# Patient Record
Sex: Female | Born: 1940 | Race: White | Hispanic: No | Smoking: Never smoker
Health system: Southern US, Community
[De-identification: ages and names within clinical notes are randomized; demographics above are authoritative.]

## PROBLEM LIST (undated history)

## (undated) DIAGNOSIS — E119 Type 2 diabetes mellitus without complications: Secondary | ICD-10-CM

## (undated) DIAGNOSIS — E785 Hyperlipidemia, unspecified: Secondary | ICD-10-CM

## (undated) DIAGNOSIS — I10 Essential (primary) hypertension: Secondary | ICD-10-CM

## (undated) DIAGNOSIS — I4891 Unspecified atrial fibrillation: Secondary | ICD-10-CM

---

## 1998-09-08 ENCOUNTER — Ambulatory Visit (HOSPITAL_COMMUNITY): Admission: RE | Admit: 1998-09-08 | Discharge: 1998-09-08 | Payer: Self-pay | Admitting: Gynecology

## 1998-09-08 ENCOUNTER — Other Ambulatory Visit: Admission: RE | Admit: 1998-09-08 | Discharge: 1998-09-08 | Payer: Self-pay | Admitting: Gynecology

## 2000-04-21 ENCOUNTER — Encounter: Payer: Self-pay | Admitting: Family Medicine

## 2000-04-21 ENCOUNTER — Ambulatory Visit (HOSPITAL_COMMUNITY): Admission: RE | Admit: 2000-04-21 | Discharge: 2000-04-21 | Payer: Self-pay | Admitting: Family Medicine

## 2000-05-04 ENCOUNTER — Other Ambulatory Visit: Admission: RE | Admit: 2000-05-04 | Discharge: 2000-05-04 | Payer: Self-pay | Admitting: Gynecology

## 2001-08-02 ENCOUNTER — Other Ambulatory Visit: Admission: RE | Admit: 2001-08-02 | Discharge: 2001-08-02 | Payer: Self-pay | Admitting: Gynecology

## 2002-09-30 ENCOUNTER — Other Ambulatory Visit: Admission: RE | Admit: 2002-09-30 | Discharge: 2002-09-30 | Payer: Self-pay | Admitting: Gynecology

## 2002-10-30 ENCOUNTER — Ambulatory Visit (HOSPITAL_COMMUNITY): Admission: RE | Admit: 2002-10-30 | Discharge: 2002-10-30 | Payer: Self-pay | Admitting: *Deleted

## 2003-10-06 ENCOUNTER — Other Ambulatory Visit: Admission: RE | Admit: 2003-10-06 | Discharge: 2003-10-06 | Payer: Self-pay | Admitting: Gynecology

## 2004-10-07 ENCOUNTER — Other Ambulatory Visit: Admission: RE | Admit: 2004-10-07 | Discharge: 2004-10-07 | Payer: Self-pay | Admitting: Gynecology

## 2005-01-10 ENCOUNTER — Ambulatory Visit (HOSPITAL_COMMUNITY): Admission: RE | Admit: 2005-01-10 | Discharge: 2005-01-10 | Payer: Self-pay | Admitting: Family Medicine

## 2005-01-22 ENCOUNTER — Inpatient Hospital Stay (HOSPITAL_COMMUNITY): Admission: EM | Admit: 2005-01-22 | Discharge: 2005-01-31 | Payer: Self-pay | Admitting: Emergency Medicine

## 2005-08-31 ENCOUNTER — Ambulatory Visit (HOSPITAL_COMMUNITY): Admission: RE | Admit: 2005-08-31 | Discharge: 2005-08-31 | Payer: Self-pay | Admitting: Family Medicine

## 2005-10-24 ENCOUNTER — Other Ambulatory Visit: Admission: RE | Admit: 2005-10-24 | Discharge: 2005-10-24 | Payer: Self-pay | Admitting: Gynecology

## 2006-10-27 ENCOUNTER — Ambulatory Visit (HOSPITAL_COMMUNITY): Admission: RE | Admit: 2006-10-27 | Discharge: 2006-10-28 | Payer: Self-pay | Admitting: Vascular Surgery

## 2007-02-13 ENCOUNTER — Ambulatory Visit: Payer: Self-pay | Admitting: Vascular Surgery

## 2007-02-26 ENCOUNTER — Ambulatory Visit: Payer: Self-pay | Admitting: Vascular Surgery

## 2007-03-06 ENCOUNTER — Ambulatory Visit: Payer: Self-pay | Admitting: Vascular Surgery

## 2007-07-03 ENCOUNTER — Ambulatory Visit: Payer: Self-pay | Admitting: Vascular Surgery

## 2007-07-16 ENCOUNTER — Ambulatory Visit: Payer: Self-pay | Admitting: Surgery

## 2007-07-16 ENCOUNTER — Emergency Department (HOSPITAL_COMMUNITY): Admission: EM | Admit: 2007-07-16 | Discharge: 2007-07-16 | Payer: Self-pay | Admitting: Emergency Medicine

## 2009-03-03 ENCOUNTER — Encounter: Admission: RE | Admit: 2009-03-03 | Discharge: 2009-03-03 | Payer: Self-pay | Admitting: Family Medicine

## 2010-06-18 ENCOUNTER — Encounter: Admission: RE | Admit: 2010-06-18 | Discharge: 2010-06-18 | Payer: Self-pay | Admitting: Family Medicine

## 2010-06-20 ENCOUNTER — Encounter: Admission: RE | Admit: 2010-06-20 | Discharge: 2010-06-20 | Payer: Self-pay | Admitting: Family Medicine

## 2010-08-02 ENCOUNTER — Ambulatory Visit: Payer: Self-pay | Admitting: Vascular Surgery

## 2010-09-13 ENCOUNTER — Ambulatory Visit: Payer: Self-pay | Admitting: Vascular Surgery

## 2010-11-08 ENCOUNTER — Ambulatory Visit
Admission: RE | Admit: 2010-11-08 | Discharge: 2010-11-08 | Payer: Self-pay | Source: Home / Self Care | Attending: Vascular Surgery | Admitting: Vascular Surgery

## 2010-11-09 NOTE — Assessment & Plan Note (Signed)
OFFICE VISIT  Carrie, Haynes DOB:  Jun 01, 1941                                       11/08/2010 ZOXWR#:[id number]  The patient returns today for continued followup regarding her severe recurrent venous insufficiency of the right leg.  She had laser ablation of her right great saphenous vein by me in 2008.  She has had a history of ligation and stripping of her left great saphenous vein the same year.  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  She continues to have progressive swelling as the day progresses and has severe thickening of the skin with chronic edema in the right leg.  Symptoms in the left leg have stabilized since her treatment in her leg.  CHRONIC MEDICAL PROBLEMS: 1. Diabetes. 2. Hyperlipidemia.  PHYSICAL EXAM:  Blood pressure is 156/85, heart rate 72, respirations 24.  General:  She is an obese female who is well and in no apparent distress.  She is alert and oriented x3.  HEENT:  Exam is normal for age.  EOMs intact.  Lungs:  Clear to auscultation.  No rhonchi or wheezing.  Abdomen:  Obese.  No palpable masses.  Lower extremity exam on the right reveals 3+ femoral, popliteal, and dorsalis pedis pulses palpable.  She continues to have bulging varicosities in the medial calf with severe thickening of the skin and 1 to 2+ edema.  No active ulcers.  Today, I reviewed the venous duplex exam which was performed in November revealing severe reflux at the right saphenofemoral junction extending down the great saphenous vein to the knee.  This was previously closed successfully with laser ablation and had recanalized in the last 4 years.  She has no DVT.  The right small saphenous vein is  competent.  This patient has failed medical management and these symptoms are definitely effecting her daily living.  I think we should treat the right great saphenous vein with laser ablation beginning at the knee level and extending proximally as far as it will proceed.  Because of severe tortuosity of the vein in the mid to proximal thigh, it may not traverse this with 1 sheath and that this may require a second sheath being inserted in the proximal thigh, and treatment with laser ablation in both areas.  We will proceed with precertification to perform this in the near future for this nice lady to hopefully stabilize her symptoms.    Quita Skye Hart [place], M.D. Electronically Signed  JDL/MEDQ  D:  11/08/2010  T:  11/09/2010  Job:  [id number]

## 2010-12-13 ENCOUNTER — Other Ambulatory Visit (INDEPENDENT_AMBULATORY_CARE_PROVIDER_SITE_OTHER): Payer: 59 | Admitting: Vascular Surgery

## 2010-12-13 DIAGNOSIS — I83893 Varicose veins of bilateral lower extremities with other complications: Secondary | ICD-10-CM

## 2010-12-14 ENCOUNTER — Other Ambulatory Visit: Payer: Self-pay | Admitting: Vascular Surgery

## 2010-12-14 NOTE — Assessment & Plan Note (Signed)
OFFICE VISIT  Carrie Haynes, Carrie Haynes DOB:  09-05-1941                                       12/13/2010 NWGNF#:[id number]  The patient underwent laser ablation of the right great saphenous vein in 2 specific areas today for painful varicosities with venous hypertension and severe skin changes in the right lower extremity.  She had a redundant great saphenous vein which had a straight segment below the knee extending up to the distal thigh and then another segment proximally.  These were closed with 2 separate laser treatments with 2 separate sheaths.  The laser ablation procedure went without difficulty. The first closure was from the mid to proximal thigh to the saphenofemoral junction, the second closure was the proximal calf to the mid to distal thigh.  The patient tolerated the procedure well.  Will return in 1 week for venous duplex exam to confirm closure.    Carrie Haynes, M.D. Electronically Signed  JDL/MEDQ  D:  12/13/2010  T:  12/14/2010  Job:  [id number]

## 2010-12-21 ENCOUNTER — Encounter (INDEPENDENT_AMBULATORY_CARE_PROVIDER_SITE_OTHER): Payer: 59

## 2010-12-21 ENCOUNTER — Ambulatory Visit (INDEPENDENT_AMBULATORY_CARE_PROVIDER_SITE_OTHER): Payer: 59 | Admitting: Vascular Surgery

## 2010-12-21 DIAGNOSIS — I83893 Varicose veins of bilateral lower extremities with other complications: Secondary | ICD-10-CM

## 2010-12-21 DIAGNOSIS — Z48812 Encounter for surgical aftercare following surgery on the circulatory system: Secondary | ICD-10-CM

## 2010-12-22 NOTE — Assessment & Plan Note (Signed)
OFFICE VISIT  TOSHIBA, NULL DOB:  03/17/1941                                       12/21/2010 QIONG#:[id number]  The patient returns 1 week post laser ablation of the right great saphenous vein with 2 different puncture sites.  Distally it was ablated from the proximal calf to the mid thigh where it became quite redundant. The second ablation was from the mid thigh to the saphenofemoral junction.  She had previously had laser ablation performed in 2008 and the vein had recanalized.  She had developed recurrent painful varicosities.  She has mild to moderate discomfort at the ablation site and has been taking her ibuprofen and wearing her stockings as instructed.  She has also elevated her leg as much as possible.  She has noted no change in the distal edema.  She denies any chest pain, dyspnea on exertion, PND, orthopnea, hemoptysis, chronic bronchitis, hemispheric or nonhemispheric TIAs, or amaurosis fugax.  PHYSICAL EXAMINATION:  Vital signs:  Blood pressure 130/74, heart rate 81, respirations 18.  General:  She is well-developed, well-nourished female who is in no apparent distress.  HEENT:  Normal for age.  Lungs: Clear to auscultation.  No rhonchi or wheezing.  Cardiovascular: Regular rhythm, no murmurs.  Carotid pulses 3+, no audible bruits. Abdomen:  Obese.  No palpable masses.  Lower extremities:  Exam on the right reveals 3+ femoral and 2+ dorsalis pedis pulse palpable.  She has tenderness along the course of the great saphenous vein from the proximal calf to the saphenofemoral junction with no blistering of the skin or cellulitis.  She continues to have some skin changes of chronic venous insufficiency with hyperpigmentation but no active ulcers.  Today I ordered a venous duplex exam which I reviewed and interpreted. Her GSV is totally occluded from the insertion site in the proximal calf up to the saphenofemoral junction.  There is no  evidence of DVT.  There is some reflux at the junction into the anterior accessory branch of the right great saphenous vein, but this reflux does not appear to be severe.  Her large primary great saphenous vein is totally occluded.  I reassured her regarding these findings.  She will continue to wear short-leg stockings after one more week of the long stocking and will return to see [place] on a p.r.n. basis for further symptoms.    Quita Skye Hart [place], M.D. Electronically Signed  JDL/MEDQ  D:  12/21/2010  T:  12/22/2010  Job:  [id number]

## 2010-12-29 NOTE — Procedures (Unsigned)
DUPLEX DEEP VENOUS EXAM - LOWER EXTREMITY  INDICATION:  Right greater saphenous vein laser ablation.  HISTORY:  Edema:  Yes. Trauma/Surgery:  Right greater saphenous vein laser ablation on 12/13/2010. Pain:  Yes. PE:  No. Previous DVT:  No. Anticoagulants: Other:  DUPLEX EXAM:               CFV   SFV   PopV  PTV    GSV               R  L  R  L  R  L  R   L  R  L Thrombosis    o  o  o     o     o      + Spontaneous   +  +  +     +     +      o Phasic        +  +  +     +     +      o Augmentation  +  +  +     +     +      D Compressible  +  +  +     +     +      P Competent     o  o  o     o            o  Legend:  + - yes  o - no  p - partial  D - decreased  IMPRESSION: 1. The tortuous right greater saphenous vein appears to be partially     occluded from the distal insertion sites to the proximal thigh     region.  No flow was noted in the proximal thigh greater saphenous     vein. 2. No evidence of deep venous thrombosis is noted in the right lower     extremity. 3. Reflux of >500 milliseconds is noted in the right saphenofemoral     junction, proximal thigh lateral accessory saphenous vein, and     throughout the right femoral-popliteal venous system.       _____________________________ Quita Skye Hart [place], M.D.  CH/MEDQ  D:  12/21/2010  T:  12/21/2010  Job:  [id number]

## 2011-03-01 NOTE — Procedures (Signed)
LOWER EXTREMITY VENOUS REFLUX EXAM   INDICATION:  History of varicose veins, pain in legs associated with  varicose veins.   EXAM:  Using color-flow imaging and pulse Doppler spectral analysis, the  right and left common femoral, superficial femoral, popliteal, posterior  tibial, greater and lesser saphenous veins were evaluated.  There is  evidence suggesting deep venous insufficiency in the right lower  extremity.   The right saphenofemoral junction is not competent with reflux of  >592milliseconds.  The right GSV has previously been removed but there  is revascularization and it is not competent with reflux of >500  milliseconds with calibers as described below.   The right and left proximal short saphenous veins demonstrate  competency.   GSV Diameter (used if found to be incompetent only)                                            Right    Left  Proximal Greater Saphenous Vein           1.07 cm  cm  Proximal-to-mid-thigh                     1.51 cm  cm  Mid thigh                                 0.70 cm  cm  Mid-distal thigh                          0.67 cm  cm  Distal thigh                              0.63 cm  cm  Knee                                      0.63 cm  cm   IMPRESSION:  1. The right greater saphenous vein has previously been stripped but      there is revascularization that is not competent with reflux of      >568milliseconds.  2. The right greater saphenous vein is tortuous.  3. The deep venous system is not competent with mild reflux noted in      the right leg.  4. The right and left short saphenous vein is competent.  5. The left greater saphenous vein has been previously closed off with      some mild revascularization not displaying reflux.        ___________________________________________  Quita Skye. Hart [place], M.D.   NT/MEDQ  D:  09/13/2010  T:  09/13/2010  Job:  [id number]

## 2011-03-01 NOTE — Assessment & Plan Note (Signed)
OFFICE VISIT   Carrie Haynes, Carrie Haynes  DOB:  [birth year]                                       07/03/2007  ZOXWR#:[id number]   The patient is now status post laser ablation to the right greater  saphenous vein with multiple stab phlebectomies performed in May of this  year.  She also had ligation and stripping of her left greater saphenous  vein with multiple stab phlebectomies in January of this year because of  aneurismal dilatation at the saphenofemoral junction.  She has had an  excellent result on both sides.  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  She has been on Coumadin since that time, which has now been  discontinued by Dr. Cliffton Asters since July of this year.  She states that her  legs feel much better with much less heaviness, aching, throbbing, and  obviously much less varicosities, which bulge and cause discomfort.  She  has had no stasis ulcers, but has continued to have 1+ edema.   EXAM:  Blood pressure 148/84, heart rate is 88, respirations 18.  There  are no large varicosities noted in either lower extremity.  She does  have severe hyperpigmentation beginning in the mid calf distally,  particularly medially, with thickening of the skin, and no active  ulcers.  There is 1+ edema distally.  She has 3+ dorsalis pedis pulses  bilaterally.   I have reassured her regarding these findings.  We did recommend that  she continue to wear short-leg elastic compression stockings on a  permanent basis because of her severe skin changes, and I agree with  discontinuing her Coumadin unless she has further thrombotic problems,  such as deep vein thrombosis or pulmonary emboli.  If that occurs, she  should be on chronic Coumadin indefinitely.   She will return to see [place] on a p.r.n. basis.   Quita Skye Hart [place], M.D.  Electronically Signed   JDL/MEDQ  D:  07/03/2007  T:  07/04/2007  Job:  [id number]   cc:   Stacie Acres. Cliffton Asters, M.D.

## 2011-03-01 NOTE — Assessment & Plan Note (Signed)
OFFICE VISIT   Carrie Haynes, POSTEL  DOB:  [birth year]                                       09/13/2010  ZOXWR#:[id number]   Patient returns today for further follow-up regarding her severe venous  insufficiency of both legs.  She had laser ablation of the right great  saphenous vein by me in 2008 and also had ligation stripping of the left  great saphenous vein in the same year.  She has some severe  hyperpigmentation and thickening of the skin with chronic edema of both  legs from years of venous hypertension.  She has been noted to have some  recurrent varicosities in the right leg and recurrent symptomatology in  the right thigh and calf and has been wearing elastic compression  stockings since I last saw her October 17 but continues to have these  symptoms which worsen as the day progresses, particularly in the right  leg.  She has been taking ibuprofen and elevating the leg as well.   Today I ordered a venous duplex exam which reveals gross reflux in the  right great saphenous vein through a very tortuous vein from the knee to  the saphenofemoral junction.  It is relatively straight over the  proximal third of the thigh.  The deep system has only mild reflux.   PHYSICAL EXAMINATION:  On exam she has some bulging varicosities in the  medial calf area with severe hyperpigmentation, thickening of the skin,  and chronic edema of the right leg.  She has 2+ dorsalis pedis pulse  palpable.   I think the best plan would be to repeat laser ablation of her right  great saphenous vein in the proximal third of the thigh to try to close  this vein up to the junction because there is severe venous hypertension  which persists.  If that is unsuccessful, then we will have to decide  whether to treat her with open ligation of the saphenofemoral junction  with proximal stripping.  We will have her return in 2 months and see  how her symptoms are doing with her long-leg  elastic compression  stockings, elevation, and ibuprofen.   Today her blood pressure is 132/78, heart rate is 98, respirations 20.     Quita Skye. Hart [place], M.D.  Electronically Signed   JDL/MEDQ  D:  09/13/2010  T:  09/13/2010  Job:  [id number]

## 2011-03-01 NOTE — Consult Note (Signed)
NEW PATIENT CONSULTATION   Carrie Haynes, Carrie Haynes  DOB:  1940/10/29                                       08/02/2010  ZOXWR#:[id number]   The patient is referred for venous insufficiency by Dr. Laurann Montana  today.  She is known to me from the past having performed ligation and  stripping of her left greater saphenous vein in early 2008 to be  followed later by laser ablation of her right great saphenous vein with  greater than 20 stab phlebectomies in the thigh and calf.  She now  returns with no severe pain in either lower extremity.  She states the  left leg is doing very well.  She does notice some discomfort in the  right leg as the day progresses.  She does have a few prominent  varicosities beginning at the knee extending down to the pretibial area  which were present after the ablation procedure on the right.  She has  had no venous stasis ulcers but does have persistent hyperpigmentation  in the lower legs although she states that the skin seems to be improved  over the last 3 years.  She has had no bleeding, thrombophlebitis or  deep vein thrombosis.   CHRONIC MEDICAL PROBLEMS:  1. Diabetes.  2. Hyperlipidemia.  3. Negative for coronary artery disease, hypertension, COPD or stroke.   SOCIAL HISTORY:  She is widowed, has one child, does not use tobacco or  alcohol.   FAMILY HISTORY:  Positive for cerebral hemorrhage in mother, diabetes in  a brother, breast cancer in sister.   REVIEW OF SYSTEMS:  Positive for weight gain, discomfort in the legs,  bronchitis, arthritis joint pain, all other systems were negative.   PHYSICAL EXAMINATION:  Vital signs:  Blood pressure 129/74, heart rate  93, respirations 24.  General:  She is a well-developed, well-nourished,  obese female in no apparent distress, alert and oriented x3.  HEENT:  Exam is normal for age.  EOMs intact.  Lungs:  Clear to auscultation.  No rhonchi or wheezing.  Cardiovascular:  Regular  rhythm.  No murmurs.  Carotid pulses 3+, no bruits.  Abdomen:  Obese.  No palpable masses.  Lower extremity exam reveals hyperpigmentation bilaterally with 1+  edema.  There are some varicosities in the right leg beginning at the  knee extending down the pretibial area.  The left leg is free of any  bulging varicosities.  There are no active ulcers or bleeding.  She has  3+ femoral, popliteal and dorsalis pedis pulses palpable bilaterally.   I looked with the SonoSite today in her venous system on the right.  It  does appear that she has some gross reflux at the saphenofemoral  junction with a patent great saphenous vein at least proximally in the  thigh.  She will return in 4-6 weeks for further evaluation with a  formal venous duplex exam of both legs to be performed in the vascular  lab and we can then discuss what the next step would be.     Quita Skye Hart [place], M.D.  Electronically Signed   JDL/MEDQ  D:  08/02/2010  T:  08/03/2010  Job:  [id number]

## 2011-03-04 NOTE — Discharge Summary (Signed)
NAMEGENEVIENE, TESCH NO.:  [id number]   MEDICAL RECORD NO.:  [id number]          PATIENT TYPE:  INP   LOCATION:  3729                         FACILITY:  [facility]   PHYSICIAN:  Deirdre Peer. Polite, M.D. DATE OF BIRTH:  02/02/1941   DATE OF ADMISSION:  01/21/2005  DATE OF DISCHARGE:  01/31/2005                                 DISCHARGE SUMMARY   DISCHARGE DIAGNOSES:  1.  Pulmonary embolism in the right lower lobe, right upper lobe pulmonary      artery.  2.  Obesity.   DISCHARGE MEDICATIONS:  Coumadin 10 mg alternating with 7.5 mg.  Further  dosing to be adjusted by primary M.D.   DISPOSITION:  Patient discharged home in stable condition with therapeutic  INR.   STUDIES:  Patient had a CT of the chest which showed pulmonary embolism in  the right middle lobe and right lower lobe.  Hypercoagulable panel was  ordered, pending at the time of this dictation.   HISTORY OF PRESENT ILLNESS:  A 70 year old female with history of  thrombophlebitis, superficial thrombosis, obesity presents for evaluation of  right-sided pain.  Patient had a CT of the chest which was positive for  acute PE.  Admission was deemed necessary for further evaluation and  treatment.  Please see dictated H&P for further details.   PAST MEDICAL HISTORY:  Per admission H&P.   MEDICATIONS ON ADMISSION:  Aspirin, Naprosyn, doxycycline.   ALLERGIES:  Reported allergy to ESTROGEN which causes clots.   SOCIAL HISTORY:  Negative for tobacco, alcohol, or drugs.   HOSPITAL COURSE:  Patient was admitted to the hospital for evaluation and  treatment of pulmonary embolism.  Patient was started on anticoagulation.  Laboratories were ordered as stated above and Coumadin was initiated per  pharmacy protocol.  Patient's INR was slow to rise.  However, upon being  greater than 2 patient was deemed stable for discharge to home.  Patient was  requested to follow up with primary M.D. for further evaluation of  pulmonary  embolism.       RDP/MEDQ  D:  04/06/2005  T:  04/06/2005  Job:  [id number]

## 2011-03-04 NOTE — Op Note (Signed)
[name], Carrie Haynes              ACCOUNT NO.:  [id number]   MEDICAL RECORD NO.:  [id number]          PATIENT TYPE:  AMB   LOCATION:  SDS                          FACILITY:  [facility]   PHYSICIAN:  Quita Skye. Hart [place], M.D.  DATE OF BIRTH:  May 20, 1941   DATE OF PROCEDURE:  10/27/2006  DATE OF DISCHARGE:                               OPERATIVE REPORT   PREOPERATIVE DIAGNOSIS:  Severe venous insufficiency, left leg, with  greater saphenous reflux and venous hypertension, asymptomatic.   POSTOPERATIVE DIAGNOSIS:  Severe venous insufficiency, left leg, with  greater saphenous reflux and venous hypertension, asymptomatic.   OPERATION:  1. Ligation and stripping, left greater saphenous vein.  2. Excision of multiple varicosities via stab phlebectomies (between      10 and 20) and ligation of greater saphenous vein distally in      midcalf.   SURGEON:  Quita Skye. Hart [place], M.D.   FIRST ASSISTANT:  Jerold Coombe, P.A.   ANESTHESIA:  General endotracheal.   PROCEDURE:  The patient was taken to the operating room, placed in the  upright position.  The severe varicosities in the left leg were marked  extending down the medial anterior thigh into the medial calf.  There  was severe hyperpigmentation and thickening of the skin distally.  Following this, after satisfactory general endotracheal anesthesia was  administered the left leg was prepped and draped in routine sterile  manner.  Oblique incision was made in the inguinal area.  Saphenous vein  was exposed up to the saphenofemoral junction.  The saphenous vein was  aneurysmal, about 2.5 cm in diameter, and was ligated flush at the  saphenofemoral junction, branches ligated with 3-0 silk ties and  divided.  The intraluminal stripper was passed distally, would go to  about the mid thigh area, where the vein became very tortuous.  Incision  was made in this point and the intraluminal stripper was secured and a  small stripper head being  placed in the inguinal area.  Short stab  phlebectomies were then performed over these multiple marked  varicosities.  Some of these in the midthigh had severe venous  hypertension despite ligation of the greater saphenous vein, and these  required transverse incisions to ligate the deep varicosities.  In  addition to these, multiple varicosities were avulsed.  A few short  incisions were made in the midcalf to ligate the greater saphenous vein.  A second segment of the vein was stripped from mid thigh to just below  the knee through 2 separate incisions.  Following multiple stab  phlebectomies, the two vein segments were stripped from proximal to  distal, compression  applied, and following hemostasis the wounds were all closed in layers  with Vicryl in a subcuticular fashion and with Steri-Strips for the  small stab phlebectomy wounds.  A sterile dressing was applied  consisting of 4x4s, Webril and an Ace wrap, and the patient taken to the  recovery room in satisfactory condition.           ______________________________  Quita Skye Hart [place], M.D.     JDL/MEDQ  D:  10/27/2006  T:  10/27/2006  Job:  [id number]

## 2011-03-04 NOTE — H&P (Signed)
Carrie Haynes, Carrie Haynes              ACCOUNT NO.:  [id number]   MEDICAL RECORD NO.:  [id number]          PATIENT TYPE:  INP   LOCATION:  1823                         FACILITY:  [facility]   PHYSICIAN:  Kela Millin, M.D.DATE OF BIRTH:  11/13/1940   DATE OF ADMISSION:  01/21/2005  DATE OF DISCHARGE:                                HISTORY & PHYSICAL   PRIMARY CARE PHYSICIAN:  Dr. Laurann Montana.   CHIEF COMPLAINT:  Right-sided pain/pleuritic pain.   HISTORY OF PRESENT ILLNESS:  The patient is a 70 year old obese white female  with history significant for thrombophlebitis/superficial thrombosis  diagnosed about four days ago per primary care physician, as well as a prior  history of superficial thrombosis about 10 years ago that was attributed to  estrogen, who presented with above complaints.  The patient reports a right-  sided pain that is worse with inspiration, as well as movement, x1-2 days.  She denies cough, fevers, diaphoresis, radiation.  She states that she tries  not to take a deep breath because it hurts to do so and so has had a sense  of difficulty breathing.   The patient was seen in the ER and a chest x-ray showed prominent central  pulmonary vasculature, a CT angiogram was done and it revealed acute  pulmonary emboli.  The patient is admitted to the [facility]  for further evaluation and management.  Carrie Haynes denies nausea, vomiting,  melena, PND, dysuria, and no diarrhea.  She states that she was seen by her  primary care physician about four days ago because of pain and swelling in  her left leg and lower extremity ultrasound was done and it showed  superficial clots/thrombophlebitis and she was started on Doxycycline as  well as Naprosyn.  She states that since then the swelling and pain in that  left leg have improved significantly.   PAST MEDICAL HISTORY:  As stated above.   MEDICATIONS:  1.  Aspirin.  2.  Naprosyn.  3.  Doxycycline.   ALLERGIES/ADVERSE REACTIONS:  ESTROGEN CAUSES CLOTS.   SOCIAL HISTORY:  She denies tobacco, occasional alcohol.   FAMILY HISTORY:  Her sister and brother have diabetes mellitus.  One of her  sisters has a stroke and also one of her sisters has had multiple myocardial  infarctions and the other has breast cancer.  She also has a brother with  open bleeding disorder.   REVIEW OF SYSTEMS:  As per HPI, other comprehensive review of systems  negative.   PHYSICAL EXAMINATION:  GENERAL:  The patient is morbidly obese, in no acute  respiratory distress.  VITAL SIGNS:  Temperature 98.8, blood pressure 142/64, pulse 88, O2  saturation 94%.  HEENT:  PERRL, EOMI, moist mucous membranes.  No oral exudates.  NECK:  Supple, no adenopathy, no thyromegaly, no JVD appreciated.  LUNGS:  The patient noted to be splinting, decreased breath sounds at bases,  no wheezes and no crackles appreciated.  CARDIOVASCULAR:  Regular rate and rhythm, normal S1, S2.  No murmurs  appreciated.  ABDOMEN:  Obese, bowel sounds present, nontender, nondistended and no masses  palpable, no organomegaly.  EXTREMITIES:  There is calf tenderness in the left lower extremity, also  palpable cord in the upper thigh medially.  There is hyperpigmentation noted  in both lower extremities and per patient this is chronic.  NEUROLOGIC:  Alert and oriented x3.  Cranial nerves II-XII grossly intact.  Nonfocal exam.   LABORATORY DATA:  Chest x-ray -- central pulmonary vascular prominence  noted, no acute infiltrates.  CT angiogram -- acute pulmonary emboli in the  right upper lobe and right lower lobe segmental branches.  Right middle lobe  and right lower lobe atelectasis versus infarction.  Her sodium is 135 with  a potassium of 4.3, chloride 104, BUN 12, glucose 121, pH 7.44, pcO2 39.4,  bicarb 27.2, CO2 28, hematocrit 39, hemoglobin 13.3, point of care markers  negative.  Her PT is 12, INR 0.9, PTT 22.   ASSESSMENT/PLAN:  1.   Pulmonary emboli -- we will obtain a hypercoagulable panel, start      anticoagulation -- Lovenox and Coumadin.  As already discussed above,      the patient recently diagnosed with thrombophlebitis/superficial      thrombosis and treated with nonsteroidal anti-inflammatories and      antibiotics.  We will follow.  2.  Elevated blood pressure -- no prior history of hypertension, the patient      currently in pain, we will monitor and treat as appropriate.  3.  Morbid obesity.      ACV/MEDQ  D:  01/22/2005  T:  01/22/2005  Job:  [id number]   cc:   Stacie Acres. White, M.D.  [address]  [place]  [place] [address]  Fax: [phone]

## 2011-03-04 NOTE — Discharge Summary (Signed)
NAMECAYCE, PASCHAL NO.:  [id number]   MEDICAL RECORD NO.:  [id number]          PATIENT TYPE:  INP   LOCATION:  3729                         FACILITY:  [facility]   PHYSICIAN:  Sherin Quarry, MD      DATE OF BIRTH:  [birth year]   DATE OF ADMISSION:  01/21/2005  DATE OF DISCHARGE:                                 DISCHARGE SUMMARY   HISTORY OF PRESENT ILLNESS:  Carrie Haynes is a 70 year old, morbidly obese  lady with a past history of superficial thrombophlebitis which had been  diagnosed four days prior to admission, as well as a history of superficial  thrombophlebitis diagnosed about years previously that had been attributed  to chronic estrogen therapy.  The patient initially presented on January 22, 2005, with right-sided pleuritic chest pain that had been present for about  two days.  There was no associated cough, fever, diaphoresis or radiation of  pain.  In the emergency room prior to being seen by our service, the patient  underwent a CT scan of the chest which showed an acute pulmonary embolus in  the right upper and lower lobe pulmonary artery branches.  Therefore,  admission was arranged.   PHYSICAL EXAMINATION:  Physical examination at the time of discharge as  described by Dr. Renold Don:  VITAL SIGNS:  The temperature was 98.8 degrees, blood pressure 142/64, pulse  88 and O2 saturation 94%.  HEENT:  Within normal limits.  CHEST:  Decreased breath sounds at the bases with no wheezing, rales or  rhonchi.  CARDIOVASCULAR:  Normal S1 and S2 without murmurs, rubs or gallops.  ABDOMEN:  Benign.  NEUROLOGIC:  Testing was normal.  EXTREMITIES:  Normal.   HOSPITAL COURSE:  In light of the diagnosis of pulmonary embolus, the  patient was started on Lovenox at a dose of 1 mg/kg every 12 hours.  Coumadin was initiated at the time of admission.  Both of these medications  were regulated as per pharmacy protocol.  The patient turned out to have a  rather large Coumadin  requirement which delayed the ultimate hospital  discharge.  During the last three days of the patient's hospitalization, she  received 12.5 mg on two days and 15 mg on one day.  Eventually on January 31, 2005, the INR was up to 2 and it was felt reasonable to discharge the  patient with close followup.   DISCHARGE DIAGNOSES:  1.  Right-sided pulmonary embolus.  2.  Transient hypertension identified on admission, but not present during      the course of the hospitalization.  3.  History of morbid obesity.  4.  Past history of thrombophlebitis.   DISCHARGE MEDICATIONS:  The patient will be instructed to take Coumadin 10  mg on Monday, 10 mg on Tuesday, 7.5 mg on Wednesday and then present to Dr.  Lucilla Lame office on Thursday for repeat prothrombin time.  I instructed her to  be sure to take her Coumadin in the evening so that the dose could be  adjusted depending on the prothrombin time result.  She is not currently  taking  any other medications with the exception of p.r.n. Tylenol.   FOLLOWUP:  She will follow up with Dr. Cliffton Asters.   SPECIAL INSTRUCTIONS:  Alterations in her diet were discussed, as well as  the need to avoid nonsteroidal anti-inflammatory drugs.   CONDITION AT THE TIME OF DISCHARGE:  Good.      SY/MEDQ  D:  01/31/2005  T:  01/31/2005  Job:  [id number]   cc:   Stacie Acres. White, M.D.  [address]  [place]  [place] [address]  Fax: [phone]

## 2011-03-04 NOTE — Letter (Signed)
September 27, 2010    Re:  Carrie Haynes, Carrie Haynes              DOB:  11-29-1940   To Whom It May Concern:   Carrie Haynes is a patient with severe venous insufficiency of both lower  extremities that has been treated by me for many years.  She has a  remote history of ligation stripping of her left great saphenous vein in  2008 with laser ablation of her right great saphenous vein and stab  phlebectomies with painful varicosities.  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  She is wearing long-leg  elastic compression stockings with no improve her symptoms.  She will  require either repeat laser ablation of her right great saphenous system  or surgical procedure in the operating room, including ligation and  stripping of her great saphenous vein.  Currently because of her edema  and pain, she is unable to stand at work.  I feel that she should be  allowed to perform her duties in the sitting position, at least until we  obtain a result which is satisfactory to allow her to stand at work.  Currently that is not feasible because of her symptomatology and lack of  relief of symptoms with long-leg elastic compression stockings.  She  will be returning to my office on 11/08/2010 to determine what will be  the next procedure for her to treat this severe venous hypertension in  the lower extremities.     Quita Skye Hart [place], M.D.  Electronically Signed   JDL/MEDQ  D:  09/27/2010  T:  09/27/2010  Job:  [id number]   cc:   Farris Has

## 2015-03-10 ENCOUNTER — Other Ambulatory Visit: Payer: Self-pay | Admitting: Orthopedic Surgery

## 2015-03-10 DIAGNOSIS — M25511 Pain in right shoulder: Secondary | ICD-10-CM

## 2015-03-27 ENCOUNTER — Ambulatory Visit
Admission: RE | Admit: 2015-03-27 | Discharge: 2015-03-27 | Disposition: A | Discharge status: Home / Self Care | Payer: Medicare Other | Source: Ambulatory Visit | Attending: Orthopedic Surgery | Admitting: Orthopedic Surgery

## 2015-03-27 DIAGNOSIS — M25511 Pain in right shoulder: Secondary | ICD-10-CM

## 2015-11-20 DIAGNOSIS — E119 Type 2 diabetes mellitus without complications: Secondary | ICD-10-CM | POA: Diagnosis not present

## 2015-11-20 DIAGNOSIS — H2589 Other age-related cataract: Secondary | ICD-10-CM | POA: Diagnosis not present

## 2015-11-20 DIAGNOSIS — H40013 Open angle with borderline findings, low risk, bilateral: Secondary | ICD-10-CM | POA: Diagnosis not present

## 2015-11-20 DIAGNOSIS — H2513 Age-related nuclear cataract, bilateral: Secondary | ICD-10-CM | POA: Diagnosis not present

## 2015-12-07 DIAGNOSIS — H2512 Age-related nuclear cataract, left eye: Secondary | ICD-10-CM | POA: Diagnosis not present

## 2015-12-07 DIAGNOSIS — H5703 Miosis: Secondary | ICD-10-CM | POA: Diagnosis not present

## 2016-06-02 DIAGNOSIS — K429 Umbilical hernia without obstruction or gangrene: Secondary | ICD-10-CM | POA: Diagnosis not present

## 2016-06-02 DIAGNOSIS — I129 Hypertensive chronic kidney disease with stage 1 through stage 4 chronic kidney disease, or unspecified chronic kidney disease: Secondary | ICD-10-CM | POA: Diagnosis not present

## 2016-06-02 DIAGNOSIS — D179 Benign lipomatous neoplasm, unspecified: Secondary | ICD-10-CM | POA: Diagnosis not present

## 2016-06-02 DIAGNOSIS — E1121 Type 2 diabetes mellitus with diabetic nephropathy: Secondary | ICD-10-CM | POA: Diagnosis not present

## 2016-06-02 DIAGNOSIS — N182 Chronic kidney disease, stage 2 (mild): Secondary | ICD-10-CM | POA: Diagnosis not present

## 2016-06-02 DIAGNOSIS — E785 Hyperlipidemia, unspecified: Secondary | ICD-10-CM | POA: Diagnosis not present

## 2016-06-30 DIAGNOSIS — Z23 Encounter for immunization: Secondary | ICD-10-CM | POA: Diagnosis not present

## 2016-07-22 DIAGNOSIS — E119 Type 2 diabetes mellitus without complications: Secondary | ICD-10-CM | POA: Diagnosis not present

## 2016-07-22 DIAGNOSIS — Z961 Presence of intraocular lens: Secondary | ICD-10-CM | POA: Diagnosis not present

## 2016-07-22 DIAGNOSIS — H40013 Open angle with borderline findings, low risk, bilateral: Secondary | ICD-10-CM | POA: Diagnosis not present

## 2016-07-22 DIAGNOSIS — H2589 Other age-related cataract: Secondary | ICD-10-CM | POA: Diagnosis not present

## 2016-07-22 DIAGNOSIS — H2511 Age-related nuclear cataract, right eye: Secondary | ICD-10-CM | POA: Diagnosis not present

## 2016-08-17 DIAGNOSIS — Z1231 Encounter for screening mammogram for malignant neoplasm of breast: Secondary | ICD-10-CM | POA: Diagnosis not present

## 2016-11-16 DIAGNOSIS — E559 Vitamin D deficiency, unspecified: Secondary | ICD-10-CM | POA: Diagnosis not present

## 2016-11-16 DIAGNOSIS — E1121 Type 2 diabetes mellitus with diabetic nephropathy: Secondary | ICD-10-CM | POA: Diagnosis not present

## 2016-11-16 DIAGNOSIS — N182 Chronic kidney disease, stage 2 (mild): Secondary | ICD-10-CM | POA: Diagnosis not present

## 2016-11-16 DIAGNOSIS — Z7984 Long term (current) use of oral hypoglycemic drugs: Secondary | ICD-10-CM | POA: Diagnosis not present

## 2016-11-16 DIAGNOSIS — N951 Menopausal and female climacteric states: Secondary | ICD-10-CM | POA: Diagnosis not present

## 2016-11-16 DIAGNOSIS — I129 Hypertensive chronic kidney disease with stage 1 through stage 4 chronic kidney disease, or unspecified chronic kidney disease: Secondary | ICD-10-CM | POA: Diagnosis not present

## 2016-11-16 DIAGNOSIS — E785 Hyperlipidemia, unspecified: Secondary | ICD-10-CM | POA: Diagnosis not present

## 2016-11-16 DIAGNOSIS — Z0000 Encounter for general adult medical examination without abnormal findings: Secondary | ICD-10-CM | POA: Diagnosis not present

## 2017-01-19 DIAGNOSIS — Z780 Asymptomatic menopausal state: Secondary | ICD-10-CM | POA: Diagnosis not present

## 2017-05-10 ENCOUNTER — Emergency Department (HOSPITAL_COMMUNITY): Payer: Medicare Other

## 2017-05-10 ENCOUNTER — Inpatient Hospital Stay (HOSPITAL_COMMUNITY)
Admission: EM | Admit: 2017-05-10 | Discharge: 2017-05-23 | DRG: 286 | Disposition: A | Discharge status: Home / Self Care | Payer: Medicare Other | Attending: Internal Medicine | Admitting: Internal Medicine

## 2017-05-10 ENCOUNTER — Encounter (HOSPITAL_COMMUNITY): Payer: Self-pay

## 2017-05-10 DIAGNOSIS — I361 Nonrheumatic tricuspid (valve) insufficiency: Secondary | ICD-10-CM | POA: Diagnosis not present

## 2017-05-10 DIAGNOSIS — I2510 Atherosclerotic heart disease of native coronary artery without angina pectoris: Secondary | ICD-10-CM | POA: Diagnosis not present

## 2017-05-10 DIAGNOSIS — I808 Phlebitis and thrombophlebitis of other sites: Secondary | ICD-10-CM | POA: Diagnosis not present

## 2017-05-10 DIAGNOSIS — I10 Essential (primary) hypertension: Secondary | ICD-10-CM | POA: Diagnosis not present

## 2017-05-10 DIAGNOSIS — I482 Chronic atrial fibrillation: Secondary | ICD-10-CM | POA: Diagnosis not present

## 2017-05-10 DIAGNOSIS — I959 Hypotension, unspecified: Secondary | ICD-10-CM

## 2017-05-10 DIAGNOSIS — Z6841 Body Mass Index (BMI) 40.0 and over, adult: Secondary | ICD-10-CM

## 2017-05-10 DIAGNOSIS — R0602 Shortness of breath: Secondary | ICD-10-CM | POA: Diagnosis not present

## 2017-05-10 DIAGNOSIS — I469 Cardiac arrest, cause unspecified: Secondary | ICD-10-CM

## 2017-05-10 DIAGNOSIS — R0601 Orthopnea: Secondary | ICD-10-CM | POA: Diagnosis not present

## 2017-05-10 DIAGNOSIS — R079 Chest pain, unspecified: Secondary | ICD-10-CM | POA: Diagnosis not present

## 2017-05-10 DIAGNOSIS — I4891 Unspecified atrial fibrillation: Secondary | ICD-10-CM | POA: Diagnosis present

## 2017-05-10 DIAGNOSIS — I5043 Acute on chronic combined systolic (congestive) and diastolic (congestive) heart failure: Secondary | ICD-10-CM | POA: Diagnosis not present

## 2017-05-10 DIAGNOSIS — R0609 Other forms of dyspnea: Secondary | ICD-10-CM

## 2017-05-10 DIAGNOSIS — I951 Orthostatic hypotension: Secondary | ICD-10-CM | POA: Diagnosis not present

## 2017-05-10 DIAGNOSIS — R0600 Dyspnea, unspecified: Secondary | ICD-10-CM

## 2017-05-10 DIAGNOSIS — I110 Hypertensive heart disease with heart failure: Secondary | ICD-10-CM | POA: Diagnosis not present

## 2017-05-10 DIAGNOSIS — I272 Pulmonary hypertension, unspecified: Secondary | ICD-10-CM | POA: Diagnosis not present

## 2017-05-10 DIAGNOSIS — E785 Hyperlipidemia, unspecified: Secondary | ICD-10-CM | POA: Diagnosis present

## 2017-05-10 DIAGNOSIS — Z86718 Personal history of other venous thrombosis and embolism: Secondary | ICD-10-CM

## 2017-05-10 DIAGNOSIS — Z86711 Personal history of pulmonary embolism: Secondary | ICD-10-CM

## 2017-05-10 DIAGNOSIS — Z8249 Family history of ischemic heart disease and other diseases of the circulatory system: Secondary | ICD-10-CM

## 2017-05-10 DIAGNOSIS — I420 Dilated cardiomyopathy: Secondary | ICD-10-CM

## 2017-05-10 DIAGNOSIS — Z7984 Long term (current) use of oral hypoglycemic drugs: Secondary | ICD-10-CM

## 2017-05-10 DIAGNOSIS — E119 Type 2 diabetes mellitus without complications: Secondary | ICD-10-CM | POA: Diagnosis not present

## 2017-05-10 DIAGNOSIS — I481 Persistent atrial fibrillation: Secondary | ICD-10-CM | POA: Diagnosis not present

## 2017-05-10 DIAGNOSIS — Z79899 Other long term (current) drug therapy: Secondary | ICD-10-CM | POA: Diagnosis not present

## 2017-05-10 DIAGNOSIS — I340 Nonrheumatic mitral (valve) insufficiency: Secondary | ICD-10-CM | POA: Diagnosis not present

## 2017-05-10 DIAGNOSIS — Z7982 Long term (current) use of aspirin: Secondary | ICD-10-CM

## 2017-05-10 HISTORY — DX: Hyperlipidemia, unspecified: E78.5

## 2017-05-10 HISTORY — DX: Type 2 diabetes mellitus without complications: E11.9

## 2017-05-10 HISTORY — DX: Essential (primary) hypertension: I10

## 2017-05-10 LAB — CBC
HCT: 32.5 % — ABNORMAL LOW (ref 36.0–46.0)
Hemoglobin: 10.6 g/dL — ABNORMAL LOW (ref 12.0–15.0)
MCH: 28.9 pg (ref 26.0–34.0)
MCHC: 32.6 g/dL (ref 30.0–36.0)
MCV: 88.6 fL (ref 78.0–100.0)
Platelets: 191 10*3/uL (ref 150–400)
RBC: 3.67 MIL/uL — ABNORMAL LOW (ref 3.87–5.11)
RDW: 14.1 % (ref 11.5–15.5)
WBC: 7.4 10*3/uL (ref 4.0–10.5)

## 2017-05-10 LAB — BASIC METABOLIC PANEL
Anion gap: 10 (ref 5–15)
BUN: 25 mg/dL — AB (ref 6–20)
CALCIUM: 8.9 mg/dL (ref 8.9–10.3)
CO2: 22 mmol/L (ref 22–32)
CREATININE: 0.95 mg/dL (ref 0.44–1.00)
Chloride: 102 mmol/L (ref 101–111)
GFR calc Af Amer: >60 mL/min (ref >60)
GFR, EST NON AFRICAN AMERICAN: 58 mL/min — AB (ref >60)
GLUCOSE: 139 mg/dL — AB (ref 65–99)
Potassium: 4.1 mmol/L (ref 3.5–5.1)
Sodium: 134 mmol/L — ABNORMAL LOW (ref 135–145)

## 2017-05-10 LAB — MRSA PCR SCREENING: MRSA by PCR: NEGATIVE

## 2017-05-10 LAB — HEPARIN LEVEL (UNFRACTIONATED): Heparin Unfractionated: 0.45 IU/mL (ref 0.30–0.70)

## 2017-05-10 LAB — I-STAT TROPONIN, ED: TROPONIN I, POC: 0.01 ng/mL (ref 0.00–0.08)

## 2017-05-10 LAB — MAGNESIUM: Magnesium: 1.5 mg/dL — ABNORMAL LOW (ref 1.7–2.4)

## 2017-05-10 LAB — TROPONIN I

## 2017-05-10 LAB — GLUCOSE, CAPILLARY: Glucose-Capillary: 160 mg/dL — ABNORMAL HIGH (ref 65–99)

## 2017-05-10 LAB — TSH: TSH: 1.703 u[IU]/mL (ref 0.350–4.500)

## 2017-05-10 LAB — BRAIN NATRIURETIC PEPTIDE: B Natriuretic Peptide: 115.2 pg/mL — ABNORMAL HIGH (ref 0.0–100.0)

## 2017-05-10 MED ORDER — ATORVASTATIN CALCIUM 20 MG PO TABS
20.0000 mg | ORAL_TABLET | Freq: Every evening | ORAL | Status: DC
  Administered 2017-05-10 – 2017-05-22 (×13): 20 mg via ORAL
  Filled 2017-05-10 (×13): qty 1

## 2017-05-10 MED ORDER — ONDANSETRON HCL 4 MG/2ML IJ SOLN: 4.0000 mg | Freq: Four times a day (QID) | INTRAMUSCULAR | Status: DC | PRN

## 2017-05-10 MED ORDER — MAGNESIUM SULFATE 2 GM/50ML IV SOLN
2.0000 g | Freq: Once | INTRAVENOUS | Status: AC
  Administered 2017-05-10: 2 g via INTRAVENOUS
  Filled 2017-05-10: qty 50

## 2017-05-10 MED ORDER — FUROSEMIDE 10 MG/ML IJ SOLN
80.0000 mg | Freq: Once | INTRAMUSCULAR | Status: AC
  Administered 2017-05-10: 80 mg via INTRAVENOUS
  Filled 2017-05-10: qty 8

## 2017-05-10 MED ORDER — SODIUM CHLORIDE 0.9 % IV BOLUS (SEPSIS)
1000.0000 mL | Freq: Once | INTRAVENOUS | Status: AC
  Administered 2017-05-10: 1000 mL via INTRAVENOUS

## 2017-05-10 MED ORDER — IOPAMIDOL (ISOVUE-370) INJECTION 76%
INTRAVENOUS | Status: AC
Start: 2017-05-10 — End: 2017-05-10
  Administered 2017-05-10: 100 mL
  Filled 2017-05-10: qty 100

## 2017-05-10 MED ORDER — DILTIAZEM HCL 100 MG IV SOLR
5.0000 mg/h | INTRAVENOUS | Status: DC
  Administered 2017-05-10 – 2017-05-11 (×2): 5 mg/h via INTRAVENOUS
  Filled 2017-05-10 (×2): qty 100

## 2017-05-10 MED ORDER — HEPARIN (PORCINE) IN NACL 100-0.45 UNIT/ML-% IJ SOLN
1100.0000 [IU]/h | INTRAMUSCULAR | Status: DC
  Administered 2017-05-10: 1100 [IU]/h via INTRAVENOUS
  Filled 2017-05-10 (×3): qty 250

## 2017-05-10 MED ORDER — ACETAMINOPHEN 325 MG PO TABS: 650.0000 mg | ORAL_TABLET | ORAL | Status: DC | PRN

## 2017-05-10 MED ORDER — DILTIAZEM LOAD VIA INFUSION
10.0000 mg | Freq: Once | INTRAVENOUS | Status: AC
  Administered 2017-05-10: 10 mg via INTRAVENOUS
  Filled 2017-05-10: qty 10

## 2017-05-10 MED ORDER — HEPARIN BOLUS VIA INFUSION
4000.0000 [IU] | Freq: Once | INTRAVENOUS | Status: AC
  Administered 2017-05-10: 4000 [IU] via INTRAVENOUS
  Filled 2017-05-10: qty 4000

## 2017-05-10 MED ORDER — INSULIN ASPART 100 UNIT/ML ~~LOC~~ SOLN
0.0000 [IU] | Freq: Three times a day (TID) | SUBCUTANEOUS | Status: DC
  Administered 2017-05-11 (×2): 3 [IU] via SUBCUTANEOUS
  Administered 2017-05-11 – 2017-05-12 (×2): 2 [IU] via SUBCUTANEOUS
  Administered 2017-05-13 – 2017-05-14 (×2): 3 [IU] via SUBCUTANEOUS
  Administered 2017-05-15: 2 [IU] via SUBCUTANEOUS
  Administered 2017-05-15: 3 [IU] via SUBCUTANEOUS
  Administered 2017-05-16: 2 [IU] via SUBCUTANEOUS
  Administered 2017-05-16 – 2017-05-17 (×2): 3 [IU] via SUBCUTANEOUS
  Administered 2017-05-17 – 2017-05-18 (×2): 5 [IU] via SUBCUTANEOUS
  Administered 2017-05-19: 2 [IU] via SUBCUTANEOUS
  Administered 2017-05-19: 5 [IU] via SUBCUTANEOUS
  Administered 2017-05-19: 3 [IU] via SUBCUTANEOUS
  Administered 2017-05-20: 2 [IU] via SUBCUTANEOUS
  Administered 2017-05-20: 5 [IU] via SUBCUTANEOUS
  Administered 2017-05-21 – 2017-05-22 (×6): 2 [IU] via SUBCUTANEOUS
  Administered 2017-05-23: 8 [IU] via SUBCUTANEOUS

## 2017-05-10 NOTE — Progress Notes (Signed)
ANTICOAGULATION CONSULT NOTE - Follow Up Consult  Pharmacy Consult for heparin Indication: atrial fibrillation  Labs:  Recent Labs  05/10/17 1235 05/10/17 2033 05/10/17 2211  HGB 10.6*  --   --   HCT 32.5*  --   --   PLT 191  --   --   HEPARINUNFRC  --   --  0.45  CREATININE 0.95  --   --   TROPONINI  --  <0.03  --     Assessment/Plan:  76yo female therapeutic on heparin with initial dosing for Afib. Will continue gtt at current rate and confirm stable with additional level.   Vernard Gambles, PharmD, BCPS  05/10/2017,11:11 PM

## 2017-05-10 NOTE — ED Provider Notes (Signed)
[facility]EMERGENCY DEPT Provider Note   CSN: [id number] Arrival date & time: 05/10/17  1222     History   Chief Complaint Chief Complaint  Patient presents with  . Chest Pain    HPI Carrie Haynes is a 76 y.o. female with a past medical history significant for T2DM, hyperlipidemia, hypertension, DVT and PE who presents here today with chest pain and difficulty breathing. Patient reports that symptoms started two days ago when she started to experience dyspnea and sharp substernal chest pain radiating to her back. Patient also endorse some shortness of breath and have had a hard time catching her breath. Patient unable to speak in full sentence. Patient denies any nausea, vomiting, abdominal pain, dizziness, headache, leg swelling and palpitations. On arrival to the ED patient was found to be in afib with RVR and hypotensive with a BP of 96/70 and HR>110.   HPI  Past Medical History:  Diagnosis Date  . Diabetes mellitus without complication (HCC)   . Hyperlipemia   . Hypertension     There are no active problems to display for this patient.   History reviewed. No pertinent surgical history.  OB History    No data available       Home Medications    Prior to Admission medications   Medication Sig Start Date End Date Taking? Authorizing Provider  aspirin 325 MG tablet Take 325 mg by mouth daily.   Yes [provider]  atorvastatin (LIPITOR) 20 MG tablet Take 20 mg by mouth every evening. 05/03/17  Yes [provider]  Cholecalciferol (VITAMIN D-3) 5000 units TABS Take 5,000 Units by mouth daily.   Yes [provider]  losartan (COZAAR) 25 MG tablet Take 25 mg by mouth at bedtime. 03/08/17  Yes [provider]  metFORMIN (GLUCOPHAGE-XR) 500 MG 24 hr tablet Take 2,000 mg by mouth at bedtime.   Yes [provider]    Family History No family history on file.  Social History Social History  Substance Use Topics  . Smoking  status: Never Smoker  . Smokeless tobacco: Never Used  . Alcohol use No     Allergies   Patient has no known allergies.   Review of Systems Review of Systems  Constitutional: Negative.   HENT: Negative.   Eyes: Negative.   Respiratory: Positive for shortness of breath.   Cardiovascular: Positive for chest pain, palpitations and leg swelling.  Gastrointestinal: Negative.   Endocrine: Negative.   Genitourinary: Negative.   Musculoskeletal: Negative.   Skin: Negative.   Allergic/Immunologic: Negative.   Neurological: Negative.   Hematological: Negative.   Psychiatric/Behavioral: Negative.      Physical Exam Updated Vital Signs BP (!) 114/46   Pulse 100   Temp 98.1 F (36.7 C) (Oral)   Resp (!) 25   Ht 5\' 1"  (1.549 m)   Wt 99.8 kg (220 lb)   SpO2 98%   BMI 41.57 kg/m   Physical Exam  Constitutional: She is oriented to person, place, and time. She appears well-developed.  HENT:  Head: Normocephalic and atraumatic.  Eyes: Pupils are equal, round, and reactive to light. Conjunctivae and EOM are normal.  Neck: Normal range of motion. Neck supple.  Cardiovascular: Normal rate, regular rhythm and normal heart sounds.   Pulmonary/Chest: Effort normal.  Bilateral lower lung bases crackles noted on exam  Abdominal: Soft. Bowel sounds are normal.  Musculoskeletal: Normal range of motion.  Neurological: She is alert and oriented to person, place, and time.  Skin: Skin is warm and dry.  Psychiatric: She has a normal mood and affect. Her behavior is normal.     ED Treatments / Results  Labs (all labs ordered are listed, but only abnormal results are displayed) Labs Reviewed  BASIC METABOLIC PANEL - Abnormal; Notable for the following:       Result Value   Sodium 134 (*)    Glucose, Bld 139 (*)    BUN 25 (*)    GFR calc non Af Amer 58 (*)    All other components within normal limits  CBC - Abnormal; Notable for the following:    RBC 3.67 (*)    Hemoglobin 10.6  (*)    HCT 32.5 (*)    All other components within normal limits  MAGNESIUM - Abnormal; Notable for the following:    Magnesium 1.5 (*)    All other components within normal limits  BRAIN NATRIURETIC PEPTIDE - Abnormal; Notable for the following:    B Natriuretic Peptide 115.2 (*)    All other components within normal limits  HEPARIN LEVEL (UNFRACTIONATED)  I-STAT TROPONIN, ED    EKG  EKG Interpretation  Date/Time:  Wednesday May 10 2017 12:29:58 EDT Ventricular Rate:  139 PR Interval:    QRS Duration: 82 QT Interval:  264 QTC Calculation: 401 R Axis:   -63 Text Interpretation:  Atrial fibrillation with rapid ventricular response with premature ventricular or aberrantly conducted complexes Left axis deviation Anterior infarct , age undetermined Abnormal ECG Since last EKG, atrial fibrillation with RVR is now present PVCs now present Confirmed by Shaune Pollack [phone]) on 05/10/2017 12:39:13 PM       Radiology Ct Angio Chest Pe W And/or Wo Contrast  Result Date: 05/10/2017 CLINICAL DATA:  Chest pain short of breath. History of DVT and pulmonary embolism EXAM: CT ANGIOGRAPHY CHEST WITH CONTRAST TECHNIQUE: Multidetector CT imaging of the chest was performed using the standard protocol during bolus administration of intravenous contrast. Multiplanar CT image reconstructions and MIPs were obtained to evaluate the vascular anatomy. CONTRAST:  100 mL Isovue 370 IV COMPARISON:  CT chest 03/03/2009 FINDINGS: Cardiovascular: Pulmonary arteries are enlarged suggesting pulmonary artery hypertension. Enlargement of the right atrium and right ventricle. Negative for pulmonary embolism. Atherosclerotic calcification in the aortic arch on left coronary artery. Negative for aortic aneurysm. Mediastinum/Nodes: Negative for mass or adenopathy. Lungs/Pleura: Hypoventilation with mild atelectasis in the lung bases. Negative for infiltrate effusion or lung mass Upper Abdomen: Negative Musculoskeletal:  Negative Review of the MIP images confirms the above findings. IMPRESSION: Negative for pulmonary embolism Pulmonary artery hypertension Mild atelectasis in the lung bases. Electronically Signed   By: Marlan Palau M.D.   On: 05/10/2017 14:00   Dg Chest Portable 1 View  Result Date: 05/10/2017 CLINICAL DATA:  Chest pain and shortness of breath for 2 days EXAM: PORTABLE CHEST 1 VIEW COMPARISON:  10/25/2006 FINDINGS: Cardiac shadow is enlarged in size. Aortic calcifications are noted. The lungs are well aerated bilaterally. No focal infiltrate or sizable effusion is seen. No bony abnormality is noted. IMPRESSION: No active disease. Electronically Signed   By: Alcide Clever M.D.   On: 05/10/2017 13:42    Procedures Procedures (including critical care time)  Medications Ordered in ED Medications  heparin ADULT infusion 100 units/mL (25000 units/212mL sodium chloride 0.45%) (1,100 Units/hr Intravenous New Bag/Given 05/10/17 1409)  diltiazem (CARDIZEM) 1 mg/mL load via infusion 10 mg (10 mg Intravenous Bolus from Bag 05/10/17 1517)    And  diltiazem (CARDIZEM)  100 mg in dextrose 5 % 100 mL (1 mg/mL) infusion (7.5 mg/hr Intravenous Rate/Dose Change 05/10/17 1611)  magnesium sulfate IVPB 2 g 50 mL (not administered)  sodium chloride 0.9 % bolus 1,000 mL (0 mLs Intravenous Stopped 05/10/17 1517)  iopamidol (ISOVUE-370) 76 % injection (100 mLs  Contrast Given 05/10/17 1343)  heparin bolus via infusion 4,000 Units (4,000 Units Intravenous Bolus from Bag 05/10/17 1410)     Initial Impression / Assessment and Plan / ED Course  I have reviewed the triage vital signs and the nursing notes.  Pertinent labs & imaging results that were available during my care of the patient were reviewed by me and considered in my medical decision making (see chart for details).   Patient is a 76 yo female who presented with chest pain and dyspnea for the past two days. On arrival to the ED, patient was found to be hypotensive,  tachycardic with new onset atrial fibrillation. Given history of PE and DVT, there was a high suspicion for PE Patient received IVF prior to Heparin drip with minimal changes in vitals. Patient was started on Heparin drip and CTA was negative for PE but showed pulmonary hypertension. BNP is slighlty elevated at 115.2 and troponin is negative. CXR was negative for any acute process. Patient with bilateral crackles at the bases, but no signs of PNA or lung disease. Patient was started on Diltiazem drip  With significant improvement in BP and HR.. Cardiology was consulted for admission. Discussed with patient treatment plan who is in agreement.  Final Clinical Impressions(s) / ED Diagnoses   Final diagnoses:  Atrial fibrillation, unspecified type [facility])    New Prescriptions New Prescriptions   No medications on file     Lovena Neighbours, MD 05/10/17 1618    Shaune Pollack, MD 05/12/17 1113    Shaune Pollack, MD 05/12/17 1113

## 2017-05-10 NOTE — Progress Notes (Signed)
ANTICOAGULATION CONSULT NOTE - Initial Consult  Pharmacy Consult for heparin Indication: atrial fibrillation and pulmonary embolus  No Known Allergies  Patient Measurements: Height: 5\' 1"  (154.9 cm) Weight: 220 lb (99.8 kg) IBW/kg (Calculated) : 47.8 Heparin Dosing Weight: 71.8 kg  Vital Signs: Temp: 98.1 F (36.7 C) (07/25 1233) Temp Source: Oral (07/25 1233) BP: 100/65 (07/25 1300) Pulse Rate: 140 (07/25 1300)  Labs:  Recent Labs  05/10/17 1235  HGB 10.6*  HCT 32.5*  PLT 191  CREATININE 0.95    Estimated Creatinine Clearance: 57.1 mL/min (by C-G formula based on SCr of 0.95 mg/dL).   Medical History: Past Medical History:  Diagnosis Date  . Diabetes mellitus without complication (HCC)   . Hyperlipemia   . Hypertension     Medications:  Infusions:  . heparin      Assessment: 76 yo female presenting to ED with chest pain, SOB x 2 days. Patient has suspected PE and hx of afib. Hgb/Hct low, plts wnl. No anticoag PTA.  Goal of Therapy:  Heparin level 0.3-0.7 units/ml Monitor platelets by anticoagulation protocol: Yes   Plan:  Give 4000 units bolus x 1 Start heparin infusion at 1100 units/hr Check anti-Xa level in 8 hours and daily while on heparin Continue to monitor H&H and platelets  Ladell Pier, PharmD Pharmacy Resident Pager: [phone] 05/10/2017 1:52 PM

## 2017-05-10 NOTE — H&P (Addendum)
H & P    Patient ID: EARL ZELLMER MRN: [id number], DOB/AGE: [birth year]   Admit date: 05/10/2017 Date of Consult: 05/10/2017  Primary Physician: Laurann Montana, MD Primary Cardiologist: New Requesting Provider: Issacs Reason for Consultation: Afib  Carrie Haynes is a 76 y.o. female who is being seen today for the evaluation of Afib at the request of Dr. Penne Lash.  Patient Profile    76 yo female with PMH of NIDDM, HTN, and HL who presented with chest pain and dyspnea.   Past Medical History   Past Medical History:  Diagnosis Date  . Diabetes mellitus without complication (HCC)   . Hyperlipemia   . Hypertension     History reviewed. No pertinent surgical history.   Allergies  No Known Allergies  History of Present Illness    Carrie Haynes is a 76 yo female with PMH of NIDDM, HTN, and HL. Reports she is followed by her PCP for chronic issues. Family hx of CAD with half sister having an MI.  Reports she has felt short of breath over the past 6 months, but recently worsened. States 2 nights ago she developed worsening dyspnea, and orthopnea.Complains of SSCP Felt like a stabbing sensation in her chest with deep inspiration  Also in between shoulder blades  Again, pleuritic . 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 Was on coumadin afterwards, and then taken off. States she had another PE afterwards, and on coumadin again. This was many years ago per her report. Pt has also had some episodes of dizziness over the past couple of weeks, mostly with walking.  Flet like she had to hold herself up with wall   Also reports some palpitations prior to admission.   Presented to the ED with worsening dyspnea today. She was started on IV heparin given concern for possible PE. CTA was negative for PE.but showed evid to support pulmonary hypertension.   Labs showed stable electrolytes, BNP 115, Hgb 10.6, I stat Trop 0.01, Mag 1.5.  CXR negative for edema. EKG showed new onset Afib RVR. Placed on IV dilt and rate improved from 140s to 100s. Given 2g IV mag in the ED.   Inpatient Medications      Family History    Family History  Problem Relation Age of Onset  . Hypertension Father   . Heart attack Sister     Social History    Social History   Social History  . Marital status: Widowed    Spouse name: N/A  . Number of children: N/A  . Years of education: N/A   Occupational History  . Not on file.   Social History Main Topics  . Smoking status: Never Smoker  . Smokeless tobacco: Never Used  . Alcohol use No  . Drug use: No  . Sexual activity: Not on file   Other Topics Concern  . Not on file   Social History Narrative  . No narrative on file     Review of Systems    See HPI  All other systems reviewed and are otherwise negative except as noted above.  Physical Exam    Blood pressure (!) 114/46, pulse 100, temperature 98.1 F (36.7 C), temperature source Oral, resp. rate (!) 25, height 5\' 1"  (1.549 m), weight 220 lb (99.8 kg), SpO2 98 %.  General: Pleasant, obese WF NAD Psych: Normal affect. Neuro: Alert and oriented X 3. Moves all  extremities spontaneously. HEENT: Normal  Neck: Supple without bruits, + JVD. Lungs:  Resp regular and slightly labored, diminished bilaterally. Heart: RRR no s3, s4, 2/6 systolic murmurs. Abdomen: Soft, non-tender, non-distended, BS + x 4.  Extremities: No clubbing, cyanosis mild LE edema. DP/PT/Radials 1+ and equal bilaterally.  Labs    Troponin [facility] of Care Test)  Recent Labs  05/10/17 1248  TROPIPOC 0.01   No results for input(s): CKTOTAL, CKMB, TROPONINI in the last 72 hours. Lab Results  Component Value Date   WBC 7.4 05/10/2017   HGB 10.6 (L) 05/10/2017   HCT 32.5 (L) 05/10/2017   MCV 88.6 05/10/2017   PLT 191 05/10/2017     Recent Labs Lab 05/10/17 1235  NA 134*  K 4.1  CL 102  CO2 22  BUN 25*  CREATININE 0.95  CALCIUM 8.9    GLUCOSE 139*   No results found for: CHOL, HDL, LDLCALC, TRIG No results found for: [facility]   Radiology Studies    Ct Angio Chest Pe W And/or Wo Contrast  Result Date: 05/10/2017 CLINICAL DATA:  Chest pain short of breath. History of DVT and pulmonary embolism EXAM: CT ANGIOGRAPHY CHEST WITH CONTRAST TECHNIQUE: Multidetector CT imaging of the chest was performed using the standard protocol during bolus administration of intravenous contrast. Multiplanar CT image reconstructions and MIPs were obtained to evaluate the vascular anatomy. CONTRAST:  100 mL Isovue 370 IV COMPARISON:  CT chest 03/03/2009 FINDINGS: Cardiovascular: Pulmonary arteries are enlarged suggesting pulmonary artery hypertension. Enlargement of the right atrium and right ventricle. Negative for pulmonary embolism. Atherosclerotic calcification in the aortic arch on left coronary artery. Negative for aortic aneurysm. Mediastinum/Nodes: Negative for mass or adenopathy. Lungs/Pleura: Hypoventilation with mild atelectasis in the lung bases. Negative for infiltrate effusion or lung mass Upper Abdomen: Negative Musculoskeletal: Negative Review of the MIP images confirms the above findings. IMPRESSION: Negative for pulmonary embolism Pulmonary artery hypertension Mild atelectasis in the lung bases. Electronically Signed   By: Marlan Palau M.D.   On: 05/10/2017 14:00   Dg Chest Portable 1 View  Result Date: 05/10/2017 CLINICAL DATA:  Chest pain and shortness of breath for 2 days EXAM: PORTABLE CHEST 1 VIEW COMPARISON:  10/25/2006 FINDINGS: Cardiac shadow is enlarged in size. Aortic calcifications are noted. The lungs are well aerated bilaterally. No focal infiltrate or sizable effusion is seen. No bony abnormality is noted. IMPRESSION: No active disease. Electronically Signed   By: Alcide Clever M.D.   On: 05/10/2017 13:42    ECG & Cardiac Imaging    EKG: AFib RVR  Low voltage  Possible anterior MI    Assessment & Plan    76 yo female  with PMH od DM, HTN, and HL who presented with chest pain and dyspnea.  CP is pleuritic, somewhat positional SOB iw concerning    Pt in afib with RVR on arrival  This has improved some with IV diltiazem On exam, evid of some volume increase  Increased P2 consistent with pulmonary HTN  1  Atrial fib   Plan to Admit to telemetry  Continue IV heparin and IV dilt for now for atrial fib and rate control  Get TSH  Echo to evaluate 2 Dyspnea  WIll give lasix once  Follow response  Repeat labs in AM  Echo in AM to assess LV and RV function   Get estimate for PAP   3  DM  Continue meds    4.  Hx HTN  Hold cozaar  Use  dilt for now.   Laverda Page, NP-C Pager [phone] 05/10/2017, 4:34 PM

## 2017-05-10 NOTE — ED Triage Notes (Signed)
Pt presents to the ed with complaints of chest pain, shortness of breath for 2 days. The patient has a history of DVT and PE. EDP called for ct order in triage. No apparent distress in triage

## 2017-05-10 NOTE — ED Notes (Signed)
Attempted report x 1; name and call back number provided 

## 2017-05-10 NOTE — ED Notes (Signed)
Patient transported to CT 

## 2017-05-10 NOTE — ED Notes (Signed)
Per dr Erma Heritage pt needs to come back to room prior to ordering cta to stabalize

## 2017-05-11 ENCOUNTER — Observation Stay (HOSPITAL_BASED_OUTPATIENT_CLINIC_OR_DEPARTMENT_OTHER): Payer: Medicare Other

## 2017-05-11 DIAGNOSIS — I361 Nonrheumatic tricuspid (valve) insufficiency: Secondary | ICD-10-CM

## 2017-05-11 DIAGNOSIS — I469 Cardiac arrest, cause unspecified: Secondary | ICD-10-CM | POA: Diagnosis not present

## 2017-05-11 DIAGNOSIS — I5043 Acute on chronic combined systolic (congestive) and diastolic (congestive) heart failure: Secondary | ICD-10-CM | POA: Diagnosis not present

## 2017-05-11 DIAGNOSIS — I481 Persistent atrial fibrillation: Secondary | ICD-10-CM | POA: Diagnosis not present

## 2017-05-11 DIAGNOSIS — Z6841 Body Mass Index (BMI) 40.0 and over, adult: Secondary | ICD-10-CM | POA: Diagnosis not present

## 2017-05-11 DIAGNOSIS — I4891 Unspecified atrial fibrillation: Secondary | ICD-10-CM | POA: Diagnosis not present

## 2017-05-11 DIAGNOSIS — I482 Chronic atrial fibrillation: Secondary | ICD-10-CM | POA: Diagnosis not present

## 2017-05-11 LAB — TROPONIN I
Troponin I: <0.03 ng/mL (ref <0.03)
Troponin I: <0.03 ng/mL (ref <0.03)

## 2017-05-11 LAB — GLUCOSE, CAPILLARY
GLUCOSE-CAPILLARY: 127 mg/dL — AB (ref 65–99)
GLUCOSE-CAPILLARY: 161 mg/dL — AB (ref 65–99)
GLUCOSE-CAPILLARY: 157 mg/dL — AB (ref 65–99)
Glucose-Capillary: 103 mg/dL — ABNORMAL HIGH (ref 65–99)

## 2017-05-11 LAB — CBC
HEMATOCRIT: 31.1 % — AB (ref 36.0–46.0)
HEMOGLOBIN: 10.3 g/dL — AB (ref 12.0–15.0)
MCH: 29.4 pg (ref 26.0–34.0)
MCHC: 33.1 g/dL (ref 30.0–36.0)
MCV: 88.9 fL (ref 78.0–100.0)
Platelets: 168 10*3/uL (ref 150–400)
RBC: 3.5 MIL/uL — ABNORMAL LOW (ref 3.87–5.11)
RDW: 14.2 % (ref 11.5–15.5)
WBC: 7 10*3/uL (ref 4.0–10.5)

## 2017-05-11 LAB — BASIC METABOLIC PANEL
ANION GAP: 8 (ref 5–15)
BUN: 26 mg/dL — AB (ref 6–20)
CO2: 25 mmol/L (ref 22–32)
CREATININE: 1 mg/dL (ref 0.44–1.00)
Calcium: 8.2 mg/dL — ABNORMAL LOW (ref 8.9–10.3)
Chloride: 100 mmol/L — ABNORMAL LOW (ref 101–111)
GFR calc non Af Amer: 55 mL/min — ABNORMAL LOW (ref >60)
Glucose, Bld: 115 mg/dL — ABNORMAL HIGH (ref 65–99)
Potassium: 3.8 mmol/L (ref 3.5–5.1)
SODIUM: 133 mmol/L — AB (ref 135–145)

## 2017-05-11 LAB — ECHOCARDIOGRAM COMPLETE
HEIGHTINCHES: 60 in
WEIGHTICAEL: 3580.8 [oz_av]

## 2017-05-11 LAB — HEPARIN LEVEL (UNFRACTIONATED): Heparin Unfractionated: 0.42 IU/mL (ref 0.30–0.70)

## 2017-05-11 LAB — MAGNESIUM: Magnesium: 1.9 mg/dL (ref 1.7–2.4)

## 2017-05-11 MED ORDER — ASPIRIN 81 MG PO CHEW
81.0000 mg | CHEWABLE_TABLET | ORAL | Status: AC
  Administered 2017-05-12: 81 mg via ORAL
  Filled 2017-05-11: qty 1

## 2017-05-11 MED ORDER — SODIUM CHLORIDE 0.9 % IV SOLN
INTRAVENOUS | Status: DC
  Administered 2017-05-12: 06:00:00 via INTRAVENOUS

## 2017-05-11 MED ORDER — SODIUM CHLORIDE 0.9% FLUSH
3.0000 mL | Freq: Two times a day (BID) | INTRAVENOUS | Status: DC
  Administered 2017-05-11 – 2017-05-12 (×2): 3 mL via INTRAVENOUS

## 2017-05-11 MED ORDER — FUROSEMIDE 10 MG/ML IJ SOLN
60.0000 mg | Freq: Once | INTRAMUSCULAR | Status: AC
  Administered 2017-05-11: 60 mg via INTRAVENOUS
  Filled 2017-05-11: qty 6

## 2017-05-11 MED ORDER — SODIUM CHLORIDE 0.9% FLUSH
3.0000 mL | INTRAVENOUS | Status: DC | PRN
Start: 2017-05-11 — End: 2017-05-12

## 2017-05-11 MED ORDER — DILTIAZEM HCL 60 MG PO TABS
60.0000 mg | ORAL_TABLET | Freq: Four times a day (QID) | ORAL | Status: DC
  Administered 2017-05-11 – 2017-05-12 (×4): 60 mg via ORAL
  Filled 2017-05-11 (×3): qty 1

## 2017-05-11 MED ORDER — SODIUM CHLORIDE 0.9 % IV SOLN: 250.0000 mL | INTRAVENOUS | Status: DC | PRN

## 2017-05-11 NOTE — Progress Notes (Signed)
Progress Note  Patient Name: Carrie Haynes Date of Encounter: 05/11/2017  Primary Cardiologist: New   Subjective   Breathing is some better  Less chest pain with a deep breath  Inpatient Medications    Scheduled Meds: . atorvastatin  20 mg Oral QPM  . insulin aspart  0-15 Units Subcutaneous TID WC   Continuous Infusions: . diltiazem (CARDIZEM) infusion 5 mg/hr (05/11/17 0403)  . heparin 1,100 Units/hr (05/10/17 1409)   PRN Meds: acetaminophen, ondansetron (ZOFRAN) IV   Vital Signs    Vitals:   05/11/17 0040 05/11/17 0338 05/11/17 0407 05/11/17 0733  BP: 107/80 (!) 112/96 111/70 103/65  Pulse: (!) 104 (!) 101  65  Resp: (!) 22 (!) 27  20  Temp: 98.8 F (37.1 C) 98.7 F (37.1 C)  98.4 F (36.9 C)  TempSrc: Oral Oral  Oral  SpO2: 99% 97%  97%  Weight:  223 lb 12.8 oz (101.5 kg)    Height:        Intake/Output Summary (Last 24 hours) at 05/11/17 0801 Last data filed at 05/11/17 1610  Gross per 24 hour  Intake           260.46 ml  Output             2475 ml  Net         -2214.54 ml   Filed Weights   05/10/17 1234 05/10/17 2005 05/11/17 0338  Weight: 220 lb (99.8 kg) 227 lb 12.8 oz (103.3 kg) 223 lb 12.8 oz (101.5 kg)    Telemetry    Atrial fib  Average HR 90s   - Personally Reviewed  ECG      Physical Exam   GEN: No acute distress.   Neck: No JVD Cardiac:   Irreg irreg   no murmurs, rubs, or gallops.  Respiratory: Clear to auscultation bilaterally. GI: Soft, nontender, non-distended  MS:Tr   edema; No deformity. Neuro:  Nonfocal  Psych: Normal affect   Labs    Chemistry Recent Labs Lab 05/10/17 1235 05/11/17 0201  NA 134* 133*  K 4.1 3.8  CL 102 100*  CO2 22 25  GLUCOSE 139* 115*  BUN 25* 26*  CREATININE 0.95 1.00  CALCIUM 8.9 8.2*  GFRNONAA 58* 55*  GFRAA >60 >60  ANIONGAP 10 8     Hematology Recent Labs Lab 05/10/17 1235 05/11/17 0201  WBC 7.4 7.0  RBC 3.67* 3.50*  HGB 10.6* 10.3*  HCT 32.5* 31.1*  MCV 88.6 88.9    MCH 28.9 29.4  MCHC 32.6 33.1  RDW 14.1 14.2  PLT 191 168    Cardiac Enzymes Recent Labs Lab 05/10/17 2033 05/11/17 0201  TROPONINI <0.03 <0.03    Recent Labs Lab 05/10/17 1248  TROPIPOC 0.01     BNP Recent Labs Lab 05/10/17 1235  BNP 115.2*     DDimer No results for input(s): DDIMER in the last 168 hours.   Radiology    Ct Angio Chest Pe W And/or Wo Contrast  Result Date: 05/10/2017 CLINICAL DATA:  Chest pain short of breath. History of DVT and pulmonary embolism EXAM: CT ANGIOGRAPHY CHEST WITH CONTRAST TECHNIQUE: Multidetector CT imaging of the chest was performed using the standard protocol during bolus administration of intravenous contrast. Multiplanar CT image reconstructions and MIPs were obtained to evaluate the vascular anatomy. CONTRAST:  100 mL Isovue 370 IV COMPARISON:  CT chest 03/03/2009 FINDINGS: Cardiovascular: Pulmonary arteries are enlarged suggesting pulmonary artery hypertension. Enlargement of the right atrium and  right ventricle. Negative for pulmonary embolism. Atherosclerotic calcification in the aortic arch on left coronary artery. Negative for aortic aneurysm. Mediastinum/Nodes: Negative for mass or adenopathy. Lungs/Pleura: Hypoventilation with mild atelectasis in the lung bases. Negative for infiltrate effusion or lung mass Upper Abdomen: Negative Musculoskeletal: Negative Review of the MIP images confirms the above findings. IMPRESSION: Negative for pulmonary embolism Pulmonary artery hypertension Mild atelectasis in the lung bases. Electronically Signed   By: Marlan Palau M.D.   On: 05/10/2017 14:00   Dg Chest Portable 1 View  Result Date: 05/10/2017 CLINICAL DATA:  Chest pain and shortness of breath for 2 days EXAM: PORTABLE CHEST 1 VIEW COMPARISON:  10/25/2006 FINDINGS: Cardiac shadow is enlarged in size. Aortic calcifications are noted. The lungs are well aerated bilaterally. No focal infiltrate or sizable effusion is seen. No bony abnormality  is noted. IMPRESSION: No active disease. Electronically Signed   By: Alcide Clever M.D.   On: 05/10/2017 13:42    Cardiac Studies   Echo pending    Patient Profile     76 y.o. female Hx remote PE presents with SOB   Assessment & Plan    1  Dyspnea  Improving  Continue diuresis  Echo today    2  Atrial fib  Rates are better  Continue anticoagulation with heparin    Signed, Dietrich Pates, MD  05/11/2017, 8:01 AM

## 2017-05-11 NOTE — Progress Notes (Signed)
  Echocardiogram 2D Echocardiogram has been performed.  Carrie Haynes 05/11/2017, 11:49 AM

## 2017-05-11 NOTE — Progress Notes (Signed)
ANTICOAGULATION CONSULT NOTE - Follow Up Consult  Pharmacy Consult for Heparin Indication: atrial fibrillation  No Known Allergies  Patient Measurements: Height: 5' (152.4 cm) Weight: 223 lb 12.8 oz (101.5 kg) IBW/kg (Calculated) : 45.5 Heparin Dosing Weight:  70.8 kg  Vital Signs: Temp: 98.4 F (36.9 C) (07/26 0733) Temp Source: Oral (07/26 1100) BP: 101/76 (07/26 1212) Pulse Rate: 78 (07/26 1100)  Labs:  Recent Labs  05/10/17 1235 05/10/17 2033 05/10/17 2211 05/11/17 0201 05/11/17 0722  HGB 10.6*  --   --  10.3*  --   HCT 32.5*  --   --  31.1*  --   PLT 191  --   --  168  --   HEPARINUNFRC  --   --  0.45 0.42  --   CREATININE 0.95  --   --  1.00  --   TROPONINI  --  <0.03  --  <0.03 <0.03    Estimated Creatinine Clearance: 53.7 mL/min (by C-G formula based on SCr of 1 mg/dL).   Assessment:  Anticoag: heparin for suspected PE and hx of afib. CT negative for PE. H/H low, plts wnl. No AC PTA. HL 0.42 in goal. CHADS2VASC=4  Goal of Therapy:  Heparin level 0.3-0.7 units/ml Monitor platelets by anticoagulation protocol: Yes   Plan:  Continue IV heparin at 1100 units/hr Daily HL and CBC F/u plans for oral anticoagulation    Kimberlly Norgard S. Merilynn Finland, PharmD, BCPS Clinical Staff Pharmacist Pager [phone]  Misty Stanley Stillinger 05/11/2017,1:39 PM

## 2017-05-12 ENCOUNTER — Encounter (HOSPITAL_COMMUNITY): Payer: Self-pay | Admitting: Interventional Cardiology

## 2017-05-12 ENCOUNTER — Encounter (HOSPITAL_COMMUNITY)
Admission: EM | Disposition: A | Discharge status: Home / Self Care | Payer: Self-pay | Source: Home / Self Care | Attending: Internal Medicine

## 2017-05-12 DIAGNOSIS — R0600 Dyspnea, unspecified: Secondary | ICD-10-CM | POA: Diagnosis not present

## 2017-05-12 DIAGNOSIS — I340 Nonrheumatic mitral (valve) insufficiency: Secondary | ICD-10-CM | POA: Diagnosis not present

## 2017-05-12 DIAGNOSIS — E785 Hyperlipidemia, unspecified: Secondary | ICD-10-CM | POA: Diagnosis present

## 2017-05-12 DIAGNOSIS — I2510 Atherosclerotic heart disease of native coronary artery without angina pectoris: Secondary | ICD-10-CM

## 2017-05-12 DIAGNOSIS — I5043 Acute on chronic combined systolic (congestive) and diastolic (congestive) heart failure: Secondary | ICD-10-CM | POA: Diagnosis not present

## 2017-05-12 DIAGNOSIS — I482 Chronic atrial fibrillation: Secondary | ICD-10-CM | POA: Diagnosis not present

## 2017-05-12 DIAGNOSIS — E119 Type 2 diabetes mellitus without complications: Secondary | ICD-10-CM | POA: Diagnosis not present

## 2017-05-12 DIAGNOSIS — I420 Dilated cardiomyopathy: Secondary | ICD-10-CM | POA: Diagnosis not present

## 2017-05-12 DIAGNOSIS — I951 Orthostatic hypotension: Secondary | ICD-10-CM | POA: Diagnosis not present

## 2017-05-12 DIAGNOSIS — I959 Hypotension, unspecified: Secondary | ICD-10-CM | POA: Diagnosis not present

## 2017-05-12 DIAGNOSIS — I272 Pulmonary hypertension, unspecified: Secondary | ICD-10-CM | POA: Diagnosis present

## 2017-05-12 DIAGNOSIS — Z86718 Personal history of other venous thrombosis and embolism: Secondary | ICD-10-CM | POA: Diagnosis not present

## 2017-05-12 DIAGNOSIS — Z6841 Body Mass Index (BMI) 40.0 and over, adult: Secondary | ICD-10-CM | POA: Diagnosis not present

## 2017-05-12 DIAGNOSIS — R0609 Other forms of dyspnea: Secondary | ICD-10-CM

## 2017-05-12 DIAGNOSIS — Z79899 Other long term (current) drug therapy: Secondary | ICD-10-CM | POA: Diagnosis not present

## 2017-05-12 DIAGNOSIS — I4891 Unspecified atrial fibrillation: Secondary | ICD-10-CM | POA: Diagnosis not present

## 2017-05-12 DIAGNOSIS — I469 Cardiac arrest, cause unspecified: Secondary | ICD-10-CM | POA: Diagnosis not present

## 2017-05-12 DIAGNOSIS — Z86711 Personal history of pulmonary embolism: Secondary | ICD-10-CM | POA: Diagnosis not present

## 2017-05-12 DIAGNOSIS — I481 Persistent atrial fibrillation: Secondary | ICD-10-CM | POA: Diagnosis not present

## 2017-05-12 DIAGNOSIS — Z8249 Family history of ischemic heart disease and other diseases of the circulatory system: Secondary | ICD-10-CM | POA: Diagnosis not present

## 2017-05-12 DIAGNOSIS — I10 Essential (primary) hypertension: Secondary | ICD-10-CM | POA: Diagnosis not present

## 2017-05-12 DIAGNOSIS — Z7982 Long term (current) use of aspirin: Secondary | ICD-10-CM | POA: Diagnosis not present

## 2017-05-12 DIAGNOSIS — I110 Hypertensive heart disease with heart failure: Secondary | ICD-10-CM | POA: Diagnosis present

## 2017-05-12 DIAGNOSIS — R0602 Shortness of breath: Secondary | ICD-10-CM | POA: Diagnosis present

## 2017-05-12 DIAGNOSIS — I808 Phlebitis and thrombophlebitis of other sites: Secondary | ICD-10-CM | POA: Diagnosis not present

## 2017-05-12 DIAGNOSIS — Z7984 Long term (current) use of oral hypoglycemic drugs: Secondary | ICD-10-CM | POA: Diagnosis not present

## 2017-05-12 DIAGNOSIS — R0601 Orthopnea: Secondary | ICD-10-CM | POA: Diagnosis not present

## 2017-05-12 HISTORY — PX: RIGHT/LEFT HEART CATH AND CORONARY ANGIOGRAPHY: CATH118266

## 2017-05-12 LAB — GLUCOSE, CAPILLARY
Glucose-Capillary: 123 mg/dL — ABNORMAL HIGH (ref 65–99)
Glucose-Capillary: 122 mg/dL — ABNORMAL HIGH (ref 65–99)
Glucose-Capillary: 136 mg/dL — ABNORMAL HIGH (ref 65–99)
Glucose-Capillary: 121 mg/dL — ABNORMAL HIGH (ref 65–99)

## 2017-05-12 LAB — CBC
HCT: 30.7 % — ABNORMAL LOW (ref 36.0–46.0)
HEMOGLOBIN: 10.1 g/dL — AB (ref 12.0–15.0)
MCH: 29 pg (ref 26.0–34.0)
MCHC: 32.9 g/dL (ref 30.0–36.0)
MCV: 88.2 fL (ref 78.0–100.0)
Platelets: 189 10*3/uL (ref 150–400)
RBC: 3.48 MIL/uL — AB (ref 3.87–5.11)
RDW: 13.9 % (ref 11.5–15.5)
WBC: 6.7 10*3/uL (ref 4.0–10.5)

## 2017-05-12 LAB — HEMOGLOBIN A1C
HEMOGLOBIN A1C: 6.7 % — AB (ref 4.8–5.6)
Mean Plasma Glucose: 146 mg/dL

## 2017-05-12 LAB — BASIC METABOLIC PANEL
ANION GAP: 9 (ref 5–15)
BUN: 25 mg/dL — ABNORMAL HIGH (ref 6–20)
CALCIUM: 8.4 mg/dL — AB (ref 8.9–10.3)
CO2: 24 mmol/L (ref 22–32)
Chloride: 98 mmol/L — ABNORMAL LOW (ref 101–111)
Creatinine, Ser: 0.88 mg/dL (ref 0.44–1.00)
Glucose, Bld: 121 mg/dL — ABNORMAL HIGH (ref 65–99)
Potassium: 3.5 mmol/L (ref 3.5–5.1)
SODIUM: 131 mmol/L — AB (ref 135–145)

## 2017-05-12 LAB — POCT I-STAT 3, VENOUS BLOOD GAS (G3P V)
ACID-BASE EXCESS: 2 mmol/L (ref 0.0–2.0)
BICARBONATE: 27 mmol/L (ref 20.0–28.0)
O2 SAT: 60 %
PCO2 VEN: 43.5 mmHg — AB (ref 44.0–60.0)
PO2 VEN: 31 mmHg — AB (ref 32.0–45.0)
TCO2: 28 mmol/L (ref 0–100)
pH, Ven: 7.4 (ref 7.250–7.430)

## 2017-05-12 LAB — POCT I-STAT 3, ART BLOOD GAS (G3+)
ACID-BASE DEFICIT: 1 mmol/L (ref 0.0–2.0)
BICARBONATE: 24 mmol/L (ref 20.0–28.0)
O2 SAT: 97 %
TCO2: 25 mmol/L (ref 0–100)
pCO2 arterial: 40.1 mmHg (ref 32.0–48.0)
pH, Arterial: 7.385 (ref 7.350–7.450)
pO2, Arterial: 91 mmHg (ref 83.0–108.0)

## 2017-05-12 LAB — PROTIME-INR
INR: 1.07
PROTHROMBIN TIME: 14 s (ref 11.4–15.2)

## 2017-05-12 LAB — HEPARIN LEVEL (UNFRACTIONATED): Heparin Unfractionated: 0.39 IU/mL (ref 0.30–0.70)

## 2017-05-12 SURGERY — RIGHT/LEFT HEART CATH AND CORONARY ANGIOGRAPHY
Anesthesia: LOCAL

## 2017-05-12 MED ORDER — MIDAZOLAM HCL 2 MG/2ML IJ SOLN
INTRAMUSCULAR | Status: AC
  Filled 2017-05-12: qty 2

## 2017-05-12 MED ORDER — HEPARIN (PORCINE) IN NACL 2-0.9 UNIT/ML-% IJ SOLN
INTRAMUSCULAR | Status: AC | PRN
  Administered 2017-05-12: 1000 mL

## 2017-05-12 MED ORDER — OXYCODONE-ACETAMINOPHEN 5-325 MG PO TABS
1.0000 | ORAL_TABLET | ORAL | Status: DC | PRN
  Administered 2017-05-14: 1 via ORAL
  Filled 2017-05-12: qty 1

## 2017-05-12 MED ORDER — ONDANSETRON HCL 4 MG/2ML IJ SOLN: 4.0000 mg | Freq: Four times a day (QID) | INTRAMUSCULAR | Status: DC | PRN

## 2017-05-12 MED ORDER — FENTANYL CITRATE (PF) 100 MCG/2ML IJ SOLN
INTRAMUSCULAR | Status: DC | PRN
  Administered 2017-05-12: 50 ug via INTRAVENOUS

## 2017-05-12 MED ORDER — FUROSEMIDE 10 MG/ML IJ SOLN
80.0000 mg | Freq: Once | INTRAMUSCULAR | Status: AC
  Administered 2017-05-12: 80 mg via INTRAVENOUS
  Filled 2017-05-12: qty 8

## 2017-05-12 MED ORDER — LIDOCAINE HCL 1 % IJ SOLN
INTRAMUSCULAR | Status: AC
  Filled 2017-05-12: qty 20

## 2017-05-12 MED ORDER — METOPROLOL TARTRATE 25 MG PO TABS: 25.0000 mg | ORAL_TABLET | Freq: Four times a day (QID) | ORAL | Status: DC

## 2017-05-12 MED ORDER — SODIUM CHLORIDE 0.9% FLUSH: 3.0000 mL | Freq: Two times a day (BID) | INTRAVENOUS | Status: DC

## 2017-05-12 MED ORDER — HEPARIN SODIUM (PORCINE) 1000 UNIT/ML IJ SOLN
INTRAMUSCULAR | Status: AC
  Filled 2017-05-12: qty 1

## 2017-05-12 MED ORDER — HEPARIN (PORCINE) IN NACL 100-0.45 UNIT/ML-% IJ SOLN
1100.0000 [IU]/h | INTRAMUSCULAR | Status: AC
  Administered 2017-05-12: 1100 [IU]/h via INTRAVENOUS

## 2017-05-12 MED ORDER — SODIUM CHLORIDE 0.9 % IV SOLN: 250.0000 mL | INTRAVENOUS | Status: DC | PRN

## 2017-05-12 MED ORDER — ACETAMINOPHEN 325 MG PO TABS: 650.0000 mg | ORAL_TABLET | ORAL | Status: DC | PRN

## 2017-05-12 MED ORDER — MIDAZOLAM HCL 2 MG/2ML IJ SOLN
INTRAMUSCULAR | Status: DC | PRN
  Administered 2017-05-12: 1 mg via INTRAVENOUS

## 2017-05-12 MED ORDER — VERAPAMIL HCL 2.5 MG/ML IV SOLN
INTRAVENOUS | Status: AC
  Filled 2017-05-12: qty 2

## 2017-05-12 MED ORDER — LIDOCAINE HCL (PF) 1 % IJ SOLN
INTRAMUSCULAR | Status: DC | PRN
  Administered 2017-05-12 (×2): 2 mL via INTRADERMAL

## 2017-05-12 MED ORDER — VERAPAMIL HCL 2.5 MG/ML IV SOLN
INTRAVENOUS | Status: DC | PRN
  Administered 2017-05-12: 10 mL via INTRA_ARTERIAL

## 2017-05-12 MED ORDER — IOPAMIDOL (ISOVUE-370) INJECTION 76%
INTRAVENOUS | Status: AC
  Filled 2017-05-12: qty 100

## 2017-05-12 MED ORDER — SODIUM CHLORIDE 0.9% FLUSH: 3.0000 mL | INTRAVENOUS | Status: DC | PRN

## 2017-05-12 MED ORDER — METOPROLOL TARTRATE 50 MG PO TABS
50.0000 mg | ORAL_TABLET | Freq: Three times a day (TID) | ORAL | Status: DC
  Administered 2017-05-12: 50 mg via ORAL
  Filled 2017-05-12: qty 1

## 2017-05-12 MED ORDER — HEPARIN (PORCINE) IN NACL 2-0.9 UNIT/ML-% IJ SOLN
INTRAMUSCULAR | Status: AC
  Filled 2017-05-12: qty 1000

## 2017-05-12 MED ORDER — FENTANYL CITRATE (PF) 100 MCG/2ML IJ SOLN
INTRAMUSCULAR | Status: AC
  Filled 2017-05-12: qty 2

## 2017-05-12 MED ORDER — HEPARIN SODIUM (PORCINE) 1000 UNIT/ML IJ SOLN
INTRAMUSCULAR | Status: DC | PRN
  Administered 2017-05-12: 5000 [IU] via INTRAVENOUS

## 2017-05-12 MED ORDER — IOPAMIDOL (ISOVUE-370) INJECTION 76%
INTRAVENOUS | Status: DC | PRN
  Administered 2017-05-12: 70 mL via INTRA_ARTERIAL

## 2017-05-12 SURGICAL SUPPLY — 14 items
CATH BALLN WEDGE 5F 110CM (CATHETERS) ×2 IMPLANT
CATH EXPO 5FR FR4 (CATHETERS) ×2 IMPLANT
COVER PRB 48X5XTLSCP FOLD TPE (BAG) ×1 IMPLANT
COVER PROBE 5X48 (BAG) ×1
DEVICE RAD COMP TR BAND LRG (VASCULAR PRODUCTS) ×2 IMPLANT
GLIDESHEATH SLEND A-KIT 6F 22G (SHEATH) ×2 IMPLANT
GUIDEWIRE INQWIRE 1.5J.035X260 (WIRE) ×1 IMPLANT
HOVERMATT SINGLE USE (MISCELLANEOUS) ×2 IMPLANT
INQWIRE 1.5J .035X260CM (WIRE) ×2
KIT HEART LEFT (KITS) ×2 IMPLANT
PACK CARDIAC CATHETERIZATION (CUSTOM PROCEDURE TRAY) ×2 IMPLANT
SHEATH GLIDE SLENDER 4/5FR (SHEATH) ×2 IMPLANT
TRANSDUCER W/STOPCOCK (MISCELLANEOUS) ×2 IMPLANT
TUBING CIL FLEX 10 FLL-RA (TUBING) ×2 IMPLANT

## 2017-05-12 NOTE — H&P (View-Only) (Signed)
Progress Note  Patient Name: Carrie Haynes Date of Encounter: 05/12/2017   Primary Cardiologist: New  Subjective   No CP  Breathing is better    Inpatient Medications    Scheduled Meds: . atorvastatin  20 mg Oral QPM  . diltiazem  60 mg Oral Q6H  . insulin aspart  0-15 Units Subcutaneous TID WC  . sodium chloride flush  3 mL Intravenous Q12H   Continuous Infusions: . sodium chloride    . sodium chloride 10 mL/hr at 05/12/17 1518  . heparin 1,100 Units/hr (05/11/17 1800)   PRN Meds: sodium chloride, acetaminophen, ondansetron (ZOFRAN) IV, sodium chloride flush   Vital Signs    Vitals:   05/11/17 1939 05/11/17 2327 05/12/17 0345 05/12/17 0620  BP: 115/68 112/66 (!) 99/56 109/61  Pulse: 86 100 98   Resp: 15 (!) 24 (!) 26   Temp: 97.6 F (36.4 C) 97.8 F (36.6 C) 98 F (36.7 C)   TempSrc: Oral Oral Oral   SpO2: 100% 97% 98%   Weight:   222 lb 8 oz (100.9 kg)   Height:        Intake/Output Summary (Last 24 hours) at 05/12/17 0633 Last data filed at 05/12/17 0351  Gross per 24 hour  Intake          1553.75 ml  Output              825 ml  Net           728.75 ml   NEt negative 1.5 L   Filed Weights   05/10/17 2005 05/11/17 0338 05/12/17 0345  Weight: 227 lb 12.8 oz (103.3 kg) 223 lb 12.8 oz (101.5 kg) 222 lb 8 oz (100.9 kg)    Telemetry    Atrial fib  90s to 100s   - Personally Reviewed  ECG      Physical Exam  Morbidly obese in NAD   GEN: No acute distress.   Neck:  JVP increased Cardiac: Irreg Irreg  II/VI Systolic murmur  , no, rubs, or gallops.  Respiratory: Clear to auscultation bilaterally. GI: Soft, nontender, non-distended  MS: Tr  edema; No deformity. Neuro:  Nonfocal  Psych: Normal affect   Labs    Chemistry Recent Labs Lab 05/10/17 1235 05/11/17 0201  NA 134* 133*  K 4.1 3.8  CL 102 100*  CO2 22 25  GLUCOSE 139* 115*  BUN 25* 26*  CREATININE 0.95 1.00  CALCIUM 8.9 8.2*  GFRNONAA 58* 55*  GFRAA >60 >60  ANIONGAP  10 8     Hematology Recent Labs Lab 05/10/17 1235 05/11/17 0201 05/12/17 0311  WBC 7.4 7.0 6.7  RBC 3.67* 3.50* 3.48*  HGB 10.6* 10.3* 10.1*  HCT 32.5* 31.1* 30.7*  MCV 88.6 88.9 88.2  MCH 28.9 29.4 29.0  MCHC 32.6 33.1 32.9  RDW 14.1 14.2 13.9  PLT 191 168 189    Cardiac Enzymes Recent Labs Lab 05/10/17 2033 05/11/17 0201 05/11/17 0722  TROPONINI <0.03 <0.03 <0.03    Recent Labs Lab 05/10/17 1248  TROPIPOC 0.01     BNP Recent Labs Lab 05/10/17 1235  BNP 115.2*     DDimer No results for input(s): DDIMER in the last 168 hours.   Radiology    Ct Angio Chest Pe W And/or Wo Contrast  Result Date: 05/10/2017 CLINICAL DATA:  Chest pain short of breath. History of DVT and pulmonary embolism EXAM: CT ANGIOGRAPHY CHEST WITH CONTRAST TECHNIQUE: Multidetector CT imaging of the chest  was performed using the standard protocol during bolus administration of intravenous contrast. Multiplanar CT image reconstructions and MIPs were obtained to evaluate the vascular anatomy. CONTRAST:  100 mL Isovue 370 IV COMPARISON:  CT chest 03/03/2009 FINDINGS: Cardiovascular: Pulmonary arteries are enlarged suggesting pulmonary artery hypertension. Enlargement of the right atrium and right ventricle. Negative for pulmonary embolism. Atherosclerotic calcification in the aortic arch on left coronary artery. Negative for aortic aneurysm. Mediastinum/Nodes: Negative for mass or adenopathy. Lungs/Pleura: Hypoventilation with mild atelectasis in the lung bases. Negative for infiltrate effusion or lung mass Upper Abdomen: Negative Musculoskeletal: Negative Review of the MIP images confirms the above findings. IMPRESSION: Negative for pulmonary embolism Pulmonary artery hypertension Mild atelectasis in the lung bases. Electronically Signed   By: Marlan Palau M.D.   On: 05/10/2017 14:00   Dg Chest Portable 1 View  Result Date: 05/10/2017 CLINICAL DATA:  Chest pain and shortness of breath for 2 days  EXAM: PORTABLE CHEST 1 VIEW COMPARISON:  10/25/2006 FINDINGS: Cardiac shadow is enlarged in size. Aortic calcifications are noted. The lungs are well aerated bilaterally. No focal infiltrate or sizable effusion is seen. No bony abnormality is noted. IMPRESSION: No active disease. Electronically Signed   By: Alcide Clever M.D.   On: 05/10/2017 13:42    Cardiac Studies   Echo:  7/25  LVEF 45 to 50%  RVEF At least moderate MR  Mod to severe TR   RV enlarged  No comment on RVEF   Patient Profile       Assessment & Plan    1  Dyspnea  IMproved with diuresis (though not a lot) and control of atrial fib Echo yesterday showed mild decrease in LVEF  RV was noted to be large Plan for R and L heart catheterization today    2 Atrial fibrillatoin  Rates are fairly well ocntrolled at rest  COntinue heparin for now.   She is on dilt 60 q 6 hours  WIll switch to b  Blocker after cath.    Signed, Dietrich Pates, MD  05/12/2017, 6:33 AM

## 2017-05-12 NOTE — Progress Notes (Signed)
ANTICOAGULATION CONSULT NOTE - Follow Up Consult  Pharmacy Consult for Heparin Indication: atrial fibrillation  No Known Allergies  Patient Measurements: Height: 5' (152.4 cm) Weight: 222 lb 8 oz (100.9 kg) IBW/kg (Calculated) : 45.5 Heparin Dosing Weight:  70.8 kg  Vital Signs: Temp: 98 F (36.7 C) (07/27 0909) Temp Source: Oral (07/27 0909) BP: 100/55 (07/27 0909) Pulse Rate: 92 (07/27 0909)  Labs:  Recent Labs  05/10/17 1235 05/10/17 2033 05/10/17 2211 05/11/17 0201 05/11/17 0722 05/12/17 0311 05/12/17 0635  HGB 10.6*  --   --  10.3*  --  10.1*  --   HCT 32.5*  --   --  31.1*  --  30.7*  --   PLT 191  --   --  168  --  189  --   LABPROT  --   --   --   --   --  14.0  --   INR  --   --   --   --   --  1.07  --   HEPARINUNFRC  --   --  0.45 0.42  --  0.39  --   CREATININE 0.95  --   --  1.00  --   --  0.88  TROPONINI  --  <0.03  --  <0.03 <0.03  --   --     Estimated Creatinine Clearance: 60.9 mL/min (by C-G formula based on SCr of 0.88 mg/dL).   Assessment:  Anticoag: heparin for suspected PE and hx of afib. CT negative for PE. H/H low, plts wnl. No AC PTA.  CHADS2VASC=4 Cath scheduled for today  Heparin level therapeutic: 0.39 trending down   Goal of Therapy:  Heparin level 0.3-0.7 units/ml Monitor platelets by anticoagulation protocol: Yes   Plan:  Continue heparin gtt at 1100 units/hr Daily heparin level and CBC F/u [facility] plan post cath  Daylene Posey, PharmD Pharmacy Resident Pager #: [phone] 05/12/2017 9:23 AM

## 2017-05-12 NOTE — Care Management Obs Status (Signed)
MEDICARE OBSERVATION STATUS NOTIFICATION   Patient Details  Name: SUTTYN DERIGGI MRN: [id number] Date of Birth: January 21, 1941   Medicare Observation Status Notification Given:  Yes    Gala Lewandowsky, RN 05/12/2017, 9:09 AM

## 2017-05-12 NOTE — Plan of Care (Signed)
Problem: Activity: Goal: Ability to tolerate increased activity will improve Outcome: Progressing Patient was able to go to the [facility] with feeling less SOB than the previous day.

## 2017-05-12 NOTE — Care Management Note (Addendum)
Case Management Note  Patient Details  Name: Carrie Haynes MRN: [id number] Date of Birth: 07-31-1941  Subjective/Objective:  Pt presented for Chest Pain and Dyspnea- Found to be in Atrial Fib. Initiated on IV Heparin gtt and IV Diltiazem gtt. Plan for cardiac cath 05-12-17. PTA pt Independent from home with husband.  Plan for d/c home once stable. Pt has Medicare- and has no issues with purchasing medications.                   Action/Plan: No needs identified at time of visit.   Expected Discharge Date:                  Expected Discharge Plan:  Home/Self Care  In-House Referral:  NA  Discharge planning Services  CM Consult  Post Acute Care Choice:  NA Choice offered to:  NA  DME Arranged:  N/A DME Agency:  NA  HH Arranged:  NA HH Agency:  NA  Status of Service:  Completed, signed off  If discussed at Long Length of Stay Meetings, dates discussed:    Additional Comments: S/W RUBY @ [employer] [facility] # [phone]    XARELTO 20 MG DAILY   COVER- YES  CO-PAY- $ 37.00 Q/L ONE PILL PER DAY  TIER- 3 DRUG  PRIOR APPROVAL- NO   PHARMACY : [facility], [facility]   1540 05-12-17 Tomi Bamberger, RN,BSN [phone] Benefits Check in process for Xarelto. CM will make pt aware once completed in regards to cost. 30 day free card to be provided.  Gala Lewandowsky, RN 05/12/2017, 10:29 AM

## 2017-05-12 NOTE — Progress Notes (Addendum)
Cath showed normal coronary arteries  R sided pressures elevated but not severe pulmonary HTN  PAP 46/26  PCWP 25  LVEDP 18  Will plan for rate control and diuresis  Will swtich to lopressor 50 mg every 8 hours and titrate   Add ACEI as bp tolerates WOuld switch heparin to Xarelto 20 mg daily starting tomorrow Check lippds in AM   Does have some plaquing of aorta    Will follow closely in clinic and reecho in fall

## 2017-05-12 NOTE — Interval H&P Note (Signed)
Cath Lab Visit (complete for each Cath Lab visit)  Clinical Evaluation Leading to the Procedure:   ACS: No.  Non-ACS:    Anginal Classification: No Symptoms  Anti-ischemic medical therapy: No Therapy  Non-Invasive Test Results: No non-invasive testing performed  Prior CABG: No previous CABG      History and Physical Interval Note:  05/12/2017 11:32 AM  Carrie Haynes  has presented today for surgery, with the diagnosis of chf  The various methods of treatment have been discussed with the patient and family. After consideration of risks, benefits and other options for treatment, the patient has consented to  Procedure(s): Right/Left Heart Cath and Coronary Angiography (N/A) as a surgical intervention .  The patient's history has been reviewed, patient examined, no change in status, stable for surgery.  I have reviewed the patient's chart and labs.  Questions were answered to the patient's satisfaction.     Lyn Records III

## 2017-05-12 NOTE — Progress Notes (Signed)
ANTICOAGULATION CONSULT NOTE - Follow Up Consult  Pharmacy Consult for Heparin Indication: atrial fibrillation  No Known Allergies  Patient Measurements: Height: 5' (152.4 cm) Weight: 222 lb 8 oz (100.9 kg) IBW/kg (Calculated) : 45.5 Heparin Dosing Weight:  70.8 kg  Vital Signs: Temp: 98 F (36.7 C) (07/27 0909) Temp Source: Oral (07/27 0909) BP: 107/70 (07/27 1201) Pulse Rate: 107 (07/27 1201)  Labs:  Recent Labs  05/10/17 1235 05/10/17 2033 05/10/17 2211 05/11/17 0201 05/11/17 0722 05/12/17 0311 05/12/17 0635  HGB 10.6*  --   --  10.3*  --  10.1*  --   HCT 32.5*  --   --  31.1*  --  30.7*  --   PLT 191  --   --  168  --  189  --   LABPROT  --   --   --   --   --  14.0  --   INR  --   --   --   --   --  1.07  --   HEPARINUNFRC  --   --  0.45 0.42  --  0.39  --   CREATININE 0.95  --   --  1.00  --   --  0.88  TROPONINI  --  <0.03  --  <0.03 <0.03  --   --     Estimated Creatinine Clearance: 60.9 mL/min (by C-G formula based on SCr of 0.88 mg/dL).   Assessment:  Anticoag: heparin for suspected PE and hx of afib. CT negative for PE. H/H low, plts wnl. No AC PTA.  CHADS2VASC=4 Cath scheduled for today  Heparin level therapeutic: 0.39 trending down   Goal of Therapy:  Heparin level 0.3-0.7 units/ml Monitor platelets by anticoagulation protocol: Yes   Plan:  Continue heparin gtt at 1100 units/hr Daily heparin level and CBC F/u [facility] plan post cath  Daylene Posey, PharmD Pharmacy Resident Pager #: [phone] 05/12/2017 1:15 PM  ADDENDUM Pharmacy consulted to resume heparin post cath 8h after sheath removal (@ 12:06 per cath procedure log)- TR band placed, no bleeding noted  Plan: Resume heparin at therapeutic rate of 1100 units/hr 8 hours post sheath removal Obtain 8 hour heparin level Daily heparin level and CBC Monitor s/s bleeding  Daylene Posey, PharmD Pharmacy Resident Pager #: [phone] 05/12/2017 1:22 PM

## 2017-05-12 NOTE — Progress Notes (Signed)
Progress Note  Patient Name: Carrie Haynes Date of Encounter: 05/12/2017   Primary Cardiologist: New  Subjective   No CP  Breathing is better    Inpatient Medications    Scheduled Meds: . atorvastatin  20 mg Oral QPM  . diltiazem  60 mg Oral Q6H  . insulin aspart  0-15 Units Subcutaneous TID WC  . sodium chloride flush  3 mL Intravenous Q12H   Continuous Infusions: . sodium chloride    . sodium chloride 10 mL/hr at 05/12/17 1518  . heparin 1,100 Units/hr (05/11/17 1800)   PRN Meds: sodium chloride, acetaminophen, ondansetron (ZOFRAN) IV, sodium chloride flush   Vital Signs    Vitals:   05/11/17 1939 05/11/17 2327 05/12/17 0345 05/12/17 0620  BP: 115/68 112/66 (!) 99/56 109/61  Pulse: 86 100 98   Resp: 15 (!) 24 (!) 26   Temp: 97.6 F (36.4 C) 97.8 F (36.6 C) 98 F (36.7 C)   TempSrc: Oral Oral Oral   SpO2: 100% 97% 98%   Weight:   222 lb 8 oz (100.9 kg)   Height:        Intake/Output Summary (Last 24 hours) at 05/12/17 0633 Last data filed at 05/12/17 0351  Gross per 24 hour  Intake          1553.75 ml  Output              825 ml  Net           728.75 ml   NEt negative 1.5 L   Filed Weights   05/10/17 2005 05/11/17 0338 05/12/17 0345  Weight: 227 lb 12.8 oz (103.3 kg) 223 lb 12.8 oz (101.5 kg) 222 lb 8 oz (100.9 kg)    Telemetry    Atrial fib  90s to 100s   - Personally Reviewed  ECG      Physical Exam  Morbidly obese in NAD   GEN: No acute distress.   Neck:  JVP increased Cardiac: Irreg Irreg  II/VI Systolic murmur  , no, rubs, or gallops.  Respiratory: Clear to auscultation bilaterally. GI: Soft, nontender, non-distended  MS: Tr  edema; No deformity. Neuro:  Nonfocal  Psych: Normal affect   Labs    Chemistry Recent Labs Lab 05/10/17 1235 05/11/17 0201  NA 134* 133*  K 4.1 3.8  CL 102 100*  CO2 22 25  GLUCOSE 139* 115*  BUN 25* 26*  CREATININE 0.95 1.00  CALCIUM 8.9 8.2*  GFRNONAA 58* 55*  GFRAA >60 >60  ANIONGAP  10 8     Hematology Recent Labs Lab 05/10/17 1235 05/11/17 0201 05/12/17 0311  WBC 7.4 7.0 6.7  RBC 3.67* 3.50* 3.48*  HGB 10.6* 10.3* 10.1*  HCT 32.5* 31.1* 30.7*  MCV 88.6 88.9 88.2  MCH 28.9 29.4 29.0  MCHC 32.6 33.1 32.9  RDW 14.1 14.2 13.9  PLT 191 168 189    Cardiac Enzymes Recent Labs Lab 05/10/17 2033 05/11/17 0201 05/11/17 0722  TROPONINI <0.03 <0.03 <0.03    Recent Labs Lab 05/10/17 1248  TROPIPOC 0.01     BNP Recent Labs Lab 05/10/17 1235  BNP 115.2*     DDimer No results for input(s): DDIMER in the last 168 hours.   Radiology    Ct Angio Chest Pe W And/or Wo Contrast  Result Date: 05/10/2017 CLINICAL DATA:  Chest pain short of breath. History of DVT and pulmonary embolism EXAM: CT ANGIOGRAPHY CHEST WITH CONTRAST TECHNIQUE: Multidetector CT imaging of the chest  was performed using the standard protocol during bolus administration of intravenous contrast. Multiplanar CT image reconstructions and MIPs were obtained to evaluate the vascular anatomy. CONTRAST:  100 mL Isovue 370 IV COMPARISON:  CT chest 03/03/2009 FINDINGS: Cardiovascular: Pulmonary arteries are enlarged suggesting pulmonary artery hypertension. Enlargement of the right atrium and right ventricle. Negative for pulmonary embolism. Atherosclerotic calcification in the aortic arch on left coronary artery. Negative for aortic aneurysm. Mediastinum/Nodes: Negative for mass or adenopathy. Lungs/Pleura: Hypoventilation with mild atelectasis in the lung bases. Negative for infiltrate effusion or lung mass Upper Abdomen: Negative Musculoskeletal: Negative Review of the MIP images confirms the above findings. IMPRESSION: Negative for pulmonary embolism Pulmonary artery hypertension Mild atelectasis in the lung bases. Electronically Signed   By: Marlan Palau M.D.   On: 05/10/2017 14:00   Dg Chest Portable 1 View  Result Date: 05/10/2017 CLINICAL DATA:  Chest pain and shortness of breath for 2 days  EXAM: PORTABLE CHEST 1 VIEW COMPARISON:  10/25/2006 FINDINGS: Cardiac shadow is enlarged in size. Aortic calcifications are noted. The lungs are well aerated bilaterally. No focal infiltrate or sizable effusion is seen. No bony abnormality is noted. IMPRESSION: No active disease. Electronically Signed   By: Alcide Clever M.D.   On: 05/10/2017 13:42    Cardiac Studies   Echo:  7/25  LVEF 45 to 50%  RVEF At least moderate MR  Mod to severe TR   RV enlarged  No comment on RVEF   Patient Profile       Assessment & Plan    1  Dyspnea  IMproved with diuresis (though not a lot) and control of atrial fib Echo yesterday showed mild decrease in LVEF  RV was noted to be large Plan for R and L heart catheterization today    2 Atrial fibrillatoin  Rates are fairly well ocntrolled at rest  COntinue heparin for now.   She is on dilt 60 q 6 hours  WIll switch to b  Blocker after cath.    Signed, Dietrich Pates, MD  05/12/2017, 6:33 AM

## 2017-05-13 DIAGNOSIS — I5043 Acute on chronic combined systolic (congestive) and diastolic (congestive) heart failure: Secondary | ICD-10-CM

## 2017-05-13 LAB — CBC
HEMATOCRIT: 31.2 % — AB (ref 36.0–46.0)
Hemoglobin: 10.4 g/dL — ABNORMAL LOW (ref 12.0–15.0)
MCH: 29.2 pg (ref 26.0–34.0)
MCHC: 33.3 g/dL (ref 30.0–36.0)
MCV: 87.6 fL (ref 78.0–100.0)
PLATELETS: 197 10*3/uL (ref 150–400)
RBC: 3.56 MIL/uL — AB (ref 3.87–5.11)
RDW: 13.5 % (ref 11.5–15.5)
WBC: 5.9 10*3/uL (ref 4.0–10.5)

## 2017-05-13 LAB — LIPID PANEL
CHOL/HDL RATIO: 2.6 ratio
Cholesterol: 118 mg/dL (ref 0–200)
HDL: 46 mg/dL (ref >40)
LDL CALC: 62 mg/dL (ref 0–99)
Triglycerides: 48 mg/dL (ref <150)
VLDL: 10 mg/dL (ref 0–40)

## 2017-05-13 LAB — GLUCOSE, CAPILLARY
GLUCOSE-CAPILLARY: 108 mg/dL — AB (ref 65–99)
GLUCOSE-CAPILLARY: 162 mg/dL — AB (ref 65–99)
Glucose-Capillary: 120 mg/dL — ABNORMAL HIGH (ref 65–99)
Glucose-Capillary: 133 mg/dL — ABNORMAL HIGH (ref 65–99)

## 2017-05-13 LAB — HEPARIN LEVEL (UNFRACTIONATED): Heparin Unfractionated: 0.32 IU/mL (ref 0.30–0.70)

## 2017-05-13 MED ORDER — DIGOXIN 250 MCG PO TABS: 0.2500 mg | ORAL_TABLET | Freq: Every day | ORAL | Status: DC

## 2017-05-13 MED ORDER — DIGOXIN 250 MCG PO TABS
0.2500 mg | ORAL_TABLET | Freq: Every day | ORAL | Status: DC
Start: 2017-05-14 — End: 2017-05-14
  Administered 2017-05-14: 0.25 mg via ORAL
  Filled 2017-05-13: qty 1

## 2017-05-13 MED ORDER — RIVAROXABAN 20 MG PO TABS
20.0000 mg | ORAL_TABLET | Freq: Every day | ORAL | Status: DC
  Administered 2017-05-13 – 2017-05-14 (×2): 20 mg via ORAL
  Filled 2017-05-13 (×2): qty 1

## 2017-05-13 MED ORDER — DIGOXIN 250 MCG PO TABS
0.5000 mg | ORAL_TABLET | Freq: Once | ORAL | Status: AC
  Administered 2017-05-13: 0.5 mg via ORAL
  Filled 2017-05-13: qty 2

## 2017-05-13 MED ORDER — METOPROLOL TARTRATE 100 MG PO TABS
100.0000 mg | ORAL_TABLET | Freq: Two times a day (BID) | ORAL | Status: DC
Start: 2017-05-13 — End: 2017-05-14
  Administered 2017-05-13 – 2017-05-14 (×3): 100 mg via ORAL
  Filled 2017-05-13 (×3): qty 1

## 2017-05-13 MED ORDER — RIVAROXABAN (XARELTO) EDUCATION KIT FOR AFIB PATIENTS
PACK | Freq: Once | Status: AC
  Administered 2017-05-13: 09:00:00
  Filled 2017-05-13: qty 1

## 2017-05-13 NOTE — Progress Notes (Signed)
Progress Note  Patient Name: Carrie Haynes Date of Encounter: 05/13/2017   Primary Cardiologist: New  Subjective   NO CP  No SOB   Inpatient Medications    Scheduled Meds: . atorvastatin  20 mg Oral QPM  . insulin aspart  0-15 Units Subcutaneous TID WC  . metoprolol tartrate  50 mg Oral TID   Continuous Infusions: . heparin 1,100 Units/hr (05/12/17 2012)   PRN Meds: acetaminophen, ondansetron (ZOFRAN) IV, oxyCODONE-acetaminophen   Vital Signs    Vitals:   05/12/17 1942 05/12/17 2135 05/13/17 0030 05/13/17 0400  BP: 102/65 112/74 103/61 110/62  Pulse: (!) 115 (!) 119 (!) 106 91  Resp: (!) 27 (!) 30 (!) 22 (!) 24  Temp: 97.6 F (36.4 C) 99.2 F (37.3 C) 99.8 F (37.7 C) 98.7 F (37.1 C)  TempSrc: Oral Oral Oral Oral  SpO2: 98% 95% 96% 94%  Weight:      Height:        Intake/Output Summary (Last 24 hours) at 05/13/17 0657 Last data filed at 05/13/17 4174  Gross per 24 hour  Intake           636.64 ml  Output             2550 ml  Net         -1913.36 ml   NEt negative 3.4 L   Filed Weights   05/10/17 2005 05/11/17 0338 05/12/17 0345  Weight: 227 lb 12.8 oz (103.3 kg) 223 lb 12.8 oz (101.5 kg) 222 lb 8 oz (100.9 kg)    Telemetry    Atrial fib 100s now  - Personally Reviewed  ECG      Physical Exam  Morbidly obese in NAD   GEN: No acute distress.   Neck:  JVP increased Cardiac: Irreg Irreg  II/VI Systolic murmur  , no, rubs, or gallops.  Respiratory: Clear to auscultation bilaterally. GI: Soft, nontender, non-distended  MS: Tr  edema; No deformity. Neuro:  Nonfocal  Psych: Normal affect   Labs    Chemistry  Recent Labs Lab 05/10/17 1235 05/11/17 0201 05/12/17 0635  NA 134* 133* 131*  K 4.1 3.8 3.5  CL 102 100* 98*  CO2 22 25 24   GLUCOSE 139* 115* 121*  BUN 25* 26* 25*  CREATININE 0.95 1.00 0.88  CALCIUM 8.9 8.2* 8.4*  GFRNONAA 58* 55* >60  GFRAA >60 >60 >60  ANIONGAP 10 8 9      Hematology  Recent Labs Lab  05/11/17 0201 05/12/17 0311 05/13/17 0331  WBC 7.0 6.7 5.9  RBC 3.50* 3.48* 3.56*  HGB 10.3* 10.1* 10.4*  HCT 31.1* 30.7* 31.2*  MCV 88.9 88.2 87.6  MCH 29.4 29.0 29.2  MCHC 33.1 32.9 33.3  RDW 14.2 13.9 13.5  PLT 168 189 197    Cardiac Enzymes  Recent Labs Lab 05/10/17 2033 05/11/17 0201 05/11/17 0722  TROPONINI <0.03 <0.03 <0.03     Recent Labs Lab 05/10/17 1248  TROPIPOC 0.01     BNP  Recent Labs Lab 05/10/17 1235  BNP 115.2*     DDimer No results for input(s): DDIMER in the last 168 hours.   Radiology    No results found.  Cardiac Studies   Echo:  7/25  LVEF 45 to 50%  RVEF At least moderate MR  Mod to severe TR   RV enlarged  No comment on RVEF   Patient Profile       Assessment & Plan     1  Acute on chronic systolic CHF   Right dominant coronary anatomy.  Normal coronary arteries.  Estimated left ventricular ejection fraction 35-40%, global hypokinesis, with elevated filling pressures consistent with acute on chronic combined systolic and diastolic heart failure.  PAP 46/26  PCWP 25  LVEDP 18     ? If tachy induced   Given lasix last night   With some response   Plan for medical Rx  Rate control  F?U echo later this fall       Will need daily lasix I think  Start with 40 with 10 KCL    2  Atrial fib  Rates are up this am  Will increase metoprolol to 100 bid   Follow   Transition to PO NOAC    3  Valvular heart dz  Will need to follow with serial echos  May improve as function improves    Possible d/c later today   Signed, Dietrich Pates, MD  05/13/2017, 6:57 AM

## 2017-05-13 NOTE — Progress Notes (Signed)
ANTICOAGULATION CONSULT NOTE - Follow Up Consult  Pharmacy Consult for heparin Indication: atrial fibrillation  Labs:  Recent Labs  05/10/17 1235 05/10/17 2033  05/11/17 0201 05/11/17 0722 05/12/17 0311 05/12/17 0635 05/13/17 0331  HGB 10.6*  --   --  10.3*  --  10.1*  --  10.4*  HCT 32.5*  --   --  31.1*  --  30.7*  --  31.2*  PLT 191  --   --  168  --  189  --  197  LABPROT  --   --   --   --   --  14.0  --   --   INR  --   --   --   --   --  1.07  --   --   HEPARINUNFRC  --   --   < > 0.42  --  0.39  --  0.32  CREATININE 0.95  --   --  1.00  --   --  0.88  --   TROPONINI  --  <0.03  --  <0.03 <0.03  --   --   --   < > = values in this interval not displayed.   Assessment/Plan:  76yo female therapeutic on heparin after resumed. Will continue gtt at current rate and monitor daily level.   Vernard Gambles, PharmD, BCPS  05/13/2017,4:26 AM

## 2017-05-13 NOTE — Progress Notes (Signed)
ANTICOAGULATION CONSULT NOTE - Follow Up Consult  Pharmacy Consult for Heparin > rivaroxaban  Indication: atrial fibrillation  No Known Allergies  Patient Measurements: Height: 5' (152.4 cm) Weight: 222 lb 8 oz (100.9 kg) IBW/kg (Calculated) : 45.5 Heparin Dosing Weight:  70.8 kg  Vital Signs: Temp: 98 F (36.7 C) (07/28 0900) Temp Source: Oral (07/28 0900) BP: 100/64 (07/28 1156) Pulse Rate: 109 (07/28 1156)  Labs:  Recent Labs  05/10/17 2033  05/11/17 0201 05/11/17 0722 05/12/17 0311 05/12/17 0635 05/13/17 0331  HGB  --   < > 10.3*  --  10.1*  --  10.4*  HCT  --   --  31.1*  --  30.7*  --  31.2*  PLT  --   --  168  --  189  --  197  LABPROT  --   --   --   --  14.0  --   --   INR  --   --   --   --  1.07  --   --   HEPARINUNFRC  --   < > 0.42  --  0.39  --  0.32  CREATININE  --   --  1.00  --   --  0.88  --   TROPONINI <0.03  --  <0.03 <0.03  --   --   --   < > = values in this interval not displayed.  Estimated Creatinine Clearance: 60.9 mL/min (by C-G formula based on SCr of 0.88 mg/dL).   Assessment:  Anticoag: heparin for suspected PE and hx of afib. CT negative for PE. Afib RVR 100s on metoprolol   H/H low, plts wnl. No AC PTA.  CHADS2VASC=4  Heparin drip 1100 uts/hr Heparin level therapeutic: 0.32 - convert to Oral AC  Goal of Therapy:  Heparin level 0.3-0.7 units/ml Monitor platelets by anticoagulation protocol: Yes   Plan:   Stop heparin at 5pm Give rivaroxaban 20mg  daily - with a meal - dose ok for renal function  Leota Sauers Pharm.D. CPP, BCPS Clinical Pharmacist [phone] 05/13/2017 3:08 PM

## 2017-05-13 NOTE — Plan of Care (Signed)
Problem: Pain Managment: Goal: General experience of comfort will improve Outcome: Progressing Patient has not had any complaints of pain this far into the shift.   Problem: Skin Integrity: Goal: Risk for impaired skin integrity will decrease Outcome: Progressing Patient has puncture site from cardiac cath that has guaze and tegaderm on it that is clean, dry, and intact. There are no signs of bleeding or hematomas.

## 2017-05-14 DIAGNOSIS — I469 Cardiac arrest, cause unspecified: Secondary | ICD-10-CM

## 2017-05-14 DIAGNOSIS — I959 Hypotension, unspecified: Secondary | ICD-10-CM

## 2017-05-14 DIAGNOSIS — I482 Chronic atrial fibrillation: Secondary | ICD-10-CM

## 2017-05-14 LAB — CBC
HCT: 31.2 % — ABNORMAL LOW (ref 36.0–46.0)
HEMOGLOBIN: 10.4 g/dL — AB (ref 12.0–15.0)
MCH: 29.4 pg (ref 26.0–34.0)
MCHC: 33.3 g/dL (ref 30.0–36.0)
MCV: 88.1 fL (ref 78.0–100.0)
PLATELETS: 208 10*3/uL (ref 150–400)
RBC: 3.54 MIL/uL — AB (ref 3.87–5.11)
RDW: 13.7 % (ref 11.5–15.5)
WBC: 5 10*3/uL (ref 4.0–10.5)

## 2017-05-14 LAB — GLUCOSE, CAPILLARY
Glucose-Capillary: 107 mg/dL — ABNORMAL HIGH (ref 65–99)
Glucose-Capillary: 152 mg/dL — ABNORMAL HIGH (ref 65–99)
Glucose-Capillary: 187 mg/dL — ABNORMAL HIGH (ref 65–99)
Glucose-Capillary: 119 mg/dL — ABNORMAL HIGH (ref 65–99)

## 2017-05-14 MED ORDER — DILTIAZEM HCL 30 MG PO TABS: 30.0000 mg | ORAL_TABLET | Freq: Four times a day (QID) | ORAL | Status: DC

## 2017-05-14 MED ORDER — NOREPINEPHRINE BITARTRATE 1 MG/ML IV SOLN
0.0000 ug/min | INTRAVENOUS | Status: DC
  Filled 2017-05-14: qty 4

## 2017-05-14 MED ORDER — NOREPINEPHRINE BITARTRATE 1 MG/ML IV SOLN
0.0000 ug/min | INTRAVENOUS | Status: DC
  Administered 2017-05-14: 22 ug/min via INTRAVENOUS
  Filled 2017-05-14: qty 16

## 2017-05-14 MED ORDER — EPINEPHRINE PF 1 MG/10ML IJ SOSY
PREFILLED_SYRINGE | INTRAMUSCULAR | Status: AC
  Filled 2017-05-14: qty 10

## 2017-05-14 MED ORDER — DILTIAZEM HCL 30 MG PO TABS: 30.0000 mg | ORAL_TABLET | Freq: Two times a day (BID) | ORAL | Status: DC

## 2017-05-14 MED ORDER — DILTIAZEM HCL 60 MG PO TABS
60.0000 mg | ORAL_TABLET | Freq: Three times a day (TID) | ORAL | Status: DC
  Administered 2017-05-14: 60 mg via ORAL
  Filled 2017-05-14: qty 1

## 2017-05-14 MED ORDER — METOPROLOL TARTRATE 50 MG PO TABS: 75.0000 mg | ORAL_TABLET | Freq: Two times a day (BID) | ORAL | Status: DC

## 2017-05-14 MED ORDER — SODIUM CHLORIDE 0.9 % IV SOLN
INTRAVENOUS | Status: DC
  Administered 2017-05-18 – 2017-05-23 (×3): via INTRAVENOUS

## 2017-05-14 MED FILL — Medication: Qty: 1 | Status: AC

## 2017-05-14 NOTE — Progress Notes (Signed)
Attempted report to 2 H.  Colleen Can, RN

## 2017-05-14 NOTE — Progress Notes (Signed)
Pt HR is low mainly in the 50's some times in the 40's. She feels lightheaded and weak. BP is 70/43. Dr. Tenny Craw was paged. IV NS bolus 250 cc ordered. Will monitor closely.  Colleen Can, RN.

## 2017-05-14 NOTE — Progress Notes (Signed)
Progress Note  Patient Name: Carrie Haynes Date of Encounter: 05/14/2017  Primary Cardiologist:   New    Subjective   Breathing is OK  No CP    Inpatient Medications    Scheduled Meds: . atorvastatin  20 mg Oral QPM  . digoxin  0.25 mg Oral Daily  . insulin aspart  0-15 Units Subcutaneous TID WC  . metoprolol tartrate  100 mg Oral BID  . rivaroxaban  20 mg Oral Q supper   Continuous Infusions:  PRN Meds: acetaminophen, ondansetron (ZOFRAN) IV, oxyCODONE-acetaminophen   Vital Signs    Vitals:   05/13/17 2016 05/13/17 2126 05/14/17 0008 05/14/17 0611  BP: (!) 93/54 101/65 98/69 (!) 116/55  Pulse: (!) 110 99 88 85  Resp: (!) 29  (!) 23 (!) 27  Temp: 99.8 F (37.7 C)  98.6 F (37 C) 98.1 F (36.7 C)  TempSrc:      SpO2: 95%  93% 94%  Weight:    221 lb 3.2 oz (100.3 kg)  Height:        Intake/Output Summary (Last 24 hours) at 05/14/17 0726 Last data filed at 05/14/17 0500  Gross per 24 hour  Intake              120 ml  Output              400 ml  Net             -280 ml   Net neg  3.7 L   Filed Weights   05/11/17 0338 05/12/17 0345 05/14/17 0611  Weight: 223 lb 12.8 oz (101.5 kg) 222 lb 8 oz (100.9 kg) 221 lb 3.2 oz (100.3 kg)    Telemetry    Atrial fib   80s to 100s   - Personally Reviewed  ECG     Physical Exam  Morbidly obese 76 yo  GEN: No acute distress.   Neck:  Neck full   Cardiac: Irreg irreg  , no murmurs, rubs, or gallops.  Respiratory: Clear to auscultation bilaterally. GI: Soft, nontender, non-distended  MS: No edema; No deformity. Neuro:  Nonfocal  Psych: Normal affect   Labs    Chemistry Recent Labs Lab 05/10/17 1235 05/11/17 0201 05/12/17 0635  NA 134* 133* 131*  K 4.1 3.8 3.5  CL 102 100* 98*  CO2 22 25 24   GLUCOSE 139* 115* 121*  BUN 25* 26* 25*  CREATININE 0.95 1.00 0.88  CALCIUM 8.9 8.2* 8.4*  GFRNONAA 58* 55* >60  GFRAA >60 >60 >60  ANIONGAP 10 8 9      Hematology Recent Labs Lab 05/12/17 0311  05/13/17 0331 05/14/17 0256  WBC 6.7 5.9 5.0  RBC 3.48* 3.56* 3.54*  HGB 10.1* 10.4* 10.4*  HCT 30.7* 31.2* 31.2*  MCV 88.2 87.6 88.1  MCH 29.0 29.2 29.4  MCHC 32.9 33.3 33.3  RDW 13.9 13.5 13.7  PLT 189 197 208    Cardiac Enzymes Recent Labs Lab 05/10/17 2033 05/11/17 0201 05/11/17 0722  TROPONINI <0.03 <0.03 <0.03    Recent Labs Lab 05/10/17 1248  TROPIPOC 0.01     BNP Recent Labs Lab 05/10/17 1235  BNP 115.2*     DDimer No results for input(s): DDIMER in the last 168 hours.   Radiology    No results found.  Cardiac Studies   Cath 7/27  Normal coronary arteries  RV 34/13  PA 46/25  PCWP 25  LVEDP 18   Echo 7/26  LVEF 45 to  50%  RV mildly dliated  Mod MR at lest  Mod to severe TR    Patient Profile    Assessment & Plan    1 Acute on chronid systolic CHF   Cath with normal coronary arteries   ? Due to tachy induced CM with atrial fib Recomm reassessment in several months when rate / rhythm controlled  2  Atrial fib  Rates have been difficult   She is on metoprolol, dig  Will add very low dose dilt for now.   ON Xarelto   3  Valvular l dz  TR, MR  WIll need to be followed over time if LVEFimproves     Ambulate in hallway  If does ok with no signif HR increases OK to d/c with close f/u  If heart rate severely elevated may need to arrnage for TEE /Cardioversion   Signed, Dietrich Pates, MD  05/14/2017, 7:26 AM

## 2017-05-14 NOTE — Progress Notes (Signed)
CTSP for worsening bradycardia and hypotension. Patient seen with Dr. Tenny Craw at bedside.   Patient with EF 45-50% and AF.   Unable to rate control patient with b-blocker. So switched to diltiazem.  Patient became very symptomatic with slow AF in 40-50s. And SBP 50-60.  Placed in Trendelenburg. IVF started but remained hypotensive. Confirmed with Doppler.   Started on norepi. Then patient briefly went unconscious with asystole for < 5 seconds. CPR was about to be started but patient recovered. Given 1/2 amp epi at bedside. NE turned to 30.   Patient continues to improve with SBP > 100. NE turned down to 5 but had recurrent hypotension. NE now at 10.   Will transfer to [facility] ICU. Stop diltiazem. Start po amio. Check labs including CBC.   CRITICAL CARE Performed by: Arvilla Meres  Total critical care time: 35 minutes  Critical care time was exclusive of separately billable procedures and treating other patients.  Critical care was necessary to treat or prevent imminent or life-threatening deterioration.  Critical care was time spent personally by me (independent of midlevel providers or residents) on the following activities: development of treatment plan with patient and/or surrogate as well as nursing, discussions with consultants, evaluation of patient's response to treatment, examination of patient, obtaining history from patient or surrogate, ordering and performing treatments and interventions, ordering and review of laboratory studies, ordering and review of radiographic studies, pulse oximetry and re-evaluation of patient's condition.  Arvilla Meres, MD  1:41 PM

## 2017-05-14 NOTE — Progress Notes (Signed)
   Met w/ family (husband) and patient at bedside.  Introduced Art gallery manager.  Will follow, as needed.

## 2017-05-14 NOTE — Progress Notes (Signed)
eLink Physician-Brief Progress Note Patient Name: Carrie Haynes DOB: 1940/11/26 MRN: [id number]   Date of Service  05/14/2017  HPI/Events of Note  Afib with RVR,  Now HR 60  Pt stable at present  eICU Interventions  eLINK will continue to monitor      Intervention Category Evaluation Type: New Patient Evaluation  Shan Levans 05/14/2017, 3:53 PM

## 2017-05-14 NOTE — Progress Notes (Signed)
Pt admitted to 2H07. Dr Tenny Craw at bedside. On arrival BPs in 70s, Levophed titrated to 22. Pt in NAD, denies SOB or CP. HR AF in 60s. Placed on Zoll. Pt color noted to be ashen, per transferring RN, this is baseline.

## 2017-05-14 NOTE — Progress Notes (Signed)
Pt ambulated in the hallway, HR increased up to the 140's, she also complained of mild SOB. O2 sat remained above 90 %.   Colleen Can, RN

## 2017-05-15 DIAGNOSIS — I951 Orthostatic hypotension: Secondary | ICD-10-CM

## 2017-05-15 DIAGNOSIS — I4891 Unspecified atrial fibrillation: Secondary | ICD-10-CM

## 2017-05-15 LAB — GLUCOSE, CAPILLARY
GLUCOSE-CAPILLARY: 129 mg/dL — AB (ref 65–99)
GLUCOSE-CAPILLARY: 78 mg/dL (ref 65–99)
Glucose-Capillary: 93 mg/dL (ref 65–99)
Glucose-Capillary: 197 mg/dL — ABNORMAL HIGH (ref 65–99)

## 2017-05-15 LAB — BASIC METABOLIC PANEL
ANION GAP: 10 (ref 5–15)
BUN: 36 mg/dL — ABNORMAL HIGH (ref 6–20)
CALCIUM: 8.4 mg/dL — AB (ref 8.9–10.3)
CHLORIDE: 98 mmol/L — AB (ref 101–111)
CO2: 24 mmol/L (ref 22–32)
Creatinine, Ser: 1.82 mg/dL — ABNORMAL HIGH (ref 0.44–1.00)
GFR calc non Af Amer: 26 mL/min — ABNORMAL LOW (ref >60)
GFR, EST AFRICAN AMERICAN: 31 mL/min — AB (ref >60)
GLUCOSE: 113 mg/dL — AB (ref 65–99)
POTASSIUM: 4.8 mmol/L (ref 3.5–5.1)
Sodium: 132 mmol/L — ABNORMAL LOW (ref 135–145)

## 2017-05-15 LAB — CBC
HEMATOCRIT: 32.6 % — AB (ref 36.0–46.0)
HEMOGLOBIN: 10.7 g/dL — AB (ref 12.0–15.0)
MCH: 29.1 pg (ref 26.0–34.0)
MCHC: 32.8 g/dL (ref 30.0–36.0)
MCV: 88.6 fL (ref 78.0–100.0)
Platelets: 203 10*3/uL (ref 150–400)
RBC: 3.68 MIL/uL — AB (ref 3.87–5.11)
RDW: 13.7 % (ref 11.5–15.5)
WBC: 6 10*3/uL (ref 4.0–10.5)

## 2017-05-15 LAB — HEPARIN LEVEL (UNFRACTIONATED)

## 2017-05-15 LAB — APTT: APTT: 56 s — AB (ref 24–36)

## 2017-05-15 MED ORDER — RIVAROXABAN 15 MG PO TABS: 15.0000 mg | ORAL_TABLET | Freq: Every day | ORAL | Status: DC

## 2017-05-15 MED ORDER — HEPARIN (PORCINE) IN NACL 100-0.45 UNIT/ML-% IJ SOLN
950.0000 [IU]/h | INTRAMUSCULAR | Status: AC
  Administered 2017-05-15: 1100 [IU]/h via INTRAVENOUS
  Administered 2017-05-16: 800 [IU]/h via INTRAVENOUS
  Administered 2017-05-18: 850 [IU]/h via INTRAVENOUS
  Filled 2017-05-15 (×3): qty 250

## 2017-05-15 NOTE — Plan of Care (Signed)
Problem: Education: Goal: Knowledge of disease or condition will improve Outcome: Progressing Pt states basic understanding of Afib with RVR and treatment plan of rate control with meds. Also states understand of incident of asystole prossibly caused by the addition of diltiazem to medications.  Goal: Understanding of medication regimen will improve Outcome: Progressing Pt states understand of incident of asystole prossibly caused by the addition of diltiazem to medications.

## 2017-05-15 NOTE — Progress Notes (Signed)
Patient arrived to unit via wheelchair with RN Gabe. Family at bedside. All vital signs stable and patient free from pain.

## 2017-05-15 NOTE — Progress Notes (Signed)
ANTICOAGULATION CONSULT NOTE - Follow Up Consult  Pharmacy Consult:  Xarelto >> Heparin Indication: atrial fibrillation  No Known Allergies  Patient Measurements: Height: 5' (152.4 cm) Weight: 221 lb 3.2 oz (100.3 kg) IBW/kg (Calculated) : 45.5 Heparin Dosing Weight:  71 kg  Vital Signs: Temp: 98.1 F (36.7 C) (07/30 0824) Temp Source: Oral (07/30 0824) BP: 108/59 (07/30 0800) Pulse Rate: 91 (07/30 0800)  Labs:  Recent Labs  05/13/17 0331 05/14/17 0256 05/15/17 0237  HGB 10.4* 10.4* 10.7*  HCT 31.2* 31.2* 32.6*  PLT 197 208 203  HEPARINUNFRC 0.32  --   --   CREATININE  --   --  1.82*    Estimated Creatinine Clearance: 29.3 mL/min (A) (by C-G formula based on SCr of 1.82 mg/dL (H)).   Assessment: 89 YOF with history of Afib and DVT/PE started on IV heparin for suspected new PE.  CTA was negative and she continues on anticoagulation for AFib.  Patient was then transitioned to Xarelto.  Now with AKI and IV heparin to resume.  Last Xarelto dose was on 05/14/17 around 1645.  CBC stable, no bleeding reported.   Goal of Therapy:  Heparin level 0.3-0.7 units/ml Monitor platelets by anticoagulation protocol: Yes    Plan:  Check baseline aPTT and heparin level At 1700, resume heparin gtt at 1100 units/hr Check 8 hr aPTT Daily heparin level, aPTT and CBC    Mychaela Lennartz D. Laney Potash, PharmD, BCPS Pager:  [phone] 05/15/2017, 9:26 AM

## 2017-05-15 NOTE — Care Management Note (Addendum)
Case Management Note  Patient Details  Name: Carrie Haynes MRN: [id number] Date of Birth: [birth year]  Subjective/Objective:   From home with spouse, pta indep,  presented for Chest Pain and Dyspnea- Found to be in Atrial Fib. Initiated on IV Heparin gtt and IV Diltiazem gtt. Plan for cardiac cath 05-12-17.   Pt has Medicare- and has no issues with purchasing medications. Patient will be on xarelto, she has the 30 day savings card,and her pharmacy has xarelto in stock.  co pay amount given to patient.                                 Action/Plan: NCM will follow for dc needs.   Expected Discharge Date:                  Expected Discharge Plan:  Acute to Acute Transfer  In-House Referral:  NA  Discharge planning Services  CM Consult  Post Acute Care Choice:  NA Choice offered to:  NA  DME Arranged:  N/A DME Agency:  NA  HH Arranged:  NA HH Agency:  NA  Status of Service:  Completed, signed off  If discussed at Long Length of Stay Meetings, dates discussed:    Additional Comments:  Leone Haven, RN 05/15/2017, 11:19 AM

## 2017-05-15 NOTE — Progress Notes (Addendum)
Progress Note  Patient Name: Carrie Haynes Date of Encounter: 05/15/2017  Primary Cardiologist: Dr. Tenny Craw  Subjective   Patient feeling better.  Off pressors.  HR stable in afib in the 80-90's  Inpatient Medications    Scheduled Meds: . atorvastatin  20 mg Oral QPM  . insulin aspart  0-15 Units Subcutaneous TID WC   Continuous Infusions: . sodium chloride Stopped (05/15/17 0300)  . heparin    . norepinephrine (LEVOPHED) Adult infusion Stopped (05/15/17 0100)   PRN Meds: acetaminophen, ondansetron (ZOFRAN) IV, oxyCODONE-acetaminophen   Vital Signs    Vitals:   05/15/17 0900 05/15/17 1000 05/15/17 1100 05/15/17 1134  BP: (!) 103/56 101/63 107/64   Pulse: 92 89 93   Resp: (!) 25 (!) 26 (!) 23   Temp:    97.7 F (36.5 C)  TempSrc:    Oral  SpO2: 97% 93% 95%   Weight:      Height:        Intake/Output Summary (Last 24 hours) at 05/15/17 1147 Last data filed at 05/15/17 0800  Gross per 24 hour  Intake           287.24 ml  Output              351 ml  Net           -63.76 ml   Filed Weights   05/11/17 0338 05/12/17 0345 05/14/17 0611  Weight: 223 lb 12.8 oz (101.5 kg) 222 lb 8 oz (100.9 kg) 221 lb 3.2 oz (100.3 kg)    Telemetry    Atrial fibrillation with CVR - Personally Reviewed  ECG    No new EKG to review - Personally Reviewed  Physical Exam   GEN: No acute distress.   Neck: No JVD Cardiac: irregularly irregular, no murmurs, rubs, or gallops.  Respiratory: Clear to auscultation bilaterally. GI: Soft, nontender, non-distended  MS: No edema; No deformity. Neuro:  Nonfocal  Psych: Normal affect   Labs    Chemistry Recent Labs Lab 05/11/17 0201 05/12/17 0635 05/15/17 0237  NA 133* 131* 132*  K 3.8 3.5 4.8  CL 100* 98* 98*  CO2 25 24 24   GLUCOSE 115* 121* 113*  BUN 26* 25* 36*  CREATININE 1.00 0.88 1.82*  CALCIUM 8.2* 8.4* 8.4*  GFRNONAA 55* >60 26*  GFRAA >60 >60 31*  ANIONGAP 8 9 10      Hematology Recent Labs Lab  05/13/17 0331 05/14/17 0256 05/15/17 0237  WBC 5.9 5.0 6.0  RBC 3.56* 3.54* 3.68*  HGB 10.4* 10.4* 10.7*  HCT 31.2* 31.2* 32.6*  MCV 87.6 88.1 88.6  MCH 29.2 29.4 29.1  MCHC 33.3 33.3 32.8  RDW 13.5 13.7 13.7  PLT 197 208 203    Cardiac Enzymes Recent Labs Lab 05/10/17 2033 05/11/17 0201 05/11/17 0722  TROPONINI <0.03 <0.03 <0.03    Recent Labs Lab 05/10/17 1248  TROPIPOC 0.01     BNP Recent Labs Lab 05/10/17 1235  BNP 115.2*     DDimer No results for input(s): DDIMER in the last 168 hours.   Radiology    No results found.  Cardiac Studies   2D echo 05/11/2017 Study Conclusions  - Left ventricle: The cavity size was normal. Wall thickness was   normal. Systolic function was mildly reduced. The estimated   ejection fraction was in the range of 45% to 50%. Mild diffuse   hypokinesis. Although no diagnostic regional wall motion   abnormality was identified, this possibility cannot be  completely   excluded on the basis of this study. - Ventricular septum: The contour showed diastolic flattening. - Mitral valve: There was at least moderate regurgitation directed   eccentrically and posteriorly. - Left atrium: The atrium was mildly dilated. - Right ventricle: The cavity size was mildly dilated. - Right atrium: The atrium was moderately dilated. - Tricuspid valve: There was moderate-severe regurgitation directed   centrally. A diagnosis of severe regurgitation is supported by   hepatic vein systolic flow reversal. - Pulmonary arteries: Systolic pressure was mildly increased. PA   peak pressure: 39 mm Hg (S).  Recommendations:  Consider transesophageal echocardiography if clinically indicated in order to better evaluate the severity and mechanism of mitral insufficiency. The severity of mitral insufficiency could be underestimated due to the eccentric nature of the jet and the technical limitations of the images. Cardiac Cath 04/2017 Conclusion     Right dominant coronary anatomy.  Normal coronary arteries.  Estimated left ventricular ejection fraction 35-40%, global hypokinesis, with elevated filling pressures consistent with acute on chronic combined systolic and diastolic heart failure.   RECOMMENDATIONS:   Consideration of tachycardia related left ventricular systolic dysfunction should be given.  Rate control and possible rhythm control of atrial fibrillation per primary team.      Patient Profile     76 yo female with PMH of NIDDM, HTN, and HL who presented with chest pain and dyspnea.  Assessment & Plan    1.  New onset Atrial fibrillation with RVR of unknown duration - HR controlled on no meds - starting  IV Heparin gtt for now and plan to transition to Xarelto once renal function improves - TSH normal  2.  HTN - BP well controlled and actually on the soft side but stable off pressors.   3.  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   - likely tachycardia induced and should improve with control of HR  4.  Acute on chronic combined systolic/diastolic CHF - she only put out 100cc urine yesterday but ? Accuracy of I&O's.  She is net neg 3L.  Creatinine bumped today (0.88>>1.82)  likely related to acute hypotension after cardizem and code with transient CPR. - hold further diuretics for now until renal function improves - strict I&O's - check BMET in am  She is stable off pressors.  Will transfer to tele bed.  I have spent a total of 35 minutes with patient reviewing hospital notes 2D echo , cath , telemetry, EKGs, labs and examining patient as well as establishing an assessment and plan that was discussed with the patient.  > 50% of time was spent in direct patient care.    Signed, Armanda Magic, MD  05/15/2017, 11:47 AM

## 2017-05-15 NOTE — Progress Notes (Deleted)
Discharge teaching complete. Meds, diet, follow up appointments, activity and symptoms reviewed and all questions answered. Copy of instructions given to patient and prescriptions sent to [facility] on [address]  

## 2017-05-16 LAB — GLUCOSE, CAPILLARY
GLUCOSE-CAPILLARY: 119 mg/dL — AB (ref 65–99)
GLUCOSE-CAPILLARY: 202 mg/dL — AB (ref 65–99)
GLUCOSE-CAPILLARY: 150 mg/dL — AB (ref 65–99)
Glucose-Capillary: 145 mg/dL — ABNORMAL HIGH (ref 65–99)
Glucose-Capillary: 137 mg/dL — ABNORMAL HIGH (ref 65–99)

## 2017-05-16 LAB — CBC
HEMATOCRIT: 31.5 % — AB (ref 36.0–46.0)
HEMOGLOBIN: 10.5 g/dL — AB (ref 12.0–15.0)
MCH: 29.4 pg (ref 26.0–34.0)
MCHC: 33.3 g/dL (ref 30.0–36.0)
MCV: 88.2 fL (ref 78.0–100.0)
Platelets: 219 10*3/uL (ref 150–400)
RBC: 3.57 MIL/uL — ABNORMAL LOW (ref 3.87–5.11)
RDW: 13.6 % (ref 11.5–15.5)
WBC: 6.2 10*3/uL (ref 4.0–10.5)

## 2017-05-16 LAB — BASIC METABOLIC PANEL
ANION GAP: 10 (ref 5–15)
BUN: 40 mg/dL — ABNORMAL HIGH (ref 6–20)
CALCIUM: 8.5 mg/dL — AB (ref 8.9–10.3)
CHLORIDE: 101 mmol/L (ref 101–111)
CO2: 24 mmol/L (ref 22–32)
Creatinine, Ser: 1.7 mg/dL — ABNORMAL HIGH (ref 0.44–1.00)
GFR calc non Af Amer: 29 mL/min — ABNORMAL LOW (ref >60)
GFR, EST AFRICAN AMERICAN: 33 mL/min — AB (ref >60)
Glucose, Bld: 112 mg/dL — ABNORMAL HIGH (ref 65–99)
Potassium: 3.9 mmol/L (ref 3.5–5.1)
SODIUM: 135 mmol/L (ref 135–145)

## 2017-05-16 LAB — APTT
APTT: 110 s — AB (ref 24–36)
APTT: 66 s — AB (ref 24–36)
APTT: 115 s — AB (ref 24–36)

## 2017-05-16 LAB — HEPARIN LEVEL (UNFRACTIONATED)

## 2017-05-16 MED ORDER — AMIODARONE HCL 200 MG PO TABS
200.0000 mg | ORAL_TABLET | Freq: Two times a day (BID) | ORAL | Status: DC
  Administered 2017-05-16 (×2): 200 mg via ORAL
  Filled 2017-05-16 (×2): qty 1

## 2017-05-16 NOTE — Care Management Note (Addendum)
Case Management Note  Patient Details  Name: Carrie Haynes MRN: [id number] Date of Birth: 1940-12-16  Subjective/Objective: Pt presented for New Onset Atrial Fib. Pt on IV Heparin and to be transitioned to po Xarelto.                   Action/Plan: Benefits Check completed for Xarelto:  S/W SHANTEL @ [facility] RX # [phone]     1. XARELTO  20 MG DAILY   COVER- YES  CO-PAY- $ 37.00 Q/L 1 PER DAY  TIER- 3 DRUG  PRIOR APPROVAL- NO   2. XARELTO 15 MG BID   COVER- YES  CO-PAY-$ 37.00 Q/L 2 PER DAY  TIER- 3 DRUG  PRIOR APPROVAL- NO   PREFERRED PHARMACY : [facility] AND [facility]   Expected Discharge Date:                  Expected Discharge Plan:  Home/Self Care  In-House Referral:  NA  Discharge planning Services  CM Consult  Post Acute Care Choice:   N/A Choice offered to:   N/A  DME Arranged:   N/A DME Agency:   N/A  HH Arranged:   N/A HH Agency:   N/A  Status of Service: COMPLETED If discussed at Long Length of Stay Meetings, dates discussed:    Additional Comments: 1051 05-17-17 Tomi Bamberger, RN,BSN [phone] Pt is from home with support of family. Pt has 30 day free Eliquis Card and she is aware of co pay. No further needs from CM at this time.  Gala Lewandowsky, RN 05/16/2017, 4:46 PM

## 2017-05-16 NOTE — Progress Notes (Signed)
ANTICOAGULATION CONSULT NOTE - Follow Up Consult  Pharmacy Consult for Xarelto>>Heparin Indication: atrial fibrillation  No Known Allergies  Patient Measurements: Height: 5' (152.4 cm) Weight: 223 lb (101.2 kg) IBW/kg (Calculated) : 45.5  Vital Signs: Temp: 97.7 F (36.5 C) (07/31 1142) Temp Source: Oral (07/31 1142) BP: 100/44 (07/31 1142) Pulse Rate: 62 (07/31 1142)  Labs:  Recent Labs  05/14/17 0256 05/15/17 0237 05/15/17 1500 05/16/17 0044 05/16/17 0502 05/16/17 0938  HGB 10.4* 10.7*  --   --  10.5*  --   HCT 31.2* 32.6*  --   --  31.5*  --   PLT 208 203  --   --  219  --   APTT  --   --  56* 115*  --  110*  HEPARINUNFRC  --   --  >2.20*  --   --  >2.20*  CREATININE  --  1.82*  --   --  1.70*  --     Estimated Creatinine Clearance: 31.5 mL/min (A) (by C-G formula based on SCr of 1.7 mg/dL (H)).   Assessment: Heparin while holding Xarelto during acute renal failure, aPTT is slightly elevated this AM, using aPTT to dose for now given Xarelto influence on anti-Xa levels. CHADsVASc =4.   Goal of Therapy:  Heparin level 0.3-0.7 units/ml aPTT 66-102 seconds Monitor platelets by anticoagulation protocol: Yes   Plan:  -Dec heparin to 800 units/hr -2000 aPTT -Daily heparin level, aPTT, CBC  Hershal Coria, PharmD, [facility] PGY1 Pharmacy Resident 05/16/2017,1:39 PM

## 2017-05-16 NOTE — Progress Notes (Signed)
Progress Note  Patient Name: Carrie Haynes Date of Encounter: 05/16/2017  Primary Cardiologist: Dr. Tenny Craw  Subjective   Denies any chest pain or pressure.  No SOB  Inpatient Medications    Scheduled Meds: . atorvastatin  20 mg Oral QPM  . insulin aspart  0-15 Units Subcutaneous TID WC   Continuous Infusions: . sodium chloride Stopped (05/15/17 0300)  . heparin 950 Units/hr (05/16/17 0227)   PRN Meds: acetaminophen, ondansetron (ZOFRAN) IV, oxyCODONE-acetaminophen   Vital Signs    Vitals:   05/15/17 1623 05/15/17 2100 05/16/17 0006 05/16/17 0446  BP: (!) 101/58 (!) 94/59 (!) 101/49 116/73  Pulse: 86 98 (!) 116 (!) 113  Resp: (!) 24 20 20  (!) 21  Temp: 98.3 F (36.8 C) 98.1 F (36.7 C) 98.4 F (36.9 C) 98.4 F (36.9 C)  TempSrc: Oral Oral Oral Oral  SpO2: 100% 100% 99% 90%  Weight:    223 lb (101.2 kg)  Height:        Intake/Output Summary (Last 24 hours) at 05/16/17 0720 Last data filed at 05/16/17 0600  Gross per 24 hour  Intake           256.03 ml  Output             2850 ml  Net         -2593.97 ml   Filed Weights   05/12/17 0345 05/14/17 0611 05/16/17 0446  Weight: 222 lb 8 oz (100.9 kg) 221 lb 3.2 oz (100.3 kg) 223 lb (101.2 kg)    Telemetry  Atrial fibrillation with RVR- Personally Reviewed  ECG    No new EKG to review - Personally Reviewed  Physical Exam   GEN: WD, WN in NAD  Neck: No JVD or bruit Cardiac: irregularly irregular and tachy with no M/R/G Respiratory: CTA bilaterally. GI: soft, NT, ND with active BS MS: no edema Neuro:  A&O x 3 Psych: normal affect  Labs    Chemistry  Recent Labs Lab 05/12/17 0635 05/15/17 0237 05/16/17 0502  NA 131* 132* 135  K 3.5 4.8 3.9  CL 98* 98* 101  CO2 24 24 24   GLUCOSE 121* 113* 112*  BUN 25* 36* 40*  CREATININE 0.88 1.82* 1.70*  CALCIUM 8.4* 8.4* 8.5*  GFRNONAA >60 26* 29*  GFRAA >60 31* 33*  ANIONGAP 9 10 10      Hematology  Recent Labs Lab 05/14/17 0256 05/15/17 0237  05/16/17 0502  WBC 5.0 6.0 6.2  RBC 3.54* 3.68* 3.57*  HGB 10.4* 10.7* 10.5*  HCT 31.2* 32.6* 31.5*  MCV 88.1 88.6 88.2  MCH 29.4 29.1 29.4  MCHC 33.3 32.8 33.3  RDW 13.7 13.7 13.6  PLT 208 203 219    Cardiac Enzymes  Recent Labs Lab 05/10/17 2033 05/11/17 0201 05/11/17 0722  TROPONINI <0.03 <0.03 <0.03     Recent Labs Lab 05/10/17 1248  TROPIPOC 0.01     BNP  Recent Labs Lab 05/10/17 1235  BNP 115.2*     DDimer No results for input(s): DDIMER in the last 168 hours.   Radiology    No results found.  Cardiac Studies   2D echo 05/11/2017 Study Conclusions  - Left ventricle: The cavity size was normal. Wall thickness was   normal. Systolic function was mildly reduced. The estimated   ejection fraction was in the range of 45% to 50%. Mild diffuse   hypokinesis. Although no diagnostic regional wall motion   abnormality was identified, this possibility cannot be completely  excluded on the basis of this study. - Ventricular septum: The contour showed diastolic flattening. - Mitral valve: There was at least moderate regurgitation directed   eccentrically and posteriorly. - Left atrium: The atrium was mildly dilated. - Right ventricle: The cavity size was mildly dilated. - Right atrium: The atrium was moderately dilated. - Tricuspid valve: There was moderate-severe regurgitation directed   centrally. A diagnosis of severe regurgitation is supported by   hepatic vein systolic flow reversal. - Pulmonary arteries: Systolic pressure was mildly increased. PA   peak pressure: 39 mm Hg (S).  Recommendations:  Consider transesophageal echocardiography if clinically indicated in order to better evaluate the severity and mechanism of mitral insufficiency. The severity of mitral insufficiency could be underestimated due to the eccentric nature of the jet and the technical limitations of the images. Cardiac Cath 04/2017 Conclusion    Right dominant coronary  anatomy.  Normal coronary arteries.  Estimated left ventricular ejection fraction 35-40%, global hypokinesis, with elevated filling pressures consistent with acute on chronic combined systolic and diastolic heart failure.   RECOMMENDATIONS:   Consideration of tachycardia related left ventricular systolic dysfunction should be given.  Rate control and possible rhythm control of atrial fibrillation per primary team.      Patient Profile     76 yo female with PMH of NIDDM, HTN, and HL who presented with chest pain and dyspnea.  Assessment & Plan    1.  New onset Atrial fibrillation with RVR of unknown duration - HR remains poorly controlled. - CANNOT USE CCB as she had asystole with Cardizem. - Will start low dose Amio 200mg  BID for rate control.   - continue  IV Heparin gtt for now and plan to transition to Xarelto once renal function improves - TSH normal  - if we cannot get HR controlled with Amio may need TEE/DCCV  2.  HTN - BP remains on soft side off pressors   3.  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   - this is likely a tachycardia mediated CM and should improve with HR control  4.  Acute on chronic combined systolic/diastolic CHF - she only put out 2.8L urine yesterday and is net neg 5.4L  - Creatinine bumped yesterday (0.88>>1.82)  likely related to acute hypotension after cardizem and code with transient CPR.  This am slightly improved at 1.7. - continue to hold further diuretics for now until renal function improves - check BMET in am  Signed, Armanda Magic, MD  05/16/2017, 7:20 AM

## 2017-05-16 NOTE — Progress Notes (Signed)
ANTICOAGULATION CONSULT NOTE - Follow Up Consult  Pharmacy Consult for Xarelto>>Heparin Indication: atrial fibrillation  No Known Allergies  Patient Measurements: Height: 5' (152.4 cm) Weight: 223 lb (101.2 kg) IBW/kg (Calculated) : 45.5  Vital Signs: Temp: 97.6 F (36.4 C) (07/31 2045) Temp Source: Oral (07/31 2045) BP: 104/50 (07/31 2045) Pulse Rate: 84 (07/31 2045)  Labs:  Recent Labs  05/14/17 0256 05/15/17 0237  05/15/17 1500 05/16/17 0044 05/16/17 0502 05/16/17 0938 05/16/17 2043  HGB 10.4* 10.7*  --   --   --  10.5*  --   --   HCT 31.2* 32.6*  --   --   --  31.5*  --   --   PLT 208 203  --   --   --  219  --   --   APTT  --   --   < > 56* 115*  --  110* 66*  HEPARINUNFRC  --   --   --  >2.20*  --   --  >2.20*  --   CREATININE  --  1.82*  --   --   --  1.70*  --   --   < > = values in this interval not displayed.  Estimated Creatinine Clearance: 31.5 mL/min (A) (by C-G formula based on SCr of 1.7 mg/dL (H)).   Assessment: 75 yof continuing on heparin for afib while holding Xarelto during acute renal failure. aPTT therapeutic but at bottom of range at 66s (previously supratherapeutic on 950 units/hr this AM) - using aPTT to dose for now given Xarelto influence on anti-Xa levels. CHADsVASc =4. CBC stable. No bleed or IV line issues per discussion with RN.  Goal of Therapy:  Heparin level 0.3-0.7 units/ml aPTT 66-102 seconds Monitor platelets by anticoagulation protocol: Yes   Plan:  -Increase heparin slightly to 850 units/hr to keep in range -Confirmatory aPTT with AM labs -Daily heparin level, aPTT, CBC -Monitor for s/sx bleeding -Xarelto on hold   Babs Bertin, PharmD, BCPS Clinical Pharmacist 05/16/2017 9:16 PM

## 2017-05-16 NOTE — Progress Notes (Signed)
ANTICOAGULATION CONSULT NOTE - Follow Up Consult  Pharmacy Consult for Xarelto>>Heparin Indication: atrial fibrillation  No Known Allergies  Patient Measurements: Height: 5' (152.4 cm) Weight: 221 lb 3.2 oz (100.3 kg) IBW/kg (Calculated) : 45.5  Vital Signs: Temp: 98.4 F (36.9 C) (07/31 0006) Temp Source: Oral (07/31 0006) BP: 101/49 (07/31 0006) Pulse Rate: 116 (07/31 0006)  Labs:  Recent Labs  05/13/17 0331 05/14/17 0256 05/15/17 0237 05/15/17 1500 05/16/17 0044  HGB 10.4* 10.4* 10.7*  --   --   HCT 31.2* 31.2* 32.6*  --   --   PLT 197 208 203  --   --   APTT  --   --   --  56* 115*  HEPARINUNFRC 0.32  --   --  >2.20*  --   CREATININE  --   --  1.82*  --   --     Estimated Creatinine Clearance: 29.3 mL/min (A) (by C-G formula based on SCr of 1.82 mg/dL (H)).   Assessment: Heparin while holding Xarelto during acute renal failure, aPTT is elevated this AM, using aPTT to dose for now given Xarelto influence on anti-Xa levels, no issues per RN.   Goal of Therapy:  Heparin level 0.3-0.7 units/ml aPTT 66-102 seconds Monitor platelets by anticoagulation protocol: Yes   Plan:  -Dec heparin to 950 units/hr -1000 aPTT/HL  Abran Duke 05/16/2017,2:18 AM

## 2017-05-16 NOTE — Care Management Important Message (Signed)
Important Message  Patient Details  Name: Carrie Haynes MRN: [id number] Date of Birth: 1941/09/11   Medicare Important Message Given:  Yes    Kyla Balzarine 05/16/2017, 9:48 AM

## 2017-05-17 DIAGNOSIS — I420 Dilated cardiomyopathy: Secondary | ICD-10-CM

## 2017-05-17 LAB — CBC
HEMATOCRIT: 29.5 % — AB (ref 36.0–46.0)
HEMOGLOBIN: 10 g/dL — AB (ref 12.0–15.0)
MCH: 29.9 pg (ref 26.0–34.0)
MCHC: 33.9 g/dL (ref 30.0–36.0)
MCV: 88.3 fL (ref 78.0–100.0)
Platelets: 230 10*3/uL (ref 150–400)
RBC: 3.34 MIL/uL — ABNORMAL LOW (ref 3.87–5.11)
RDW: 14 % (ref 11.5–15.5)
WBC: 5.4 10*3/uL (ref 4.0–10.5)

## 2017-05-17 LAB — GLUCOSE, CAPILLARY
Glucose-Capillary: 117 mg/dL — ABNORMAL HIGH (ref 65–99)
Glucose-Capillary: 228 mg/dL — ABNORMAL HIGH (ref 65–99)
Glucose-Capillary: 173 mg/dL — ABNORMAL HIGH (ref 65–99)
Glucose-Capillary: 190 mg/dL — ABNORMAL HIGH (ref 65–99)

## 2017-05-17 LAB — APTT: APTT: 69 s — AB (ref 24–36)

## 2017-05-17 LAB — HEPATIC FUNCTION PANEL
ALBUMIN: 2.9 g/dL — AB (ref 3.5–5.0)
ALT: 253 U/L — ABNORMAL HIGH (ref 14–54)
AST: 144 U/L — AB (ref 15–41)
Alkaline Phosphatase: 65 U/L (ref 38–126)
BILIRUBIN TOTAL: 1.1 mg/dL (ref 0.3–1.2)
Bilirubin, Direct: 0.3 mg/dL (ref 0.1–0.5)
Indirect Bilirubin: 0.8 mg/dL (ref 0.3–0.9)
Total Protein: 6 g/dL — ABNORMAL LOW (ref 6.5–8.1)

## 2017-05-17 LAB — HEPARIN LEVEL (UNFRACTIONATED): Heparin Unfractionated: >2.2 IU/mL — ABNORMAL HIGH (ref 0.30–0.70)

## 2017-05-17 MED ORDER — AMIODARONE HCL 200 MG PO TABS
400.0000 mg | ORAL_TABLET | Freq: Two times a day (BID) | ORAL | Status: DC
  Administered 2017-05-17 – 2017-05-18 (×3): 400 mg via ORAL
  Filled 2017-05-17 (×3): qty 2

## 2017-05-17 MED ORDER — HYDROCORTISONE 1 % EX CREA
1.0000 "application " | TOPICAL_CREAM | Freq: Three times a day (TID) | CUTANEOUS | Status: DC | PRN
  Filled 2017-05-17: qty 28

## 2017-05-17 MED ORDER — SODIUM CHLORIDE 0.9% FLUSH: 3.0000 mL | INTRAVENOUS | Status: DC | PRN

## 2017-05-17 MED ORDER — SODIUM CHLORIDE 0.9 % IV SOLN: INTRAVENOUS | Status: DC

## 2017-05-17 MED ORDER — GUAIFENESIN-DM 100-10 MG/5ML PO SYRP
5.0000 mL | ORAL_SOLUTION | ORAL | Status: DC | PRN
  Administered 2017-05-17 – 2017-05-22 (×11): 5 mL via ORAL
  Filled 2017-05-17 (×11): qty 5

## 2017-05-17 MED ORDER — SODIUM CHLORIDE 0.9 % IV SOLN: 250.0000 mL | INTRAVENOUS | Status: DC

## 2017-05-17 MED ORDER — SODIUM CHLORIDE 0.9% FLUSH: 3.0000 mL | Freq: Two times a day (BID) | INTRAVENOUS | Status: DC

## 2017-05-17 NOTE — Progress Notes (Signed)
Progress Note  Patient Name: Carrie Haynes Date of Encounter: 05/17/2017  Primary Cardiologist: Dr. Tenny Craw  Subjective   Went back into atrial fibrillation with RVR.    Inpatient Medications    Scheduled Meds: . amiodarone  200 mg Oral BID  . atorvastatin  20 mg Oral QPM  . insulin aspart  0-15 Units Subcutaneous TID WC   Continuous Infusions: . sodium chloride Stopped (05/15/17 0300)  . heparin 850 Units/hr (05/16/17 2205)   PRN Meds: acetaminophen, ondansetron (ZOFRAN) IV, oxyCODONE-acetaminophen   Vital Signs    Vitals:   05/16/17 2045 05/17/17 0010 05/17/17 0431 05/17/17 0826  BP: (!) 104/50 111/66 (!) 128/49 124/68  Pulse: 84 95 (!) 124 (!) 114  Resp: 18 18 18 18   Temp: 97.6 F (36.4 C) 98.6 F (37 C) 98.6 F (37 C) 98.5 F (36.9 C)  TempSrc: Oral Oral Oral Oral  SpO2: 95% 97% 92% 97%  Weight:   223 lb (101.2 kg)   Height:        Intake/Output Summary (Last 24 hours) at 05/17/17 0832 Last data filed at 05/17/17 0700  Gross per 24 hour  Intake              700 ml  Output             2450 ml  Net            -1750 ml   Filed Weights   05/14/17 0611 05/16/17 0446 05/17/17 0431  Weight: 221 lb 3.2 oz (100.3 kg) 223 lb (101.2 kg) 223 lb (101.2 kg)    Telemetry  Atrial fibrillation with RVR- Personally Reviewed  ECG    No new EKG to review - Personally Reviewed  Physical Exam   GEN: WD, WN in NAD Neck: no bruit Cardiac: irregularly irregular and tachy Respiratory: CTA bilaterally GI: soft, NT, ND MS: no edema Neuro:  A&O x 3 Psych: normal affect  Labs    Chemistry  Recent Labs Lab 05/12/17 0635 05/15/17 0237 05/16/17 0502  NA 131* 132* 135  K 3.5 4.8 3.9  CL 98* 98* 101  CO2 24 24 24   GLUCOSE 121* 113* 112*  BUN 25* 36* 40*  CREATININE 0.88 1.82* 1.70*  CALCIUM 8.4* 8.4* 8.5*  GFRNONAA >60 26* 29*  GFRAA >60 31* 33*  ANIONGAP 9 10 10      Hematology  Recent Labs Lab 05/15/17 0237 05/16/17 0502 05/17/17 0215  WBC 6.0  6.2 5.4  RBC 3.68* 3.57* 3.34*  HGB 10.7* 10.5* 10.0*  HCT 32.6* 31.5* 29.5*  MCV 88.6 88.2 88.3  MCH 29.1 29.4 29.9  MCHC 32.8 33.3 33.9  RDW 13.7 13.6 14.0  PLT 203 219 230    Cardiac Enzymes  Recent Labs Lab 05/10/17 2033 05/11/17 0201 05/11/17 0722  TROPONINI <0.03 <0.03 <0.03     Recent Labs Lab 05/10/17 1248  TROPIPOC 0.01     BNP  Recent Labs Lab 05/10/17 1235  BNP 115.2*     DDimer No results for input(s): DDIMER in the last 168 hours.   Radiology    No results found.  Cardiac Studies   2D echo 05/11/2017 Study Conclusions  - Left ventricle: The cavity size was normal. Wall thickness was   normal. Systolic function was mildly reduced. The estimated   ejection fraction was in the range of 45% to 50%. Mild diffuse   hypokinesis. Although no diagnostic regional wall motion   abnormality was identified, this possibility cannot be completely  excluded on the basis of this study. - Ventricular septum: The contour showed diastolic flattening. - Mitral valve: There was at least moderate regurgitation directed   eccentrically and posteriorly. - Left atrium: The atrium was mildly dilated. - Right ventricle: The cavity size was mildly dilated. - Right atrium: The atrium was moderately dilated. - Tricuspid valve: There was moderate-severe regurgitation directed   centrally. A diagnosis of severe regurgitation is supported by   hepatic vein systolic flow reversal. - Pulmonary arteries: Systolic pressure was mildly increased. PA   peak pressure: 39 mm Hg (S).  Recommendations:  Consider transesophageal echocardiography if clinically indicated in order to better evaluate the severity and mechanism of mitral insufficiency. The severity of mitral insufficiency could be underestimated due to the eccentric nature of the jet and the technical limitations of the images. Cardiac Cath 04/2017 Conclusion    Right dominant coronary anatomy.  Normal coronary  arteries.  Estimated left ventricular ejection fraction 35-40%, global hypokinesis, with elevated filling pressures consistent with acute on chronic combined systolic and diastolic heart failure.   RECOMMENDATIONS:   Consideration of tachycardia related left ventricular systolic dysfunction should be given.  Rate control and possible rhythm control of atrial fibrillation per primary team.      Patient Profile     76 yo female with PMH of NIDDM, HTN, and HL who presented with chest pain and dyspnea.  Assessment & Plan    1.  New onset Atrial fibrillation with RVR of unknown duration - HR remains poorly controlled in the 120's at times - CANNOT USE CCB as she had asystole with Cardizem. - Will increase Amio 400mg  BID for rate control.   - continue  IV Heparin gtt for now and plan to transition to Xarelto once renal function improves.  Creatinine pending this am.  - TSH normal  - if we cannot get HR controlled with Amio may need TEE/DCCV.  Will reassess later today and if HR still elevated then will make NPO after MN for procedure.  2.  HTN - BP remains controlled   3.  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   - this is likely a tachycardia mediated CM and should improve with HR control  4.  Acute on chronic combined systolic/diastolic CHF - she only put out 2.4L urine yesterday and is net neg 7.2L  - Creatinine stable  yesterday (0.88>>1.82>>1.7)  likely related to acute hypotension after cardizem and code with transient CPR.  Creatinine pending this am.  - continue to hold further diuretics for now until renal function improves - check BMET in am  Signed, Armanda Magic, MD  05/17/2017, 8:32 AM

## 2017-05-17 NOTE — Progress Notes (Signed)
ANTICOAGULATION CONSULT NOTE - Follow Up Consult  Pharmacy Consult for Xarelto>>Heparin Indication: atrial fibrillation  Allergies  Allergen Reactions  . Calcium Channel Blockers Other (See Comments)    Acute hypotension, patient became brady/asystole < 5 seconds    Patient Measurements: Height: 5' (152.4 cm) Weight: 223 lb (101.2 kg) IBW/kg (Calculated) : 45.5  Vital Signs: Temp: 98.5 F (36.9 C) (08/01 0826) Temp Source: Oral (08/01 0826) BP: 124/68 (08/01 0826) Pulse Rate: 114 (08/01 0826)  Labs:  Recent Labs  05/15/17 0237 05/15/17 1500  05/16/17 0502 05/16/17 0938 05/16/17 2043 05/17/17 0215  HGB 10.7*  --   --  10.5*  --   --  10.0*  HCT 32.6*  --   --  31.5*  --   --  29.5*  PLT 203  --   --  219  --   --  230  APTT  --  56*  < >  --  110* 66* 69*  HEPARINUNFRC  --  >2.20*  --   --  >2.20*  --  >2.20*  CREATININE 1.82*  --   --  1.70*  --   --   --   < > = values in this interval not displayed.  Estimated Creatinine Clearance: 31.5 mL/min (A) (by C-G formula based on SCr of 1.7 mg/dL (H)).   Assessment: 75 yof continuing on heparin for afib while holding Xarelto during acute renal failure. Per MD- Plans are for DCCV if patient doesn't convert.  -aPTT= 69, heparin level still > 2.2 -hg and pltc stable  Goal of Therapy:  Heparin level 0.3-0.7 units/ml aPTT 66-102 seconds Monitor platelets by anticoagulation protocol: Yes   Plan:  -No heparin changes needed -Daily heparin level, CBC and aPTT  Harland German, Pharm D 05/17/2017 9:45 AM

## 2017-05-17 NOTE — Progress Notes (Signed)
    [facility] has been requested to perform a transesophageal echocardiogram on Carrie Haynes for DCCV.  After careful review of history and examination, the risks and benefits of transesophageal echocardiogram have been explained including risks of esophageal damage, perforation (1:10,000 risk), bleeding, pharyngeal hematoma as well as other potential complications associated with conscious sedation including aspiration, arrhythmia, respiratory failure and death. Alternatives to treatment were discussed, questions were answered. Patient is willing to proceed.   Pt is scheduled for TEE and DCCV tomorrow 05/18/17 at 12:00 noon with Dr. Eden Emms.  Roe Rutherford Duke, [place]  05/17/2017 12:03 PM

## 2017-05-17 NOTE — Plan of Care (Signed)
Problem: Physical Regulation: Goal: Ability to maintain clinical measurements within normal limits will improve Outcome: Not Progressing Patient's HR still reaching 120-130's with activity. Remains in Afib. Heparin increased to 8.5 cc/hr this shift.

## 2017-05-18 ENCOUNTER — Encounter (HOSPITAL_COMMUNITY)
Admission: EM | Disposition: A | Discharge status: Home / Self Care | Payer: Self-pay | Source: Home / Self Care | Attending: Internal Medicine

## 2017-05-18 ENCOUNTER — Ambulatory Visit (HOSPITAL_COMMUNITY): Payer: Medicare Other

## 2017-05-18 ENCOUNTER — Inpatient Hospital Stay (HOSPITAL_COMMUNITY): Payer: Medicare Other | Admitting: Certified Registered"

## 2017-05-18 ENCOUNTER — Encounter (HOSPITAL_COMMUNITY): Payer: Self-pay | Admitting: Certified Registered"

## 2017-05-18 DIAGNOSIS — I481 Persistent atrial fibrillation: Principal | ICD-10-CM

## 2017-05-18 DIAGNOSIS — I340 Nonrheumatic mitral (valve) insufficiency: Secondary | ICD-10-CM

## 2017-05-18 DIAGNOSIS — I4891 Unspecified atrial fibrillation: Secondary | ICD-10-CM

## 2017-05-18 HISTORY — PX: CARDIOVERSION: SHX1299

## 2017-05-18 HISTORY — PX: TEE WITHOUT CARDIOVERSION: SHX5443

## 2017-05-18 LAB — CBC
HCT: 33.2 % — ABNORMAL LOW (ref 36.0–46.0)
Hemoglobin: 10.7 g/dL — ABNORMAL LOW (ref 12.0–15.0)
MCH: 29 pg (ref 26.0–34.0)
MCHC: 32.2 g/dL (ref 30.0–36.0)
MCV: 90 fL (ref 78.0–100.0)
PLATELETS: 252 10*3/uL (ref 150–400)
RBC: 3.69 MIL/uL — ABNORMAL LOW (ref 3.87–5.11)
RDW: 14.2 % (ref 11.5–15.5)
WBC: 5.8 10*3/uL (ref 4.0–10.5)

## 2017-05-18 LAB — HEPARIN LEVEL (UNFRACTIONATED): HEPARIN UNFRACTIONATED: 0.84 [IU]/mL — AB (ref 0.30–0.70)

## 2017-05-18 LAB — BASIC METABOLIC PANEL
ANION GAP: 9 (ref 5–15)
BUN: 24 mg/dL — ABNORMAL HIGH (ref 6–20)
CHLORIDE: 106 mmol/L (ref 101–111)
CO2: 25 mmol/L (ref 22–32)
Calcium: 9.1 mg/dL (ref 8.9–10.3)
Creatinine, Ser: 1.3 mg/dL — ABNORMAL HIGH (ref 0.44–1.00)
GFR calc non Af Amer: 40 mL/min — ABNORMAL LOW (ref >60)
GFR, EST AFRICAN AMERICAN: 46 mL/min — AB (ref >60)
Glucose, Bld: 118 mg/dL — ABNORMAL HIGH (ref 65–99)
Potassium: 4.4 mmol/L (ref 3.5–5.1)
SODIUM: 140 mmol/L (ref 135–145)

## 2017-05-18 LAB — GLUCOSE, CAPILLARY
GLUCOSE-CAPILLARY: 129 mg/dL — AB (ref 65–99)
Glucose-Capillary: 113 mg/dL — ABNORMAL HIGH (ref 65–99)
Glucose-Capillary: 215 mg/dL — ABNORMAL HIGH (ref 65–99)
Glucose-Capillary: 144 mg/dL — ABNORMAL HIGH (ref 65–99)

## 2017-05-18 LAB — APTT
APTT: 55 s — AB (ref 24–36)
APTT: 71 s — AB (ref 24–36)

## 2017-05-18 SURGERY — ECHOCARDIOGRAM, TRANSESOPHAGEAL
Anesthesia: General

## 2017-05-18 MED ORDER — AMIODARONE HCL IN DEXTROSE 360-4.14 MG/200ML-% IV SOLN
30.0000 mg/h | INTRAVENOUS | Status: DC
  Administered 2017-05-18 – 2017-05-21 (×7): 30 mg/h via INTRAVENOUS
  Filled 2017-05-18 (×5): qty 200

## 2017-05-18 MED ORDER — RIVAROXABAN 20 MG PO TABS
20.0000 mg | ORAL_TABLET | Freq: Every day | ORAL | Status: DC
  Administered 2017-05-18 – 2017-05-22 (×5): 20 mg via ORAL
  Filled 2017-05-18 (×5): qty 1

## 2017-05-18 MED ORDER — LIDOCAINE 2% (20 MG/ML) 5 ML SYRINGE
INTRAMUSCULAR | Status: DC | PRN
  Administered 2017-05-18: 50 mg via INTRAVENOUS

## 2017-05-18 MED ORDER — AMIODARONE HCL IN DEXTROSE 360-4.14 MG/200ML-% IV SOLN
60.0000 mg/h | INTRAVENOUS | Status: AC
  Filled 2017-05-18 (×2): qty 200

## 2017-05-18 MED ORDER — AMIODARONE LOAD VIA INFUSION
150.0000 mg | Freq: Once | INTRAVENOUS | Status: AC
  Administered 2017-05-18: 150 mg via INTRAVENOUS
  Filled 2017-05-18: qty 83.34

## 2017-05-18 MED ORDER — PROPOFOL 500 MG/50ML IV EMUL
INTRAVENOUS | Status: DC | PRN
  Administered 2017-05-18: 75 ug/kg/min via INTRAVENOUS

## 2017-05-18 MED ORDER — BUTAMBEN-TETRACAINE-BENZOCAINE 2-2-14 % EX AERO
INHALATION_SPRAY | CUTANEOUS | Status: DC | PRN
  Administered 2017-05-18: 2 via TOPICAL

## 2017-05-18 MED ORDER — GLYCOPYRROLATE 0.2 MG/ML IJ SOLN
INTRAMUSCULAR | Status: DC | PRN
  Administered 2017-05-18: 0.1 mg via INTRAVENOUS

## 2017-05-18 MED ORDER — PROPOFOL 10 MG/ML IV BOLUS
INTRAVENOUS | Status: DC | PRN
  Administered 2017-05-18: 80 mg via INTRAVENOUS
  Administered 2017-05-18: 20 mg via INTRAVENOUS

## 2017-05-18 NOTE — Progress Notes (Signed)
Progress Note  Patient Name: Carrie Haynes Date of Encounter: 05/18/2017  Primary Cardiologist: Dr. Tenny Craw  Subjective   No complaints of CP or SOB.  Inpatient Medications    Scheduled Meds: . amiodarone  400 mg Oral BID  . atorvastatin  20 mg Oral QPM  . insulin aspart  0-15 Units Subcutaneous TID WC  . sodium chloride flush  3 mL Intravenous Q12H   Continuous Infusions: . sodium chloride Stopped (05/15/17 0300)  . sodium chloride    . sodium chloride    . heparin 950 Units/hr (05/18/17 0452)   PRN Meds: acetaminophen, guaiFENesin-dextromethorphan, hydrocortisone cream, ondansetron (ZOFRAN) IV, oxyCODONE-acetaminophen, sodium chloride flush   Vital Signs    Vitals:   05/17/17 1700 05/17/17 2035 05/18/17 0040 05/18/17 0332  BP: (!) 111/48 124/68 117/62 (!) 142/80  Pulse: 86 92 93 (!) 107  Resp: 18 18 18 18   Temp: 98.4 F (36.9 C) 98 F (36.7 C) 98.4 F (36.9 C) 97.8 F (36.6 C)  TempSrc: Oral Oral Oral Oral  SpO2: 97% 96% 99% 99%  Weight:    224 lb (101.6 kg)  Height:        Intake/Output Summary (Last 24 hours) at 05/18/17 0814 Last data filed at 05/18/17 0300  Gross per 24 hour  Intake          1225.71 ml  Output             1000 ml  Net           225.71 ml   Filed Weights   05/16/17 0446 05/17/17 0431 05/18/17 0332  Weight: 223 lb (101.2 kg) 223 lb (101.2 kg) 224 lb (101.6 kg)    Telemetry    Atrial fibrillation with RVR in the 110-120's - Personally Reviewed  ECG    Atrial fibrillation with RVR - Personally Reviewed  Physical Exam   GEN: No acute distress.   Neck: No JVD Cardiac: irregularly irregular and tachy, no murmurs, rubs, or gallops.  Respiratory: Clear to auscultation bilaterally. GI: Soft, nontender, non-distended  MS: No edema; No deformity. Neuro:  Nonfocal  Psych: Normal affect   Labs    Chemistry Recent Labs Lab 05/12/17 0635 05/15/17 0237 05/16/17 0502 05/17/17 0855  NA 131* 132* 135  --   K 3.5 4.8 3.9  --     CL 98* 98* 101  --   CO2 24 24 24   --   GLUCOSE 121* 113* 112*  --   BUN 25* 36* 40*  --   CREATININE 0.88 1.82* 1.70*  --   CALCIUM 8.4* 8.4* 8.5*  --   PROT  --   --   --  6.0*  ALBUMIN  --   --   --  2.9*  AST  --   --   --  144*  ALT  --   --   --  253*  ALKPHOS  --   --   --  65  BILITOT  --   --   --  1.1  GFRNONAA >60 26* 29*  --   GFRAA >60 31* 33*  --   ANIONGAP 9 10 10   --      Hematology Recent Labs Lab 05/16/17 0502 05/17/17 0215 05/18/17 0327  WBC 6.2 5.4 5.8  RBC 3.57* 3.34* 3.69*  HGB 10.5* 10.0* 10.7*  HCT 31.5* 29.5* 33.2*  MCV 88.2 88.3 90.0  MCH 29.4 29.9 29.0  MCHC 33.3 33.9 32.2  RDW 13.6 14.0 14.2  PLT 219 230 252    Cardiac EnzymesNo results for input(s): TROPONINI in the last 168 hours. No results for input(s): TROPIPOC in the last 168 hours.   BNPNo results for input(s): BNP, PROBNP in the last 168 hours.   DDimer No results for input(s): DDIMER in the last 168 hours.   Radiology    No results found.  Cardiac Studies   2D echo 05/11/2017 Study Conclusions  - Left ventricle: The cavity size was normal. Wall thickness was normal. Systolic function was mildly reduced. The estimated ejection fraction was in the range of 45% to 50%. Mild diffuse hypokinesis. Although no diagnostic regional wall motion abnormality was identified, this possibility cannot be completely excluded on the basis of this study. - Ventricular septum: The contour showed diastolic flattening. - Mitral valve: There was at least moderate regurgitation directed eccentrically and posteriorly. - Left atrium: The atrium was mildly dilated. - Right ventricle: The cavity size was mildly dilated. - Right atrium: The atrium was moderately dilated. - Tricuspid valve: There was moderate-severe regurgitation directed centrally. A diagnosis of severe regurgitation is supported by hepatic vein systolic flow reversal. - Pulmonary arteries: Systolic pressure  was mildly increased. PA peak pressure: 39 mm Hg (S).  Recommendations: Consider transesophageal echocardiography if clinically indicated in order to better evaluate the severity and mechanism of mitral insufficiency. The severity of mitral insufficiency could be underestimated due to the eccentric nature of the jet and the technical limitations of the images. Cardiac Cath 04/2017 Conclusion    Right dominant coronary anatomy.  Normal coronary arteries.  Estimated left ventricular ejection fraction 35-40%, global hypokinesis, with elevated filling pressures consistent with acute on chronic combined systolic and diastolic heart failure.   RECOMMENDATIONS:   Consideration of tachycardia related left ventricular systolic dysfunction should be given.  Rate control and possible rhythm control of atrial fibrillation per primary team.      Patient Profile     76 y.o. female with PMH of NIDDM, HTN, and HL who presented with chest pain and dyspnea.  Assessment & Plan    1.  New onset Atrial fibrillation with RVR of unknown duration - HR remains poorly controlled in the 120's this am despite increasing amio to 400mg  BID. - CANNOT USE CCB as she had asystole with Cardizem. - Continue Amio 400mg  BID  - continue  IV Heparin gtt for now and plan to transition to Xarelto after Cardioversion.  Creatinine pending this am.  - TSH normal  - As heart rate remains poorly controlled and likely has a tachycardia induced DCM will proceed with TEE/DCCV today.  She is NPO this am.   - will set up outpt sleep study  2.  HTN - BP remains controlled   3.  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   - this is likely a tachycardia mediated CM and should improve once NSR has been established.  4.  Acute on chronic combined systolic/diastolic CHF - she only put out 1L urine yesterday and is net neg 6.7L  - Creatinine has been  elevated over the past few days (0.88>>1.82>>1.7)  likely related to acute hypotension after cardizem and code with transient CPR.  Creatinine pending this am.  - continue to hold further diuretics for now until renal function improves - stat BMET this am  Signed, Armanda Magic, MD  05/18/2017, 8:14 AM

## 2017-05-18 NOTE — Care Management Important Message (Signed)
Important Message  Patient Details  Name: Carrie Haynes MRN: [id number] Date of Birth: 06-20-1941   Medicare Important Message Given:  Yes    Kyla Balzarine 05/18/2017, 10:25 AM

## 2017-05-18 NOTE — Anesthesia Preprocedure Evaluation (Addendum)
Anesthesia Evaluation  Patient identified by MRN, date of birth, ID band Patient awake    Reviewed: Allergy & Precautions, H&P , NPO status , Patient's Chart, lab work & pertinent test results  History of Anesthesia Complications (+) PROLONGED EMERGENCE  Airway Mallampati: III  TM Distance: >3 FB Neck ROM: Full    Dental no notable dental hx. (+) Dental Advisory Given, Partial Upper   Pulmonary neg pulmonary ROS,    Pulmonary exam normal breath sounds clear to auscultation       Cardiovascular hypertension, Pt. on medications + dysrhythmias Atrial Fibrillation  Rhythm:Irregular Rate:Normal     Neuro/Psych negative neurological ROS  negative psych ROS   GI/Hepatic negative GI ROS, Neg liver ROS,   Endo/Other  diabetes, Type 2, Oral Hypoglycemic AgentsMorbid obesity  Renal/GU negative Renal ROS  negative genitourinary   Musculoskeletal   Abdominal   Peds  Hematology negative hematology ROS (+)   Anesthesia Other Findings   Reproductive/Obstetrics negative OB ROS                            Anesthesia Physical Anesthesia Plan  ASA: III  Anesthesia Plan: General   Post-op Pain Management:    Induction: Intravenous  PONV Risk Score and Plan: 3 and Propofol infusion and Treatment may vary due to age or medical condition  Airway Management Planned: Nasal Cannula  Additional Equipment:   Intra-op Plan:   Post-operative Plan:   Informed Consent: I have reviewed the patients History and Physical, chart, labs and discussed the procedure including the risks, benefits and alternatives for the proposed anesthesia with the patient or authorized representative who has indicated his/her understanding and acceptance.   Dental advisory given  Plan Discussed with: CRNA  Anesthesia Plan Comments:         Anesthesia Quick Evaluation

## 2017-05-18 NOTE — Interval H&P Note (Signed)
History and Physical Interval Note:  05/18/2017 10:42 AM  Carrie Haynes  has presented today for surgery, with the diagnosis of A-FIB  The various methods of treatment have been discussed with the patient and family. After consideration of risks, benefits and other options for treatment, the patient has consented to  Procedure(s): TRANSESOPHAGEAL ECHOCARDIOGRAM (TEE) (N/A) CARDIOVERSION (N/A) as a surgical intervention .  The patient's history has been reviewed, patient examined, no change in status, stable for surgery.  I have reviewed the patient's chart and labs.  Questions were answered to the patient's satisfaction.     Charlton Haws

## 2017-05-18 NOTE — Anesthesia Postprocedure Evaluation (Signed)
Anesthesia Post Note  Patient: Carrie Haynes  Procedure(s) Performed: Procedure(s) (LRB): TRANSESOPHAGEAL ECHOCARDIOGRAM (TEE) (N/A) CARDIOVERSION (N/A)     Patient location during evaluation: PACU Anesthesia Type: General Level of consciousness: awake and alert Pain management: pain level controlled Vital Signs Assessment: post-procedure vital signs reviewed and stable Respiratory status: spontaneous breathing, nonlabored ventilation and respiratory function stable Cardiovascular status: blood pressure returned to baseline and stable Postop Assessment: no signs of nausea or vomiting Anesthetic complications: no    Last Vitals:  Vitals:   05/18/17 1210 05/18/17 1215  BP:  118/70  Pulse: (!) 108 (!) 101  Resp: (!) 21 (!) 24  Temp:      Last Pain:  Vitals:   05/18/17 1137  TempSrc: Oral  PainSc:                  Meri Pelot,W. EDMOND

## 2017-05-18 NOTE — Progress Notes (Addendum)
Pt is scheduled for DCCV on Tuesay, 05/23/17 at 9:00AM with Dr. Mayford Knife. No TEE needed. NPO at [facility] Monday night. Orders entered.   Carrie Rutherford Rindi Beechy, PA-C 05/18/2017, 2:53 PM [phone] [facility]

## 2017-05-18 NOTE — CV Procedure (Signed)
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

## 2017-05-18 NOTE — Progress Notes (Signed)
ANTICOAGULATION CONSULT NOTE - Follow Up Consult  Pharmacy Consult for heparin Indication: atrial fibrillation  Labs:  Recent Labs  05/16/17 0502 05/16/17 0938 05/16/17 2043 05/17/17 0215 05/18/17 0327  HGB 10.5*  --   --  10.0* 10.7*  HCT 31.5*  --   --  29.5* 33.2*  PLT 219  --   --  230 252  APTT  --  110* 66* 69* 55*  HEPARINUNFRC  --  >2.20*  --  >2.20* 0.84*  CREATININE 1.70*  --   --   --   --     Assessment: 76yo female now subtherapeutic on heparin after one PTT at low end of goal.  Goal of Therapy:  aPTT 66-102 seconds   Plan:  Will increase heparin gtt by 1 unit/kg/hr to 950 units/hr and check PTT in 8hr.  Vernard Gambles, PharmD, BCPS  05/18/2017,4:32 AM

## 2017-05-18 NOTE — H&P (View-Only) (Signed)
 Progress Note  Patient Name: Carrie Haynes Date of Encounter: 05/18/2017  Primary Cardiologist: Dr. Ross  Subjective   No complaints of CP or SOB.  Inpatient Medications    Scheduled Meds: . amiodarone  400 mg Oral BID  . atorvastatin  20 mg Oral QPM  . insulin aspart  0-15 Units Subcutaneous TID WC  . sodium chloride flush  3 mL Intravenous Q12H   Continuous Infusions: . sodium chloride Stopped (05/15/17 0300)  . sodium chloride    . sodium chloride    . heparin 950 Units/hr (05/18/17 0452)   PRN Meds: acetaminophen, guaiFENesin-dextromethorphan, hydrocortisone cream, ondansetron (ZOFRAN) IV, oxyCODONE-acetaminophen, sodium chloride flush   Vital Signs    Vitals:   05/17/17 1700 05/17/17 2035 05/18/17 0040 05/18/17 0332  BP: (!) 111/48 124/68 117/62 (!) 142/80  Pulse: 86 92 93 (!) 107  Resp: 18 18 18 18  Temp: 98.4 F (36.9 C) 98 F (36.7 C) 98.4 F (36.9 C) 97.8 F (36.6 C)  TempSrc: Oral Oral Oral Oral  SpO2: 97% 96% 99% 99%  Weight:    224 lb (101.6 kg)  Height:        Intake/Output Summary (Last 24 hours) at 05/18/17 0814 Last data filed at 05/18/17 0300  Gross per 24 hour  Intake          1225.71 ml  Output             1000 ml  Net           225.71 ml   Filed Weights   05/16/17 0446 05/17/17 0431 05/18/17 0332  Weight: 223 lb (101.2 kg) 223 lb (101.2 kg) 224 lb (101.6 kg)    Telemetry    Atrial fibrillation with RVR in the 110-120's - Personally Reviewed  ECG    Atrial fibrillation with RVR - Personally Reviewed  Physical Exam   GEN: No acute distress.   Neck: No JVD Cardiac: irregularly irregular and tachy, no murmurs, rubs, or gallops.  Respiratory: Clear to auscultation bilaterally. GI: Soft, nontender, non-distended  MS: No edema; No deformity. Neuro:  Nonfocal  Psych: Normal affect   Labs    Chemistry Recent Labs Lab 05/12/17 0635 05/15/17 0237 05/16/17 0502 05/17/17 0855  NA 131* 132* 135  --   K 3.5 4.8 3.9  --     CL 98* 98* 101  --   CO2 24 24 24  --   GLUCOSE 121* 113* 112*  --   BUN 25* 36* 40*  --   CREATININE 0.88 1.82* 1.70*  --   CALCIUM 8.4* 8.4* 8.5*  --   PROT  --   --   --  6.0*  ALBUMIN  --   --   --  2.9*  AST  --   --   --  144*  ALT  --   --   --  253*  ALKPHOS  --   --   --  65  BILITOT  --   --   --  1.1  GFRNONAA >60 26* 29*  --   GFRAA >60 31* 33*  --   ANIONGAP 9 10 10  --      Hematology Recent Labs Lab 05/16/17 0502 05/17/17 0215 05/18/17 0327  WBC 6.2 5.4 5.8  RBC 3.57* 3.34* 3.69*  HGB 10.5* 10.0* 10.7*  HCT 31.5* 29.5* 33.2*  MCV 88.2 88.3 90.0  MCH 29.4 29.9 29.0  MCHC 33.3 33.9 32.2  RDW 13.6 14.0 14.2    PLT 219 230 252    Cardiac EnzymesNo results for input(s): TROPONINI in the last 168 hours. No results for input(s): TROPIPOC in the last 168 hours.   BNPNo results for input(s): BNP, PROBNP in the last 168 hours.   DDimer No results for input(s): DDIMER in the last 168 hours.   Radiology    No results found.  Cardiac Studies   2D echo 05/11/2017 Study Conclusions  - Left ventricle: The cavity size was normal. Wall thickness was normal. Systolic function was mildly reduced. The estimated ejection fraction was in the range of 45% to 50%. Mild diffuse hypokinesis. Although no diagnostic regional wall motion abnormality was identified, this possibility cannot be completely excluded on the basis of this study. - Ventricular septum: The contour showed diastolic flattening. - Mitral valve: There was at least moderate regurgitation directed eccentrically and posteriorly. - Left atrium: The atrium was mildly dilated. - Right ventricle: The cavity size was mildly dilated. - Right atrium: The atrium was moderately dilated. - Tricuspid valve: There was moderate-severe regurgitation directed centrally. A diagnosis of severe regurgitation is supported by hepatic vein systolic flow reversal. - Pulmonary arteries: Systolic pressure  was mildly increased. PA peak pressure: 39 mm Hg (S).  Recommendations: Consider transesophageal echocardiography if clinically indicated in order to better evaluate the severity and mechanism of mitral insufficiency. The severity of mitral insufficiency could be underestimated due to the eccentric nature of the jet and the technical limitations of the images. Cardiac Cath 04/2017 Conclusion    Right dominant coronary anatomy.  Normal coronary arteries.  Estimated left ventricular ejection fraction 35-40%, global hypokinesis, with elevated filling pressures consistent with acute on chronic combined systolic and diastolic heart failure.   RECOMMENDATIONS:   Consideration of tachycardia related left ventricular systolic dysfunction should be given.  Rate control and possible rhythm control of atrial fibrillation per primary team.      Patient Profile     75 y.o. female with PMH of NIDDM, HTN, and HL who presented with chest pain and dyspnea.  Assessment & Plan    1.  New onset Atrial fibrillation with RVR of unknown duration - HR remains poorly controlled in the 120's this am despite increasing amio to 400mg BID. - CANNOT USE CCB as she had asystole with Cardizem. - Continue Amio 400mg BID  - continue  IV Heparin gtt for now and plan to transition to Xarelto after Cardioversion.  Creatinine pending this am.  - TSH normal  - As heart rate remains poorly controlled and likely has a tachycardia induced DCM will proceed with TEE/DCCV today.  She is NPO this am.   - will set up outpt sleep study  2.  HTN - BP remains controlled   3.  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   - this is likely a tachycardia mediated CM and should improve once NSR has been established.  4.  Acute on chronic combined systolic/diastolic CHF - she only put out 1L urine yesterday and is net neg 6.7L  - Creatinine has been  elevated over the past few days (0.88>>1.82>>1.7)  likely related to acute hypotension after cardizem and code with transient CPR.  Creatinine pending this am.  - continue to hold further diuretics for now until renal function improves - stat BMET this am  Signed, Traci Turner, MD  05/18/2017, 8:14 AM   

## 2017-05-18 NOTE — Transfer of Care (Signed)
Immediate Anesthesia Transfer of Care Note  Patient: Carrie Haynes  Procedure(s) Performed: Procedure(s): TRANSESOPHAGEAL ECHOCARDIOGRAM (TEE) (N/A) CARDIOVERSION (N/A)  Patient Location: Endoscopy Unit  Anesthesia Type:General  Level of Consciousness: drowsy and patient cooperative  Airway & Oxygen Therapy: Patient Spontanous Breathing and Patient connected to nasal cannula oxygen  Post-op Assessment: Report given to RN and Post -op Vital signs reviewed and stable  Post vital signs: Reviewed and stable  Last Vitals:  Vitals:   05/18/17 1132 05/18/17 1133  BP: 105/67   Pulse: (!) 104 (!) 110  Resp: (!) 26 (!) 28  Temp:      Last Pain:  Vitals:   05/18/17 1018  TempSrc: Oral  PainSc:          Complications: No apparent anesthesia complications

## 2017-05-18 NOTE — Progress Notes (Signed)
  Echocardiogram Echocardiogram Transesophageal has been performed.  Leta Jungling M 05/18/2017, 11:30 AM

## 2017-05-18 NOTE — Anesthesia Procedure Notes (Signed)
Procedure Name: General with mask airway Date/Time: 05/18/2017 11:16 AM Performed by: Julian Reil Pre-anesthesia Checklist: Patient being monitored, Timeout performed, Suction available, Emergency Drugs available and Patient identified Patient Re-evaluated:Patient Re-evaluated prior to induction Oxygen Delivery Method: Nasal cannula Preoxygenation: Pre-oxygenation with 100% oxygen Induction Type: IV induction

## 2017-05-18 NOTE — Progress Notes (Signed)
ANTICOAGULATION CONSULT NOTE - Follow Up Consult  Pharmacy Consult for Heparin to Xarelto Indication: atrial fibrillation  Allergies  Allergen Reactions  . Calcium Channel Blockers Other (See Comments)    Acute hypotension, patient became brady/asystole < 5 seconds    Patient Measurements: Height: 5' (152.4 cm) Weight: 224 lb (101.6 kg) IBW/kg (Calculated) : 45.5  Vital Signs: Temp: 97.6 F (36.4 C) (08/02 1137) Temp Source: Oral (08/02 1137) BP: 92/73 (08/02 1257) Pulse Rate: 101 (08/02 1215)  Labs:  Recent Labs  05/16/17 0502 05/16/17 2130  05/17/17 0215 05/18/17 0327 05/18/17 0915 05/18/17 1245  HGB 10.5*  --   --  10.0* 10.7*  --   --   HCT 31.5*  --   --  29.5* 33.2*  --   --   PLT 219  --   --  230 252  --   --   APTT  --  110*  < > 69* 55*  --  71*  HEPARINUNFRC  --  >2.20*  --  >2.20* 0.84*  --   --   CREATININE 1.70*  --   --   --   --  1.30*  --   < > = values in this interval not displayed.  Estimated Creatinine Clearance: 41.3 mL/min (A) (by C-G formula based on SCr of 1.3 mg/dL (H)).   Assessment: 47 yoF currently on heparin for Afib with plans to transition to Xarelto tonight. Using total body weight, CrCl is 61.8 ml/min to dose Xarelto. Will initiate first dose tonight, 8/2.    APTT is therapeutic this AM at 71- using aPTT to dose heparin for now given prior Xarelto influence on anti-Xa levels.   Patient did not convert following TEE with 4 attempts at DCCV. Plans to repeat DCCV on Tuesday.  H/H and PLT stable. No bleeding noted.   Goal of Therapy:  Heparin level 0.3-0.7 units/ml aPTT 66-102 seconds Monitor platelets by anticoagulation protocol: Yes   Plan:  -Continue heparin at 950 units/hour until initiation of Xarelto at 1700  -Initiate Xarelto 20 mg daily  -Monitor CBC and s/sx of bleeding   Diana L. Marcy Salvo, PharmD, MS PGY1 Pharmacy Resident Pager: [phone]

## 2017-05-18 NOTE — Progress Notes (Signed)
Patient did not convert following TEE with 4 attempts at DCCV.  Will change from PO Amio to IV Amio to try to get better rate control and plan repeat DCCV on Monday after Amio load over the weekend.  Will change IV Heparin gtt to Xarelto per pharmacy.

## 2017-05-19 ENCOUNTER — Encounter (HOSPITAL_COMMUNITY): Payer: Medicare Other

## 2017-05-19 DIAGNOSIS — R0601 Orthopnea: Secondary | ICD-10-CM

## 2017-05-19 DIAGNOSIS — I420 Dilated cardiomyopathy: Secondary | ICD-10-CM

## 2017-05-19 LAB — CBC
HCT: 31.3 % — ABNORMAL LOW (ref 36.0–46.0)
Hemoglobin: 10.1 g/dL — ABNORMAL LOW (ref 12.0–15.0)
MCH: 28.9 pg (ref 26.0–34.0)
MCHC: 32.3 g/dL (ref 30.0–36.0)
MCV: 89.4 fL (ref 78.0–100.0)
PLATELETS: 200 10*3/uL (ref 150–400)
RBC: 3.5 MIL/uL — ABNORMAL LOW (ref 3.87–5.11)
RDW: 14.3 % (ref 11.5–15.5)
WBC: 4.9 10*3/uL (ref 4.0–10.5)

## 2017-05-19 LAB — HEPATIC FUNCTION PANEL
ALT: 145 U/L — ABNORMAL HIGH (ref 14–54)
AST: 59 U/L — AB (ref 15–41)
Albumin: 3.1 g/dL — ABNORMAL LOW (ref 3.5–5.0)
Alkaline Phosphatase: 58 U/L (ref 38–126)
BILIRUBIN DIRECT: 0.3 mg/dL (ref 0.1–0.5)
BILIRUBIN INDIRECT: 0.9 mg/dL (ref 0.3–0.9)
BILIRUBIN TOTAL: 1.2 mg/dL (ref 0.3–1.2)
Total Protein: 6.2 g/dL — ABNORMAL LOW (ref 6.5–8.1)

## 2017-05-19 LAB — BASIC METABOLIC PANEL
Anion gap: 13 (ref 5–15)
BUN: 26 mg/dL — AB (ref 6–20)
CALCIUM: 8.8 mg/dL — AB (ref 8.9–10.3)
CO2: 21 mmol/L — AB (ref 22–32)
CREATININE: 1.34 mg/dL — AB (ref 0.44–1.00)
Chloride: 101 mmol/L (ref 101–111)
GFR calc Af Amer: 44 mL/min — ABNORMAL LOW (ref >60)
GFR calc non Af Amer: 38 mL/min — ABNORMAL LOW (ref >60)
GLUCOSE: 108 mg/dL — AB (ref 65–99)
Potassium: 4.9 mmol/L (ref 3.5–5.1)
Sodium: 135 mmol/L (ref 135–145)

## 2017-05-19 LAB — GLUCOSE, CAPILLARY
GLUCOSE-CAPILLARY: 190 mg/dL — AB (ref 65–99)
GLUCOSE-CAPILLARY: 216 mg/dL — AB (ref 65–99)
Glucose-Capillary: 128 mg/dL — ABNORMAL HIGH (ref 65–99)
Glucose-Capillary: 130 mg/dL — ABNORMAL HIGH (ref 65–99)

## 2017-05-19 MED ORDER — DOCUSATE SODIUM 100 MG PO CAPS
100.0000 mg | ORAL_CAPSULE | Freq: Two times a day (BID) | ORAL | Status: DC | PRN
  Administered 2017-05-19 – 2017-05-22 (×3): 100 mg via ORAL
  Filled 2017-05-19 (×3): qty 1

## 2017-05-19 MED ORDER — FUROSEMIDE 10 MG/ML IJ SOLN
20.0000 mg | Freq: Once | INTRAMUSCULAR | Status: AC
  Administered 2017-05-19: 20 mg via INTRAVENOUS
  Filled 2017-05-19: qty 2

## 2017-05-19 MED ORDER — FUROSEMIDE 10 MG/ML IJ SOLN
20.0000 mg | Freq: Every day | INTRAMUSCULAR | Status: DC
Start: 2017-05-19 — End: 2017-05-19
  Administered 2017-05-19: 20 mg via INTRAVENOUS
  Filled 2017-05-19: qty 2

## 2017-05-19 MED ORDER — AMIODARONE IV BOLUS ONLY 150 MG/100ML
150.0000 mg | Freq: Once | INTRAVENOUS | Status: AC
  Administered 2017-05-19: 150 mg via INTRAVENOUS

## 2017-05-19 MED ORDER — FUROSEMIDE 10 MG/ML IJ SOLN
40.0000 mg | Freq: Every day | INTRAMUSCULAR | Status: DC
  Administered 2017-05-20: 40 mg via INTRAVENOUS
  Filled 2017-05-19: qty 4

## 2017-05-19 NOTE — Progress Notes (Signed)
  Amiodarone Drug - Drug Interaction Consult Note  Recommendations:  Amiodarone is metabolized by the cytochrome P450 system and therefore has the potential to cause many drug interactions. Amiodarone has an average plasma half-life of 50 days (range 20 to 100 days).   There is potential for drug interactions to occur several weeks or months after stopping treatment and the onset of drug interactions may be slow after initiating amiodarone.   [x]  Statins: Increased risk of myopathy. Simvastatin- restrict dose to 20mg  daily. Other statins: counsel patients to report any muscle pain or weakness immediately.  []  Anticoagulants: Amiodarone can increase anticoagulant effect. Consider warfarin dose reduction. Patients should be monitored closely and the dose of anticoagulant altered accordingly, remembering that amiodarone levels take several weeks to stabilize.  []  Antiepileptics: Amiodarone can increase plasma concentration of phenytoin, the dose should be reduced. Note that small changes in phenytoin dose can result in large changes in levels. Monitor patient and counsel on signs of toxicity.  []  Beta blockers: increased risk of bradycardia, AV block and myocardial depression. Sotalol - avoid concomitant use.  []   Calcium channel blockers (diltiazem and verapamil): increased risk of bradycardia, AV block and myocardial depression.  []   Cyclosporine: Amiodarone increases levels of cyclosporine. Reduced dose of cyclosporine is recommended.  []  Digoxin dose should be halved when amiodarone is started.  [x]  Diuretics: increased risk of cardiotoxicity if hypokalemia occurs.  []  Oral hypoglycemic agents (glyburide, glipizide, glimepiride): increased risk of hypoglycemia. Patient's glucose levels should be monitored closely when initiating amiodarone therapy.   []  Drugs that prolong the QT interval:  Torsades de pointes risk may be increased with concurrent use - avoid if possible.  Monitor QTc, also  keep magnesium/potassium WNL if concurrent therapy can't be avoided. Marland Kitchen Antibiotics: e.g. fluoroquinolones, erythromycin. . Antiarrhythmics: e.g. quinidine, procainamide, disopyramide, sotalol. . Antipsychotics: e.g. phenothiazines, haloperidol.  . Lithium, tricyclic antidepressants, and methadone. Thank You,  Pasty Spillers  05/19/2017 10:42 AM

## 2017-05-19 NOTE — Progress Notes (Addendum)
Progress Note  Patient Name: Carrie Haynes Date of Encounter: 05/19/2017  Primary Cardiologist: Dr. Tenny Craw  Subjective   Patient is sitting up in the chair. She is very talkative as well as her daughter who is present. The patient is having occasional mild shortness of breath and had to get up from sleep this morning to sit in the chair to be more comfortable. She is not sure if she is actually short of breath or just very anxious as she is a bit nervous after her experience with her heart stopping. She is having no chest pain. She is complaining of nonproductive cough. She does have light red marks at the site of her cardioversion pads from yesterday.  Inpatient Medications    Scheduled Meds: . atorvastatin  20 mg Oral QPM  . insulin aspart  0-15 Units Subcutaneous TID WC  . rivaroxaban  20 mg Oral Q supper   Continuous Infusions: . sodium chloride 0  (05/15/17 0300)  . amiodarone 30 mg/hr (05/19/17 0311)   PRN Meds: acetaminophen, guaiFENesin-dextromethorphan, ondansetron (ZOFRAN) IV, oxyCODONE-acetaminophen   Vital Signs    Vitals:   05/18/17 2008 05/19/17 0044 05/19/17 0553 05/19/17 0757  BP: (!) 114/58 132/66 (!) 113/58 124/62  Pulse: 81 (!) 102 (!) 103 97  Resp: 20  20 19   Temp: (!) 97.5 F (36.4 C) 97.6 F (36.4 C) 98.2 F (36.8 C) 98 F (36.7 C)  TempSrc: Oral Oral Oral Oral  SpO2: 99% 99% 94% 97%  Weight:   225 lb 6.4 oz (102.2 kg)   Height:        Intake/Output Summary (Last 24 hours) at 05/19/17 0828 Last data filed at 05/19/17 0500  Gross per 24 hour  Intake          1405.07 ml  Output             1600 ml  Net          -194.93 ml   Filed Weights   05/18/17 0332 05/18/17 1021 05/19/17 0553  Weight: 224 lb (101.6 kg) 224 lb (101.6 kg) 225 lb 6.4 oz (102.2 kg)    Telemetry   Atrial fibrillation with Rates in the 80s and 90s - Personally Reviewed  ECG   Atrial fibrillation at 96 bpm, QTC 389- Personally Reviewed  Physical Exam   GEN: No  acute distress.   Neck: No JVD Cardiac: irregularly irregular, no murmurs, rubs, or gallops.  Respiratory: Clear to auscultation bilaterally Except for crackles in the posterior bases. GI: Soft, nontender, non-distended  MS:  trace pretibial edema; No deformity. Neuro:  Nonfocal  Psych: Normal affect   Labs    Chemistry  Recent Labs Lab 05/15/17 0237 05/16/17 0502 05/17/17 0855 05/18/17 0915  NA 132* 135  --  140  K 4.8 3.9  --  4.4  CL 98* 101  --  106  CO2 24 24  --  25  GLUCOSE 113* 112*  --  118*  BUN 36* 40*  --  24*  CREATININE 1.82* 1.70*  --  1.30*  CALCIUM 8.4* 8.5*  --  9.1  PROT  --   --  6.0*  --   ALBUMIN  --   --  2.9*  --   AST  --   --  144*  --   ALT  --   --  253*  --   ALKPHOS  --   --  65  --   BILITOT  --   --  1.1  --   GFRNONAA 26* 29*  --  40*  GFRAA 31* 33*  --  46*  ANIONGAP 10 10  --  9     Hematology  Recent Labs Lab 05/17/17 0215 05/18/17 0327 05/19/17 0221  WBC 5.4 5.8 4.9  RBC 3.34* 3.69* 3.50*  HGB 10.0* 10.7* 10.1*  HCT 29.5* 33.2* 31.3*  MCV 88.3 90.0 89.4  MCH 29.9 29.0 28.9  MCHC 33.9 32.2 32.3  RDW 14.0 14.2 14.3  PLT 230 252 200    Cardiac EnzymesNo results for input(s): TROPONINI in the last 168 hours. No results for input(s): TROPIPOC in the last 168 hours.   BNPNo results for input(s): BNP, PROBNP in the last 168 hours.   DDimer No results for input(s): DDIMER in the last 168 hours.   Radiology    No results found.  Cardiac Studies   TEE/DCC:  05/18/17 Moderate biatrial enlargement No LAA thrombus Moderate to severe posterior directed MR Severe TR EF 45% diffuse hypokinesis Mild RVE No significant aortic debris No ASD/PFO Mild AR  DCC x 4 200J patient failed to convert  Patient will need further AAT and valve disease addressed as well as sleep study Discussed with Dr Mayford Knife Continue heparin   Charlton Haws _____________________________________________________________  2D echo  05/11/2017 Study Conclusions  - Left ventricle: The cavity size was normal. Wall thickness was normal. Systolic function was mildly reduced. The estimated ejection fraction was in the range of 45% to 50%. Mild diffuse hypokinesis. Although no diagnostic regional wall motion abnormality was identified, this possibility cannot be completely excluded on the basis of this study. - Ventricular septum: The contour showed diastolic flattening. - Mitral valve: There was at least moderate regurgitation directed eccentrically and posteriorly. - Left atrium: The atrium was mildly dilated. - Right ventricle: The cavity size was mildly dilated. - Right atrium: The atrium was moderately dilated. - Tricuspid valve: There was moderate-severe regurgitation directed centrally. A diagnosis of severe regurgitation is supported by hepatic vein systolic flow reversal. - Pulmonary arteries: Systolic pressure was mildly increased. PA peak pressure: 39 mm Hg (S).  Recommendations: Consider transesophageal echocardiography if clinically indicated in order to better evaluate the severity and mechanism of mitral insufficiency. The severity of mitral insufficiency could be underestimated due to the eccentric nature of the jet and the technical limitations of the images. ____________________________________________________________  Cardiac Cath 05/12/2017 Conclusion    Right dominant coronary anatomy.  Normal coronary arteries.  Estimated left ventricular ejection fraction 35-40%, global hypokinesis, with elevated filling pressures consistent with acute on chronic combined systolic and diastolic heart failure.   RECOMMENDATIONS:   Consideration of tachycardia related left ventricular systolic dysfunction should be given.  Rate control and possible rhythm control of atrial fibrillation per primary team.     Patient Profile     76 y.o. female with PMH of NIDDM, HTN, and HL  who presented with chest pain and dyspnea And found to be in atrial fibrillation with RVR.  Assessment & Plan    1.  New onset Atrial fibrillation with RVR of unknown duration - TSH normal - HR remains poorly controlled in the 120's this am despite increasing amio to 400mg  BID. - CANNOT USE CCB as she had asystole with Cardizem. - Patient did not convert following TEE with 4 attempts at DCCV yesterday - Amiodarone changed to IV to get better rate control and plan for DCCV on Monday after amiodarone load over the weekend. The patient was made nothing by  mouth on Sunday night and orders for cardioversion have been placed - Heparin changed to Xarelto per pharmacy - Heart rate is better controlled on IV amiodarone with rates in the 80s and 90s - will set up outpt sleep study  2.  HTN - BP remains controlled   3.  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   - Echocardiogram done yesterday showed mildly dilated LV with mild concentric hypertrophy, EF 45%, mild AR, moderate to severe MR - this is likely a tachycardia mediated CM and should improve once NSR has been established.  4.  Acute on chronic combined systolic/diastolic CHF - she put out 1.6L urine yesterday and is net neg 6.9L  - Creatinine was elevated but is trending down (0.88>>1.82>>1.7>>1.3)  likely related to acute hypotension after cardizem and code with transient CPR.  Creatinine pending this am.  - Patient has occasional mild shortness of breath, trace pretibial edema, basilar rales. We'll resume low-dose of diuretic. - Continue to follow renal function  Signed, Berton Bon, NP  05/19/2017, 8:28 AM

## 2017-05-20 ENCOUNTER — Encounter (HOSPITAL_COMMUNITY): Payer: Self-pay | Admitting: Cardiovascular Disease

## 2017-05-20 LAB — BASIC METABOLIC PANEL
Anion gap: 11 (ref 5–15)
BUN: 25 mg/dL — AB (ref 6–20)
CALCIUM: 9.1 mg/dL (ref 8.9–10.3)
CO2: 24 mmol/L (ref 22–32)
CREATININE: 1.37 mg/dL — AB (ref 0.44–1.00)
Chloride: 101 mmol/L (ref 101–111)
GFR calc non Af Amer: 37 mL/min — ABNORMAL LOW (ref >60)
GFR, EST AFRICAN AMERICAN: 43 mL/min — AB (ref >60)
Glucose, Bld: 123 mg/dL — ABNORMAL HIGH (ref 65–99)
Potassium: 3.9 mmol/L (ref 3.5–5.1)
SODIUM: 136 mmol/L (ref 135–145)

## 2017-05-20 LAB — GLUCOSE, CAPILLARY
GLUCOSE-CAPILLARY: 125 mg/dL — AB (ref 65–99)
GLUCOSE-CAPILLARY: 161 mg/dL — AB (ref 65–99)
Glucose-Capillary: 202 mg/dL — ABNORMAL HIGH (ref 65–99)
Glucose-Capillary: 116 mg/dL — ABNORMAL HIGH (ref 65–99)

## 2017-05-20 NOTE — Progress Notes (Signed)
Progress Note  Patient Name: Carrie Haynes Date of Encounter: 05/20/2017  Primary Cardiologist: [facility] Tenny Craw)  Subjective   Dyspnea has improved, but still becomes short of breath walking to the bathroom. Decent ventricular rate control at rest (85-95), but prompt tachycardia with minimal exertion to the 120s. No significant pauses are seen. Minimal discomfort at defibrillator pad site.  Inpatient Medications    Scheduled Meds: . atorvastatin  20 mg Oral QPM  . furosemide  40 mg Intravenous Daily  . insulin aspart  0-15 Units Subcutaneous TID WC  . rivaroxaban  20 mg Oral Q supper   Continuous Infusions: . sodium chloride 0  (05/15/17 0300)  . amiodarone 30 mg/hr (05/20/17 1011)   PRN Meds: acetaminophen, docusate sodium, guaiFENesin-dextromethorphan, ondansetron (ZOFRAN) IV, oxyCODONE-acetaminophen   Vital Signs    Vitals:   05/19/17 2008 05/20/17 0001 05/20/17 0405 05/20/17 0741  BP: 118/66 101/61 123/75 115/64  Pulse: 96 87 96 82  Resp: 18 18 19 20   Temp: 98 F (36.7 C) 97.9 F (36.6 C) 98.1 F (36.7 C) 98.2 F (36.8 C)  TempSrc: Oral Oral Oral Oral  SpO2: 96% 97% 95% 93%  Weight:   221 lb 14.4 oz (100.7 kg)   Height:        Intake/Output Summary (Last 24 hours) at 05/20/17 1102 Last data filed at 05/20/17 0406  Gross per 24 hour  Intake              240 ml  Output             4000 ml  Net            -3760 ml   Filed Weights   05/18/17 1021 05/19/17 0553 05/20/17 0405  Weight: 224 lb (101.6 kg) 225 lb 6.4 oz (102.2 kg) 221 lb 14.4 oz (100.7 kg)    Telemetry    Atrial fibrillation - Personally Reviewed  ECG    Atrial fibrillation with rapid ventricular response - Personally Reviewed  Physical Exam  Comfortable at rest, a little tachypneic simply after standing up. Morbidly obese. GEN: No acute distress.   Neck: No JVD Cardiac:  irregular, no murmurs, rubs, or gallops.  Respiratory: Clear to auscultation bilaterally. GI: Soft, nontender,  non-distended  MS: No edema; No deformity. Neuro:  Nonfocal  Psych: Normal affect   Labs    Chemistry Recent Labs Lab 05/17/17 0855 05/18/17 0915 05/19/17 0927 05/19/17 1112 05/20/17 0355  NA  --  140 135  --  136  K  --  4.4 4.9  --  3.9  CL  --  106 101  --  101  CO2  --  25 21*  --  24  GLUCOSE  --  118* 108*  --  123*  BUN  --  24* 26*  --  25*  CREATININE  --  1.30* 1.34*  --  1.37*  CALCIUM  --  9.1 8.8*  --  9.1  PROT 6.0*  --   --  6.2*  --   ALBUMIN 2.9*  --   --  3.1*  --   AST 144*  --   --  59*  --   ALT 253*  --   --  145*  --   ALKPHOS 65  --   --  58  --   BILITOT 1.1  --   --  1.2  --   GFRNONAA  --  40* 38*  --  37*  GFRAA  --  46* 44*  --  43*  ANIONGAP  --  9 13  --  11     Hematology Recent Labs Lab 05/17/17 0215 05/18/17 0327 05/19/17 0221  WBC 5.4 5.8 4.9  RBC 3.34* 3.69* 3.50*  HGB 10.0* 10.7* 10.1*  HCT 29.5* 33.2* 31.3*  MCV 88.3 90.0 89.4  MCH 29.9 29.0 28.9  MCHC 33.9 32.2 32.3  RDW 14.0 14.2 14.3  PLT 230 252 200    Cardiac EnzymesNo results for input(s): TROPONINI in the last 168 hours. No results for input(s): TROPIPOC in the last 168 hours.   BNPNo results for input(s): BNP, PROBNP in the last 168 hours.   DDimer No results for input(s): DDIMER in the last 168 hours.   Radiology    No results found.  Cardiac Studies   TEE/DCC:  05/18/17 Moderate biatrial enlargement No LAA thrombus Moderate to severe posterior directed MR Severe TR EF 45% diffuse hypokinesis Mild RVE No significant aortic debris No ASD/PFO Mild AR  DCC x 4 200J patient failed to convert  Patient will need further AAT and valve disease addressed as well as sleep study Discussed with Dr Mayford Knife Continue heparin   Charlton Haws _____________________________________________________________  2D echo 05/11/2017 Study Conclusions  - Left ventricle: The cavity size was normal. Wall thickness was normal. Systolic function was mildly reduced.  The estimated ejection fraction was in the range of 45% to 50%. Mild diffuse hypokinesis. Although no diagnostic regional wall motion abnormality was identified, this possibility cannot be completely excluded on the basis of this study. - Ventricular septum: The contour showed diastolic flattening. - Mitral valve: There was at least moderate regurgitation directed eccentrically and posteriorly. - Left atrium: The atrium was mildly dilated. - Right ventricle: The cavity size was mildly dilated. - Right atrium: The atrium was moderately dilated. - Tricuspid valve: There was moderate-severe regurgitation directed centrally. A diagnosis of severe regurgitation is supported by hepatic vein systolic flow reversal. - Pulmonary arteries: Systolic pressure was mildly increased. PA peak pressure: 39 mm Hg (S).  Recommendations: Consider transesophageal echocardiography if clinically indicated in order to better evaluate the severity and mechanism of mitral insufficiency. The severity of mitral insufficiency could be underestimated due to the eccentric nature of the jet and the technical limitations of the images. ____________________________________________________________  Cardiac Cath 05/12/2017 Conclusion    Right dominant coronary anatomy.  Normal coronary arteries.  Estimated left ventricular ejection fraction 35-40%, global hypokinesis, with elevated filling pressures consistent with acute on chronic combined systolic and diastolic heart failure.   RECOMMENDATIONS:   Consideration of tachycardia related left ventricular systolic dysfunction should be given.  Rate control and possible rhythm control of atrial fibrillation per primary team.     Patient Profile     76 y.o. 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  Was receiving multiple rate control medications, relative transient asystole after receiving diltiazem. Failed multiple cardioversion attempts, now receiving intravenous amiodarone with plan for repeat attempt at cardioversion on Tuesday, May 23, 2017.  Assessment & Plan    1. AFib: Control remains mediocre. Continue intravenous amiodarone with plan for repeat attempt at cardioversion after roughly 72 hours on antiarrhythmic drug. No clot on T, we'll proceed directly to cardioversion on Tuesday and plan to continue lifelong DOAC. 2. HTN: Spontaneously low even without home  3. CMP: Presumed to be tachycardia related. Blood pressure does not allow a lot of heart  failure medications for conventional rate control meds. 4. CHF: Severe obesity limits the ability to assess volume status, roughly 11 L negative fluid balance since admission, but weight is down only 6 pounds since maximum weight on this admission. After an initial bump in creatinine, renal function has stabilized over last 72 hours. Continue diuresis.  Signed, Thurmon Fair, MD  05/20/2017, 11:02 AM

## 2017-05-21 LAB — BASIC METABOLIC PANEL
Anion gap: 9 (ref 5–15)
BUN: 29 mg/dL — AB (ref 6–20)
CHLORIDE: 100 mmol/L — AB (ref 101–111)
CO2: 27 mmol/L (ref 22–32)
CREATININE: 1.49 mg/dL — AB (ref 0.44–1.00)
Calcium: 9 mg/dL (ref 8.9–10.3)
GFR calc non Af Amer: 34 mL/min — ABNORMAL LOW (ref >60)
GFR, EST AFRICAN AMERICAN: 39 mL/min — AB (ref >60)
Glucose, Bld: 117 mg/dL — ABNORMAL HIGH (ref 65–99)
Potassium: 3.9 mmol/L (ref 3.5–5.1)
SODIUM: 136 mmol/L (ref 135–145)

## 2017-05-21 LAB — GLUCOSE, CAPILLARY
GLUCOSE-CAPILLARY: 124 mg/dL — AB (ref 65–99)
GLUCOSE-CAPILLARY: 134 mg/dL — AB (ref 65–99)
Glucose-Capillary: 174 mg/dL — ABNORMAL HIGH (ref 65–99)
Glucose-Capillary: 198 mg/dL — ABNORMAL HIGH (ref 65–99)

## 2017-05-21 MED ORDER — AMIODARONE HCL 200 MG PO TABS
400.0000 mg | ORAL_TABLET | Freq: Two times a day (BID) | ORAL | Status: DC
  Administered 2017-05-21 – 2017-05-23 (×5): 400 mg via ORAL
  Filled 2017-05-21 (×5): qty 2

## 2017-05-21 MED ORDER — FUROSEMIDE 10 MG/ML IJ SOLN
INTRAMUSCULAR | Status: AC
  Filled 2017-05-21: qty 4

## 2017-05-21 NOTE — Progress Notes (Signed)
Progress Note  Patient Name: Carrie Haynes Date of Encounter: 05/21/2017  Primary Cardiologist: [facility] Tenny Craw)  Subjective   She feels much better than yesterday. At rest her heart rate is in the 80s and her heart rate has consistently stayed under 100 bpm even when using the restroom. Infiltrated IV left forearm where she was receiving amiodarone with mild thrombophlebitis. The mild skin burns at the site of her previous defibrillator pad sites seem to be healing well.  Inpatient Medications    Scheduled Meds: . atorvastatin  20 mg Oral QPM  . furosemide  40 mg Intravenous Daily  . insulin aspart  0-15 Units Subcutaneous TID WC  . rivaroxaban  20 mg Oral Q supper   Continuous Infusions: . sodium chloride 0  (05/15/17 0300)  . amiodarone 30 mg/hr (05/21/17 0128)   PRN Meds: acetaminophen, docusate sodium, guaiFENesin-dextromethorphan, ondansetron (ZOFRAN) IV, oxyCODONE-acetaminophen   Vital Signs    Vitals:   05/20/17 2114 05/20/17 2340 05/21/17 0453 05/21/17 0736  BP: 105/79 (!) 117/51 110/66 98/67  Pulse: 92 82 82 80  Resp: 17 18 18 18   Temp: 98.2 F (36.8 C) 98.3 F (36.8 C) 98.4 F (36.9 C) 97.9 F (36.6 C)  TempSrc: Oral Oral Oral Oral  SpO2: 96% 94% 96% 97%  Weight:   219 lb 11.2 oz (99.7 kg)   Height:        Intake/Output Summary (Last 24 hours) at 05/21/17 0940 Last data filed at 05/21/17 0901  Gross per 24 hour  Intake           1040.4 ml  Output             2720 ml  Net          -1679.6 ml   Filed Weights   05/19/17 0553 05/20/17 0405 05/21/17 0453  Weight: 225 lb 6.4 oz (102.2 kg) 221 lb 14.4 oz (100.7 kg) 219 lb 11.2 oz (99.7 kg)    Telemetry    A. fib with controlled ventricular rate - Personally Reviewed  ECG    No new recordings - Personally Reviewed  Physical Exam  Comfortable sitting up in chair GEN: No acute distress.   Neck: No JVD Cardiac:  irregular, no murmurs, rubs, or gallops.  Respiratory: Clear to auscultation  bilaterally. GI: Soft, nontender, non-distended  MS: No edema; No deformity. Neuro:  Nonfocal  Psych: Normal affect   Labs    Chemistry Recent Labs Lab 05/17/17 0855  05/19/17 0927 05/19/17 1112 05/20/17 0355 05/21/17 0325  NA  --   < > 135  --  136 136  K  --   < > 4.9  --  3.9 3.9  CL  --   < > 101  --  101 100*  CO2  --   < > 21*  --  24 27  GLUCOSE  --   < > 108*  --  123* 117*  BUN  --   < > 26*  --  25* 29*  CREATININE  --   < > 1.34*  --  1.37* 1.49*  CALCIUM  --   < > 8.8*  --  9.1 9.0  PROT 6.0*  --   --  6.2*  --   --   ALBUMIN 2.9*  --   --  3.1*  --   --   AST 144*  --   --  59*  --   --   ALT 253*  --   --  145*  --   --   ALKPHOS 65  --   --  58  --   --   BILITOT 1.1  --   --  1.2  --   --   GFRNONAA  --   < > 38*  --  37* 34*  GFRAA  --   < > 44*  --  43* 39*  ANIONGAP  --   < > 13  --  11 9  < > = values in this interval not displayed.   Hematology Recent Labs Lab 05/17/17 0215 05/18/17 0327 05/19/17 0221  WBC 5.4 5.8 4.9  RBC 3.34* 3.69* 3.50*  HGB 10.0* 10.7* 10.1*  HCT 29.5* 33.2* 31.3*  MCV 88.3 90.0 89.4  MCH 29.9 29.0 28.9  MCHC 33.9 32.2 32.3  RDW 14.0 14.2 14.3  PLT 230 252 200    Cardiac Studies   TEE/DCC: 05/18/17 Moderate biatrial enlargement No LAA thrombus Moderate to severe posterior directed MR Severe TR EF 45% diffuse hypokinesis Mild RVE No significant aortic debris No ASD/PFO Mild AR  DCC x 4 200J patient failed to convert  Patient will need further AAT and valve disease addressed as well as sleep study Discussed with Dr Mayford Knife Continue heparin   Charlton Haws _____________________________________________________________  2D echo 05/11/2017 Study Conclusions  - Left ventricle: The cavity size was normal. Wall thickness was normal. Systolic function was mildly reduced. The estimated ejection fraction was in the range of 45% to 50%. Mild diffuse hypokinesis. Although no diagnostic regional wall  motion abnormality was identified, this possibility cannot be completely excluded on the basis of this study. - Ventricular septum: The contour showed diastolic flattening. - Mitral valve: There was at least moderate regurgitation directed eccentrically and posteriorly. - Left atrium: The atrium was mildly dilated. - Right ventricle: The cavity size was mildly dilated. - Right atrium: The atrium was moderately dilated. - Tricuspid valve: There was moderate-severe regurgitation directed centrally. A diagnosis of severe regurgitation is supported by hepatic vein systolic flow reversal. - Pulmonary arteries: Systolic pressure was mildly increased. PA peak pressure: 39 mm Hg (S).  Recommendations: Consider transesophageal echocardiography if clinically indicated in order to better evaluate the severity and mechanism of mitral insufficiency. The severity of mitral insufficiency could be underestimated due to the eccentric nature of the jet and the technical limitations of the images. ____________________________________________________________  Cardiac Cath 05/12/2017 Conclusion    Right dominant coronary anatomy.  Normal coronary arteries.  Estimated left ventricular ejection fraction 35-40%, global hypokinesis, with elevated filling pressures consistent with acute on chronic combined systolic and diastolic heart failure.   RECOMMENDATIONS:   Consideration of tachycardia related left ventricular systolic dysfunction should be given.  Rate control and possible rhythm control of atrial fibrillation per primary team.     Patient Profile     76 y.o. 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 Was receiving multiple rate control medications, relative transient asystole after receiving diltiazem. Failed multiple cardioversion  attempts, now receiving intravenous amiodarone with plan for repeat attempt at cardioversion on Tuesday, May 23, 2017.  Assessment & Plan      1. AFib: Control of ventricular rate has improved. Continue intravenous amiodarone with plan for repeat attempt at cardioversion after roughly 72 hours on antiarrhythmic drug. No clot on TEE last week, we'll proceed directly to cardioversion on Tuesday and plan to continue lifelong DOAC. 2. HTN:  blood pressure is in low normal-to-normal range  3. CMP: Presumed to be tachycardia related. Blood pressure does not allow a lot of heart failure medications or conventional rate control meds. 4. CHF: Severe obesity limits the ability to assess volume status, roughly 11 L negative fluid balance since admission, but weight is down only 8 pounds since maximum weight on this admission. Creatinine is surgically up again. Will stop diuretics. 5. Superficial femoral phlebitis: Local compresses, keep arm elevated,will switch amiodarone to oral.  Signed, Thurmon Fair, MD  05/21/2017, 9:40 AM

## 2017-05-21 NOTE — Progress Notes (Signed)
ANTICOAGULATION CONSULT NOTE - Follow Up Consult  Pharmacy Consult for Xarelto Indication: atrial fibrillation  Allergies  Allergen Reactions  . Calcium Channel Blockers Other (See Comments)    Acute hypotension, patient became brady/asystole < 5 seconds    Patient Measurements: Height: 5' (152.4 cm) Weight: 219 lb 11.2 oz (99.7 kg) IBW/kg (Calculated) : 45.5  Vital Signs: Temp: 97.9 F (36.6 C) (08/05 0736) Temp Source: Oral (08/05 0736) BP: 98/67 (08/05 0736) Pulse Rate: 80 (08/05 0736)  Labs:  Recent Labs  05/18/17 1245 05/19/17 0221 05/19/17 0927 05/20/17 0355 05/21/17 0325  HGB  --  10.1*  --   --   --   HCT  --  31.3*  --   --   --   PLT  --  200  --   --   --   APTT 71*  --   --   --   --   CREATININE  --   --  1.34* 1.37* 1.49*    Estimated Creatinine Clearance: 35.7 mL/min (A) (by C-G formula based on SCr of 1.49 mg/dL (H)).   Assessment: 30 yoF currently on Xarelto for Afib. Scr has been rising, now at 1.49. Using actual body weight, CrCl is estimated at 53 ml/min, borderline for dose decrease. Last CBC 8/3, Hgb and pltc stable. No bleeding noted. Plans to repeat DCCV on Tuesday.  Goal of Therapy: Monitor platelets by anticoagulation protocol: Yes   Plan:  Continue Xarelto 20 mg daily  Monitor renal function - if Scr continues to increase, consider dose decrease to 15mg  daily Monitor CBC and s/sx of bleeding   Erin N. Zigmund Daniel, PharmD PGY1 Pharmacy Resident Pager: [phone]

## 2017-05-21 NOTE — Progress Notes (Signed)
Report received via Environmental education officer in patient's room using SBAR format, reviewed VS, some labs, PMH and patient's general condition, was informed of patient's infiltrates, will try some heat and elevation and cont. To monitor and will assume care of patient.

## 2017-05-21 NOTE — Progress Notes (Signed)
Called to check previous iv filtration sites.  Recommend cool compresses with elevation.  Right hand pink 2 inch above previous site Tender.  Left forearm with approx 4 inches of pink area above previous site, line of demarcation, marked.  Approx. 2cm x 2cm.

## 2017-05-21 NOTE — Progress Notes (Signed)
Both sites look a little better but are still tender to touch with some pink, red color. Her right hand is a lot less swollen but still very tender but her left anterior forearm has a nodule and remains pink/red and also tender to touch, will have the charge RN look at it and see what the next POC will be, will continue to monitor. I will put in a consult for IV team to look at and see what they advise and put a text page into the MD but will also continue to monitor.

## 2017-05-22 ENCOUNTER — Inpatient Hospital Stay (HOSPITAL_COMMUNITY): Payer: Medicare Other

## 2017-05-22 ENCOUNTER — Other Ambulatory Visit (HOSPITAL_COMMUNITY): Payer: Self-pay | Admitting: Respiratory Therapy

## 2017-05-22 DIAGNOSIS — I808 Phlebitis and thrombophlebitis of other sites: Secondary | ICD-10-CM

## 2017-05-22 LAB — BASIC METABOLIC PANEL
ANION GAP: 10 (ref 5–15)
BUN: 25 mg/dL — ABNORMAL HIGH (ref 6–20)
CALCIUM: 9.3 mg/dL (ref 8.9–10.3)
CO2: 27 mmol/L (ref 22–32)
CREATININE: 1.32 mg/dL — AB (ref 0.44–1.00)
Chloride: 100 mmol/L — ABNORMAL LOW (ref 101–111)
GFR, EST AFRICAN AMERICAN: 45 mL/min — AB (ref >60)
GFR, EST NON AFRICAN AMERICAN: 39 mL/min — AB (ref >60)
Glucose, Bld: 113 mg/dL — ABNORMAL HIGH (ref 65–99)
Potassium: 4.3 mmol/L (ref 3.5–5.1)
SODIUM: 137 mmol/L (ref 135–145)

## 2017-05-22 LAB — GLUCOSE, CAPILLARY
GLUCOSE-CAPILLARY: 128 mg/dL — AB (ref 65–99)
GLUCOSE-CAPILLARY: 125 mg/dL — AB (ref 65–99)
GLUCOSE-CAPILLARY: 146 mg/dL — AB (ref 65–99)
Glucose-Capillary: 115 mg/dL — ABNORMAL HIGH (ref 65–99)

## 2017-05-22 LAB — PULMONARY FUNCTION TEST
DL/VA % pred: 106 %
DL/VA: 4.52 ml/min/mmHg/L
DLCO COR % PRED: 56 %
DLCO COR: 10.61 ml/min/mmHg
DLCO UNC % PRED: 49 %
DLCO UNC: 9.35 ml/min/mmHg
FEF 25-75 Pre: 2.39 L/sec
FEF2575-%Pred-Pre: 159 %
FEV1-%PRED-PRE: 77 %
FEV1-Pre: 1.39 L
FEV1FVC-%PRED-PRE: 120 %
FEV6-%Pred-Pre: 67 %
FEV6-Pre: 1.54 L
FEV6FVC-%PRED-PRE: 105 %
FVC-%PRED-PRE: 64 %
FVC-PRE: 1.54 L
PRE FEV1/FVC RATIO: 90 %
PRE FEV6/FVC RATIO: 100 %
RV % PRED: 60 %
RV: 1.25 L
TLC % pred: 61 %
TLC: 2.73 L

## 2017-05-22 NOTE — Progress Notes (Signed)
Progress Note  Patient Name: Carrie Haynes Date of Encounter: 05/22/2017  Primary Cardiologist: Ellis Parents Tenny Craw)  Subjective   Not much change from yesterday. Dyspnea has improved since hospitalization, but not back to baseline. Ventricular rate mostly in the 80s, increased to low 100s while taking a bath.  Inpatient Medications    Scheduled Meds: . amiodarone  400 mg Oral BID  . atorvastatin  20 mg Oral QPM  . insulin aspart  0-15 Units Subcutaneous TID WC  . rivaroxaban  20 mg Oral Q supper   Continuous Infusions: . sodium chloride 0  (05/15/17 0300)   PRN Meds: acetaminophen, docusate sodium, guaiFENesin-dextromethorphan, ondansetron (ZOFRAN) IV, oxyCODONE-acetaminophen   Vital Signs    Vitals:   05/21/17 2111 05/22/17 0052 05/22/17 0613 05/22/17 1135  BP: 104/61 110/64 122/65 (!) 91/59  Pulse: 84 86 93 (!) 108  Resp: 18 18 19 20   Temp: 97.8 F (36.6 C) 98.5 F (36.9 C) 97.8 F (36.6 C) 97.9 F (36.6 C)  TempSrc: Oral Oral Oral Oral  SpO2: 96% 97% 94% 95%  Weight:   219 lb 9.6 oz (99.6 kg)   Height:        Intake/Output Summary (Last 24 hours) at 05/22/17 1220 Last data filed at 05/22/17 6962  Gross per 24 hour  Intake              842 ml  Output             2780 ml  Net            -1938 ml   Filed Weights   05/20/17 0405 05/21/17 0453 05/22/17 0613  Weight: 221 lb 14.4 oz (100.7 kg) 219 lb 11.2 oz (99.7 kg) 219 lb 9.6 oz (99.6 kg)    Telemetry    Atrial fibrillation - Personally Reviewed  ECG    No new tracing - Personally Reviewed  Physical Exam  Relaxed severely obese GEN: No acute distress.   Neck: No JVD Cardiac: RRR, no murmurs, rubs, or gallops.  Respiratory: Clear to auscultation bilaterally. GI: Soft, nontender, non-distended  MS: No edema; No deformity. Neuro:  Nonfocal  Psych: Normal affect   Labs    Chemistry Recent Labs Lab 05/17/17 0855  05/19/17 1112 05/20/17 0355 05/21/17 0325 05/22/17 0636  NA  --   < >  --  136  136 137  K  --   < >  --  3.9 3.9 4.3  CL  --   < >  --  101 100* 100*  CO2  --   < >  --  24 27 27   GLUCOSE  --   < >  --  123* 117* 113*  BUN  --   < >  --  25* 29* 25*  CREATININE  --   < >  --  1.37* 1.49* 1.32*  CALCIUM  --   < >  --  9.1 9.0 9.3  PROT 6.0*  --  6.2*  --   --   --   ALBUMIN 2.9*  --  3.1*  --   --   --   AST 144*  --  59*  --   --   --   ALT 253*  --  145*  --   --   --   ALKPHOS 65  --  58  --   --   --   BILITOT 1.1  --  1.2  --   --   --  GFRNONAA  --   < >  --  37* 34* 39*  GFRAA  --   < >  --  43* 39* 45*  ANIONGAP  --   < >  --  11 9 10   < > = values in this interval not displayed.   Hematology Recent Labs Lab 05/17/17 0215 05/18/17 0327 05/19/17 0221  WBC 5.4 5.8 4.9  RBC 3.34* 3.69* 3.50*  HGB 10.0* 10.7* 10.1*  HCT 29.5* 33.2* 31.3*  MCV 88.3 90.0 89.4  MCH 29.9 29.0 28.9  MCHC 33.9 32.2 32.3  RDW 14.0 14.2 14.3  PLT 230 252 200     Radiology    No results found.  Cardiac Studies   TEE/DCC: 05/18/17 Moderate biatrial enlargement No LAA thrombus Moderate to severe posterior directed MR Severe TR EF 45% diffuse hypokinesis Mild RVE No significant aortic debris No ASD/PFO Mild AR  DCC x 4 200J patient failed to convert  Patient will need further AAT and valve disease addressed as well as sleep study Discussed with Dr Mayford Knife Continue heparin   Charlton Haws _____________________________________________________________  2D echo 05/11/2017 Study Conclusions  - Left ventricle: The cavity size was normal. Wall thickness was normal. Systolic function was mildly reduced. The estimated ejection fraction was in the range of 45% to 50%. Mild diffuse hypokinesis. Although no diagnostic regional wall motion abnormality was identified, this possibility cannot be completely excluded on the basis of this study. - Ventricular septum: The contour showed diastolic flattening. - Mitral valve: There was at least moderate  regurgitation directed eccentrically and posteriorly. - Left atrium: The atrium was mildly dilated. - Right ventricle: The cavity size was mildly dilated. - Right atrium: The atrium was moderately dilated. - Tricuspid valve: There was moderate-severe regurgitation directed centrally. A diagnosis of severe regurgitation is supported by hepatic vein systolic flow reversal. - Pulmonary arteries: Systolic pressure was mildly increased. PA peak pressure: 39 mm Hg (S).  Recommendations: Consider transesophageal echocardiography if clinically indicated in order to better evaluate the severity and mechanism of mitral insufficiency. The severity of mitral insufficiency could be underestimated due to the eccentric nature of the jet and the technical limitations of the images. ____________________________________________________________  Cardiac Cath 05/12/2017 Conclusion    Right dominant coronary anatomy.  Normal coronary arteries.  Estimated left ventricular ejection fraction 35-40%, global hypokinesis, with elevated filling pressures consistent with acute on chronic combined systolic and diastolic heart failure.   RECOMMENDATIONS:   Consideration of tachycardia related left ventricular systolic dysfunction should be given.  Rate control and possible rhythm control of atrial fibrillation per primary team.     Patient Profile     76 y.o. 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 Was receiving multiple rate control medications, relative transient asystole after receiving diltiazem. Failed multiple cardioversion attempts, now receiving intravenous amiodarone with plan for repeat attempt at cardioversion on Tuesday, May 23, 2017.  Assessment & Plan    1. AFib: Periventricular rate control. On amiodarone with plan for repeat  attempt at cardioversion after roughly 72 hours on antiarrhythmic drug. Switch to oral amiodarone after she developed superficial thrombophlebitis No clot on TEE last week, we'll proceed directly to cardioversion on Tuesday and plan to continue lifelong DOAC. 2. 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 Blood pressure does not allow a lot of heart failure medications or conventional rate  control meds. 4. CHF: Severe obesity limits the ability to assess volume status, roughly 14 L negative fluid balance since admission, weight is down only 8 pounds since maximum weight on this admission. Creatinine is stable, after trending upwards yesterday. Diuretics stopped. No change in weight since yesterday. 5. Superficial thrombophlebitis:  looking a little better   Signed, Thurmon Fair, MD  05/22/2017, 12:20 PM

## 2017-05-23 ENCOUNTER — Inpatient Hospital Stay (HOSPITAL_COMMUNITY): Payer: Medicare Other | Admitting: Certified Registered Nurse Anesthetist

## 2017-05-23 ENCOUNTER — Encounter (HOSPITAL_COMMUNITY)
Admission: EM | Disposition: A | Discharge status: Home / Self Care | Payer: Self-pay | Source: Home / Self Care | Attending: Internal Medicine

## 2017-05-23 ENCOUNTER — Encounter (HOSPITAL_COMMUNITY): Payer: Self-pay | Admitting: Anesthesiology

## 2017-05-23 HISTORY — PX: CARDIOVERSION: SHX1299

## 2017-05-23 LAB — BASIC METABOLIC PANEL
Anion gap: 10 (ref 5–15)
BUN: 24 mg/dL — ABNORMAL HIGH (ref 6–20)
CHLORIDE: 101 mmol/L (ref 101–111)
CO2: 26 mmol/L (ref 22–32)
Calcium: 9.1 mg/dL (ref 8.9–10.3)
Creatinine, Ser: 1.27 mg/dL — ABNORMAL HIGH (ref 0.44–1.00)
GFR calc non Af Amer: 41 mL/min — ABNORMAL LOW (ref >60)
GFR, EST AFRICAN AMERICAN: 47 mL/min — AB (ref >60)
Glucose, Bld: 113 mg/dL — ABNORMAL HIGH (ref 65–99)
POTASSIUM: 4.3 mmol/L (ref 3.5–5.1)
SODIUM: 137 mmol/L (ref 135–145)

## 2017-05-23 LAB — GLUCOSE, CAPILLARY
GLUCOSE-CAPILLARY: 109 mg/dL — AB (ref 65–99)
GLUCOSE-CAPILLARY: 292 mg/dL — AB (ref 65–99)

## 2017-05-23 SURGERY — CARDIOVERSION
Anesthesia: General

## 2017-05-23 MED ORDER — LIDOCAINE 2% (20 MG/ML) 5 ML SYRINGE
INTRAMUSCULAR | Status: DC | PRN
  Administered 2017-05-23: 40 mg via INTRAVENOUS

## 2017-05-23 MED ORDER — HYDROCORTISONE 1 % EX CREA: 1.0000 "application " | TOPICAL_CREAM | Freq: Three times a day (TID) | CUTANEOUS | Status: DC | PRN

## 2017-05-23 MED ORDER — ASPIRIN 81 MG PO TABS: 81.0000 mg | ORAL_TABLET | Freq: Every day | ORAL | 3 refills | Status: DC

## 2017-05-23 MED ORDER — SODIUM CHLORIDE 0.9 % IV SOLN: 250.0000 mL | INTRAVENOUS | Status: DC

## 2017-05-23 MED ORDER — PROPOFOL 10 MG/ML IV BOLUS
INTRAVENOUS | Status: DC | PRN
  Administered 2017-05-23: 70 mg via INTRAVENOUS

## 2017-05-23 MED ORDER — SODIUM CHLORIDE 0.9% FLUSH: 3.0000 mL | INTRAVENOUS | Status: DC | PRN

## 2017-05-23 MED ORDER — AMIODARONE HCL 200 MG PO TABS: ORAL_TABLET | ORAL | 3 refills | Status: DC

## 2017-05-23 MED ORDER — RIVAROXABAN 20 MG PO TABS: 20.0000 mg | ORAL_TABLET | Freq: Every day | ORAL | 12 refills | Status: DC

## 2017-05-23 MED ORDER — SODIUM CHLORIDE 0.9% FLUSH: 3.0000 mL | Freq: Two times a day (BID) | INTRAVENOUS | Status: DC

## 2017-05-23 NOTE — Progress Notes (Signed)
Updated report received via Gwen, VSS, n=meds and new orders reviewed, assumed care of patient.

## 2017-05-23 NOTE — Transfer of Care (Signed)
Immediate Anesthesia Transfer of Care Note  Patient: Carrie Haynes  Procedure(s) Performed: Procedure(s): CARDIOVERSION (N/A)  Patient Location: Endoscopy Unit  Anesthesia Type:MAC  Level of Consciousness: drowsy and patient cooperative  Airway & Oxygen Therapy: Patient Spontanous Breathing and Patient connected to nasal cannula oxygen  Post-op Assessment: Report given to RN and Post -op Vital signs reviewed and stable  Post vital signs: Reviewed and stable  Last Vitals:  Vitals:   05/23/17 0906 05/23/17 0907  BP: 100/65   Pulse: 75 76  Resp: (!) 25 (!) 24  Temp:      Last Pain:  Vitals:   05/23/17 0818  TempSrc: Oral  PainSc:       Patients Stated Pain Goal: 0 (03/00/92 3300)  Complications: No apparent anesthesia complications

## 2017-05-23 NOTE — Interval H&P Note (Signed)
History and Physical Interval Note:  05/23/2017 8:06 AM  Carrie Haynes  has presented today for surgery, with the diagnosis of AFIB  The various methods of treatment have been discussed with the patient and family. After consideration of risks, benefits and other options for treatment, the patient has consented to  Procedure(s): CARDIOVERSION (N/A) as a surgical intervention .  The patient's history has been reviewed, patient examined, no change in status, stable for surgery.  I have reviewed the patient's chart and labs.  Questions were answered to the patient's satisfaction.     Armanda Magic

## 2017-05-23 NOTE — Progress Notes (Signed)
Progress Note  Patient Name: Carrie Haynes Date of Encounter: 05/23/2017  Primary Cardiologist: New  Subjective   Had a successful cardioversion today after a second shock at 200 J. Maintaining normal sinus rhythm and already feeling much better. Has areas of thrombophlebitis on both forearms, without skin necrosis or extreme tenderness.  Inpatient Medications    Scheduled Meds: . amiodarone  400 mg Oral BID  . atorvastatin  20 mg Oral QPM  . insulin aspart  0-15 Units Subcutaneous TID WC  . rivaroxaban  20 mg Oral Q supper  . sodium chloride flush  3 mL Intravenous Q12H   Continuous Infusions: . sodium chloride Stopped (05/23/17 0917)  . sodium chloride     PRN Meds: acetaminophen, docusate sodium, guaiFENesin-dextromethorphan, hydrocortisone cream, ondansetron (ZOFRAN) IV, oxyCODONE-acetaminophen, sodium chloride flush   Vital Signs    Vitals:   05/23/17 0907 05/23/17 0910 05/23/17 0921 05/23/17 0942  BP:  113/62 121/70 (!) 108/95  Pulse: 76 65  69  Resp: (!) 24 (!) 31 (!) 24   Temp:  98.4 F (36.9 C)  97.8 F (36.6 C)  TempSrc:  Oral    SpO2: 97% 97% 95% 100%  Weight:      Height:        Intake/Output Summary (Last 24 hours) at 05/23/17 1004 Last data filed at 05/23/17 0909  Gross per 24 hour  Intake              800 ml  Output             2350 ml  Net            -1550 ml   Filed Weights   05/22/17 0613 05/23/17 0402 05/23/17 0818  Weight: 219 lb 9.6 oz (99.6 kg) 220 lb 3.2 oz (99.9 kg) 220 lb (99.8 kg)    Telemetry    Normal sinus rhythm with rare PACs - Personally Reviewed  ECG    Pending post cardioversion - Personally Reviewed  Physical Exam  Smiling, very comfortable. Morbidly obese. Visible marks from defibrillator pads on both chest and back, without skin breakdown. Thrombophlebitis on dorsum and medial aspect of left forearm as well as a smaller area of thrombophlebitis dorsum of right hand GEN: No acute distress.   Neck: No  JVD Cardiac: RRR, no murmurs, rubs, or gallops.  Respiratory: Clear to auscultation bilaterally. GI: Soft, nontender, non-distended  MS: No edema; No deformity. Neuro:  Nonfocal  Psych: Normal affect   Labs    Chemistry Recent Labs Lab 05/17/17 0855  05/19/17 1112  05/21/17 0325 05/22/17 0636 05/23/17 0551  NA  --   < >  --   < > 136 137 137  K  --   < >  --   < > 3.9 4.3 4.3  CL  --   < >  --   < > 100* 100* 101  CO2  --   < >  --   < > 27 27 26   GLUCOSE  --   < >  --   < > 117* 113* 113*  BUN  --   < >  --   < > 29* 25* 24*  CREATININE  --   < >  --   < > 1.49* 1.32* 1.27*  CALCIUM  --   < >  --   < > 9.0 9.3 9.1  PROT 6.0*  --  6.2*  --   --   --   --  ALBUMIN 2.9*  --  3.1*  --   --   --   --   AST 144*  --  59*  --   --   --   --   ALT 253*  --  145*  --   --   --   --   ALKPHOS 65  --  58  --   --   --   --   BILITOT 1.1  --  1.2  --   --   --   --   GFRNONAA  --   < >  --   < > 34* 39* 41*  GFRAA  --   < >  --   < > 39* 45* 47*  ANIONGAP  --   < >  --   < > 9 10 10   < > = values in this interval not displayed.   Hematology Recent Labs Lab 05/17/17 0215 05/18/17 0327 05/19/17 0221  WBC 5.4 5.8 4.9  RBC 3.34* 3.69* 3.50*  HGB 10.0* 10.7* 10.1*  HCT 29.5* 33.2* 31.3*  MCV 88.3 90.0 89.4  MCH 29.9 29.0 28.9  MCHC 33.9 32.2 32.3  RDW 14.0 14.2 14.3  PLT 230 252 200    Cardiac EnzymesNo results for input(s): TROPONINI in the last 168 hours. No results for input(s): TROPIPOC in the last 168 hours.   BNPNo results for input(s): BNP, PROBNP in the last 168 hours.   DDimer No results for input(s): DDIMER in the last 168 hours.   Radiology    No results found.  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 Wall thickness was normal. Systolic function was mildly reduced. The estimated ejection fraction was in the range of 45% to 50%. Mild diffuse hypokinesis. Although no diagnostic regional wall motion abnormality was identified, this possibility cannot be completely excluded on the basis of this study. - Ventricular septum: The contour showed diastolic flattening. - Mitral valve: There was at least moderate regurgitation directed eccentrically and posteriorly. - Left atrium: The atrium was mildly dilated. - Right ventricle: The cavity size was mildly dilated. - Right atrium: The atrium was moderately dilated. - Tricuspid valve: There was moderate-severe regurgitation directed centrally. A diagnosis of severe regurgitation is supported by hepatic vein systolic flow reversal. - Pulmonary arteries: Systolic pressure was mildly increased. PA peak pressure: 39 mm Hg (S).  Recommendations: Consider transesophageal echocardiography if clinically indicated in order to better evaluate the severity and mechanism of mitral insufficiency. The severity of mitral insufficiency could be underestimated due to the eccentric nature of the jet and the technical limitations of the images. ____________________________________________________________  Cardiac Cath 05/12/2017 Conclusion    Right dominant coronary anatomy.  Normal coronary arteries.  Estimated left ventricular ejection fraction 35-40%, global hypokinesis, with elevated filling pressures consistent with acute on chronic combined systolic and diastolic heart failure.   RECOMMENDATIONS:   Consideration of tachycardia related left ventricular systolic  dysfunction should be given.  Rate control and possible rhythm control of atrial fibrillation per primary team.  Patient Profile     76 y.o. 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 Failed initial attempt at cardioversion, successful synchronized cardioversion after 72 hours on amiodarone.  Assessment & Plan    1. AFib: In normal sinus rhythm after cardioversion, on amiodarone. Plan to continue amiodarone for at least the next 3 months or so. 400 mg twice daily for a week, 200 mg twice daily for 2 weeks, then 200 mg daily. Emphasized critical importance of uninterrupted anticoagulation for the next 30 days. Plan to continue lifelong DOAC, but beyond 30 days will be okay to briefly interrupted for surgical procedures or bleeding complications. Clinic follow-up in 1-2 weeks. Has had baseline PFTs. Advised her that she will need twice yearly liver and thyroid function tests while on amiodarone, avoid sun exposure, yearly eye exam. 2. 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 I don't think she will need diuretics or commercial heart failure meds. Anticipate recovery of left ventricle systolic function over the next several weeks 4. CHF: Severe obesity limits the ability to assess volume status, roughly 14 L negative fluid balance since admission, weight is down only 8pounds since maximum weight on this admission. Creatinine continuing to improve. Diuretics stopped. Weight has been essentially unchanged from last 5 days, without diuretics 5. Superficial thrombophlebitis: looking a little better. Coumadin 1% hydrocortisone cream for the chest wall, back and arm areas of inflammation.   Signed, Thurmon Fair, MD  05/23/2017, 10:04 AM

## 2017-05-23 NOTE — Anesthesia Postprocedure Evaluation (Signed)
Anesthesia Post Note  Patient: Carrie Haynes  Procedure(s) Performed: Procedure(s) (LRB): CARDIOVERSION (N/A)     Patient location during evaluation: PACU Anesthesia Type: General Level of consciousness: awake and alert and oriented Pain management: pain level controlled Vital Signs Assessment: post-procedure vital signs reviewed and stable Respiratory status: spontaneous breathing, nonlabored ventilation and respiratory function stable Cardiovascular status: blood pressure returned to baseline and stable Postop Assessment: no signs of nausea or vomiting Anesthetic complications: no    Last Vitals:  Vitals:   05/23/17 0910 05/23/17 0921  BP: 113/62 121/70  Pulse: 65   Resp: (!) 31 (!) 24  Temp: 36.9 C     Last Pain:  Vitals:   05/23/17 0910  TempSrc: Oral  PainSc:                  Dotti Busey A.

## 2017-05-23 NOTE — CV Procedure (Signed)
   Electrical Cardioversion Procedure Note EUSEBIA FREELON [id number] 12/13/1940  Procedure: Electrical Cardioversion Indications:  Atrial Fibrillation  Time Out: Verified patient identification, verified procedure,medications/allergies/relevent history reviewed, required imaging and test results available.  Performed  Procedure Details  The patient was NPO after midnight. Anesthesia was administered at the beside  by Dr.Foster with 70mg  of propofol and Lidocaine 40mg .  Cardioversion was done with synchronized biphasic defibrillation with AP pads with 150watts.  The patient did not  convert to normal sinus rhythm. Cardioversion was done with synchronized biphasic defibrillation with AP pads with 200watts.  The patient converted to normal sinus rhythm.The patient tolerated the procedure well   IMPRESSION:  Successful cardioversion of atrial fibrillation    Traci Turner 05/23/2017, 8:07 AM

## 2017-05-23 NOTE — Progress Notes (Signed)
CM Discharge Planning: Contacted pt's pharmacy and they do have in stock. Pt has Xarelto 30 day free trial card. Isidoro Donning RN CCM Case Mgmt phone [phone]

## 2017-05-23 NOTE — Anesthesia Preprocedure Evaluation (Signed)
Anesthesia Evaluation  Patient identified by MRN, date of birth, ID band Patient awake    Reviewed: Allergy & Precautions, NPO status , Patient's Chart, lab work & pertinent test results  Airway Mallampati: II  TM Distance: >3 FB Neck ROM: Full    Dental  (+) Partial Upper, Poor Dentition, Caps   Pulmonary shortness of breath and with exertion,    Pulmonary exam normal breath sounds clear to auscultation       Cardiovascular hypertension, Pt. 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% diffuse hypokinesis   Neuro/Psych negative neurological ROS  negative psych ROS   GI/Hepatic negative GI ROS, Neg liver ROS,   Endo/Other  diabetes, Well Controlled, Type 2, Oral Hypoglycemic AgentsMorbid obesityHyyperlipidemia  Renal/GU negative Renal ROS  negative genitourinary   Musculoskeletal negative musculoskeletal ROS (+)   Abdominal (+) + obese,   Peds  Hematology On Xarelto- last dose yesterday   Anesthesia Other Findings   Reproductive/Obstetrics                             Anesthesia Physical Anesthesia Plan  ASA: III  Anesthesia Plan: General   Post-op Pain Management:    Induction: Intravenous  PONV Risk Score and Plan: 3 and Propofol infusion and Ondansetron  Airway Management Planned: Mask  Additional Equipment:   Intra-op Plan:   Post-operative Plan:   Informed Consent: I have reviewed the patients History and Physical, chart, labs and discussed the procedure including the risks, benefits and alternatives for the proposed anesthesia with the patient or authorized representative who has indicated his/her understanding and acceptance.   Dental advisory given  Plan Discussed with: CRNA, Anesthesiologist and Surgeon  Anesthesia Plan Comments:         Anesthesia Quick Evaluation

## 2017-05-23 NOTE — Discharge Summary (Signed)
Discharge Summary    Patient ID: Carrie Haynes,  MRN: [id number], DOB/AGE: Oct 23, 1940 76 y.o.  Admit date: 05/10/2017 Discharge date: 05/23/2017   Primary Care Provider: Laurann Montana Primary Cardiologist: Dr. Royann Shivers (per patient)  Discharge Diagnoses    Principal Problem:   Atrial fibrillation [facility]) Active Problems:   Dyspnea   Acute on chronic combined systolic and diastolic CHF (congestive heart failure) (HCC)   Asystole (HCC)   Hypotension   Atrial fibrillation with RVR (HCC)   DCM (dilated cardiomyopathy) (HCC)   Allergies Allergies  Allergen Reactions  . Calcium Channel Blockers Other (See Comments)    Acute hypotension, patient became brady/asystole < 5 seconds     History of Present Illness     Carrie Haynes is a 76 yo female with PMH of NIDDM, HTN, and HL. Reports she is followed by her PCP for chronic issues. Family hx of CAD with half sister having an MI.  Reports she has felt short of breath over the past 6 months, but recently worsened. States 2 nights ago she developed worsening dyspnea, and orthopnea.Complains of SSCP Felt like a stabbing sensation in her chest with deep inspiration  Also in between shoulder blades  Again, pleuritic.  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 Was on coumadin afterwards, and then taken off. States she had another PE afterwards, and on coumadin again. This was many years ago per her report. Pt has also had some episodes of dizziness over the past couple of weeks, mostly with walking.  Flet like she had to hold herself up with wall   Also reports some palpitations prior to admission.   Presented to the ED with worsening dyspnea. She was started on IV heparin given concern for possible PE. CTA was negative for PE but showed evidence to support pulmonary hypertension.   Labs showed stable electrolytes, BNP 115, Hgb 10.6, I stat Trop 0.01, Mag 1.5. CXR negative for  edema. EKG showed new onset Afib RVR. Placed on IV dilt and rate improved from 140s to 100s. Given 2g IV mag in the ED.    Hospital Course     Consultants: None  She underwent right and left heart catheterization on 05/12/17 with normal coronaries and with elevated filling pressures consistent with acute on chronic combined systolic and diastolic heart failure. Left ventricular systolic dysfunction was thought to be secondary to tachycardia. She was placed on PO amiodarone and underwent TEE/DCCV on 05/18/17. The cardioversion was unsuccessful. She was continued on anticoagulation (xarelto) and started on IV amiodarone with plans for repeat DCCV. Unfortunately, IV amiodarone infiltrated with bilateral thrombophlebitis in both arms, no skin necrosis. Second DCCV without TEE was performed on 05/23/17 with conversion to sinus rhythm. EKG confirms sinus rhythm.   Will continue xarelto and PO amiodarone.   Borderline BP precludes initiation of conventional heart failure medications. Will repeat echo in three months after established NSR to evaluate function. Reduced LV function may be related to Afib and tachycardia.  Patient seen and examined by Dr. Royann Shivers today and was stable for discharge. All follow up has been arranged.  _____________  Discharge Vitals Blood pressure (!) 108/95, pulse 69, temperature 97.8 F (36.6 C), resp. rate (!) 24, height 5' (1.524 m), weight 220 lb (99.8 kg), SpO2 100 %.  Filed Weights   05/22/17 0613 05/23/17 0402 05/23/17 0818  Weight: 219 lb 9.6 oz (99.6 kg) 220 lb  3.2 oz (99.9 kg) 220 lb (99.8 kg)    Labs & Radiologic Studies    CBC No results for input(s): WBC, NEUTROABS, HGB, HCT, MCV, PLT in the last 72 hours. Basic Metabolic Panel  Recent Labs  05/22/17 0636 05/23/17 0551  NA 137 137  K 4.3 4.3  CL 100* 101  CO2 27 26  GLUCOSE 113* 113*  BUN 25* 24*  CREATININE 1.32* 1.27*  CALCIUM 9.3 9.1   Liver Function Tests No results for input(s): AST,  ALT, ALKPHOS, BILITOT, PROT, ALBUMIN in the last 72 hours. No results for input(s): LIPASE, AMYLASE in the last 72 hours. Cardiac Enzymes No results for input(s): CKTOTAL, CKMB, CKMBINDEX, TROPONINI in the last 72 hours. BNP Invalid input(s): POCBNP D-Dimer No results for input(s): DDIMER in the last 72 hours. Hemoglobin A1C No results for input(s): HGBA1C in the last 72 hours. Fasting Lipid Panel No results for input(s): CHOL, HDL, LDLCALC, TRIG, CHOLHDL, LDLDIRECT in the last 72 hours. Thyroid Function Tests No results for input(s): TSH, T4TOTAL, T3FREE, THYROIDAB in the last 72 hours.  Invalid input(s): FREET3 _____________  Ct Angio Chest Pe W And/or Wo Contrast  Result Date: 05/10/2017 CLINICAL DATA:  Chest pain short of breath. History of DVT and pulmonary embolism EXAM: CT ANGIOGRAPHY CHEST WITH CONTRAST TECHNIQUE: Multidetector CT imaging of the chest was performed using the standard protocol during bolus administration of intravenous contrast. Multiplanar CT image reconstructions and MIPs were obtained to evaluate the vascular anatomy. CONTRAST:  100 mL Isovue 370 IV COMPARISON:  CT chest 03/03/2009 FINDINGS: Cardiovascular: Pulmonary arteries are enlarged suggesting pulmonary artery hypertension. Enlargement of the right atrium and right ventricle. Negative for pulmonary embolism. Atherosclerotic calcification in the aortic arch on left coronary artery. Negative for aortic aneurysm. Mediastinum/Nodes: Negative for mass or adenopathy. Lungs/Pleura: Hypoventilation with mild atelectasis in the lung bases. Negative for infiltrate effusion or lung mass Upper Abdomen: Negative Musculoskeletal: Negative Review of the MIP images confirms the above findings. IMPRESSION: Negative for pulmonary embolism Pulmonary artery hypertension Mild atelectasis in the lung bases. Electronically Signed   By: Marlan Palau M.D.   On: 05/10/2017 14:00   Dg Chest Portable 1 View  Result Date:  05/10/2017 CLINICAL DATA:  Chest pain and shortness of breath for 2 days EXAM: PORTABLE CHEST 1 VIEW COMPARISON:  10/25/2006 FINDINGS: Cardiac shadow is enlarged in size. Aortic calcifications are noted. The lungs are well aerated bilaterally. No focal infiltrate or sizable effusion is seen. No bony abnormality is noted. IMPRESSION: No active disease. Electronically Signed   By: Alcide Clever M.D.   On: 05/10/2017 13:42     Diagnostic Studies/Procedures    DCCV 05/23/17: successful conversion to sinus rhythm   DCCV 05/18/17: unsuccessful - returned to floor and placed on IV amiodarone   Right and left heart catheterization 05/12/17:  Right dominant coronary anatomy.  Normal coronary arteries.  Estimated left ventricular ejection fraction 35-40%, global hypokinesis, with elevated filling pressures consistent with acute on chronic combined systolic and diastolic heart failure.  RECOMMENDATIONS:  Consideration of tachycardia related left ventricular systolic dysfunction should be given.  Rate control and possible rhythm control of atrial fibrillation per primary team.   Echocardiogram 05/11/17: Study Conclusions - Left ventricle: The cavity size was normal. Wall thickness was   normal. Systolic function was mildly reduced. The estimated   ejection fraction was in the range of 45% to 50%. Mild diffuse   hypokinesis. Although no diagnostic regional wall motion   abnormality was identified,  this possibility cannot be completely   excluded on the basis of this study. - Ventricular septum: The contour showed diastolic flattening. - Mitral valve: There was at least moderate regurgitation directed   eccentrically and posteriorly. - Left atrium: The atrium was mildly dilated. - Right ventricle: The cavity size was mildly dilated. - Right atrium: The atrium was moderately dilated. - Tricuspid valve: There was moderate-severe regurgitation directed   centrally. A diagnosis of severe  regurgitation is supported by   hepatic vein systolic flow reversal. - Pulmonary arteries: Systolic pressure was mildly increased. PA   peak pressure: 39 mm Hg (S).  Recommendations:  Consider transesophageal echocardiography if clinically indicated in order to better evaluate the severity and mechanism of mitral insufficiency. The severity of mitral insufficiency could be underestimated due to the eccentric nature of the jet and the technical limitations of the images.   Disposition   Pt is being discharged home today in good condition.  Follow-up Plans & Appointments    Follow-up Information    Azalee Course, [place] Follow up on 05/31/2017.   Specialties:  Cardiology, Radiology Why:  10AM for TCM and hospital follow up Contact information: [address] [place] [address] [phone]          Discharge Instructions    Diet - low sodium heart healthy    Complete by:  As directed    Increase activity slowly    Complete by:  As directed       Discharge Medications   Current Discharge Medication List    START taking these medications   Details  amiodarone (PACERONE) 200 MG tablet Take 400 mg (2 tablets) twice daily for 7 days. Then decrease to 200 mg (1 tablet) twice daily for 14 days. Then decrease to 200 mg (1 tablet) daily thereafter. Qty: 66 tablet, Refills: 3    rivaroxaban (XARELTO) 20 MG TABS tablet Take 1 tablet (20 mg total) by mouth daily with supper. Qty: 30 tablet, Refills: 12      CONTINUE these medications which have CHANGED   Details  aspirin 81 MG tablet Take 1 tablet (81 mg total) by mouth daily. Qty: 30 tablet, Refills: 3      CONTINUE these medications which have NOT CHANGED   Details  atorvastatin (LIPITOR) 20 MG tablet Take 20 mg by mouth every evening. Refills: 3    Cholecalciferol (VITAMIN D-3) 5000 units TABS Take 5,000 Units by mouth daily.    losartan (COZAAR) 25 MG tablet Take 25 mg by mouth at bedtime. Refills: 1     metFORMIN (GLUCOPHAGE-XR) 500 MG 24 hr tablet Take 2,000 mg by mouth at bedtime.          Outstanding Labs/Studies   Follow up in 2 weeks, EKG for QTc, TSH Echo in three months Twice yearly thyroid and liver function tests while on amiodarone Lifelong DOAC, may be interrupted after 30 days of treatment s/p DCCV  Duration of Discharge Encounter   Greater than 30 minutes including physician time.  Signed, Roe Rutherford Ellery Meroney PA-C 05/23/2017, 10:48 AM

## 2017-05-23 NOTE — Care Management Important Message (Signed)
Important Message  Patient Details  Name: Carrie Haynes MRN: [id number] Date of Birth: August 06, 1941   Medicare Important Message Given:  Yes    Kyla Balzarine 05/23/2017, 9:46 AM

## 2017-05-23 NOTE — Anesthesia Procedure Notes (Signed)
Procedure Name: MAC Date/Time: 05/23/2017 9:00 AM Performed by: Mervyn Gay Pre-anesthesia Checklist: Patient identified, Patient being monitored, Timeout performed, Emergency Drugs available and Suction available Patient Re-evaluated:Patient Re-evaluated prior to induction Oxygen Delivery Method: Simple face mask and Nasal cannula Number of attempts: 1 Placement Confirmation: positive ETCO2 Dental Injury: Teeth and Oropharynx as per pre-operative assessment

## 2017-05-25 ENCOUNTER — Telehealth: Payer: Self-pay | Admitting: Cardiology

## 2017-05-25 DIAGNOSIS — R942 Abnormal results of pulmonary function studies: Secondary | ICD-10-CM

## 2017-05-25 NOTE — Telephone Encounter (Signed)
Informed patient of results and verbal understanding expressed.   Pulmonary referral placed for scheduling. Patient agrees with treatment plan. 

## 2017-05-25 NOTE — Telephone Encounter (Signed)
-----   Message from Quintella Reichert, MD sent at 05/23/2017  8:46 AM EDT ----- PFTs are abnormal - please set patient up for outpt Pulmonary consult as DLCO is down and patient has been started on Amio.

## 2017-05-25 NOTE — Telephone Encounter (Signed)
New message  Patient calling stating that Dr. Mayford Knife call her today - returning call back

## 2017-05-31 ENCOUNTER — Ambulatory Visit (INDEPENDENT_AMBULATORY_CARE_PROVIDER_SITE_OTHER): Payer: Medicare Other | Admitting: Physician Assistant

## 2017-05-31 ENCOUNTER — Encounter: Payer: Self-pay | Admitting: Physician Assistant

## 2017-05-31 VITALS — BP 123/74 | HR 84 | Ht 60.0 in | Wt 215.6 lb

## 2017-05-31 DIAGNOSIS — E785 Hyperlipidemia, unspecified: Secondary | ICD-10-CM

## 2017-05-31 DIAGNOSIS — I10 Essential (primary) hypertension: Secondary | ICD-10-CM | POA: Diagnosis not present

## 2017-05-31 DIAGNOSIS — Z79899 Other long term (current) drug therapy: Secondary | ICD-10-CM

## 2017-05-31 DIAGNOSIS — I428 Other cardiomyopathies: Secondary | ICD-10-CM

## 2017-05-31 DIAGNOSIS — I4891 Unspecified atrial fibrillation: Secondary | ICD-10-CM

## 2017-05-31 DIAGNOSIS — E119 Type 2 diabetes mellitus without complications: Secondary | ICD-10-CM

## 2017-05-31 DIAGNOSIS — R942 Abnormal results of pulmonary function studies: Secondary | ICD-10-CM

## 2017-05-31 NOTE — Progress Notes (Signed)
Cardiology Office Note    Date:  06/01/2017   ID:  Carrie Haynes, DOB [birth year], MRN [id number]  PCP:  Laurann Montana, MD  Cardiologist:  Dr. Royann Shivers (per patient)  Chief Complaint  Patient presents with  . Hospitalization Follow-up    SOB, pt had Cardioversion done    History of Present Illness:  Carrie Haynes is a 76 y.o. female with PMH of HTN, HLD, h/o DVT/PE, NIDDM and newly diagnosed atrial fibrillation. She recently presented to [facility] on 05/10/2017 complaining of worsening dyspnea for the past 6 months. She also complained of substernal chest discomfort with deep inspiration. CT was negative for PE. Initial BNP was 115. Chest x-ray was negative for edema. EKG showed new onset of atrial flutter ablation with RVR. Echocardiogram obtained on 05/11/2017 showed EF 45-50%, mild diffuse hypokinesis, moderate mitral regurgitation directed eccentrically and posteriorly, mildly dilated left atrium, moderately dilated right atrium, moderate to severe TR, PA peak pressure 39 mmHg. 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 Post cath, IV heparin was switched to Xarelto.  Due to difficulty rate control the patient with beta blocker, on 05/14/2017, she was switched to diltiazem. She became very symptomatic with slow A. fib with heart rate of 40 to 50s. Systolic blood pressure also dropped down to 50 to 60s. She was placed in Trendelenburg position with IVF, however remained hypotensive. She was started on Norepi, then she briefly went unconscious with asystole less than 5 seconds. CPR was about to be started but patient recovered. She was given half amp of epi at the bedside, norepinephrine was turned to 30. Eventually pressure was slowly titrated down. She did develop acute kidney injury afterward secondary to hypotension. She underwent TEE/DCCV on  05/18/2017, the procedure failed after 4 shocks at 200 J. Her PO amiodarone was switched to IV amiodarone to get better rate control. After a weekend of IV amiodarone loading, she underwent successful DC cardioversion on 05/23/2017. She was eventually discharged on the following day.  She has been doing well since she left the hospital. She denies any chest pain. She still continued to have occasional shortness breath especially with exertion. Her recent PFT showed restrictive disease, she is being referred to pulmonology service for further workup especially now she is on amiodarone. She is currently not on any rate control medication. She is on losartan for her LV dysfunction. She will need a basic metabolic panel tomorrow when she see her primary care provider to check for renal function and electrolytes. I'm hesitant to add carvedilol at this point given recent severe bradycardia and hypotension and cardiac arrest.    Past Medical History:  Diagnosis Date  . Diabetes mellitus without complication (HCC)   . Hyperlipemia   . Hypertension     Past Surgical History:  Procedure Laterality Date  . CARDIOVERSION N/A 05/18/2017   Procedure: CARDIOVERSION;  Surgeon: Wendall Stade, MD;  Location: [facility] ENDOSCOPY;  Service: Cardiovascular;  Laterality: N/A;  . CARDIOVERSION N/A 05/23/2017   Procedure: CARDIOVERSION;  Surgeon: Quintella Reichert, MD;  Location: [facility] ENDOSCOPY;  Service: Cardiovascular;  Laterality: N/A;  . RIGHT/LEFT HEART CATH AND CORONARY ANGIOGRAPHY N/A 05/12/2017   Procedure: Right/Left Heart Cath and Coronary Angiography;  Surgeon: Lyn Records, MD;  Location: [facility] INVASIVE CV LAB;  Service: Cardiovascular;  Laterality: N/A;  . TEE WITHOUT CARDIOVERSION N/A 05/18/2017   Procedure: TRANSESOPHAGEAL ECHOCARDIOGRAM (TEE);  Surgeon: Wendall Stade, MD;  Location: [facility] ENDOSCOPY;  Service: Cardiovascular;  Laterality: N/A;    Current Medications: Outpatient Medications Prior to Visit  Medication Sig  Dispense Refill  . amiodarone (PACERONE) 200 MG tablet Take 400 mg (2 tablets) twice daily for 7 days. Then decrease to 200 mg (1 tablet) twice daily for 14 days. Then decrease to 200 mg (1 tablet) daily thereafter. 66 tablet 3  . atorvastatin (LIPITOR) 20 MG tablet Take 20 mg by mouth every evening.  3  . Cholecalciferol (VITAMIN D-3) 5000 units TABS Take 5,000 Units by mouth daily.    Marland Kitchen losartan (COZAAR) 25 MG tablet Take 25 mg by mouth at bedtime.  1  . metFORMIN (GLUCOPHAGE-XR) 500 MG 24 hr tablet Take 2,000 mg by mouth at bedtime.    . rivaroxaban (XARELTO) 20 MG TABS tablet Take 1 tablet (20 mg total) by mouth daily with supper. 30 tablet 12  . aspirin 81 MG tablet Take 1 tablet (81 mg total) by mouth daily. 30 tablet 3   No facility-administered medications prior to visit.      Allergies:   Calcium channel blockers   Social History   Social History  . Marital status: Widowed    Spouse name: N/A  . Number of children: N/A  . Years of education: N/A   Social History Main Topics  . Smoking status: Never Smoker  . Smokeless tobacco: Never Used  . Alcohol use No  . Drug use: No  . Sexual activity: Not Asked   Other Topics Concern  . None   Social History Narrative  . None     Family History:  The patient's family history includes Heart attack in her sister; Hypertension in her father.   ROS:   Please see the history of present illness.    ROS All other systems reviewed and are negative.   PHYSICAL EXAM:   VS:  BP 123/74   Pulse 84   Ht 5' (1.524 m)   Wt 215 lb 9.6 oz (97.8 kg)   BMI 42.11 kg/m    GEN: Well nourished, well developed, in no acute distress  HEENT: normal  Neck: no JVD, carotid bruits, or masses Cardiac: RRR; no murmurs, rubs, or gallops,no edema  Respiratory:  clear to auscultation bilaterally, normal work of breathing GI: soft, nontender, nondistended, + BS MS: no deformity or atrophy  Skin: warm and dry, no rash Neuro:  Alert and Oriented x  3, Strength and sensation are intact Psych: euthymic mood, full affect  Wt Readings from Last 3 Encounters:  05/31/17 215 lb 9.6 oz (97.8 kg)  05/23/17 220 lb (99.8 kg)      Studies/Labs Reviewed:   EKG:  EKG is ordered today.  The ekg ordered today demonstrates Normal sinus rhythm poor progression in the anterior lead  Recent Labs: 05/10/2017: B Natriuretic Peptide 115.2; TSH 1.703 05/11/2017: Magnesium 1.9 05/19/2017: ALT 145; Hemoglobin 10.1; Platelets 200 05/23/2017: BUN 24; Creatinine, Ser 1.27; Potassium 4.3; Sodium 137   Lipid Panel    Component Value Date/Time   CHOL 118 05/13/2017 0331   TRIG 48 05/13/2017 0331   HDL 46 05/13/2017 0331   CHOLHDL 2.6 05/13/2017 0331   VLDL 10 05/13/2017 0331   LDLCALC 62 05/13/2017 0331    Additional studies/ records that were reviewed today include:   Echo 05/11/2017 LV EF: 45% -   50%  Study Conclusions  - Left ventricle: The cavity size was normal. Wall thickness was  normal. Systolic function was mildly reduced. The estimated   ejection fraction was in the range of 45% to 50%. Mild diffuse   hypokinesis. Although no diagnostic regional wall motion   abnormality was identified, this possibility cannot be completely   excluded on the basis of this study. - Ventricular septum: The contour showed diastolic flattening. - Mitral valve: There was at least moderate regurgitation directed   eccentrically and posteriorly. - Left atrium: The atrium was mildly dilated. - Right ventricle: The cavity size was mildly dilated. - Right atrium: The atrium was moderately dilated. - Tricuspid valve: There was moderate-severe regurgitation directed   centrally. A diagnosis of severe regurgitation is supported by   hepatic vein systolic flow reversal. - Pulmonary arteries: Systolic pressure was mildly increased. PA   peak pressure: 39 mm Hg (S).   Cath 05/12/2017 Conclusion    Right dominant coronary anatomy.  Normal coronary  arteries.  Estimated left ventricular ejection fraction 35-40%, global hypokinesis, with elevated filling pressures consistent with acute on chronic combined systolic and diastolic heart failure.   RECOMMENDATIONS:   Consideration of tachycardia related left ventricular systolic dysfunction should be given.  Rate control and possible rhythm control of atrial fibrillation per primary team.      ASSESSMENT:    1. Atrial fibrillation, unspecified type (HCC)   2. Encounter for long-term (current) use of medications   3. NICM (nonischemic cardiomyopathy) (HCC)   4. Essential hypertension   5. Hyperlipidemia, unspecified hyperlipidemia type   6. Controlled type 2 diabetes mellitus without complication, without long-term current use of insulin (HCC)   7. Abnormal PFT      PLAN:  In order of problems listed above:  1. Paroxysmal atrial fibrillation: Maintaining sinus rhythm after recently had to cardioversion. Currently loaded on amiodarone. She is currently on day 2 of 200 mg twice a day dose of amiodarone. After 2 weeks, she will decrease amiodarone to 200 mg daily. Her recent PFT was abnormal, she is being referred to pulmonology service. Continue Xarelto. Stop aspirin given negative cath recently.  2. NICM: Recent cardiac catheterization did not show coronary artery disease, her decreased ejection fraction is likely tachycardia mediated cardiomyopathy. She is currently tolerating losartan. I'm hesitant to add a beta blocker as this time. Note, patient had an episode of asystole in the hospital after her beta blocker was switched to diltiazem.  3. Hypertension: Blood pressure stable, continue on losartan, will need a basic metabolic panel tomorrow at PCPs office, result will be faxed to [place].  4. Hyperlipidemia: Continue Lipitor 20 mg daily  5. DM II: On metformin    Medication Adjustments/Labs and Tests Ordered: Current medicines are reviewed at length with the patient today.   Concerns regarding medicines are outlined above.  Medication changes, Labs and Tests ordered today are listed in the Patient Instructions below. Patient Instructions  Medication Instructions:   STOP Aspirin  Labwork:   BMET tomorrow at primary care office (non-fasting test). Please have them fax results to [place] at [phone]  Testing/Procedures:  none  Follow-Up:  With Dr. Royann Shivers in 2-3 months   If you need a refill on your cardiac medications before your next appointment, please call your pharmacy.      Ramond Dial, [place]  06/01/2017 11:25 PM    [facility] [address] [place], [place], [place]  [address] Phone: [phone]; Fax: [phone]

## 2017-05-31 NOTE — Patient Instructions (Signed)
Medication Instructions:   STOP Aspirin  Labwork:   BMET tomorrow at primary care office (non-fasting test). Please have them fax results to [place] at [phone]  Testing/Procedures:  none  Follow-Up:  With Dr. Royann Shivers in 2-3 months   If you need a refill on your cardiac medications before your next appointment, please call your pharmacy.

## 2017-06-01 ENCOUNTER — Encounter: Payer: Self-pay | Admitting: Physician Assistant

## 2017-06-01 DIAGNOSIS — I480 Paroxysmal atrial fibrillation: Secondary | ICD-10-CM | POA: Diagnosis not present

## 2017-06-01 DIAGNOSIS — I129 Hypertensive chronic kidney disease with stage 1 through stage 4 chronic kidney disease, or unspecified chronic kidney disease: Secondary | ICD-10-CM | POA: Diagnosis not present

## 2017-06-01 DIAGNOSIS — I420 Dilated cardiomyopathy: Secondary | ICD-10-CM | POA: Diagnosis not present

## 2017-06-01 DIAGNOSIS — E1121 Type 2 diabetes mellitus with diabetic nephropathy: Secondary | ICD-10-CM | POA: Diagnosis not present

## 2017-07-12 ENCOUNTER — Ambulatory Visit (INDEPENDENT_AMBULATORY_CARE_PROVIDER_SITE_OTHER): Payer: Medicare Other | Admitting: Internal Medicine

## 2017-07-12 ENCOUNTER — Ambulatory Visit (INDEPENDENT_AMBULATORY_CARE_PROVIDER_SITE_OTHER)
Admission: RE | Admit: 2017-07-12 | Discharge: 2017-07-12 | Disposition: A | Discharge status: Home / Self Care | Payer: Medicare Other | Source: Ambulatory Visit | Attending: Internal Medicine | Admitting: Internal Medicine

## 2017-07-12 ENCOUNTER — Encounter: Payer: Self-pay | Admitting: Internal Medicine

## 2017-07-12 ENCOUNTER — Other Ambulatory Visit (INDEPENDENT_AMBULATORY_CARE_PROVIDER_SITE_OTHER): Payer: Medicare Other

## 2017-07-12 VITALS — BP 116/68 | HR 83 | Ht 61.0 in | Wt 213.6 lb

## 2017-07-12 DIAGNOSIS — R0600 Dyspnea, unspecified: Secondary | ICD-10-CM

## 2017-07-12 DIAGNOSIS — R0609 Other forms of dyspnea: Secondary | ICD-10-CM

## 2017-07-12 DIAGNOSIS — R05 Cough: Secondary | ICD-10-CM

## 2017-07-12 DIAGNOSIS — R058 Other specified cough: Secondary | ICD-10-CM

## 2017-07-12 DIAGNOSIS — R0602 Shortness of breath: Secondary | ICD-10-CM | POA: Diagnosis not present

## 2017-07-12 LAB — CBC WITH DIFFERENTIAL/PLATELET
BASOS ABS: 0 10*3/uL (ref 0.0–0.1)
Basophils Relative: 0.7 % (ref 0.0–3.0)
Eosinophils Absolute: 0.1 10*3/uL (ref 0.0–0.7)
Eosinophils Relative: 1 % (ref 0.0–5.0)
HCT: 39.2 % (ref 36.0–46.0)
HEMOGLOBIN: 12.9 g/dL (ref 12.0–15.0)
LYMPHS PCT: 28.5 % (ref 12.0–46.0)
Lymphs Abs: 1.8 10*3/uL (ref 0.7–4.0)
MCHC: 32.9 g/dL (ref 30.0–36.0)
MCV: 91.2 fl (ref 78.0–100.0)
MONOS PCT: 8.2 % (ref 3.0–12.0)
Monocytes Absolute: 0.5 10*3/uL (ref 0.1–1.0)
Neutro Abs: 3.8 10*3/uL (ref 1.4–7.7)
Neutrophils Relative %: 61.6 % (ref 43.0–77.0)
Platelets: 277 10*3/uL (ref 150.0–400.0)
RBC: 4.31 Mil/uL (ref 3.87–5.11)
RDW: 17.3 % — AB (ref 11.5–15.5)
WBC: 6.2 10*3/uL (ref 4.0–10.5)

## 2017-07-12 LAB — BASIC METABOLIC PANEL
BUN: 21 mg/dL (ref 6–23)
CO2: 25 mEq/L (ref 19–32)
Calcium: 9.8 mg/dL (ref 8.4–10.5)
Chloride: 100 mEq/L (ref 96–112)
Creatinine, Ser: 0.86 mg/dL (ref 0.40–1.20)
GFR: 68.57 mL/min (ref >60.00)
Glucose, Bld: 115 mg/dL — ABNORMAL HIGH (ref 70–99)
Potassium: 4.9 mEq/L (ref 3.5–5.1)
Sodium: 135 mEq/L (ref 135–145)

## 2017-07-12 LAB — BRAIN NATRIURETIC PEPTIDE: PRO B NATRI PEPTIDE: 34 pg/mL (ref 0.0–100.0)

## 2017-07-12 LAB — TSH: TSH: 2.93 u[IU]/mL (ref 0.35–4.50)

## 2017-07-12 LAB — SEDIMENTATION RATE: Sed Rate: 43 mm/hr — ABNORMAL HIGH (ref 0–30)

## 2017-07-12 NOTE — Progress Notes (Signed)
Subjective:     Patient ID: Carrie Haynes, female   DOB: 01/29/1941,     MRN: [id number]  HPI  45 yowf never smoker with remote dx of AB but did not stay on inhalers and breathing was fine x ? Years/? Decades but since early 2018 noted doe > admitted  Admit date: 05/10/2017 Discharge date: 05/23/2017   Primary Care Provider: Laurann Montana Primary Cardiologist: Dr. Royann Shivers (per patient)  Discharge Diagnoses    Principal Problem:   Atrial fibrillation [facility]) Active Problems:   Dyspnea   Acute on chronic combined systolic and diastolic CHF (congestive heart failure) (HCC)   Asystole (HCC)   Hypotension   Atrial fibrillation with RVR (HCC)   DCM (dilated cardiomyopathy) (HCC)   Allergies      Allergies  Allergen Reactions  . Calcium Channel Blockers Other (See Comments)    Acute hypotension, patient became brady/asystole < 5 seconds     History of Present Illness     Carrie Haynes is a 76 yo female with PMH of NIDDM, HTN, and HL. Reports she is followed by her PCP for chronic issues. Family hx of CAD with half sister having an MI.  Reports she has felt short of breath over the past 6 months, but recently worsened. States 2 nights ago she developed worsening dyspnea, and orthopnea.Complains of SSCPFelt like a stabbing sensation in her chest with deep inspiration Also in between shoulder blades Again, pleuritic.  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 Was on coumadin afterwards, and then taken off. States she had another PE afterwards, and on coumadin again. This was many years ago per her report. Pt has also had some episodes of dizziness over the past couple of weeks, mostly with walking. Flet like she had to hold herself up with wall Also reports some palpitations prior to admission.   Presented to the ED with worsening dyspnea. She was started on IV heparin given concern for possible PE. CTA was  negative for PE but showed evidence to support pulmonary hypertension. Labs showed stable electrolytes, BNP 115, Hgb 10.6, I stat Trop 0.01, Mag 1.5. CXR negative for edema. EKG showed new onset Afib RVR. Placed on IV dilt and rate improved from 140s to 100s. Given 2g IV mag in the ED.    Hospital Course     Consultants: None  She underwent right and left heart catheterization on 05/12/17 with normal coronaries and with elevated filling pressures consistent with acute on chronic combined systolic and diastolic heart failure. Left ventricular systolic dysfunction was thought to be secondary to tachycardia. She was placed on PO amiodarone and underwent TEE/DCCV on 05/18/17. The cardioversion was unsuccessful. She was continued on anticoagulation (xarelto) and started on IV amiodarone with plans for repeat DCCV. Unfortunately, IV amiodarone infiltrated with bilateral thrombophlebitis in both arms, no skin necrosis. Second DCCV without TEE was performed on 05/23/17 with conversion to sinus rhythm. EKG confirms sinus rhythm.   Will continue xarelto and PO amiodarone.   Borderline BP precludes initiation of conventional heart failure medications. Will repeat echo in three months after established NSR to evaluate function. Reduced LV function may be related to Afib and tachycardia.  Patient seen and examined by Dr. Royann Shivers today and was stable for discharge. All follow up has been arranged.    07/12/2017 1st [facility] office visit/ Keyshun Elpers   Chief Complaint  Patient presents with  . Advice  Only    Referred by Dr. Royann Shivers. Pt had an abnormal PFT 05/19/17. Pt admitted to hospital 7/25-8/7 due to atrial fibrillation and was unable to breathe. Pt currently complains of occ. cough and SOB on exertion.  doe x MMRC2 = can't walk a nl pace on a flat grade s sob but does fine slow and flat eg shopping / leaning on cart  When last had nl activity tol wt =  250 and despite wt loss has lost ground with  doe over same time frame = about 8 months and much better since admit but not back to prev baseline Also cough x years sporadic and dry and notes at hs and in am > rest of the day  but no worse on amio Sleeps ok on one pillow    No obvious day to day or daytime variability or assoc excess/ purulent sputum or mucus plugs or hemoptysis or cp or chest tightness, subjective wheeze or overt sinus or hb symptoms. No unusual exp hx or h/o childhood pna/ asthma or knowledge of premature birth.   . Also denies any obvious fluctuation of symptoms with weather or environmental changes or other aggravating or alleviating factors except as outlined above   Current Allergies, Complete Past Medical History, Past Surgical History, Family History, and Social History were reviewed in [facility] record.  ROS  The following are not active complaints unless bolded sore throat, dysphagia, dental problems, itching, sneezing,  nasal congestion or disharge of excess mucus or purulent secretions, ear ache,   fever, chills, sweats, unintended wt loss or wt gain, classically pleuritic or exertional cp,  orthopnea pnd or leg swelling, presyncope, palpitations, abdominal pain, anorexia, nausea, vomiting, diarrhea  or change in bowel habits or bladder habits, change in stools or change in urine, dysuria, hematuria,  rash, arthralgias, visual complaints, headache, numbness, weakness or ataxia or problems with walking or coordination,  change in mood/affect or memory.        Current Meds  Medication Sig  . amiodarone (PACERONE) 200 MG tablet Take 400 mg (2 tablets) twice daily for 7 days. Then decrease to 200 mg (1 tablet) twice daily for 14 days. Then decrease to 200 mg (1 tablet) daily thereafter.  Marland Kitchen atorvastatin (LIPITOR) 20 MG tablet Take 20 mg by mouth every evening.  . Cholecalciferol (VITAMIN D-3) 5000 units TABS Take 5,000 Units by mouth daily.  Marland Kitchen losartan (COZAAR) 25 MG tablet Take 25 mg by mouth  at bedtime.  . metFORMIN (GLUCOPHAGE-XR) 500 MG 24 hr tablet Take 2,000 mg by mouth at bedtime.  . rivaroxaban (XARELTO) 20 MG TABS tablet Take 1 tablet (20 mg total) by mouth daily with supper.                 Review of Systems     Objective:   Physical Exam amb obese wf nad  Wt Readings from Last 3 Encounters:  07/12/17 213 lb 9.6 oz (96.9 kg)  05/31/17 215 lb 9.6 oz (97.8 kg)  05/23/17 220 lb (99.8 kg)    Vital signs reviewed  - Note on arrival 02 sats  97% on RA     HEENT: nl dentition, turbinates bilaterally, and oropharynx. Nl external ear canals without cough reflex   NECK :  without JVD/Nodes/TM/ nl carotid upstrokes bilaterally   LUNGS: no acc muscle use,  Nl contour chest which is clear to A and P bilaterally without cough on insp or exp maneuvers   CV:  RRR  no s3 or murmur or increase in P2, and no edema   ABD:  massively obese but soft and nontender with limited inspiratory excursion in the supine position. No bruits or organomegaly appreciated, bowel sounds nl  MS:  Nl gait/ ext warm without deformities, calf tenderness, cyanosis or clubbing No obvious joint restrictions   SKIN: warm and dry without lesions    NEURO:  alert, approp, nl sensorium with  no motor or cerebellar deficits apparent.       I personally reviewed images and agree with radiology impression as follows:  Chest CTa 05/10/17  Negative for pulmonary embolism Pulmonary artery hypertension Mild atelectasis in the lung bases.    CXR PA and Lateral:   07/12/2017 :    I personally reviewed images and agree with radiology impression as follows:    cardiomegaly/ no def ild   Labs ordered/ reviewed:      Chemistry      Component Value Date/Time   NA 135 07/12/2017 1543   K 4.9 07/12/2017 1543   CL 100 07/12/2017 1543   CO2 25 07/12/2017 1543   BUN 21 07/12/2017 1543   CREATININE 0.86 07/12/2017 1543      Component Value Date/Time   CALCIUM 9.8 07/12/2017 1543    ALKPHOS 58 05/19/2017 1112   AST 59 (H) 05/19/2017 1112   ALT 145 (H) 05/19/2017 1112   BILITOT 1.2 05/19/2017 1112        Lab Results  Component Value Date   WBC 6.2 07/12/2017   HGB 12.9 07/12/2017   HCT 39.2 07/12/2017   MCV 91.2 07/12/2017   PLT 277.0 07/12/2017       EOS                       0.1                                                                    07/12/2017     Lab Results  Component Value Date   TSH 2.93 07/12/2017     Lab Results  Component Value Date   PROBNP 34.0 07/12/2017       Lab Results  Component Value Date   ESRSEDRATE 43 (H) 07/12/2017            Assessment:

## 2017-07-12 NOTE — Patient Instructions (Signed)
Add pepcid 20 mg one hour before bed  GERD (REFLUX)  is an extremely common cause of respiratory symptoms just like yours , many times with no obvious heartburn at all.    It can be treated with medication, but also with lifestyle changes including elevation of the head of your bed (ideally with 6 inch  bed blocks),  Smoking cessation, avoidance of late meals, excessive alcohol, and avoid fatty foods, chocolate, peppermint, colas, red wine, and acidic juices such as orange juice.  NO MINT OR MENTHOL PRODUCTS SO NO COUGH DROPS   USE SUGARLESS CANDY INSTEAD (Jolley ranchers or Stover's or Life Savers) or even ice chips will also do - the key is to swallow to prevent all throat clearing. NO OIL BASED VITAMINS - use powdered substitutes.   Monitor your oxygen level with regular paced walking and let me and Dr C know right away if trending down over several days or weeks (one or two days is not a true "trend" so keep monitoring as regularly as you can)  Please remember to go to the lab and x-ray department downstairs in the basement  for your tests - we will call you with the results when they are available.     Please schedule a follow up visit in 3 months but call sooner if needed with pfts

## 2017-07-13 DIAGNOSIS — E66812 Obesity, class 2: Secondary | ICD-10-CM | POA: Insufficient documentation

## 2017-07-13 DIAGNOSIS — R058 Other specified cough: Secondary | ICD-10-CM | POA: Insufficient documentation

## 2017-07-13 DIAGNOSIS — R05 Cough: Secondary | ICD-10-CM | POA: Insufficient documentation

## 2017-07-13 LAB — RESPIRATORY ALLERGY PROFILE REGION II ~~LOC~~
ALLERGEN, CEDAR TREE, T6: 1.87 kU/L — AB
ALLERGEN, D PTERNOYSSINUS, D1: 0.71 kU/L — AB
Allergen, A. alternata, m6: <0.1 kU/L
Allergen, Comm Silver Birch, t9: <0.1 kU/L
Allergen, Cottonwood, t14: <0.1 kU/L
Bermuda Grass: <0.1 kU/L
Box Elder IgE: <0.1 kU/L
CAT DANDER: 13.7 kU/L — AB
CLASS: 0
CLASS: 0
CLASS: 0
CLASS: 0
CLASS: 0
CLASS: 0
CLASS: 0
CLASS: 1
CLASS: 1
CLASS: 2
COCKROACH: 0.68 kU/L — AB
COMMON RAGWEED (SHORT) (W1) IGE: <0.1 kU/L
Class: 0
Class: 0
Class: 0
Class: 0
Class: 0
Class: 0
Class: 0
Class: 0
Class: 0
Class: 0
Class: 0
Class: 1
Class: 2
Class: 3
D. FARINAE: 0.61 kU/L — AB
Dog Dander: 0.53 kU/L — ABNORMAL HIGH
Elm IgE: <0.1 kU/L
IgE (Immunoglobulin E), Serum: 436 kU/L — ABNORMAL HIGH (ref <=114)
Johnson Grass: <0.1 kU/L
Pecan/Hickory Tree IgE: <0.1 kU/L

## 2017-07-13 LAB — INTERPRETATION:

## 2017-07-13 NOTE — Assessment & Plan Note (Signed)
-  PFT's  05/22/2017  FVC 1.54 (64%) no obst s rx prior to study with DLCO  49/56 % corrects to 106  % for alv volume   - Amiodarone rx 05/18/17 >>> - 07/12/2017  Walked RA x 3 laps @ 185 ft each stopped due to  End of study, fast pace, desat to 88 but held steady there and only mild sob  - ESR 07/12/2017  = 46    pfts are most c/w effects of obesity with poor baseline so there is an added risk for amio pulmonary toxicity as she has less reserve than most pts.  Patients typically have been on amiodarone for 6-12 months before this complication manifests.  Of note, serial clinical evaluation for symptoms such as cough dyspnea or fevers is  the preferred method of monitoring for pulmonary toxicity because a decrease in DLCO or lung volumes is a nonspecific for toxicity. Pathologically amiodarone pulmonary toxicity may appear as interstitial pneumonitis, eosinophilic pneumonia, organizing pneumonia, pulmonary fibrosis or less commonly as diffuse alveolar hemorrhage, pulmonary nodules or pleural effusions.  Risk factors for pulmonary toxicity include age greater than 41, daily dose greater than equal to 400 mg, a high cumulative dose, or pre-existing lung disease  - since obesity is not really a "lung dz" she only has 1.5/4 risk factors and reasonable to have her monitor her own sats at a steady pace of walking and see if there is any decline and if so then will need to stop the drug until she checks with me / Dr C    Total time devoted to counseling  > 50 % of initial 60 min office visit:  review case with pt/ discussion of options/alternatives/ personally creating written customized instructions  in presence of pt  then going over those specific  Instructions directly with the pt including how to use all of the meds but in particular covering each new medication in detail and the difference between the maintenance= "automatic" meds and the prns using an action plan format for the latter (If this problem/symptom =>  do that organization reading Left to right).  Please see AVS from this visit for a full list of these instructions which I personally wrote for this pt and  are unique to this visit.

## 2017-07-13 NOTE — Assessment & Plan Note (Addendum)
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   These are the same pts (now being labeled as having "irritable larynx syndrome" by some cough centers) who not infrequently have a history of having failed to tolerate ace inhibitors,  dry powder inhalers or biphosphonates or report having atypical/extraesophageal reflux symptoms that don't respond to standard doses of PPI  and are easily confused as having aecopd or asthma flares by even experienced allergists/ pulmonologists (myself included).   rec gerd diet/ trial of h2 at hs and f/u in 3 m as this is a very chronic problem that does not require more expedited w/u and dx depends more on response to treatment than additional studies at this time.

## 2017-07-13 NOTE — Assessment & Plan Note (Signed)
Body mass index is 40.36 kg/m.  -  trending down/ encouraged Lab Results  Component Value Date   TSH 2.93 07/12/2017     Complicated by dm/hbp/ hyperlidemia and contributing to gerd risk/ doe/reviewed the need and the process to achieve and maintain neg calorie balance > defer f/u primary care including intermittently monitoring thyroid status

## 2017-07-14 NOTE — Progress Notes (Signed)
Spoke with pt and notified of results per Dr. Wert. Pt verbalized understanding and denied any questions. 

## 2017-07-17 NOTE — Progress Notes (Signed)
Spoke with pt and notified of results per Dr. Wert. Pt verbalized understanding and denied any questions. 

## 2017-07-28 ENCOUNTER — Encounter: Payer: Self-pay | Admitting: Cardiovascular Disease

## 2017-07-28 DIAGNOSIS — H35372 Puckering of macula, left eye: Secondary | ICD-10-CM | POA: Diagnosis not present

## 2017-07-28 DIAGNOSIS — Z961 Presence of intraocular lens: Secondary | ICD-10-CM | POA: Diagnosis not present

## 2017-07-28 DIAGNOSIS — H2589 Other age-related cataract: Secondary | ICD-10-CM | POA: Diagnosis not present

## 2017-07-28 DIAGNOSIS — H40013 Open angle with borderline findings, low risk, bilateral: Secondary | ICD-10-CM | POA: Diagnosis not present

## 2017-07-28 LAB — HM DIABETES EYE EXAM

## 2017-08-03 DIAGNOSIS — N182 Chronic kidney disease, stage 2 (mild): Secondary | ICD-10-CM | POA: Diagnosis not present

## 2017-08-03 DIAGNOSIS — I129 Hypertensive chronic kidney disease with stage 1 through stage 4 chronic kidney disease, or unspecified chronic kidney disease: Secondary | ICD-10-CM | POA: Diagnosis not present

## 2017-08-03 DIAGNOSIS — E1121 Type 2 diabetes mellitus with diabetic nephropathy: Secondary | ICD-10-CM | POA: Diagnosis not present

## 2017-08-03 DIAGNOSIS — Z23 Encounter for immunization: Secondary | ICD-10-CM | POA: Diagnosis not present

## 2017-08-24 DIAGNOSIS — Z1231 Encounter for screening mammogram for malignant neoplasm of breast: Secondary | ICD-10-CM | POA: Diagnosis not present

## 2017-08-24 DIAGNOSIS — Z01419 Encounter for gynecological examination (general) (routine) without abnormal findings: Secondary | ICD-10-CM | POA: Diagnosis not present

## 2017-08-24 DIAGNOSIS — Z6841 Body Mass Index (BMI) 40.0 and over, adult: Secondary | ICD-10-CM | POA: Diagnosis not present

## 2017-08-31 ENCOUNTER — Ambulatory Visit: Payer: Medicare Other | Admitting: Cardiovascular Disease

## 2017-08-31 ENCOUNTER — Encounter: Payer: Self-pay | Admitting: Cardiovascular Disease

## 2017-08-31 VITALS — BP 140/75 | HR 97 | Ht 60.0 in | Wt 221.0 lb

## 2017-08-31 DIAGNOSIS — I2781 Cor pulmonale (chronic): Secondary | ICD-10-CM | POA: Diagnosis not present

## 2017-08-31 DIAGNOSIS — I481 Persistent atrial fibrillation: Secondary | ICD-10-CM

## 2017-08-31 DIAGNOSIS — I420 Dilated cardiomyopathy: Secondary | ICD-10-CM

## 2017-08-31 DIAGNOSIS — I4819 Other persistent atrial fibrillation: Secondary | ICD-10-CM

## 2017-08-31 DIAGNOSIS — I10 Essential (primary) hypertension: Secondary | ICD-10-CM | POA: Diagnosis not present

## 2017-08-31 MED ORDER — METOPROLOL SUCCINATE ER 25 MG PO TB24: 25.0000 mg | ORAL_TABLET | Freq: Every day | ORAL | 3 refills | Status: DC

## 2017-08-31 NOTE — Patient Instructions (Addendum)
Medication Instructions: Dr Royann Shivers has recommended making the following medication changes: 1. STOP Amiodarone 2. START Metoprolol Succinate 25 mg - take 1 tablet by mouth daily  Labwork: NONE ORDERED  Testing/Procedures: 1. Limited Echocardiogram - Your physician has requested that you have an echocardiogram. Echocardiography is a painless test that uses sound waves to create images of your heart. It provides your doctor with information about the size and shape of your heart and how well your heart's chambers and valves are working. This procedure takes approximately one hour. There are no restrictions for this procedure. **This will be performed at our [facility] location - [address], [address].  Follow-up: Dr Royann Shivers recommends that you schedule a follow-up appointment in 2-3 months.  If you need a refill on your cardiac medications before your next appointment, please call your pharmacy.   Kardia device - [email or web address]

## 2017-08-31 NOTE — Progress Notes (Signed)
Cardiology Office Note:    Date:  09/02/2017   ID:  Carrie Haynes, DOB May 26, 1941, MRN [id number]  PCP:  Laurann Montana, MD  Cardiologist:  Thurmon Fair, MD    Referring MD: Laurann Montana, MD   Chief complaint: atrial fibrillation follow up  History of Present Illness:    Carrie Haynes is a 76 y.o. female with a hx of hypertension, hyperlipidemia, type 2 diabetes mellitus, remote venous thromboembolic disease, diagnosed with atrial fibrillation with rapid ventricular response in July 2018.  Her echo showed mildly depressed LVEF at 45% with diffuse hypokinesis and moderate mitral regurgitation, mildly dilated left atrium, but also a moderately dilated right atrium with moderate to severe tricuspid regurgitation and mild pulmonary hypertension.  Coronary angiography showed no evidence of significant CAD.  Rate control was difficult.  She underwent cardioversion on August 2 unsuccessfully, but eventually after loading with IV amiodarone was successfully cardioverted on August 7.  She was still in normal rhythm when she saw [place] back on August 15.  Pulmonary function tests (before treatment with amiodarone) raised concern for restrictive lung disease, thereby making long-term treatment with amiodarone undesirable.  She saw Dr. Sandrea Hughs in September.  Past Medical History:  Diagnosis Date  . Diabetes mellitus without complication (HCC)   . Hyperlipemia   . Hypertension     Past Surgical History:  Procedure Laterality Date  . CARDIOVERSION N/A 05/23/2017   Performed by Quintella Reichert, MD at [facility] ENDOSCOPY  . CARDIOVERSION N/A 05/18/2017   Performed by Wendall Stade, MD at [facility] ENDOSCOPY  . Right/Left Heart Cath and Coronary Angiography N/A 05/12/2017   Performed by Lyn Records, MD at [facility] INVASIVE CV LAB  . TRANSESOPHAGEAL ECHOCARDIOGRAM (TEE) N/A 05/18/2017   Performed by Wendall Stade, MD at [facility] ENDOSCOPY    Current Medications: Current Meds  Medication Sig  . atorvastatin  (LIPITOR) 20 MG tablet Take 20 mg by mouth every evening.  . Cholecalciferol (VITAMIN D-3) 5000 units TABS Take 5,000 Units by mouth daily.  Marland Kitchen losartan (COZAAR) 25 MG tablet Take 25 mg by mouth at bedtime.  . metFORMIN (GLUCOPHAGE-XR) 500 MG 24 hr tablet Take 2,000 mg by mouth at bedtime.  . rivaroxaban (XARELTO) 20 MG TABS tablet Take 1 tablet (20 mg total) by mouth daily with supper.  . [DISCONTINUED] amiodarone (PACERONE) 200 MG tablet Take 400 mg (2 tablets) twice daily for 7 days. Then decrease to 200 mg (1 tablet) twice daily for 14 days. Then decrease to 200 mg (1 tablet) daily thereafter.     Allergies:   Calcium channel blockers   Social History   Socioeconomic History  . Marital status: Widowed    Spouse name: None  . Number of children: None  . Years of education: None  . Highest education level: None  Social Needs  . Financial resource strain: None  . Food insecurity - worry: None  . Food insecurity - inability: None  . Transportation needs - medical: None  . Transportation needs - non-medical: None  Occupational History  . None  Tobacco Use  . Smoking status: Never Smoker  . Smokeless tobacco: Never Used  Substance and Sexual Activity  . Alcohol use: No  . Drug use: No  . Sexual activity: None  Other Topics Concern  . None  Social History Narrative  . None     Family History: The patient's family history includes Heart attack in her sister; Hypertension in her father. ROS:  Please see the history of present illness.     All other systems reviewed and are negative.  EKGs/Labs/Other Studies Reviewed:    The following studies were reviewed today: Notes from Dr. Sherene SiresWert in September, records from hospitalization  EKG:  EKG is ordered today.  The ekg ordered today demonstrates normal sinus rhythm, left anterior fascicular block, incomplete right bundle branch block, QRS 94 ms QTC 434 ms  Recent Labs: 05/10/2017: B Natriuretic Peptide 115.2 05/11/2017:  Magnesium 1.9 05/19/2017: ALT 145 07/12/2017: BUN 21; Creatinine, Ser 0.86; Hemoglobin 12.9; Platelets 277.0; Potassium 4.9; Pro B Natriuretic peptide (BNP) 34.0; Sodium 135; TSH 2.93  Recent Lipid Panel    Component Value Date/Time   CHOL 118 05/13/2017 0331   TRIG 48 05/13/2017 0331   HDL 46 05/13/2017 0331   CHOLHDL 2.6 05/13/2017 0331   VLDL 10 05/13/2017 0331   LDLCALC 62 05/13/2017 0331    Physical Exam:    VS:  BP 140/75   Pulse 97   Ht 5' (1.524 m)   Wt 221 lb (100.2 kg)   BMI 43.16 kg/m     Wt Readings from Last 3 Encounters:  08/31/17 221 lb (100.2 kg)  07/12/17 213 lb 9.6 oz (96.9 kg)  05/31/17 215 lb 9.6 oz (97.8 kg)     GEN: Morbidly obese, well nourished, well developed in no acute distress HEENT: Normal NECK: No JVD; No carotid bruits LYMPHATICS: No lymphadenopathy CARDIAC: RRR, no murmurs, rubs, gallops RESPIRATORY:  Clear to auscultation without rales, wheezing or rhonchi  ABDOMEN: Soft, non-tender, non-distended MUSCULOSKELETAL:  No edema; No deformity  SKIN: Warm and dry NEUROLOGIC:  Alert and oriented x 3 PSYCHIATRIC:  Normal affect   ASSESSMENT:    1. Persistent atrial fibrillation (HCC)   2. DCM (dilated cardiomyopathy) (HCC)   3. Cor pulmonale, chronic (HCC)   4. Essential hypertension   5. Morbid obesity due to excess calories (HCC)    PLAN:    In order of problems listed above:  1. AFib: Maintaining normal rhythm roughly 3 months following cardioversion, while on treatment with amiodarone and anticoagulation.  Unfortunately, prior to initiation of amiodarone we were unable to control her ventricular rate even perform a cardioversion successfully.  High risk of atrial fibrillation recurrence with biatrial dilation.  In addition to her age, it is likely that her obesity and chronic lung problems are the substrate for her atrial fibrillation (she has clear evidence of cor pulmonale on echo).  I am not sure whether the cor pulmonale is simply a  consequence of obesity, sleep related breathing or sequelae of previous venous thromboembolic events.  I agree with the concern about long-term side effects of amiodarone, but I also think it is likely we will need to replace this medication with an alternative agents that can provide arrhythmia control and rate control during breakthrough events.  Stop amiodarone.  Start with Toprol succinate 25 mg daily.  Will likely increase the dose of metoprolol as her heart rate increases with amiodarone washout.  Antiarrhythmic options in the future include dofetilide and flecainide (if LV function has normalized, she had normal coronaries on angiography) and Multitak (only the latter would offer benefit of some rate control as well).  Also discussed potential need for radiofrequency ablation for arrhythmia control is difficult. 2. 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  We will repeat a limited echo to reassess LVEF.  She is on an ARB and will start a beta-blocker.  We will also  reassess her pulmonary artery pressure at the time of her echo. 3. Cor pulmonale: As discussed above, unclear to what degree this is due to restrictive lung disease, obesity, previous venous thromboembolic events, possible sleep disordered breathing.  Places her at high risk of future arrhythmia recurrence.  Currently without signs of right heart failure. 4. HTN: Her blood pressure remains relatively high.  I am sure she will tolerate metoprolol and losartan together. 5. Obesity: Discussed the tight direct relationship between weight and burden of atrial fibrillation.  Congratulated her on weight loss so far and encouraged her to keep on trying to lose as much weight as possible.   Medication Adjustments/Labs and Tests Ordered: Current medicines are reviewed at length with the patient today.  Concerns regarding medicines are outlined above.  Orders Placed This Encounter  Procedures  . ECHOCARDIOGRAM LIMITED    Meds ordered this encounter  Medications  . metoprolol succinate (TOPROL-XL) 25 MG 24 hr tablet    Sig: Take 1 tablet (25 mg total) daily by mouth.    Dispense:  90 tablet    Refill:  3    Signed, Thurmon Fair, MD  09/02/2017 3:56 PM    [facility]

## 2017-09-04 ENCOUNTER — Other Ambulatory Visit: Payer: Self-pay | Admitting: Obstetrics and Gynecology

## 2017-09-04 DIAGNOSIS — R928 Other abnormal and inconclusive findings on diagnostic imaging of breast: Secondary | ICD-10-CM

## 2017-09-11 ENCOUNTER — Ambulatory Visit (HOSPITAL_COMMUNITY): Payer: Medicare Other | Attending: Cardiovascular Disease

## 2017-09-11 ENCOUNTER — Other Ambulatory Visit: Payer: Self-pay

## 2017-09-11 DIAGNOSIS — Z6841 Body Mass Index (BMI) 40.0 and over, adult: Secondary | ICD-10-CM | POA: Diagnosis not present

## 2017-09-11 DIAGNOSIS — E119 Type 2 diabetes mellitus without complications: Secondary | ICD-10-CM | POA: Insufficient documentation

## 2017-09-11 DIAGNOSIS — I420 Dilated cardiomyopathy: Secondary | ICD-10-CM | POA: Diagnosis not present

## 2017-09-11 DIAGNOSIS — I509 Heart failure, unspecified: Secondary | ICD-10-CM | POA: Insufficient documentation

## 2017-09-11 DIAGNOSIS — E669 Obesity, unspecified: Secondary | ICD-10-CM | POA: Insufficient documentation

## 2017-09-11 DIAGNOSIS — E785 Hyperlipidemia, unspecified: Secondary | ICD-10-CM | POA: Diagnosis not present

## 2017-09-11 DIAGNOSIS — I110 Hypertensive heart disease with heart failure: Secondary | ICD-10-CM | POA: Diagnosis not present

## 2017-09-11 MED ORDER — PERFLUTREN LIPID MICROSPHERE
1.0000 mL | INTRAVENOUS | Status: AC | PRN
  Administered 2017-09-11: 2 mL via INTRAVENOUS

## 2017-09-12 ENCOUNTER — Ambulatory Visit
Admission: RE | Admit: 2017-09-12 | Discharge: 2017-09-12 | Disposition: A | Discharge status: Home / Self Care | Payer: Medicare Other | Source: Ambulatory Visit | Attending: Obstetrics and Gynecology | Admitting: Obstetrics and Gynecology

## 2017-09-12 ENCOUNTER — Other Ambulatory Visit: Payer: Self-pay | Admitting: Obstetrics and Gynecology

## 2017-09-12 DIAGNOSIS — R928 Other abnormal and inconclusive findings on diagnostic imaging of breast: Secondary | ICD-10-CM

## 2017-09-12 DIAGNOSIS — N6310 Unspecified lump in the right breast, unspecified quadrant: Secondary | ICD-10-CM

## 2017-09-12 DIAGNOSIS — N6001 Solitary cyst of right breast: Secondary | ICD-10-CM | POA: Diagnosis not present

## 2017-09-21 NOTE — Addendum Note (Signed)
Addended by: Neta Ehlers on: 09/21/2017 08:46 AM   Modules accepted: Orders

## 2017-09-26 IMAGING — DX DG CHEST 2V
2 series · 2 of 2 positions shown · non-contrast
Comparison: CT scan of the chest and chest x-ray May 10, 2017
and chest x-ray October 25, 2006.

CLINICAL DATA: Two months of shortness of breath. History of
diabetes, hypertension, CHF

EXAM:
CHEST  2 VIEW

[chest pa]
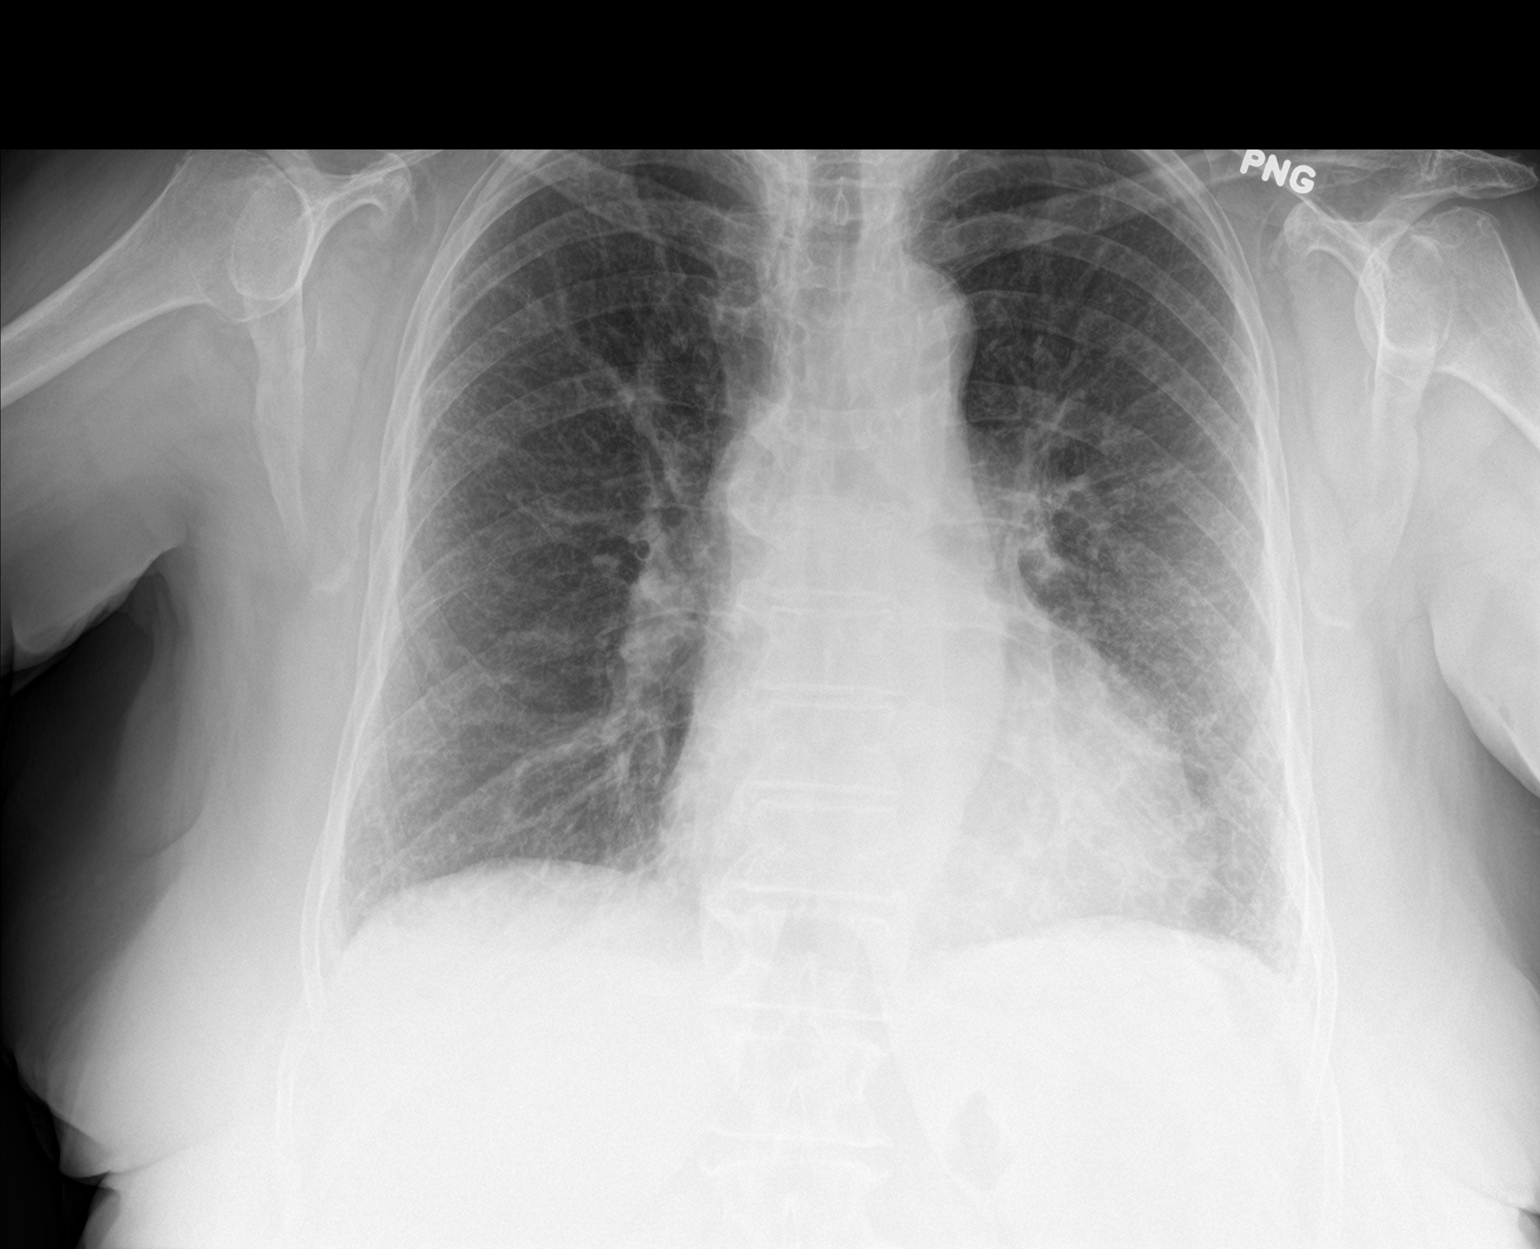

[chest lat]
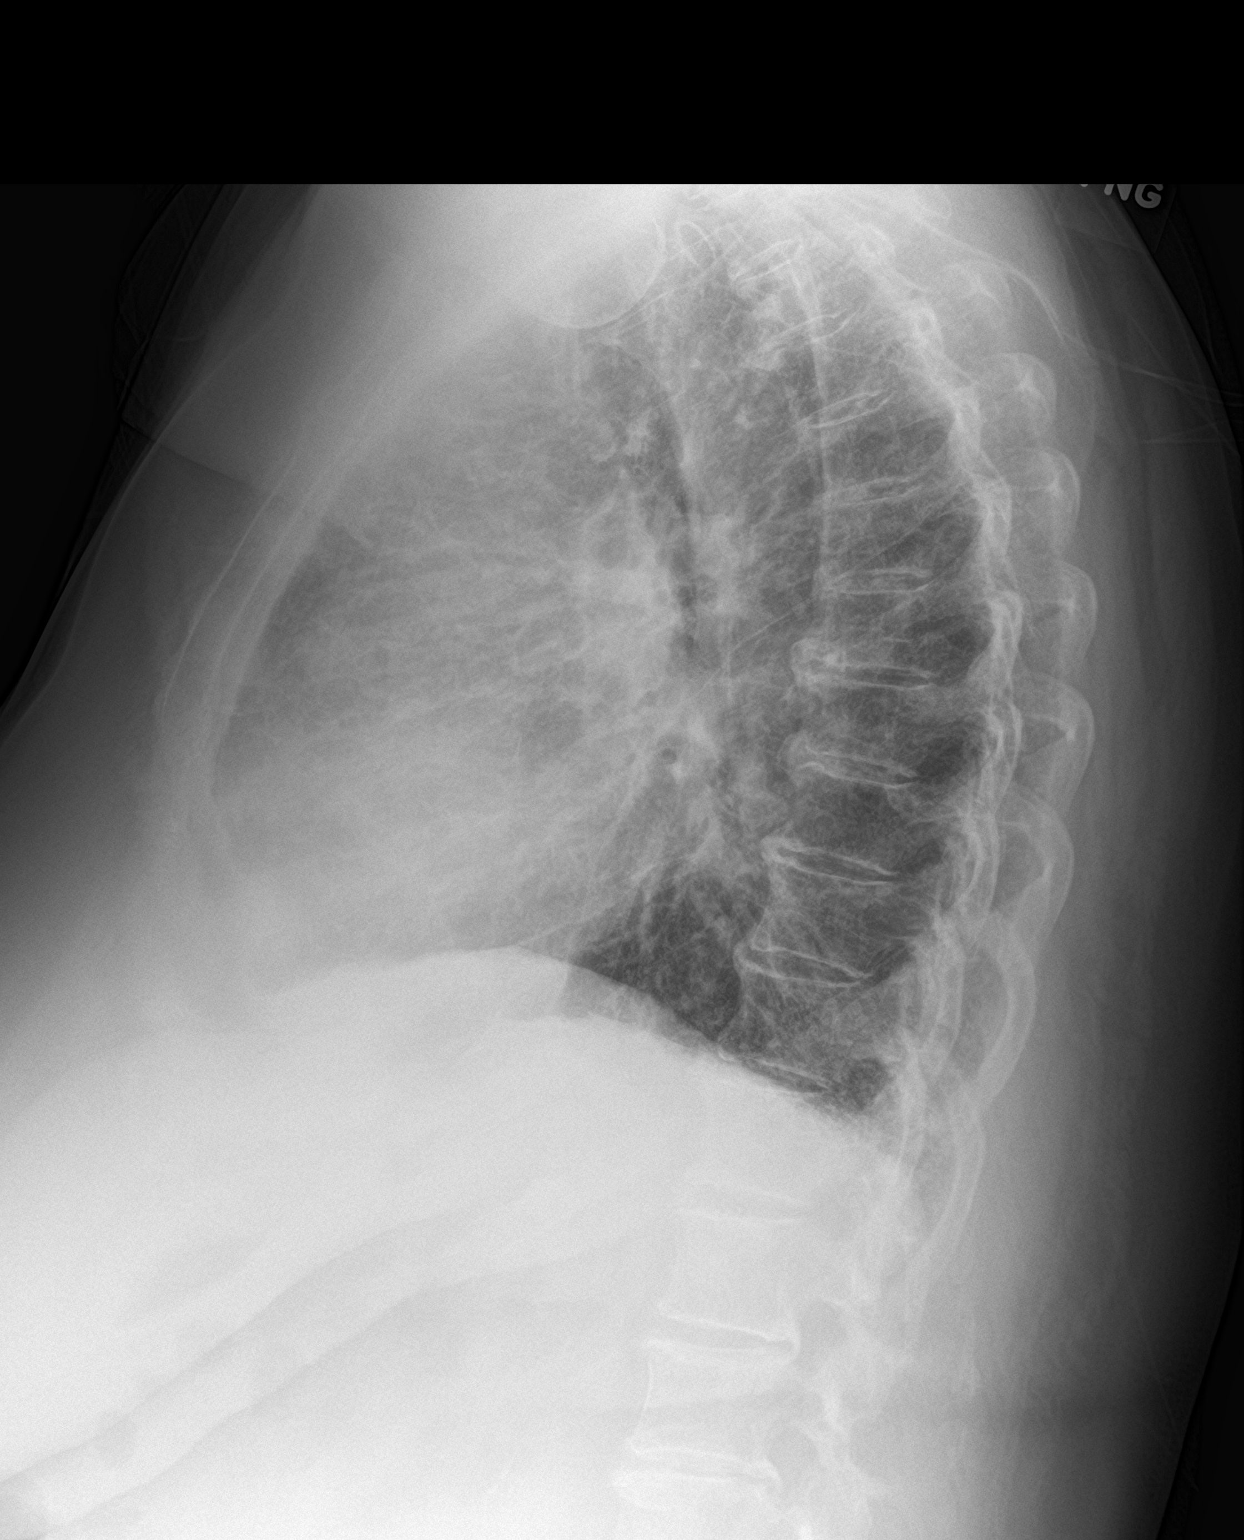

[2 of 2 positions shown; findings below may reference images not displayed]

FINDINGS: The lungs are adequately inflated. The interstitial markings are
coarse. The heart is top-normal in size. The pulmonary vascularity
is not engorged. There is no pleural effusion. There is
calcification in the aortic arch. There is mild tortuosity of the
descending thoracic aorta. There is multilevel degenerative disc
disease of the thoracic spine.
IMPRESSION: Mild chronic bronchitic-reactive airway changes. No pneumonia nor
pulmonary edema.

Thoracic aortic atherosclerosis.

## 2017-10-23 ENCOUNTER — Ambulatory Visit (INDEPENDENT_AMBULATORY_CARE_PROVIDER_SITE_OTHER): Payer: Medicare Other | Admitting: Internal Medicine

## 2017-10-23 ENCOUNTER — Encounter: Payer: Self-pay | Admitting: Internal Medicine

## 2017-10-23 ENCOUNTER — Ambulatory Visit: Payer: Medicare Other | Admitting: Internal Medicine

## 2017-10-23 VITALS — BP 118/74 | HR 74 | Ht 60.0 in | Wt 221.0 lb

## 2017-10-23 DIAGNOSIS — R0609 Other forms of dyspnea: Secondary | ICD-10-CM

## 2017-10-23 DIAGNOSIS — R05 Cough: Secondary | ICD-10-CM | POA: Diagnosis not present

## 2017-10-23 DIAGNOSIS — R0600 Dyspnea, unspecified: Secondary | ICD-10-CM

## 2017-10-23 DIAGNOSIS — R058 Other specified cough: Secondary | ICD-10-CM

## 2017-10-23 LAB — PULMONARY FUNCTION TEST
DL/VA % pred: 83 %
DL/VA: 3.53 ml/min/mmHg/L
DLCO COR % PRED: 51 %
DLCO COR: 9.75 ml/min/mmHg
DLCO unc % pred: 49 %
DLCO unc: 9.34 ml/min/mmHg
FEF 25-75 POST: 3.07 L/s
FEF 25-75 Pre: 2.7 L/sec
FEF2575-%CHANGE-POST: 13 %
FEF2575-%PRED-POST: 207 %
FEF2575-%PRED-PRE: 182 %
FEV1-%CHANGE-POST: 1 %
FEV1-%Pred-Post: 93 %
FEV1-%Pred-Pre: 92 %
FEV1-POST: 1.66 L
FEV1-Pre: 1.64 L
FEV1FVC-%CHANGE-POST: 3 %
FEV1FVC-%Pred-Pre: 121 %
FEV6-%Change-Post: -1 %
FEV6-%Pred-Post: 78 %
FEV6-%Pred-Pre: 79 %
FEV6-POST: 1.76 L
FEV6-Pre: 1.79 L
FEV6FVC-%Change-Post: 0 %
FEV6FVC-%Pred-Post: 105 %
FEV6FVC-%Pred-Pre: 104 %
FVC-%Change-Post: -2 %
FVC-%PRED-PRE: 75 %
FVC-%Pred-Post: 74 %
FVC-PRE: 1.8 L
FVC-Post: 1.76 L
POST FEV1/FVC RATIO: 94 %
PRE FEV1/FVC RATIO: 91 %
Post FEV6/FVC ratio: 100 %
Pre FEV6/FVC Ratio: 100 %
RV % pred: 40 %
RV: 0.84 L
TLC % pred: 59 %
TLC: 2.65 L

## 2017-10-23 NOTE — Assessment & Plan Note (Signed)
-  PFT's  05/22/2017  FVC 1.54 (64%) no obst s rx prior to study with DLCO  49/56 % corrects to 106  % for alv volume   - Amiodarone rx 05/18/17 > 07/17/17  - 07/12/2017  Walked RA x 3 laps @ 185 ft each stopped due to  End of study, fast pace, desat to 88 but held steady there and only mild sob  - ESR 07/12/2017  = 46    PFT's  10/23/2017  FVC 1.80 (75%) no obst with dlco 49/51c and  DLCO 83% for alv vol  - 10/23/2017  Walked RA x 3 laps @ 185 ft each stopped due to  End of study, fast pace,  89% after first and 86% at very end    pfts are unchanged on vs off amiodarone but she's now walking at a faster pace and cough is gone so I don't believe there's a pulmonary issue here as long as avoids amiodarone if possible and starts a more regular ex routine and monitors it consistently   If still trending down off amiodarone next step is HRCT    I had an extended discussion with the patient and husband  reviewing all relevant studies completed to date and  lasting 15 to 20 minutes of a 25 minute summary office visit    Each maintenance medication was reviewed in detail including most importantly the difference between maintenance and prns and under what circumstances the prns are to be triggered using an action plan format that is not reflected in the computer generated alphabetically organized AVS.    Please see AVS for specific instructions unique to this visit that I personally wrote and verbalized to the the pt in detail and then reviewed with pt  by my nurse highlighting any  changes in therapy recommended at today's visit to their plan of care.

## 2017-10-23 NOTE — Assessment & Plan Note (Signed)
Complicated by dm/hbp/ hyperlidemia   Body mass index is 43.16 kg/m.  -  trending up Lab Results  Component Value Date   TSH 2.93 07/12/2017     Contributing to gerd risk/ doe/reviewed the need and the process to achieve and maintain neg calorie balance > defer f/u primary care including intermittently monitoring thyroid status

## 2017-10-23 NOTE — Progress Notes (Signed)
PFT done today. 

## 2017-10-23 NOTE — Assessment & Plan Note (Signed)
Allergy profile 07/12/2017 >  Eos 0.1 /  IgE  436  RAST pos cat > dog > cedar / dust  - trial of gerd diet/ hs h2 rec 07/12/2017     Resolved for now/ consider formal allergy eval if flares in future despite avoidance measures

## 2017-10-23 NOTE — Patient Instructions (Addendum)
Keep up the walking and monitor your  0xygen saturations and call for follow up appt if you see a downward trend from this point walking at a slower pace than what you did today.  Pulmonary follow up can be as needed

## 2017-10-23 NOTE — Progress Notes (Signed)
Subjective:     Patient ID: Carrie Haynes, female   DOB: [birth year],     MRN: [id number]  HPI  52 yowf never smoker with remote dx of AB but did not stay on inhalers and breathing was fine x ? Years/? Decades but since early 2018 noted doe > admitted  Admit date: 05/10/2017 Discharge date: 05/23/2017   Primary Care Provider: Laurann Montana Primary Cardiologist: Dr. Royann Shivers (per patient)  Discharge Diagnoses    Principal Problem:   Atrial fibrillation [facility]) Active Problems:   Dyspnea   Acute on chronic combined systolic and diastolic CHF (congestive heart failure) (HCC)   Asystole (HCC)   Hypotension   Atrial fibrillation with RVR (HCC)   DCM (dilated cardiomyopathy) (HCC)   Allergies      Allergies  Allergen Reactions  . Calcium Channel Blockers Other (See Comments)    Acute hypotension, patient became brady/asystole < 5 seconds     History of Present Illness     Carrie Haynes is a 77 yo female with PMH of NIDDM, HTN, and HL. Reports she is followed by her PCP for chronic issues. Family hx of CAD with half sister having an MI.  Reports she has felt short of breath over the past 6 months, but recently worsened. States 2 nights ago she developed worsening dyspnea, and orthopnea.Complains of SSCPFelt like a stabbing sensation in her chest with deep inspiration Also in between shoulder blades Again, pleuritic.  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 Was on coumadin afterwards, and then taken off. States she had another PE afterwards, and on coumadin again. This was many years ago per her report. Pt has also had some episodes of dizziness over the past couple of weeks, mostly with walking. Flet like she had to hold herself up with wall Also reports some palpitations prior to admission.   Presented to the ED with worsening dyspnea. She was started on IV heparin given concern for possible PE. CTA was  negative for PE but showed evidence to support pulmonary hypertension. Labs showed stable electrolytes, BNP 115, Hgb 10.6, I stat Trop 0.01, Mag 1.5. CXR negative for edema. EKG showed new onset Afib RVR. Placed on IV dilt and rate improved from 140s to 100s. Given 2g IV mag in the ED.    Hospital Course     Consultants: None  She underwent right and left heart catheterization on 05/12/17 with normal coronaries and with elevated filling pressures consistent with acute on chronic combined systolic and diastolic heart failure. Left ventricular systolic dysfunction was thought to be secondary to tachycardia. She was placed on PO amiodarone and underwent TEE/DCCV on 05/18/17. The cardioversion was unsuccessful. She was continued on anticoagulation (xarelto) and started on IV amiodarone with plans for repeat DCCV. Unfortunately, IV amiodarone infiltrated with bilateral thrombophlebitis in both arms, no skin necrosis. Second DCCV without TEE was performed on 05/23/17 with conversion to sinus rhythm. EKG confirms sinus rhythm.   Will continue xarelto and PO amiodarone.   Borderline BP precludes initiation of conventional heart failure medications. Will repeat echo in three months after established NSR to evaluate function. Reduced LV function may be related to Afib and tachycardia.       07/12/2017 1st [facility] office visit/ [name]  Re doe ? Amiodarone related  Chief Complaint  Patient presents with  . Advice Only    Referred by Dr. Royann Shivers. Pt had an abnormal  PFT 05/19/17. Pt admitted to hospital 7/25-8/7 due to atrial fibrillation and was unable to breathe. Pt currently complains of occ. cough and SOB on exertion.  doe x MMRC2 = can't walk a nl pace on a flat grade s sob but does fine slow and flat eg shopping / leaning on cart  When last had nl activity tol wt =  250 and despite wt loss has lost ground with doe over same time frame = about 8 months and much better since admit but not back  to prev baseline Also cough x years sporadic and dry and notes at hs and in am > rest of the day  but no worse on amio Sleeps ok on one pillow  rec Add pepcid 20 mg one hour before bed GERD   Monitor your oxygen level with regular paced walking and let me and Dr C know right away if trending down over several days or weeks (one or two days is not a true "trend" so keep monitoring as regularly as you can)     D/C amiodarone around 07/17/17   10/23/2017  f/u ov/[name] re: doe  Chief Complaint  Patient presents with  . Follow-up    PFT's done today. She states her breathing is back to her normal baseline and she is no longer coughing. No new co's.    sleeps horizontal R side down  Doe improved/ not tracking sats though and vague about what activities she actually attempts to do at this point   No obvious day to day or daytime variability or assoc excess/ purulent sputum or mucus plugs or hemoptysis or cp or chest tightness, subjective wheeze or overt sinus or hb symptoms. No unusual exposure hx or h/o childhood pna/ asthma or knowledge of premature birth.  Sleeping ok flat without nocturnal  or early am exacerbation  of respiratory  c/o's or need for noct saba. Also denies any obvious fluctuation of symptoms with weather or environmental changes or other aggravating or alleviating factors except as outlined above   Current Allergies, Complete Past Medical History, Past Surgical History, Family History, and Social History were reviewed in [name] record.  ROS  The following are not active complaints unless bolded Hoarseness, sore throat, dysphagia, dental problems, itching, sneezing,  nasal congestion or discharge of excess mucus or purulent secretions, ear ache,   fever, chills, sweats, unintended wt loss or wt gain, classically pleuritic or exertional cp,  orthopnea pnd or leg swelling, presyncope, palpitations, abdominal pain, anorexia, nausea, vomiting, diarrhea  or  change in bowel habits or change in bladder habits, change in stools or change in urine, dysuria, hematuria,  rash, arthralgias, visual complaints, headache, numbness, weakness or ataxia or problems with walking or coordination,  change in mood/affect or memory.        Current Meds  Medication Sig  . atorvastatin (LIPITOR) 20 MG tablet Take 20 mg by mouth every evening.  . Cholecalciferol (VITAMIN D-3) 5000 units TABS Take 5,000 Units by mouth daily.  Marland Kitchen losartan (COZAAR) 25 MG tablet Take 25 mg by mouth at bedtime.  . metFORMIN (GLUCOPHAGE-XR) 500 MG 24 hr tablet Take 2,000 mg by mouth at bedtime.  . metoprolol succinate (TOPROL-XL) 25 MG 24 hr tablet Take 1 tablet (25 mg total) daily by mouth.  . rivaroxaban (XARELTO) 20 MG TABS tablet Take 1 tablet (20 mg total) by mouth daily with supper.  Objective:   Physical Exam   amb obese wf nad   10/23/2017         221   07/12/17 213 lb 9.6 oz (96.9 kg)  05/31/17 215 lb 9.6 oz (97.8 kg)  05/23/17 220 lb (99.8 kg)     Vital signs reviewed - Note on arrival 02 sats  99% on RA    HEENT: nl dentition, turbinates bilaterally, and oropharynx. Nl external ear canals without cough reflex   NECK :  without JVD/Nodes/TM/ nl carotid upstrokes bilaterally   LUNGS: no acc muscle use,  Nl contour chest which is clear to A and P bilaterally without cough on insp or exp maneuvers   CV:  RRR  no s3 or murmur or increase in P2, and no edema   ABD:  quite obese but  soft and nontender with limited  inspiratory excursion in the supine position. No bruits or organomegaly appreciated, bowel sounds nl  MS:  Nl gait/ ext warm without deformities, calf tenderness, cyanosis or clubbing No obvious joint restrictions   SKIN: warm and dry without lesions    NEURO:  alert, approp, nl sensorium with  no motor or cerebellar deficits apparent.      Lab Results  Component Value Date   ESRSEDRATE 43 (H) 07/12/2017               Assessment:

## 2017-11-07 DIAGNOSIS — N182 Chronic kidney disease, stage 2 (mild): Secondary | ICD-10-CM | POA: Diagnosis not present

## 2017-11-07 DIAGNOSIS — I129 Hypertensive chronic kidney disease with stage 1 through stage 4 chronic kidney disease, or unspecified chronic kidney disease: Secondary | ICD-10-CM | POA: Diagnosis not present

## 2017-11-07 DIAGNOSIS — E1121 Type 2 diabetes mellitus with diabetic nephropathy: Secondary | ICD-10-CM | POA: Diagnosis not present

## 2017-11-07 DIAGNOSIS — Z1389 Encounter for screening for other disorder: Secondary | ICD-10-CM | POA: Diagnosis not present

## 2017-12-11 ENCOUNTER — Ambulatory Visit: Payer: Medicare Other | Admitting: Cardiovascular Disease

## 2017-12-11 ENCOUNTER — Encounter: Payer: Self-pay | Admitting: Cardiovascular Disease

## 2017-12-11 VITALS — BP 124/72 | HR 71 | Resp 94 | Ht 60.0 in | Wt 226.0 lb

## 2017-12-11 DIAGNOSIS — I2781 Cor pulmonale (chronic): Secondary | ICD-10-CM | POA: Diagnosis not present

## 2017-12-11 DIAGNOSIS — I5032 Chronic diastolic (congestive) heart failure: Secondary | ICD-10-CM | POA: Diagnosis not present

## 2017-12-11 DIAGNOSIS — I481 Persistent atrial fibrillation: Secondary | ICD-10-CM | POA: Diagnosis not present

## 2017-12-11 DIAGNOSIS — I4819 Other persistent atrial fibrillation: Secondary | ICD-10-CM

## 2017-12-11 DIAGNOSIS — I10 Essential (primary) hypertension: Secondary | ICD-10-CM

## 2017-12-11 MED ORDER — METOPROLOL SUCCINATE ER 50 MG PO TB24: 50.0000 mg | ORAL_TABLET | Freq: Every day | ORAL | 3 refills | Status: DC

## 2017-12-11 NOTE — Progress Notes (Signed)
Cardiology Office Note:    Date:  12/11/2017   ID:  Carrie Haynes, DOB Oct 04, 1941, MRN [id number]  PCP:  Carrie Montana, MD  Cardiologist:  Carrie Fair, MD    Referring MD: Carrie Montana, MD   Chief complaint: atrial fibrillation follow up  History of Present Illness:    Carrie Haynes is a 77 y.o. female with a hx of hypertension, hyperlipidemia, type 2 diabetes mellitus, remote venous thromboembolic disease, diagnosed with atrial fibrillation with rapid ventricular response in July 2018.  Her echo showed mildly depressed LVEF at 45% with diffuse hypokinesis and moderate mitral regurgitation, mildly dilated left atrium, but also a moderately dilated right atrium with moderate to severe tricuspid regurgitation and mild pulmonary hypertension.  Coronary angiography showed no evidence of significant CAD.  Rate control was difficult.  She underwent cardioversion on August 2 unsuccessfully, but eventually after loading with IV amiodarone was successfully cardioverted on August 7. Pulmonary function tests (before treatment with amiodarone) raised concern for restrictive lung disease, thereby making long-term treatment with amiodarone undesirable.  Amiodarone was stopped in November and she has not had recurrence of any arrhythmia since.  Follow-up echocardiogram shows complete normalization of left ventricular systolic function, with some residual signs of impaired relaxation.  She was never aware of palpitations even when in atrial fibrillation, but she checks her pulse with a pulse oximeter and an automatic blood pressure cuff rhythm has consistently been regular, usually in the 70s.  Gained 5 pounds since November, although she is trying to lose weight.   The patient specifically denies any chest pain at rest exertion, dyspnea at rest or with exertion, orthopnea, paroxysmal nocturnal dyspnea, syncope, palpitations, focal neurological deficits, intermittent claudication, lower extremity  edema, unexplained weight gain, cough, hemoptysis or wheezing.   Past Medical History:  Diagnosis Date  . Diabetes mellitus without complication (HCC)   . Hyperlipemia   . Hypertension     Past Surgical History:  Procedure Laterality Date  . CARDIOVERSION N/A 05/18/2017   Procedure: CARDIOVERSION;  Surgeon: Carrie Stade, MD;  Location: [facility] ENDOSCOPY;  Service: Cardiovascular;  Laterality: N/A;  . CARDIOVERSION N/A 05/23/2017   Procedure: CARDIOVERSION;  Surgeon: Carrie Reichert, MD;  Location: [facility] ENDOSCOPY;  Service: Cardiovascular;  Laterality: N/A;  . RIGHT/LEFT HEART CATH AND CORONARY ANGIOGRAPHY N/A 05/12/2017   Procedure: Right/Left Heart Cath and Coronary Angiography;  Surgeon: Carrie Records, MD;  Location: [facility] INVASIVE CV LAB;  Service: Cardiovascular;  Laterality: N/A;  . TEE WITHOUT CARDIOVERSION N/A 05/18/2017   Procedure: TRANSESOPHAGEAL ECHOCARDIOGRAM (TEE);  Surgeon: Carrie Stade, MD;  Location: [facility] ENDOSCOPY;  Service: Cardiovascular;  Laterality: N/A;    Current Medications: Current Meds  Medication Sig  . atorvastatin (LIPITOR) 20 MG tablet Take 20 mg by mouth every evening.  . Cholecalciferol (VITAMIN D-3) 5000 units TABS Take 5,000 Units by mouth daily.  . metFORMIN (GLUCOPHAGE-XR) 500 MG 24 hr tablet Take 2,000 mg by mouth at bedtime.  . metoprolol succinate (TOPROL-XL) 50 MG 24 hr tablet Take 1 tablet (50 mg total) by mouth daily.  . rivaroxaban (XARELTO) 20 MG TABS tablet Take 1 tablet (20 mg total) by mouth daily with supper.  . [DISCONTINUED] losartan (COZAAR) 25 MG tablet Take 25 mg by mouth at bedtime.  . [DISCONTINUED] metoprolol succinate (TOPROL-XL) 25 MG 24 hr tablet Take 1 tablet (25 mg total) daily by mouth.     Allergies:   Calcium channel blockers   Social History   Socioeconomic History  .  Marital status: Widowed    Spouse name: None  . Number of children: None  . Years of education: None  . Highest education level: None  Social Needs  .  Financial resource strain: None  . Food insecurity - worry: None  . Food insecurity - inability: None  . Transportation needs - medical: None  . Transportation needs - non-medical: None  Occupational History  . None  Tobacco Use  . Smoking status: Never Smoker  . Smokeless tobacco: Never Used  Substance and Sexual Activity  . Alcohol use: No  . Drug use: No  . Sexual activity: None  Other Topics Concern  . None  Social History Narrative  . None     Family History: The patient's family history includes Breast cancer in her other; Heart attack in her sister; Hypertension in her father. ROS:   Please see the history of present illness.     All other systems reviewed and are negative.  EKGs/Labs/Other Studies Reviewed:    The following studies were reviewed today: Echo Nov 2018  EKG:  EKG is not ordered today.    Recent Labs: 05/10/2017: B Natriuretic Peptide 115.2 05/11/2017: Magnesium 1.9 05/19/2017: ALT 145 07/12/2017: BUN 21; Creatinine, Ser 0.86; Hemoglobin 12.9; Platelets 277.0; Potassium 4.9; Pro B Natriuretic peptide (BNP) 34.0; Sodium 135; TSH 2.93  Recent Lipid Panel    Component Value Date/Time   CHOL 118 05/13/2017 0331   TRIG 48 05/13/2017 0331   HDL 46 05/13/2017 0331   CHOLHDL 2.6 05/13/2017 0331   VLDL 10 05/13/2017 0331   LDLCALC 62 05/13/2017 0331    Physical Exam:    VS:  BP 124/72 (BP Location: Right Arm, Patient Position: Sitting, Cuff Size: Large)   Pulse 71   Resp (!) 94   Ht 5' (1.524 m)   Wt 226 lb (102.5 kg)   BMI 44.14 kg/m     Wt Readings from Last 3 Encounters:  12/11/17 226 lb (102.5 kg)  10/23/17 221 lb (100.2 kg)  08/31/17 221 lb (100.2 kg)     General: Alert, oriented x3, no distress, morbidly obese Head: no evidence of trauma, PERRL, EOMI, no exophtalmos or lid lag, no myxedema, no xanthelasma; normal ears, nose and oropharynx Neck: normal jugular venous pulsations and no hepatojugular reflux; brisk carotid pulses without  delay and no carotid bruits Chest: clear to auscultation, no signs of consolidation by percussion or palpation, normal fremitus, symmetrical and full respiratory excursions Cardiovascular: normal position and quality of the apical impulse, regular rhythm, normal first and second heart sounds, no murmurs, rubs or gallops Abdomen: no tenderness or distention, no masses by palpation, no abnormal pulsatility or arterial bruits, normal bowel sounds, no hepatosplenomegaly Extremities: no clubbing, cyanosis or edema; 2+ radial, ulnar and brachial pulses bilaterally; 2+ right femoral, posterior tibial and dorsalis pedis pulses; 2+ left femoral, posterior tibial and dorsalis pedis pulses; no subclavian or femoral bruits Neurological: grossly nonfocal Psych: Normal mood and affect    ASSESSMENT:    1. Persistent atrial fibrillation (HCC)   2. Chronic diastolic heart failure (HCC)   3. Cor pulmonale, chronic (HCC)   4. Essential hypertension   5. Morbid obesity (HCC)    PLAN:    In order of problems listed above:  1. AFib: Maintaining normal rhythm roughly 6 months following cardioversion, after 3 months off antiarrhythmic medications.  High risk of atrial fibrillation recurrence with biatrial dilation.  In addition to her age, it is likely that her obesity and chronic  lung problems are the substrate for her atrial fibrillation (she has clear evidence of cor pulmonale on echo).  I am not sure whether the cor pulmonale is simply a consequence of obesity, sleep related breathing or sequelae of previous venous thromboembolic events.  We will increase the metoprolol to 50 mg daily. Antiarrhythmic options in the future include dofetilide and flecainide and Multaq (only the latter would offer benefit of some rate control as well).  2. CHF: Resolved. She had tachycardia related cardiomyopathy.  EF has normalized.  Stopped ARB and increase the dose of beta-blocker today. 3. Cor pulmonale: unclear to what degree  this is due to restrictive lung disease, obesity, previous venous thromboembolic events, possible sleep disordered breathing.  Increases high risk of future arrhythmia recurrence.  Currently without signs of right heart failure. 4. HTN: plan metoprolol only, its ability to control rate for breakthrough arrhythmia 5. Obesity: Discussed the tight direct relationship between weight and burden of atrial fibrillation. Eencouraged her to keep on trying to lose as much weight as possible.   Medication Adjustments/Labs and Tests Ordered: Current medicines are reviewed at length with the patient today.  Concerns regarding medicines are outlined above.  No orders of the defined types were placed in this encounter.  Meds ordered this encounter  Medications  . metoprolol succinate (TOPROL-XL) 50 MG 24 hr tablet    Sig: Take 1 tablet (50 mg total) by mouth daily.    Dispense:  90 tablet    Refill:  3    Signed, Carrie Fair, MD  12/11/2017 2:04 PM    [facility]

## 2017-12-11 NOTE — Patient Instructions (Signed)
Dr Royann Shivers has recommended making the following medication changes: 1. STOP Losartan 2. INCREASE Metoprolol to 50 mg daily  Your physician recommends that you schedule a follow-up appointment in 12 months with Dr C. You will receive a reminder letter in the mail two months in advance. If you don't receive a letter, please call our office to schedule the follow-up appointment.  If you need a refill on your cardiac medications before your next appointment, please call your pharmacy.

## 2018-01-25 DIAGNOSIS — Z86010 Personal history of colonic polyps: Secondary | ICD-10-CM | POA: Diagnosis not present

## 2018-03-05 ENCOUNTER — Ambulatory Visit
Admission: RE | Admit: 2018-03-05 | Discharge: 2018-03-05 | Disposition: A | Discharge status: Home / Self Care | Payer: Medicare Other | Source: Ambulatory Visit | Attending: Obstetrics and Gynecology | Admitting: Obstetrics and Gynecology

## 2018-03-05 ENCOUNTER — Ambulatory Visit: Payer: Medicare Other

## 2018-03-05 DIAGNOSIS — N6310 Unspecified lump in the right breast, unspecified quadrant: Secondary | ICD-10-CM

## 2018-03-05 DIAGNOSIS — R928 Other abnormal and inconclusive findings on diagnostic imaging of breast: Secondary | ICD-10-CM | POA: Diagnosis not present

## 2018-03-13 ENCOUNTER — Telehealth: Payer: Self-pay | Admitting: Cardiovascular Disease

## 2018-03-13 NOTE — Telephone Encounter (Signed)
Spoke with pt, aware dose was increased in February.

## 2018-03-13 NOTE — Telephone Encounter (Signed)
New message      Pt c/o medication issue:  1. Name of Medication: metoprolol 2. How are you currently taking this medication (dosage and times per day)?  50mg  daily 3. Are you having a reaction (difficulty breathing--STAT)? no  4. What is your medication issue?  Pt just noticed that her medication was 50mg .  She thought she took 25mg .  Did Dr Royann Shivers change her medication to 50mg ?

## 2018-04-05 DIAGNOSIS — Z0000 Encounter for general adult medical examination without abnormal findings: Secondary | ICD-10-CM | POA: Diagnosis not present

## 2018-04-05 DIAGNOSIS — E785 Hyperlipidemia, unspecified: Secondary | ICD-10-CM | POA: Diagnosis not present

## 2018-04-05 DIAGNOSIS — E1169 Type 2 diabetes mellitus with other specified complication: Secondary | ICD-10-CM | POA: Diagnosis not present

## 2018-04-05 DIAGNOSIS — I10 Essential (primary) hypertension: Secondary | ICD-10-CM | POA: Diagnosis not present

## 2018-06-15 ENCOUNTER — Telehealth: Payer: Self-pay | Admitting: Cardiovascular Disease

## 2018-06-15 NOTE — Telephone Encounter (Signed)
New Message:    *STAT* If patient is at the pharmacy, call can be transferred to refill team.   1. Which medications need to be refilled? (please list name of each medication and dose if known) rivaroxaban (XARELTO) 20 MG TABS tablet  2. Which pharmacy/location (including street and city if local pharmacy) is medication to be sent to? [id number] - [place], [place] - [address]  3. Do they need a 30 day or 90 day supply?  90 days

## 2018-06-16 ENCOUNTER — Telehealth: Payer: Self-pay | Admitting: Cardiology

## 2018-06-16 ENCOUNTER — Other Ambulatory Visit: Payer: Self-pay | Admitting: Cardiovascular Disease

## 2018-06-16 ENCOUNTER — Other Ambulatory Visit: Payer: Self-pay | Admitting: Cardiology

## 2018-06-16 MED ORDER — RIVAROXABAN 20 MG PO TABS: 20.0000 mg | ORAL_TABLET | Freq: Every day | ORAL | 9 refills | Status: DC

## 2018-06-16 NOTE — Telephone Encounter (Signed)
Spoke with patient regarding Xarelto prescription. I have re-sent the prescription into her pharmacy and spoke with the patient personally to let her know.   Georgie Chard NP-C [facility] Pager: [phone]

## 2018-06-19 ENCOUNTER — Other Ambulatory Visit: Payer: Self-pay

## 2018-06-19 MED ORDER — RIVAROXABAN 20 MG PO TABS: 20.0000 mg | ORAL_TABLET | Freq: Every day | ORAL | 4 refills | Status: DC

## 2018-06-19 NOTE — Telephone Encounter (Signed)
Already sent to pharmacy 

## 2018-08-14 DIAGNOSIS — E119 Type 2 diabetes mellitus without complications: Secondary | ICD-10-CM | POA: Diagnosis not present

## 2018-08-14 DIAGNOSIS — Z961 Presence of intraocular lens: Secondary | ICD-10-CM | POA: Diagnosis not present

## 2018-08-14 DIAGNOSIS — H40013 Open angle with borderline findings, low risk, bilateral: Secondary | ICD-10-CM | POA: Diagnosis not present

## 2018-08-14 DIAGNOSIS — H35372 Puckering of macula, left eye: Secondary | ICD-10-CM | POA: Diagnosis not present

## 2018-08-20 DIAGNOSIS — Z23 Encounter for immunization: Secondary | ICD-10-CM | POA: Diagnosis not present

## 2018-10-22 DIAGNOSIS — E785 Hyperlipidemia, unspecified: Secondary | ICD-10-CM | POA: Diagnosis not present

## 2018-10-22 DIAGNOSIS — E559 Vitamin D deficiency, unspecified: Secondary | ICD-10-CM | POA: Diagnosis not present

## 2018-10-22 DIAGNOSIS — I10 Essential (primary) hypertension: Secondary | ICD-10-CM | POA: Diagnosis not present

## 2018-10-22 DIAGNOSIS — E1169 Type 2 diabetes mellitus with other specified complication: Secondary | ICD-10-CM | POA: Diagnosis not present

## 2018-11-30 ENCOUNTER — Ambulatory Visit: Payer: Medicare Other | Admitting: Cardiovascular Disease

## 2018-12-04 ENCOUNTER — Encounter: Payer: Self-pay | Admitting: Physician Assistant

## 2018-12-04 ENCOUNTER — Ambulatory Visit: Payer: Medicare Other | Admitting: Physician Assistant

## 2018-12-04 VITALS — BP 149/69 | HR 78 | Ht 60.0 in | Wt 232.0 lb

## 2018-12-04 DIAGNOSIS — I10 Essential (primary) hypertension: Secondary | ICD-10-CM

## 2018-12-04 DIAGNOSIS — R0600 Dyspnea, unspecified: Secondary | ICD-10-CM

## 2018-12-04 DIAGNOSIS — E785 Hyperlipidemia, unspecified: Secondary | ICD-10-CM

## 2018-12-04 DIAGNOSIS — I480 Paroxysmal atrial fibrillation: Secondary | ICD-10-CM

## 2018-12-04 DIAGNOSIS — R0609 Other forms of dyspnea: Secondary | ICD-10-CM

## 2018-12-04 DIAGNOSIS — E119 Type 2 diabetes mellitus without complications: Secondary | ICD-10-CM | POA: Diagnosis not present

## 2018-12-04 NOTE — Patient Instructions (Signed)
Medication Instructions:   Your physician recommends that you continue on your current medications as directed. Please refer to the Current Medication list given to you today.  If you need a refill on your cardiac medications before your next appointment, please call your pharmacy.   Lab work:  NONE If you have labs (blood work) drawn today and your tests are completely normal, you will receive your results only by: Marland Kitchen MyChart Message (if you have MyChart) OR . A paper copy in the mail If you have any lab test that is abnormal or we need to change your treatment, we will call you to review the results.  Testing/Procedures: NONE  Follow-Up: At [facility], you and your health needs are our priority.  As part of our continuing mission to provide you with exceptional heart care, we have created designated Provider Care Teams.  These Care Teams include your primary Cardiologist (physician) and Advanced Practice Providers (APPs -  Physician Assistants and Nurse Practitioners) who all work together to provide you with the care you need, when you need it. You will need a follow up appointment in 12 months February 2021.  Please call our office 2 months in advance to schedule this appointment (December 2020) with  Thurmon Fair, MD   Any Other Special Instructions Will Be Listed Below (If Applicable).

## 2018-12-04 NOTE — Progress Notes (Signed)
Cardiology Office Note    Date:  12/07/2018   ID:  Carrie Haynes, Carrie Haynes [birth year], MRN [id number]  PCP:  Laurann Montana, MD  Cardiologist:  Dr. Royann Shivers   Chief Complaint  Patient presents with  . Follow-up    seen for Dr. Royann Shivers    History of Present Illness:  Carrie Haynes is a 78 y.o. female with PMH of HTN, HLD, h/o DVT/PE, NIDDM and atrial fibrillation. She presented to [facility] on 05/10/2017 complaining of worsening dyspnea for the past 6 months. She also complained of substernal chest discomfort with deep inspiration. CT was negative for PE. EKG showed new onset of atrial fibrillation with RVR. Echocardiogram obtained on 05/11/2017 showed EF 45-50%, mild diffuse hypokinesis, moderate mitral regurgitation directed eccentrically and posteriorly, mildly dilated left atrium, moderately dilated right atrium, moderate to severe TR, PA peak pressure 39 mmHg. 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 Post cath, she was started on Xarelto.  Due to difficulty with rate control with beta-blocker, she was switched to Cardizem in July 2018.  She became very symptomatic with slow A. fib with heart rate in the 40s to 50s.  Systolic blood pressure also dropped down to 50 to 60s.  She was started on nor epi however did pass out with asystole for less than 5 seconds.  CPR was about to start but patient recovered.  She developed acute kidney injury after the episode was due to hypotension.  She also underwent TEE/DCCV in August 2018 which failed to cardiovert her despite 4 shocks.  She was loaded on amiodarone and underwent successful DC cardioversion on 05/23/2017.  Subsequent PFTs showed restrictive disease and she was referred to pulmonology service.  Amiodarone was discontinued shortly after and she was started on Toprol-XL.  Follow-up repeat limited echo  showed normalization of EF.  Metoprolol was further increased to 50 mg daily.  ARB was stopped to make room for better rate control.  Patient presents today for cardiology office visit.  She has chronic shortness of breath.  On physical exam, she has crackles in bilateral bases concerning for interstitial lung disease.  Her last appointment with Dr. Sherene Sires her pulmonologist was in January 2019, she was instructed to follow-up on a as needed basis.  PFT obtained in January 2019 continue to show restrictive disease which is unchanged on or off of amiodarone.  I will discuss with Dr. Royann Shivers to see if ordering a high-resolution CT would be beneficial in this case.  Overall, she has not had any recurrent palpitation for the past year.  She is doing well on the current therapy and can follow-up in 12 months.  Her blood pressure is high today, however her blood pressure is usually quite well controlled at home.   Past Medical History:  Diagnosis Date  . Diabetes mellitus without complication (HCC)   . Hyperlipemia   . Hypertension     Past Surgical History:  Procedure Laterality Date  . CARDIOVERSION N/A 05/18/2017   Procedure: CARDIOVERSION;  Surgeon: Wendall Stade, MD;  Location: [facility] ENDOSCOPY;  Service: Cardiovascular;  Laterality: N/A;  . CARDIOVERSION N/A 05/23/2017   Procedure: CARDIOVERSION;  Surgeon: Quintella Reichert, MD;  Location: [facility] ENDOSCOPY;  Service: Cardiovascular;  Laterality: N/A;  . RIGHT/LEFT HEART CATH AND CORONARY ANGIOGRAPHY N/A 05/12/2017   Procedure: Right/Left Heart Cath and Coronary Angiography;  Surgeon: Lyn Records, MD;  Location: [facility]  INVASIVE CV LAB;  Service: Cardiovascular;  Laterality: N/A;  . TEE WITHOUT CARDIOVERSION N/A 05/18/2017   Procedure: TRANSESOPHAGEAL ECHOCARDIOGRAM (TEE);  Surgeon: Wendall StadeNishan, Peter C, MD;  [facility];  Service: Cardiovascular;  Laterality: N/A;    Current Medications: Outpatient Medications Prior to Visit  Medication Sig Dispense  Refill  . atorvastatin (LIPITOR) 20 MG tablet Take 20 mg by mouth every evening.  3  . Cholecalciferol (VITAMIN D-3) 5000 units TABS Take 5,000 Units by mouth daily.    . metFORMIN (GLUCOPHAGE-XR) 500 MG 24 hr tablet Take 2,000 mg by mouth at bedtime.    . metoprolol succinate (TOPROL-XL) 50 MG 24 hr tablet Take 1 tablet (50 mg total) by mouth daily. 90 tablet 3  . XARELTO 20 MG TABS tablet TAKE 1 TABLET BY MOUTH DAILY WITH SUPPER 90 tablet 4  . rivaroxaban (XARELTO) 20 MG TABS tablet Take 1 tablet (20 mg total) by mouth daily with supper. 90 tablet 4   No facility-administered medications prior to visit.      Allergies:   Calcium channel blockers   Social History   Socioeconomic History  . Marital status: Widowed    Spouse name: Not on file  . Number of children: Not on file  . Years of education: Not on file  . Highest education level: Not on file  Occupational History  . Not on file  Social Needs  . Financial resource strain: Not on file  . Food insecurity:    Worry: Not on file    Inability: Not on file  . Transportation needs:    Medical: Not on file    Non-medical: Not on file  Tobacco Use  . Smoking status: Never Smoker  . Smokeless tobacco: Never Used  Substance and Sexual Activity  . Alcohol use: No  . Drug use: No  . Sexual activity: Not on file  Lifestyle  . Physical activity:    Days per week: Not on file    Minutes per session: Not on file  . Stress: Not on file  Relationships  . Social connections:    Talks on phone: Not on file    Gets together: Not on file    Attends religious service: Not on file    Active member of club or organization: Not on file    Attends meetings of clubs or organizations: Not on file    Relationship status: Not on file  Other Topics Concern  . Not on file  Social History Narrative  . Not on file     Family History:  The patient's family history includes Breast cancer in an other family member; Heart attack in her  sister; Hypertension in her father.   ROS:   Please see the history of present illness.    ROS All other systems reviewed and are negative.   PHYSICAL EXAM:   VS:  BP (!) 149/69   Pulse 78   Ht 5' (1.524 m)   Wt 232 lb (105.2 kg)   BMI 45.31 kg/m    GEN: Well nourished, well developed, in no acute distress  HEENT: normal  Neck: no JVD, carotid bruits, or masses Cardiac: RRR; no murmurs, rubs, or gallops,no edema  Respiratory: Bibasilar fine crackles GI: soft, nontender, nondistended, + BS MS: no deformity or atrophy  Skin: warm and dry, no rash Neuro:  Alert and Oriented x 3, Strength and sensation are intact Psych: euthymic mood, full affect  Wt Readings from Last 3 Encounters:  12/04/18 232  lb (105.2 kg)  12/11/17 226 lb (102.5 kg)  10/23/17 221 lb (100.2 kg)      Studies/Labs Reviewed:   EKG:  EKG is ordered today.  The ekg ordered today demonstrates normal sinus rhythm with poor R wave progression in anterior leads  Recent Labs: No results found for requested labs within last 8760 hours.   Lipid Panel    Component Value Date/Time   CHOL 118 05/13/2017 0331   TRIG 48 05/13/2017 0331   HDL 46 05/13/2017 0331   CHOLHDL 2.6 05/13/2017 0331   VLDL 10 05/13/2017 0331   LDLCALC 62 05/13/2017 0331    Additional studies/ records that were reviewed today include:   Echo 09/11/2017 LV EF: 55% -   60% Study Conclusions  - Left ventricle: Systolic function was normal. The estimated   ejection fraction was in the range of 55% to 60%. Doppler   parameters are consistent with both elevated ventricular   end-diastolic filling pressure and elevated left atrial filling   pressure. - Aortic valve: No CW doppler done note indicated limited echo with   definity for EF. - Left atrium: The atrium was moderately dilated.    ASSESSMENT:    1. Paroxysmal atrial fibrillation (HCC)   2. Essential hypertension   3. Hyperlipidemia, unspecified hyperlipidemia type   4.  Controlled type 2 diabetes mellitus without complication, without long-term current use of insulin (HCC)   5. Chronic dyspnea      PLAN:  In order of problems listed above:  1. PAF: Patient is maintaining sinus rhythm based on EKG.  Continue Xarelto 20 mg daily  2. Chronic dyspnea: She was on a short course of amiodarone in the past, this has been discontinued.  On physical exam, she has bibasilar crackles concerning for presence of interstitial lung disease.  She was previously seen by Dr. Sherene Sires over a year ago who instructed her to follow-up on as-needed basis.  I will discussed with Dr. Amanda Cockayne to see if there is any benefit of high-resolution CT to confirm diagnosis.  Previous PFT does indicate restrictive disease as well.   3. Hyperlipidemia: On Lipitor 20 mg daily  4. Hypertension: Blood pressure mildly elevated today.  I will hold off on adjusting blood pressure medication for now.  5. DM2: On metformin, managed by primary care provider    Medication Adjustments/Labs and Tests Ordered: Current medicines are reviewed at length with the patient today.  Concerns regarding medicines are outlined above.  Medication changes, Labs and Tests ordered today are listed in the Patient Instructions below. Patient Instructions  Medication Instructions:   Your physician recommends that you continue on your current medications as directed. Please refer to the Current Medication list given to you today.  If you need a refill on your cardiac medications before your next appointment, please call your pharmacy.   Lab work:  NONE If you have labs (blood work) drawn today and your tests are completely normal, you will receive your results only by: Marland Kitchen MyChart Message (if you have MyChart) OR . A paper copy in the mail If you have any lab test that is abnormal or we need to change your treatment, we will call you to review the results.  Testing/Procedures: NONE  Follow-Up: At [facility],  you and your health needs are our priority.  As part of our continuing mission to provide you with exceptional heart care, we have created designated Provider Care Teams.  These Care Teams include your primary Cardiologist (physician)  and Advanced Practice Providers (APPs -  Physician Assistants and Nurse Practitioners) who all work together to provide you with the care you need, when you need it. You will need a follow up appointment in 12 months February 2021.  Please call our office 2 months in advance to schedule this appointment (December 2020) with  Thurmon Fair, MD   Any Other Special Instructions Will Be Listed Below (If Applicable).       Ramond Dial, [place]  12/07/2018 12:03 AM    [facility] [address] [place], [place], [place]  [address] Phone: [phone]; Fax: [phone]

## 2018-12-06 ENCOUNTER — Encounter: Payer: Self-pay | Admitting: Physician Assistant

## 2018-12-10 NOTE — Progress Notes (Signed)
I think I will leave it up to the pulmonary folks to decide whether or not she needs any imaging studies for her lungs.  I am glad her EF has recovered fully. MCr

## 2018-12-26 ENCOUNTER — Telehealth: Payer: Self-pay | Admitting: Cardiovascular Disease

## 2018-12-26 NOTE — Telephone Encounter (Signed)
Called patient, advised of note from Dr.C in the last OV from PA that he would like to keep the pulmonary doctors involved in the imaging of her lungs.  Patient verbalized understanding.

## 2018-12-26 NOTE — Telephone Encounter (Signed)
New Message   Pt states that she saw Azalee Course at her last visit and that he wanted her to have a full CT scan done and he was going to check it over with Dr Royann Shivers  She is following up on this Please call back

## 2019-01-07 ENCOUNTER — Telehealth: Payer: Self-pay | Admitting: Internal Medicine

## 2019-01-07 NOTE — Telephone Encounter (Signed)
Call returned to patient, she states she had an appt on 03/27. She was recently by her cardiologist and she states they told her to needed to f/u with MW. She states they told her she need to have a HRCT. States he was told she had crackling in her lungs. She states they told her it was not an emergency but she does need it done. I made patient aware CT scans are being delayed due to corona. She states she is stable but wanted MW recommendations.   MW please advise.

## 2019-01-08 ENCOUNTER — Other Ambulatory Visit: Payer: Self-pay | Admitting: Cardiovascular Disease

## 2019-01-08 NOTE — Telephone Encounter (Signed)
Returned call to patient and advised per Dr. Rolin Barry instruction. She verbalized understanding and will continue to monitor 02 sats. She walked from bedroom to den and 02 sats are int the 90s. She was made aware to call the office if 02 sats start dropping. Nothing further needed.

## 2019-01-08 NOTE — Telephone Encounter (Signed)
See last instructions:  "Keep up the walking and monitor your  0xygen saturations and call for follow up appt if you see a downward trend from this point walking at a slower pace than what you did today".  So no need for pfts or hrct at this point  unless her self monitoring indicates a downward trend but lets go ahead and get her on the books for first f/u ov we can under the corona guidelines for routine ov and can move it up if worse in meantime as then the risk /benefit would favor w/u with HRCT reasonable as per my last note

## 2019-01-11 ENCOUNTER — Ambulatory Visit: Payer: Medicare Other | Admitting: Internal Medicine

## 2019-04-04 ENCOUNTER — Other Ambulatory Visit: Payer: Self-pay | Admitting: Cardiovascular Disease

## 2019-04-16 ENCOUNTER — Telehealth: Payer: Self-pay | Admitting: Internal Medicine

## 2019-04-16 DIAGNOSIS — R0609 Other forms of dyspnea: Secondary | ICD-10-CM

## 2019-04-16 DIAGNOSIS — R0600 Dyspnea, unspecified: Secondary | ICD-10-CM

## 2019-04-16 NOTE — Telephone Encounter (Signed)
Pt is having typical SOB for her.  Was wondering if she needs to have HRCT.  Wants to know for her peace of mind.  Is on a fixed income and wants to get scan without coming in to avoid a copay.  You may leave a message.  Dr wert please advise.

## 2019-04-16 NOTE — Telephone Encounter (Signed)
The plan was to do HRCT if her ex tolerance/ 02 sats with exercise were trending down but not needed from my perspective if holding steady as no clinical indication for it.  So if the goal is to save money then doesn't one "just to check" her lungs - if sats are trending down go ahead and schedule hrct   If cards needs one for some other reason  Then can certainly order it.

## 2019-04-16 NOTE — Telephone Encounter (Signed)
There is no order for a ct is you look at triage message from 01/07/19 it talks about her Cardiology doc wanting her to have a ct

## 2019-04-16 NOTE — Telephone Encounter (Signed)
Order placed

## 2019-04-18 ENCOUNTER — Ambulatory Visit (INDEPENDENT_AMBULATORY_CARE_PROVIDER_SITE_OTHER): Payer: Medicare Other

## 2019-04-18 ENCOUNTER — Other Ambulatory Visit: Payer: Self-pay

## 2019-04-18 DIAGNOSIS — R0609 Other forms of dyspnea: Secondary | ICD-10-CM

## 2019-04-18 DIAGNOSIS — R0600 Dyspnea, unspecified: Secondary | ICD-10-CM

## 2019-04-18 DIAGNOSIS — R918 Other nonspecific abnormal finding of lung field: Secondary | ICD-10-CM | POA: Diagnosis not present

## 2019-04-18 DIAGNOSIS — I517 Cardiomegaly: Secondary | ICD-10-CM | POA: Diagnosis not present

## 2019-04-18 NOTE — Progress Notes (Signed)
Spoke with pt and notified of results per Dr. Wert. Pt verbalized understanding and denied any questions. 

## 2019-06-06 DIAGNOSIS — I10 Essential (primary) hypertension: Secondary | ICD-10-CM | POA: Diagnosis not present

## 2019-06-06 DIAGNOSIS — Z1389 Encounter for screening for other disorder: Secondary | ICD-10-CM | POA: Diagnosis not present

## 2019-06-06 DIAGNOSIS — E1169 Type 2 diabetes mellitus with other specified complication: Secondary | ICD-10-CM | POA: Diagnosis not present

## 2019-06-06 DIAGNOSIS — Z0000 Encounter for general adult medical examination without abnormal findings: Secondary | ICD-10-CM | POA: Diagnosis not present

## 2019-07-04 DIAGNOSIS — E785 Hyperlipidemia, unspecified: Secondary | ICD-10-CM | POA: Diagnosis not present

## 2019-07-04 DIAGNOSIS — E1169 Type 2 diabetes mellitus with other specified complication: Secondary | ICD-10-CM | POA: Diagnosis not present

## 2019-07-04 DIAGNOSIS — Z23 Encounter for immunization: Secondary | ICD-10-CM | POA: Diagnosis not present

## 2019-07-04 DIAGNOSIS — E559 Vitamin D deficiency, unspecified: Secondary | ICD-10-CM | POA: Diagnosis not present

## 2019-07-09 ENCOUNTER — Other Ambulatory Visit: Payer: Self-pay | Admitting: Cardiovascular Disease

## 2019-07-10 NOTE — Telephone Encounter (Signed)
71f 105.2kg Scr 1.04 07/04/19 ccr 91.30mlmin Lovw/meng 12/04/18

## 2019-08-15 DIAGNOSIS — H40013 Open angle with borderline findings, low risk, bilateral: Secondary | ICD-10-CM | POA: Diagnosis not present

## 2019-08-15 DIAGNOSIS — Z961 Presence of intraocular lens: Secondary | ICD-10-CM | POA: Diagnosis not present

## 2019-08-15 DIAGNOSIS — H35372 Puckering of macula, left eye: Secondary | ICD-10-CM | POA: Diagnosis not present

## 2019-08-15 DIAGNOSIS — H2589 Other age-related cataract: Secondary | ICD-10-CM | POA: Diagnosis not present

## 2019-08-20 ENCOUNTER — Telehealth: Payer: Self-pay | Admitting: Cardiovascular Disease

## 2019-08-20 NOTE — Telephone Encounter (Signed)
Spoke with patient and she has been having increased shortness of breath over the last month. She does not do daily weights and last weights from September. She denies any fever, chills, or possible exposure to COVID that she is aware of. Scheduled appointment for tomorrow with Thomasene Mohair NP and patient will go to ED if worse.

## 2019-08-20 NOTE — Telephone Encounter (Signed)
New Message  Pt c/o Shortness Of Breath: STAT if SOB developed within the last 24 hours or pt is noticeably SOB on the phone  1. Are you currently SOB (can you hear that pt is SOB on the phone)? Yes  2. How long have you been experiencing SOB? Patient does not know, but it has gotten worse.  3. Are you SOB when sitting or when up moving around? Up moving around on exertion and while laying down  4. Are you currently experiencing any other symptoms? Coughing, feeling like she is going to pass out, dizziness,

## 2019-08-21 ENCOUNTER — Other Ambulatory Visit: Payer: Self-pay

## 2019-08-21 ENCOUNTER — Ambulatory Visit (INDEPENDENT_AMBULATORY_CARE_PROVIDER_SITE_OTHER): Payer: Medicare Other | Admitting: General Practice

## 2019-08-21 ENCOUNTER — Encounter: Payer: Self-pay | Admitting: General Practice

## 2019-08-21 VITALS — BP 118/74 | HR 113 | Ht 62.0 in | Wt 228.6 lb

## 2019-08-21 DIAGNOSIS — I10 Essential (primary) hypertension: Secondary | ICD-10-CM

## 2019-08-21 DIAGNOSIS — R0609 Other forms of dyspnea: Secondary | ICD-10-CM | POA: Diagnosis not present

## 2019-08-21 DIAGNOSIS — I480 Paroxysmal atrial fibrillation: Secondary | ICD-10-CM

## 2019-08-21 DIAGNOSIS — E1122 Type 2 diabetes mellitus with diabetic chronic kidney disease: Secondary | ICD-10-CM

## 2019-08-21 DIAGNOSIS — E785 Hyperlipidemia, unspecified: Secondary | ICD-10-CM | POA: Diagnosis not present

## 2019-08-21 MED ORDER — METOPROLOL SUCCINATE ER 50 MG PO TB24: 75.0000 mg | ORAL_TABLET | Freq: Every day | ORAL | 6 refills | Status: DC

## 2019-08-21 NOTE — Patient Instructions (Signed)
INCREASE YOUR METOPROLOL 75MG  DAILY  You are scheduled for a  Cardioversion on 08-28-2019 with Dr. Marlou Porch.  Please arrive at the [facility] (Main Entrance A) at [facility]: [address] [place], [place] [address] at [place] am. (1 hour prior to procedure unless lab work is needed; if lab work is needed arrive 1.5 hours ahead)  DIET: Nothing to eat or drink after midnight except a sip of water with medications (see medication instructions below)  [place] Saturday AT OUR COVID TESTING SATURDAY 08-24-2019 @ BEFORE NOON. AT [place]. YOU MUST SELF- QUARANTINE AFTER COVID TESTING UNTIL YOU LEAVE FOR PROCEDURE ON 08-28-2019. AS TO NOT EXPOSE YOURSELF TO GETTING INFECTED WITH COVID BEFORE YOUR CARDIOVIERSION ADMISSION.  Medication Instructions: Hold METFORMIN THE MORNING OF THE CARDIOVERSION  Continue your anticoagulant: XARELTO You will need to continue your anticoagulant after your procedure until you  are told by your  Provider that it is safe to stop  Labs: If patient is on Coumadin, patient needs pt/INR, CBC, BMET within 3 days (No pt/INR needed for patients taking Xarelto, Eliquis, Pradaxa) For patients receiving anesthesia for TEE and all Cardioversion patients: BMET, CBC within 1 week  You must have a responsible person to drive you home and stay in the waiting area during your procedure. Failure to do so could result in cancellation.  Bring your insurance cards.  *Special Note: Every effort is made to have your procedure done on time. Occasionally there are emergencies that occur at the hospital that may cause delays. Please be patient if a delay does occur.

## 2019-08-21 NOTE — H&P (View-Only) (Signed)
Cardiology Clinic Note   Patient Name: Carrie Haynes Date of Encounter: 08/21/2019  Primary Care Provider:  Laurann Montana, MD Primary Cardiologist:  Thurmon Fair, MD  Patient Profile    Carrie Haynes 78 year old female presents today for follow-up of her atrial fibrillation acute on chronic combined systolic and diastolic CHF and recent increased shortness of breath.  Past Medical History    Past Medical History:  Diagnosis Date   Diabetes mellitus without complication (HCC)    Hyperlipemia    Hypertension    Past Surgical History:  Procedure Laterality Date   CARDIOVERSION N/A 05/18/2017   Procedure: CARDIOVERSION;  Surgeon: Wendall Stade, MD;  Location: [facility] ENDOSCOPY;  Service: Cardiovascular;  Laterality: N/A;   CARDIOVERSION N/A 05/23/2017   Procedure: CARDIOVERSION;  Surgeon: Quintella Reichert, MD;  Location: [facility] ENDOSCOPY;  Service: Cardiovascular;  Laterality: N/A;   RIGHT/LEFT HEART CATH AND CORONARY ANGIOGRAPHY N/A 05/12/2017   Procedure: Right/Left Heart Cath and Coronary Angiography;  Surgeon: Lyn Records, MD;  Location: [facility] INVASIVE CV LAB;  Service: Cardiovascular;  Laterality: N/A;   TEE WITHOUT CARDIOVERSION N/A 05/18/2017   Procedure: TRANSESOPHAGEAL ECHOCARDIOGRAM (TEE);  Surgeon: Wendall Stade, MD;  Location: [facility] ENDOSCOPY;  Service: Cardiovascular;  Laterality: N/A;    Allergies  Allergies  Allergen Reactions   Calcium Channel Blockers Other (See Comments)    Acute hypotension, patient became brady/asystole < 5 seconds    History of Present Illness    Carrie Haynes has a past medical history hypertension, hyperlipidemia, DVT/PE, NIDDM and atrial fibrillation.  On 04/2017 she presented to [facility] complaining of worsening dyspnea over the past several months.  She complained of substernal chest discomfort with deep inspiration.  A CT of her chest was negative for PE.  Her EKG showed new onset atrial fibrillation with rapid ventricular  rate.  An echocardiogram from 05/11/2017 showed an LVEF of 45 to 50% with mild diffuse hypokinesis, moderate mitral regurgitation and posteriorly mildly dilated left atrium, moderately dilated right atrium, moderate to severe TR, and peak PA pressure of 39 mmHg.  She underwent cardiac catheterization 05/12/2017 which indicated normal coronaries.  And today EF of 35 to 40%.  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  After her catheterization she was placed on Xarelto.  Due to difficulty with heart rate control her beta-blocker was switched to Cardizem and 04/2017.  She was symptomatic with slow A. fib and a heart rate in the 40s to 50s.  Her systolic blood pressure also decreased to the 50s to 60s.  She was started on norepinephrine however she did become unresponsive with asystole for less than 5 seconds.  CPR was not started because the patient became responsive and recovered.  She had an episode of acute kidney injury after the episode due to hypotension.  She underwent DCCV in August 2018 which failed despite 4 shocks.  She was then loaded on amiodarone and underwent successful DCCV on 05/2017.  Her subsequent PFTs showed restrictive disease and she was referred to pulmonary service.  Her amiodarone was discontinued and she was started on Toprol XL.  Her follow-up echocardiogram showed normalization of her ejection fraction (09/11/2017).  Her Toprol-XL was further increased to 50 mg daily.  Her ARB was stopped to aid with better rate control.  She was last seen by Azalee Course, PA-C on 12/04/2018.  At that time she had crackles in bilateral bases concerning for interstitial lung disease.  She underwent CT chest  high-resolution on 04/18/2019 which showed no significant changes from her previous CT in 2018 (hypoventilation,no significant bronchiectasis noted. There is likely superimposedbibasilar atelectasis. 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  Annual follow-up was recommended.  She presents to the clinic today after calling on 08/20/2019 stating she had increased shortness of breath.She states she has been more short of breath over the last month.  She states that she does not notice shortness of breath at rest however with activity such as walking or cleaning her house she about becomes progressively short of breath.  She does not notice any heart flutters or palpitations.  She has not been compliant with her Xarelto and has not missed any doses.   She denies chest pain,  lower extremity edema, fatigue, palpitations, melena, hematuria, hemoptysis, diaphoresis, weakness, presyncope, syncope, orthopnea, and PND.   Home Medications    Prior to Admission medications   Medication Sig Start Date End Date Taking? Authorizing Provider  atorvastatin (LIPITOR) 20 MG tablet Take 20 mg by mouth every evening. 05/03/17   [provider]  Cholecalciferol (VITAMIN D-3) 5000 units TABS Take 5,000 Units by mouth daily.    [provider]  metFORMIN (GLUCOPHAGE-XR) 500 MG 24 hr tablet Take 2,000 mg by mouth at bedtime.    [provider]  metoprolol succinate (TOPROL-XL) 50 MG 24 hr tablet Take 1 tablet (50 mg total) by mouth daily. 04/04/19   Croitoru, Mihai, MD  XARELTO 20 MG TABS tablet TAKE 1 TABLET (20 MG TOTAL) BY MOUTH DAILY WITH SUPPER 07/10/19   Almyra Deforest, PA    Family History    Family History  Problem Relation Age of Onset   Hypertension Father    Heart attack Sister    Breast cancer Other    She indicated that the status of her father is unknown. She indicated that the status of her sister is unknown. She indicated that the status of her other is unknown.  Social History    Social History   Socioeconomic History   Marital status: Widowed    Spouse name: Not on file   Number of children: Not on file   Years of education: Not on file   Highest  education level: Not on file  Occupational History   Not on file  Social Needs   Financial resource strain: Not on file   Food insecurity    Worry: Not on file    Inability: Not on file   Transportation needs    Medical: Not on file    Non-medical: Not on file  Tobacco Use   Smoking status: Never Smoker   Smokeless tobacco: Never Used  Substance and Sexual Activity   Alcohol use: No   Drug use: No   Sexual activity: Not on file  Lifestyle   Physical activity    Days per week: Not on file    Minutes per session: Not on file   Stress: Not on file  Relationships   Social connections    Talks on phone: Not on file    Gets together: Not on file    Attends religious service: Not on file    Active member of club or organization: Not on file    Attends meetings of clubs or organizations: Not on file    Relationship status: Not on file   Intimate partner violence    Fear of current or ex partner: Not  on file    Emotionally abused: Not on file    Physically abused: Not on file    Forced sexual activity: Not on file  Other Topics Concern   Not on file  Social History Narrative   Not on file     Review of Systems    General:  No chills, fever, night sweats or weight changes.  Cardiovascular:  No chest pain, dyspnea on exertion, edema, orthopnea, palpitations, paroxysmal nocturnal dyspnea. Dermatological: No rash, lesions/masses Respiratory: ++cough, ++dyspnea with exertion Urologic: No hematuria, dysuria Abdominal:   No nausea, vomiting, diarrhea, bright red blood per rectum, melena, or hematemesis Neurologic:  No visual changes, wkns, changes in mental status. All other systems reviewed and are otherwise negative except as noted above.  Physical Exam    VS:  BP 118/74 (BP Location: Left Arm, Patient Position: Sitting, Cuff Size: Large)    Pulse (!) 113    Ht 5\' 2"  (1.575 m)    Wt 228 lb 9.6 oz (103.7 kg)    SpO2 97%    BMI 41.81 kg/m  , BMI Body mass index  is 41.81 kg/m. GEN: Well nourished, well developed, in no acute distress. HEENT: normal. Neck: Supple, no JVD, carotid bruits, or masses. Cardiac: RRR, no murmurs, rubs, or gallops. No clubbing, cyanosis, edema.  Radials/DP/PT 2+ and equal bilaterally.  Respiratory:  Respirations regular and unlabored, clear to auscultation bilaterally. GI: Soft, nontender, nondistended, BS + x 4. MS: no deformity or atrophy. Skin: warm and dry, no rash. Neuro:  Strength and sensation are intact. Psych: Normal affect.  Accessory Clinical Findings    ECG personally reviewed by me today-atrial fibrillation with rapid ventricular response left axis deviation 113 bpm- No acute changes  EKG 12/04/2018 Normal sinus rhythm 78 bpm  High-resolution CT chest 04/18/2019 IMPRESSION: 1. Evaluation of the lung parenchyma is significantly limited by low lung volumes and significant breath motion artifact throughout. Within this limitation, there appears to be at least some degree of subpleural reticular interstitial opacity with a bibasilar predominance and possible bronchiolectasis at the lung bases. There is no significant bronchiectasis noted. There is likely superimposed bibasilar atelectasis. 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 Again within limitations of comparison, this does not appear significantly changed compared to CT dated 05/10/2017. Consider annual follow-up  2.  Cardiomegaly and coronary artery disease.  Echocardiogram 09/11/2017 Study Conclusions  - Left ventricle: Systolic function was normal. The estimated   ejection fraction was in the range of 55% to 60%. Doppler   parameters are consistent with both elevated ventricular   end-diastolic filling pressure and elevated left atrial filling   pressure. - Aortic valve:  No CW doppler done note indicated limited echo with   definity for EF. - Left atrium: The atrium was moderately dilated.    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 Antonietta Breach Education and reassurance provided   Atrial fibrillation -EKG today atrial fibrillation with rapid ventricular response 113 bpm Increase metoprolol succinate to 75mg  tablet daily Continue Xarelto 20 mg tablet daily Avoid triggers (caffeine, chocolate, nicotine, and other stimulants) Order CMP and CBC today Order DCCV-Case discussed with DOD.  The  patient understands that risks include but are not limited to stroke (1 in 1000), death (1 in 1000), kidney failure [usually temporary] (1 in 500), bleeding (1 in 200), allergic reaction [possibly serious] (1 in 200), and agrees to proceed.  ° ° °Essential hypertension-blood pressure today 118/74.  Well-controlled at home °Continue atorvastatin 20 mg tablet daily °Heart healthy low-sodium high-fiber diet- salty 6 given °Increase physical activity as tolerated ° °Hyperlipidemia-LDL 73 07/04/2019 °Continue atorvastatin 20 mg tablet daily °Heart healthy low-sodium high-fiber diet °Increase physical activity as tolerated ° °Diabetes mellitus type 2-Followed by PCP °Continue Metformin 2000 mg at bedtime °Heart healthy low-sodium carb modified diet °Increase physical activity as tolerated ° °Disposition: Follow-up with me after DCCV. ° °Moorea Boissonneault M. Hazeline Charnley NP-C ° °  °   °[facility] °[address] °Office [phone] Fax [phone] ° °

## 2019-08-21 NOTE — Progress Notes (Signed)
Cardiology Clinic Note   Patient Name: Carrie Haynes Date of Encounter: 08/21/2019  Primary Care Provider:  Laurann Montana, MD Primary Cardiologist:  Thurmon Fair, MD  Patient Profile    Carrie Haynes 78 year old female presents today for follow-up of her atrial fibrillation acute on chronic combined systolic and diastolic CHF and recent increased shortness of breath.  Past Medical History    Past Medical History:  Diagnosis Date   Diabetes mellitus without complication (HCC)    Hyperlipemia    Hypertension    Past Surgical History:  Procedure Laterality Date   CARDIOVERSION N/A 05/18/2017   Procedure: CARDIOVERSION;  Surgeon: Wendall Stade, MD;  Location: [facility] ENDOSCOPY;  Service: Cardiovascular;  Laterality: N/A;   CARDIOVERSION N/A 05/23/2017   Procedure: CARDIOVERSION;  Surgeon: Quintella Reichert, MD;  Location: [facility] ENDOSCOPY;  Service: Cardiovascular;  Laterality: N/A;   RIGHT/LEFT HEART CATH AND CORONARY ANGIOGRAPHY N/A 05/12/2017   Procedure: Right/Left Heart Cath and Coronary Angiography;  Surgeon: Lyn Records, MD;  Location: [facility] INVASIVE CV LAB;  Service: Cardiovascular;  Laterality: N/A;   TEE WITHOUT CARDIOVERSION N/A 05/18/2017   Procedure: TRANSESOPHAGEAL ECHOCARDIOGRAM (TEE);  Surgeon: Wendall Stade, MD;  Location: [facility] ENDOSCOPY;  Service: Cardiovascular;  Laterality: N/A;    Allergies  Allergies  Allergen Reactions   Calcium Channel Blockers Other (See Comments)    Acute hypotension, patient became brady/asystole < 5 seconds    History of Present Illness    Carrie Haynes has a past medical history hypertension, hyperlipidemia, DVT/PE, NIDDM and atrial fibrillation.  On 04/2017 she presented to [facility] complaining of worsening dyspnea over the past several months.  She complained of substernal chest discomfort with deep inspiration.  A CT of her chest was negative for PE.  Her EKG showed new onset atrial fibrillation with rapid ventricular  rate.  An echocardiogram from 05/11/2017 showed an LVEF of 45 to 50% with mild diffuse hypokinesis, moderate mitral regurgitation and posteriorly mildly dilated left atrium, moderately dilated right atrium, moderate to severe TR, and peak PA pressure of 39 mmHg.  She underwent cardiac catheterization 05/12/2017 which indicated normal coronaries.  And today EF of 35 to 40%.  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  After her catheterization she was placed on Xarelto.  Due to difficulty with heart rate control her beta-blocker was switched to Cardizem and 04/2017.  She was symptomatic with slow A. fib and a heart rate in the 40s to 50s.  Her systolic blood pressure also decreased to the 50s to 60s.  She was started on norepinephrine however she did become unresponsive with asystole for less than 5 seconds.  CPR was not started because the patient became responsive and recovered.  She had an episode of acute kidney injury after the episode due to hypotension.  She underwent DCCV in August 2018 which failed despite 4 shocks.  She was then loaded on amiodarone and underwent successful DCCV on 05/2017.  Her subsequent PFTs showed restrictive disease and she was referred to pulmonary service.  Her amiodarone was discontinued and she was started on Toprol XL.  Her follow-up echocardiogram showed normalization of her ejection fraction (09/11/2017).  Her Toprol-XL was further increased to 50 mg daily.  Her ARB was stopped to aid with better rate control.  She was last seen by Azalee Course, PA-C on 12/04/2018.  At that time she had crackles in bilateral bases concerning for interstitial lung disease.  She underwent CT chest  high-resolution on 04/18/2019 which showed no significant changes from her previous CT in 2018 (hypoventilation,no significant bronchiectasis noted. There is likely superimposedbibasilar atelectasis. 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  Annual follow-up was recommended.  She presents to the clinic today after calling on 08/20/2019 stating she had increased shortness of breath.She states she has been more short of breath over the last month.  She states that she does not notice shortness of breath at rest however with activity such as walking or cleaning her house she about becomes progressively short of breath.  She does not notice any heart flutters or palpitations.  She has not been compliant with her Xarelto and has not missed any doses.   She denies chest pain,  lower extremity edema, fatigue, palpitations, melena, hematuria, hemoptysis, diaphoresis, weakness, presyncope, syncope, orthopnea, and PND.   Home Medications    Prior to Admission medications   Medication Sig Start Date End Date Taking? Authorizing Provider  atorvastatin (LIPITOR) 20 MG tablet Take 20 mg by mouth every evening. 05/03/17   [provider]  Cholecalciferol (VITAMIN D-3) 5000 units TABS Take 5,000 Units by mouth daily.    [provider]  metFORMIN (GLUCOPHAGE-XR) 500 MG 24 hr tablet Take 2,000 mg by mouth at bedtime.    [provider]  metoprolol succinate (TOPROL-XL) 50 MG 24 hr tablet Take 1 tablet (50 mg total) by mouth daily. 04/04/19   Croitoru, Mihai, MD  XARELTO 20 MG TABS tablet TAKE 1 TABLET (20 MG TOTAL) BY MOUTH DAILY WITH SUPPER 07/10/19   Almyra Deforest, PA    Family History    Family History  Problem Relation Age of Onset   Hypertension Father    Heart attack Sister    Breast cancer Other    She indicated that the status of her father is unknown. She indicated that the status of her sister is unknown. She indicated that the status of her other is unknown.  Social History    Social History   Socioeconomic History   Marital status: Widowed    Spouse name: Not on file   Number of children: Not on file   Years of education: Not on file   Highest  education level: Not on file  Occupational History   Not on file  Social Needs   Financial resource strain: Not on file   Food insecurity    Worry: Not on file    Inability: Not on file   Transportation needs    Medical: Not on file    Non-medical: Not on file  Tobacco Use   Smoking status: Never Smoker   Smokeless tobacco: Never Used  Substance and Sexual Activity   Alcohol use: No   Drug use: No   Sexual activity: Not on file  Lifestyle   Physical activity    Days per week: Not on file    Minutes per session: Not on file   Stress: Not on file  Relationships   Social connections    Talks on phone: Not on file    Gets together: Not on file    Attends religious service: Not on file    Active member of club or organization: Not on file    Attends meetings of clubs or organizations: Not on file    Relationship status: Not on file   Intimate partner violence    Fear of current or ex partner: Not  on file    Emotionally abused: Not on file    Physically abused: Not on file    Forced sexual activity: Not on file  Other Topics Concern   Not on file  Social History Narrative   Not on file     Review of Systems    General:  No chills, fever, night sweats or weight changes.  Cardiovascular:  No chest pain, dyspnea on exertion, edema, orthopnea, palpitations, paroxysmal nocturnal dyspnea. Dermatological: No rash, lesions/masses Respiratory: ++cough, ++dyspnea with exertion Urologic: No hematuria, dysuria Abdominal:   No nausea, vomiting, diarrhea, bright red blood per rectum, melena, or hematemesis Neurologic:  No visual changes, wkns, changes in mental status. All other systems reviewed and are otherwise negative except as noted above.  Physical Exam    VS:  BP 118/74 (BP Location: Left Arm, Patient Position: Sitting, Cuff Size: Large)    Pulse (!) 113    Ht 5\' 2"  (1.575 m)    Wt 228 lb 9.6 oz (103.7 kg)    SpO2 97%    BMI 41.81 kg/m  , BMI Body mass index  is 41.81 kg/m. GEN: Well nourished, well developed, in no acute distress. HEENT: normal. Neck: Supple, no JVD, carotid bruits, or masses. Cardiac: RRR, no murmurs, rubs, or gallops. No clubbing, cyanosis, edema.  Radials/DP/PT 2+ and equal bilaterally.  Respiratory:  Respirations regular and unlabored, clear to auscultation bilaterally. GI: Soft, nontender, nondistended, BS + x 4. MS: no deformity or atrophy. Skin: warm and dry, no rash. Neuro:  Strength and sensation are intact. Psych: Normal affect.  Accessory Clinical Findings    ECG personally reviewed by me today-atrial fibrillation with rapid ventricular response left axis deviation 113 bpm- No acute changes  EKG 12/04/2018 Normal sinus rhythm 78 bpm  High-resolution CT chest 04/18/2019 IMPRESSION: 1. Evaluation of the lung parenchyma is significantly limited by low lung volumes and significant breath motion artifact throughout. Within this limitation, there appears to be at least some degree of subpleural reticular interstitial opacity with a bibasilar predominance and possible bronchiolectasis at the lung bases. There is no significant bronchiectasis noted. There is likely superimposed bibasilar atelectasis. 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 Again within limitations of comparison, this does not appear significantly changed compared to CT dated 05/10/2017. Consider annual follow-up  2.  Cardiomegaly and coronary artery disease.  Echocardiogram 09/11/2017 Study Conclusions  - Left ventricle: Systolic function was normal. The estimated   ejection fraction was in the range of 55% to 60%. Doppler   parameters are consistent with both elevated ventricular   end-diastolic filling pressure and elevated left atrial filling   pressure. - Aortic valve:  No CW doppler done note indicated limited echo with   definity for EF. - Left atrium: The atrium was moderately dilated.    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 Antonietta Breach Education and reassurance provided   Atrial fibrillation -EKG today atrial fibrillation with rapid ventricular response 113 bpm Increase metoprolol succinate to 75mg  tablet daily Continue Xarelto 20 mg tablet daily Avoid triggers (caffeine, chocolate, nicotine, and other stimulants) Order CMP and CBC today Order DCCV-Case discussed with DOD.  The  patient understands that risks include but are not limited to stroke (1 in 1000), death (1 in 1000), kidney failure [usually temporary] (1 in 500), bleeding (1 in 200), allergic reaction [possibly serious] (1 in 200), and agrees to proceed.    Essential hypertension-blood pressure today 118/74.  Well-controlled at home Continue atorvastatin 20 mg tablet daily Heart healthy low-sodium high-fiber diet- salty 6 given Increase physical activity as tolerated  Hyperlipidemia-LDL 73 07/04/2019 Continue atorvastatin 20 mg tablet daily Heart healthy low-sodium high-fiber diet Increase physical activity as tolerated  Diabetes mellitus type 2-Followed by PCP Continue Metformin 2000 mg at bedtime Heart healthy low-sodium carb modified diet Increase physical activity as tolerated  Disposition: Follow-up with me after DCCV.  Thomasene RippleJesse M. Toyna Erisman NP-C       [facility] [address] Office [phone] Fax [phone]

## 2019-08-22 LAB — COMPREHENSIVE METABOLIC PANEL
ALT: 19 IU/L (ref 0–32)
AST: 21 IU/L (ref 0–40)
Albumin/Globulin Ratio: 1.5 (ref 1.2–2.2)
Albumin: 4 g/dL (ref 3.7–4.7)
Alkaline Phosphatase: 66 IU/L (ref 39–117)
BUN/Creatinine Ratio: 20 (ref 12–28)
BUN: 22 mg/dL (ref 8–27)
Bilirubin Total: 0.8 mg/dL (ref 0.0–1.2)
CO2: 21 mmol/L (ref 20–29)
Calcium: 9 mg/dL (ref 8.7–10.3)
Chloride: 103 mmol/L (ref 96–106)
Creatinine, Ser: 1.1 mg/dL — ABNORMAL HIGH (ref 0.57–1.00)
GFR calc Af Amer: 56 mL/min/{1.73_m2} — ABNORMAL LOW (ref >59)
GFR calc non Af Amer: 49 mL/min/{1.73_m2} — ABNORMAL LOW (ref >59)
Globulin, Total: 2.6 g/dL (ref 1.5–4.5)
Glucose: 104 mg/dL — ABNORMAL HIGH (ref 65–99)
Potassium: 4.6 mmol/L (ref 3.5–5.2)
Sodium: 138 mmol/L (ref 134–144)
Total Protein: 6.6 g/dL (ref 6.0–8.5)

## 2019-08-22 LAB — CBC
Hematocrit: 34.4 % (ref 34.0–46.6)
Hemoglobin: 11.3 g/dL (ref 11.1–15.9)
MCH: 30.5 pg (ref 26.6–33.0)
MCHC: 32.8 g/dL (ref 31.5–35.7)
MCV: 93 fL (ref 79–97)
Platelets: 217 10*3/uL (ref 150–450)
RBC: 3.71 x10E6/uL — ABNORMAL LOW (ref 3.77–5.28)
RDW: 12.8 % (ref 11.7–15.4)
WBC: 6 10*3/uL (ref 3.4–10.8)

## 2019-08-23 ENCOUNTER — Inpatient Hospital Stay (HOSPITAL_COMMUNITY): Admission: RE | Admit: 2019-08-23 | Payer: Medicare Other | Source: Ambulatory Visit

## 2019-08-23 NOTE — Progress Notes (Signed)
Sounds like a good plan

## 2019-08-24 ENCOUNTER — Other Ambulatory Visit (HOSPITAL_COMMUNITY)
Admission: RE | Admit: 2019-08-24 | Discharge: 2019-08-24 | Disposition: A | Discharge status: Home / Self Care | Payer: Medicare Other | Source: Ambulatory Visit | Attending: Cardiology | Admitting: Cardiology

## 2019-08-24 ENCOUNTER — Other Ambulatory Visit (HOSPITAL_COMMUNITY): Payer: Medicare Other

## 2019-08-24 DIAGNOSIS — Z20828 Contact with and (suspected) exposure to other viral communicable diseases: Secondary | ICD-10-CM | POA: Diagnosis not present

## 2019-08-24 DIAGNOSIS — Z01812 Encounter for preprocedural laboratory examination: Secondary | ICD-10-CM | POA: Diagnosis not present

## 2019-08-25 LAB — NOVEL CORONAVIRUS, NAA (HOSP ORDER, SEND-OUT TO REF LAB; TAT 18-24 HRS): SARS-CoV-2, NAA: NOT DETECTED

## 2019-08-28 ENCOUNTER — Other Ambulatory Visit: Payer: Self-pay

## 2019-08-28 ENCOUNTER — Ambulatory Visit (HOSPITAL_COMMUNITY): Payer: Medicare Other | Admitting: Anesthesiology

## 2019-08-28 ENCOUNTER — Encounter (HOSPITAL_COMMUNITY)
Admission: RE | Disposition: A | Discharge status: Home / Self Care | Payer: Self-pay | Source: Ambulatory Visit | Attending: Cardiology

## 2019-08-28 ENCOUNTER — Encounter (HOSPITAL_COMMUNITY): Payer: Self-pay | Admitting: Emergency Medicine

## 2019-08-28 ENCOUNTER — Ambulatory Visit (HOSPITAL_COMMUNITY)
Admission: RE | Admit: 2019-08-28 | Discharge: 2019-08-28 | Disposition: A | Discharge status: Home / Self Care | Payer: Medicare Other | Source: Ambulatory Visit | Attending: Cardiology | Admitting: Cardiology

## 2019-08-28 DIAGNOSIS — I480 Paroxysmal atrial fibrillation: Secondary | ICD-10-CM | POA: Diagnosis not present

## 2019-08-28 DIAGNOSIS — I5043 Acute on chronic combined systolic (congestive) and diastolic (congestive) heart failure: Secondary | ICD-10-CM | POA: Insufficient documentation

## 2019-08-28 DIAGNOSIS — I4891 Unspecified atrial fibrillation: Secondary | ICD-10-CM | POA: Diagnosis not present

## 2019-08-28 DIAGNOSIS — I420 Dilated cardiomyopathy: Secondary | ICD-10-CM | POA: Diagnosis not present

## 2019-08-28 DIAGNOSIS — Z86718 Personal history of other venous thrombosis and embolism: Secondary | ICD-10-CM | POA: Insufficient documentation

## 2019-08-28 DIAGNOSIS — I110 Hypertensive heart disease with heart failure: Secondary | ICD-10-CM | POA: Insufficient documentation

## 2019-08-28 DIAGNOSIS — Z7901 Long term (current) use of anticoagulants: Secondary | ICD-10-CM | POA: Diagnosis not present

## 2019-08-28 DIAGNOSIS — Z7984 Long term (current) use of oral hypoglycemic drugs: Secondary | ICD-10-CM | POA: Diagnosis not present

## 2019-08-28 DIAGNOSIS — E785 Hyperlipidemia, unspecified: Secondary | ICD-10-CM | POA: Insufficient documentation

## 2019-08-28 DIAGNOSIS — Z79899 Other long term (current) drug therapy: Secondary | ICD-10-CM | POA: Diagnosis not present

## 2019-08-28 DIAGNOSIS — I959 Hypotension, unspecified: Secondary | ICD-10-CM | POA: Diagnosis not present

## 2019-08-28 DIAGNOSIS — E119 Type 2 diabetes mellitus without complications: Secondary | ICD-10-CM | POA: Insufficient documentation

## 2019-08-28 HISTORY — PX: CARDIOVERSION: SHX1299

## 2019-08-28 LAB — GLUCOSE, CAPILLARY: Glucose-Capillary: 121 mg/dL — ABNORMAL HIGH (ref 70–99)

## 2019-08-28 SURGERY — CARDIOVERSION
Anesthesia: General

## 2019-08-28 MED ORDER — LIDOCAINE 2% (20 MG/ML) 5 ML SYRINGE
INTRAMUSCULAR | Status: DC | PRN
  Administered 2019-08-28: 40 mg via INTRAVENOUS
  Administered 2019-08-28: 20 mg via INTRAVENOUS

## 2019-08-28 MED ORDER — PROPOFOL 10 MG/ML IV BOLUS
INTRAVENOUS | Status: DC | PRN
  Administered 2019-08-28: 70 mg via INTRAVENOUS
  Administered 2019-08-28: 30 mg via INTRAVENOUS

## 2019-08-28 MED ORDER — SODIUM CHLORIDE 0.9 % IV SOLN
INTRAVENOUS | Status: DC | PRN
  Administered 2019-08-28: 09:00:00 via INTRAVENOUS

## 2019-08-28 MED ORDER — PHENYLEPHRINE HCL (PRESSORS) 10 MG/ML IV SOLN
INTRAVENOUS | Status: DC | PRN
  Administered 2019-08-28: 80 ug via INTRAVENOUS
  Administered 2019-08-28: 120 ug via INTRAVENOUS

## 2019-08-28 MED ORDER — ALBUTEROL SULFATE (2.5 MG/3ML) 0.083% IN NEBU
INHALATION_SOLUTION | RESPIRATORY_TRACT | Status: AC
  Filled 2019-08-28: qty 3

## 2019-08-28 NOTE — Transfer of Care (Signed)
Immediate Anesthesia Transfer of Care Note  Patient: Carrie Haynes  Procedure(s) Performed: CARDIOVERSION (N/A )  Patient Location: PACU and Endoscopy Unit  Anesthesia Type:General  Level of Consciousness: awake and patient cooperative  Airway & Oxygen Therapy: Patient Spontanous Breathing and Patient connected to nasal cannula oxygen  Post-op Assessment: Report given to RN, Post -op Vital signs reviewed and stable and Patient moving all extremities  Post vital signs: Reviewed and stable  Last Vitals:  Vitals Value Taken Time  BP    Temp    Pulse    Resp    SpO2      Last Pain:  Vitals:   08/28/19 0821  TempSrc: Temporal  PainSc: 0-No pain         Complications: No apparent anesthesia complications

## 2019-08-28 NOTE — Interval H&P Note (Signed)
History and Physical Interval Note:  08/28/2019 9:31 AM  Carrie Haynes  has presented today for surgery, with the diagnosis of AFIB.  The various methods of treatment have been discussed with the patient and family. After consideration of risks, benefits and other options for treatment, the patient has consented to  Procedure(s): CARDIOVERSION (N/A) as a surgical intervention.  The patient's history has been reviewed, patient examined, no change in status, stable for surgery.  I have reviewed the patient's chart and labs.  Questions were answered to the patient's satisfaction.     [employer]

## 2019-08-28 NOTE — Anesthesia Preprocedure Evaluation (Addendum)
Anesthesia Evaluation  Patient identified by MRN, date of birth, ID band Patient awake    Reviewed: Allergy & Precautions, NPO status , Patient's Chart, lab work & pertinent test results, reviewed documented beta blocker date and time   History of Anesthesia Complications Negative for: history of anesthetic complications  Airway Mallampati: II  TM Distance: >3 FB Neck ROM: Full    Dental  (+) Partial Upper   Pulmonary neg pulmonary ROS,    Pulmonary exam normal        Cardiovascular hypertension, Pt. on home beta blockers and Pt. on medications + DOE  Normal cardiovascular exam+ dysrhythmias (on Xarelto) Atrial Fibrillation   TTE 2018: EF 55-60%, moderate LAE   Neuro/Psych negative neurological ROS  negative psych ROS   GI/Hepatic negative GI ROS, Neg liver ROS,   Endo/Other  diabetes, Type 2, Oral Hypoglycemic AgentsMorbid obesity  Renal/GU negative Renal ROS  negative genitourinary   Musculoskeletal negative musculoskeletal ROS (+)   Abdominal   Peds  Hematology negative hematology ROS (+)   Anesthesia Other Findings Day of surgery medications reviewed with patient.  Reproductive/Obstetrics negative OB ROS                            Anesthesia Physical Anesthesia Plan  ASA: III  Anesthesia Plan: General   Post-op Pain Management:    Induction: Intravenous  PONV Risk Score and Plan: Treatment may vary due to age or medical condition and Propofol infusion  Airway Management Planned: Mask  Additional Equipment: None  Intra-op Plan:   Post-operative Plan:   Informed Consent: I have reviewed the patients History and Physical, chart, labs and discussed the procedure including the risks, benefits and alternatives for the proposed anesthesia with the patient or authorized representative who has indicated his/her understanding and acceptance.     Dental advisory given  Plan  Discussed with: CRNA  Anesthesia Plan Comments:        Anesthesia Quick Evaluation

## 2019-08-28 NOTE — Discharge Instructions (Signed)

## 2019-08-28 NOTE — CV Procedure (Signed)
    Electrical Cardioversion Procedure Note Carrie Haynes [id number] [birth year]  Procedure: Electrical Cardioversion Indications:  Atrial Fibrillation  Time Out: Verified patient identification, verified procedure,medications/allergies/relevent history reviewed, required imaging and test results available.  Performed  Procedure Details  The patient was NPO after midnight. Anesthesia was administered at the beside  by Dr. Wallie Renshaw with propofol.  Cardioversion was performed with synchronized biphasic defibrillation via AP pads with 120, 200, 200 joules.  3 attempt(s) were performed.  The patient converted to normal sinus rhythm. The patient tolerated the procedure well   IMPRESSION:  Successful cardioversion of atrial fibrillation after 3rd try, 200 J.     Carrie Haynes 08/28/2019, 9:43 AM

## 2019-08-28 NOTE — Anesthesia Procedure Notes (Signed)
Procedure Name: General with mask airway Date/Time: 08/28/2019 9:38 AM Performed by: Lowella Dell, CRNA Pre-anesthesia Checklist: Patient identified, Suction available, Emergency Drugs available, Patient being monitored and Timeout performed Patient Re-evaluated:Patient Re-evaluated prior to induction Oxygen Delivery Method: Ambu bag Preoxygenation: Pre-oxygenation with 100% oxygen Induction Type: IV induction Ventilation: Mask ventilation without difficulty Placement Confirmation: positive ETCO2 Dental Injury: Teeth and Oropharynx as per pre-operative assessment

## 2019-08-28 NOTE — Anesthesia Postprocedure Evaluation (Signed)
Anesthesia Post Note  Patient: JAKIYAH STEPNEY  Procedure(s) Performed: CARDIOVERSION (N/A )     Patient location during evaluation: PACU Anesthesia Type: General Level of consciousness: awake and alert and oriented Pain management: pain level controlled Vital Signs Assessment: post-procedure vital signs reviewed and stable Respiratory status: spontaneous breathing, nonlabored ventilation and respiratory function stable Cardiovascular status: blood pressure returned to baseline Postop Assessment: no apparent nausea or vomiting Anesthetic complications: no    Last Vitals:  Vitals:   08/28/19 1000 08/28/19 1010  BP: (!) 93/57 108/62  Pulse: 67 67  Resp: (!) 31 (!) 26  Temp:    SpO2: 100% 99%    Last Pain:  Vitals:   08/28/19 1010  TempSrc:   PainSc: 0-No pain                 Brennan Bailey

## 2019-08-30 ENCOUNTER — Encounter (HOSPITAL_COMMUNITY): Payer: Self-pay | Admitting: Cardiology

## 2019-09-02 ENCOUNTER — Other Ambulatory Visit: Payer: Self-pay

## 2019-09-02 ENCOUNTER — Inpatient Hospital Stay (HOSPITAL_COMMUNITY)
Admission: EM | Admit: 2019-09-02 | Discharge: 2019-09-09 | DRG: 308 | Disposition: A | Discharge status: Home / Self Care | Payer: Medicare Other | Attending: Cardiovascular Disease | Admitting: Cardiovascular Disease

## 2019-09-02 ENCOUNTER — Telehealth: Payer: Self-pay

## 2019-09-02 ENCOUNTER — Emergency Department (HOSPITAL_COMMUNITY): Payer: Medicare Other

## 2019-09-02 DIAGNOSIS — I4891 Unspecified atrial fibrillation: Secondary | ICD-10-CM | POA: Diagnosis not present

## 2019-09-02 DIAGNOSIS — I071 Rheumatic tricuspid insufficiency: Secondary | ICD-10-CM | POA: Diagnosis present

## 2019-09-02 DIAGNOSIS — I2510 Atherosclerotic heart disease of native coronary artery without angina pectoris: Secondary | ICD-10-CM | POA: Diagnosis present

## 2019-09-02 DIAGNOSIS — Z7984 Long term (current) use of oral hypoglycemic drugs: Secondary | ICD-10-CM

## 2019-09-02 DIAGNOSIS — E785 Hyperlipidemia, unspecified: Secondary | ICD-10-CM | POA: Diagnosis not present

## 2019-09-02 DIAGNOSIS — I700 Atherosclerosis of aorta: Secondary | ICD-10-CM | POA: Diagnosis not present

## 2019-09-02 DIAGNOSIS — I480 Paroxysmal atrial fibrillation: Secondary | ICD-10-CM | POA: Diagnosis not present

## 2019-09-02 DIAGNOSIS — Z86718 Personal history of other venous thrombosis and embolism: Secondary | ICD-10-CM | POA: Diagnosis not present

## 2019-09-02 DIAGNOSIS — I10 Essential (primary) hypertension: Secondary | ICD-10-CM | POA: Diagnosis not present

## 2019-09-02 DIAGNOSIS — I5043 Acute on chronic combined systolic (congestive) and diastolic (congestive) heart failure: Secondary | ICD-10-CM | POA: Diagnosis not present

## 2019-09-02 DIAGNOSIS — I428 Other cardiomyopathies: Secondary | ICD-10-CM | POA: Diagnosis not present

## 2019-09-02 DIAGNOSIS — I4819 Other persistent atrial fibrillation: Secondary | ICD-10-CM | POA: Diagnosis not present

## 2019-09-02 DIAGNOSIS — Z6841 Body Mass Index (BMI) 40.0 and over, adult: Secondary | ICD-10-CM | POA: Diagnosis not present

## 2019-09-02 DIAGNOSIS — E119 Type 2 diabetes mellitus without complications: Secondary | ICD-10-CM | POA: Diagnosis present

## 2019-09-02 DIAGNOSIS — Z79899 Other long term (current) drug therapy: Secondary | ICD-10-CM

## 2019-09-02 DIAGNOSIS — Z20828 Contact with and (suspected) exposure to other viral communicable diseases: Secondary | ICD-10-CM | POA: Diagnosis present

## 2019-09-02 DIAGNOSIS — R0602 Shortness of breath: Secondary | ICD-10-CM | POA: Diagnosis not present

## 2019-09-02 DIAGNOSIS — I110 Hypertensive heart disease with heart failure: Secondary | ICD-10-CM | POA: Diagnosis present

## 2019-09-02 DIAGNOSIS — Z7901 Long term (current) use of anticoagulants: Secondary | ICD-10-CM | POA: Diagnosis not present

## 2019-09-02 DIAGNOSIS — Z8249 Family history of ischemic heart disease and other diseases of the circulatory system: Secondary | ICD-10-CM | POA: Diagnosis not present

## 2019-09-02 DIAGNOSIS — I5042 Chronic combined systolic (congestive) and diastolic (congestive) heart failure: Secondary | ICD-10-CM | POA: Diagnosis not present

## 2019-09-02 DIAGNOSIS — I361 Nonrheumatic tricuspid (valve) insufficiency: Secondary | ICD-10-CM | POA: Diagnosis not present

## 2019-09-02 DIAGNOSIS — I340 Nonrheumatic mitral (valve) insufficiency: Secondary | ICD-10-CM | POA: Diagnosis not present

## 2019-09-02 HISTORY — DX: Unspecified atrial fibrillation: I48.91

## 2019-09-02 LAB — CBC
HCT: 35.1 % — ABNORMAL LOW (ref 36.0–46.0)
Hemoglobin: 11 g/dL — ABNORMAL LOW (ref 12.0–15.0)
MCH: 30.6 pg (ref 26.0–34.0)
MCHC: 31.3 g/dL (ref 30.0–36.0)
MCV: 97.8 fL (ref 80.0–100.0)
Platelets: 224 10*3/uL (ref 150–400)
RBC: 3.59 MIL/uL — ABNORMAL LOW (ref 3.87–5.11)
RDW: 13.9 % (ref 11.5–15.5)
WBC: 4.8 10*3/uL (ref 4.0–10.5)
nRBC: 0 % (ref 0.0–0.2)

## 2019-09-02 LAB — TROPONIN I (HIGH SENSITIVITY): Troponin I (High Sensitivity): 3 ng/L (ref <18)

## 2019-09-02 LAB — BASIC METABOLIC PANEL
Anion gap: 13 (ref 5–15)
BUN: 19 mg/dL (ref 8–23)
CO2: 22 mmol/L (ref 22–32)
Calcium: 8.7 mg/dL — ABNORMAL LOW (ref 8.9–10.3)
Chloride: 102 mmol/L (ref 98–111)
Creatinine, Ser: 1.23 mg/dL — ABNORMAL HIGH (ref 0.44–1.00)
GFR calc Af Amer: 49 mL/min — ABNORMAL LOW (ref >60)
GFR calc non Af Amer: 43 mL/min — ABNORMAL LOW (ref >60)
Glucose, Bld: 152 mg/dL — ABNORMAL HIGH (ref 70–99)
Potassium: 4.2 mmol/L (ref 3.5–5.1)
Sodium: 137 mmol/L (ref 135–145)

## 2019-09-02 NOTE — Telephone Encounter (Signed)
Patient called in- stating that she just recently had a cardioversion- but she states that she has been having SOB episodes and she has had increased in weight gain 2-3 lbs over night. She wanted to be seen today or tomorrow but I did not have anything other than virtual clinic- patient did not want to do that, and will go over to ER to be evaluated.  Patient states her HR has been all over the place, and could possibly be back in afib. Patient is going to [facility] now.

## 2019-09-02 NOTE — ED Triage Notes (Signed)
Pt endorses a-fib with shob with weight gain x 1 day. Pt was here last Wednesday and had cardioversion for a-fib. Tachy in triage. BP normal.

## 2019-09-03 ENCOUNTER — Emergency Department (HOSPITAL_COMMUNITY): Payer: Medicare Other

## 2019-09-03 ENCOUNTER — Observation Stay (HOSPITAL_BASED_OUTPATIENT_CLINIC_OR_DEPARTMENT_OTHER): Payer: Medicare Other

## 2019-09-03 DIAGNOSIS — I340 Nonrheumatic mitral (valve) insufficiency: Secondary | ICD-10-CM

## 2019-09-03 DIAGNOSIS — I361 Nonrheumatic tricuspid (valve) insufficiency: Secondary | ICD-10-CM

## 2019-09-03 DIAGNOSIS — E785 Hyperlipidemia, unspecified: Secondary | ICD-10-CM

## 2019-09-03 DIAGNOSIS — I480 Paroxysmal atrial fibrillation: Secondary | ICD-10-CM

## 2019-09-03 DIAGNOSIS — I10 Essential (primary) hypertension: Secondary | ICD-10-CM | POA: Diagnosis not present

## 2019-09-03 DIAGNOSIS — I5043 Acute on chronic combined systolic (congestive) and diastolic (congestive) heart failure: Secondary | ICD-10-CM | POA: Diagnosis not present

## 2019-09-03 LAB — GLUCOSE, CAPILLARY
Glucose-Capillary: 109 mg/dL — ABNORMAL HIGH (ref 70–99)
Glucose-Capillary: 96 mg/dL (ref 70–99)

## 2019-09-03 LAB — PROTIME-INR
INR: 1.6 — ABNORMAL HIGH (ref 0.8–1.2)
Prothrombin Time: 18.5 seconds — ABNORMAL HIGH (ref 11.4–15.2)

## 2019-09-03 LAB — TROPONIN I (HIGH SENSITIVITY): Troponin I (High Sensitivity): 4 ng/L (ref <18)

## 2019-09-03 LAB — ECHOCARDIOGRAM COMPLETE
Height: 62 in
Weight: 3720 oz

## 2019-09-03 LAB — SARS CORONAVIRUS 2 (TAT 6-24 HRS): SARS Coronavirus 2: NEGATIVE

## 2019-09-03 LAB — BRAIN NATRIURETIC PEPTIDE: B Natriuretic Peptide: 269.2 pg/mL — ABNORMAL HIGH (ref 0.0–100.0)

## 2019-09-03 MED ORDER — METOPROLOL SUCCINATE ER 100 MG PO TB24
100.0000 mg | ORAL_TABLET | Freq: Once | ORAL | Status: AC
  Administered 2019-09-03: 100 mg via ORAL
  Filled 2019-09-03: qty 1

## 2019-09-03 MED ORDER — SODIUM CHLORIDE 0.9% FLUSH: 3.0000 mL | INTRAVENOUS | Status: DC | PRN

## 2019-09-03 MED ORDER — SODIUM CHLORIDE 0.9 % IV SOLN: 250.0000 mL | INTRAVENOUS | Status: DC | PRN

## 2019-09-03 MED ORDER — IOHEXOL 350 MG/ML SOLN
100.0000 mL | Freq: Once | INTRAVENOUS | Status: AC | PRN
  Administered 2019-09-03: 100 mL via INTRAVENOUS

## 2019-09-03 MED ORDER — METOPROLOL SUCCINATE ER 100 MG PO TB24
100.0000 mg | ORAL_TABLET | Freq: Two times a day (BID) | ORAL | Status: DC
  Administered 2019-09-03 – 2019-09-07 (×7): 100 mg via ORAL
  Filled 2019-09-03 (×8): qty 1

## 2019-09-03 MED ORDER — ACETAMINOPHEN 325 MG PO TABS
650.0000 mg | ORAL_TABLET | ORAL | Status: DC | PRN
  Administered 2019-09-07 – 2019-09-08 (×2): 650 mg via ORAL
  Filled 2019-09-03 (×2): qty 2

## 2019-09-03 MED ORDER — INSULIN ASPART 100 UNIT/ML ~~LOC~~ SOLN: 0.0000 [IU] | Freq: Every day | SUBCUTANEOUS | Status: DC

## 2019-09-03 MED ORDER — SODIUM CHLORIDE 0.9% FLUSH
3.0000 mL | Freq: Two times a day (BID) | INTRAVENOUS | Status: DC
  Administered 2019-09-03 – 2019-09-04 (×2): 3 mL via INTRAVENOUS

## 2019-09-03 MED ORDER — RIVAROXABAN 20 MG PO TABS
20.0000 mg | ORAL_TABLET | Freq: Once | ORAL | Status: AC
  Administered 2019-09-03: 20 mg via ORAL
  Filled 2019-09-03: qty 1

## 2019-09-03 MED ORDER — FUROSEMIDE 10 MG/ML IJ SOLN
40.0000 mg | Freq: Two times a day (BID) | INTRAMUSCULAR | Status: AC
  Administered 2019-09-03 – 2019-09-06 (×6): 40 mg via INTRAVENOUS
  Filled 2019-09-03 (×6): qty 4

## 2019-09-03 MED ORDER — ONDANSETRON HCL 4 MG/2ML IJ SOLN: 4.0000 mg | Freq: Four times a day (QID) | INTRAMUSCULAR | Status: DC | PRN

## 2019-09-03 MED ORDER — INSULIN ASPART 100 UNIT/ML ~~LOC~~ SOLN
0.0000 [IU] | Freq: Three times a day (TID) | SUBCUTANEOUS | Status: DC
  Administered 2019-09-04 – 2019-09-05 (×3): 1 [IU] via SUBCUTANEOUS
  Administered 2019-09-06 – 2019-09-07 (×2): 2 [IU] via SUBCUTANEOUS
  Administered 2019-09-08: 1 [IU] via SUBCUTANEOUS

## 2019-09-03 MED ORDER — RIVAROXABAN 20 MG PO TABS: 20.0000 mg | ORAL_TABLET | Freq: Every day | ORAL | Status: DC

## 2019-09-03 MED ORDER — ATORVASTATIN CALCIUM 10 MG PO TABS
20.0000 mg | ORAL_TABLET | Freq: Every evening | ORAL | Status: DC
  Administered 2019-09-03 – 2019-09-08 (×6): 20 mg via ORAL
  Filled 2019-09-03 (×6): qty 2

## 2019-09-03 MED ORDER — VITAMIN D 25 MCG (1000 UNIT) PO TABS
5000.0000 [IU] | ORAL_TABLET | Freq: Every day | ORAL | Status: DC
  Administered 2019-09-03 – 2019-09-09 (×7): 5000 [IU] via ORAL
  Filled 2019-09-03 (×7): qty 5

## 2019-09-03 NOTE — ED Notes (Signed)
Has been in waiting room since 1720 yesterday-- unable to speak in complete sentences without coughing-- has hx AFib, was cardioverted on Wednesday--

## 2019-09-03 NOTE — ED Provider Notes (Signed)
[facility] EMERGENCY DEPARTMENT Provider Note   CSN: [id number] Arrival date & time: 09/02/19  1659     History   Chief Complaint Chief Complaint  Patient presents with   Atrial Fibrillation    HPI Carrie Haynes is a 78 y.o. female.     HPI   Carrie Haynes is a 78 y.o. female, with a history of A. fib, DM, hyperlipidemia, HTN, obesity, presenting to the ED with shortness of breath beginning around November 12, worse with exertion. Accompanied by 5 pound weight gain over the past 5 days, mild increase in bilateral lower extremity edema, and onset of dry cough.  Intermittent chest tightness and pressure. Patient underwent cardioversion for A. fib on November 11. She was not able to take her Xarelto or her Toprol-XL last night due to being in the waiting room, but has otherwise been compliant.  Denies fever/chills, abdominal pain, urinary symptoms, persistent chest pain, syncope, N/V/D, or any other complaints.    Past Medical History:  Diagnosis Date   Diabetes mellitus without complication (HCC)    Hyperlipemia    Hypertension     Patient Active Problem List   Diagnosis Date Noted   Upper airway cough syndrome 07/13/2017   Morbid obesity due to excess calories (HCC) 07/13/2017   DCM (dilated cardiomyopathy) (HCC)    Atrial fibrillation with RVR (HCC)    Asystole (HCC)    Hypotension    Acute on chronic combined systolic and diastolic CHF (congestive heart failure) (HCC)    DOE (dyspnea on exertion) 05/12/2017   Atrial fibrillation (HCC) 05/10/2017    Past Surgical History:  Procedure Laterality Date   CARDIOVERSION N/A 05/18/2017   Procedure: CARDIOVERSION;  Surgeon: Wendall Stade, MD;  Location: [facility] ENDOSCOPY;  Service: Cardiovascular;  Laterality: N/A;   CARDIOVERSION N/A 05/23/2017   Procedure: CARDIOVERSION;  Surgeon: Quintella Reichert, MD;  Location: [facility] ENDOSCOPY;  Service: Cardiovascular;  Laterality: N/A;    CARDIOVERSION N/A 08/28/2019   Procedure: CARDIOVERSION;  Surgeon: Jake Bathe, MD;  Location: [facility] ENDOSCOPY;  Service: Cardiovascular;  Laterality: N/A;   RIGHT/LEFT HEART CATH AND CORONARY ANGIOGRAPHY N/A 05/12/2017   Procedure: Right/Left Heart Cath and Coronary Angiography;  Surgeon: Lyn Records, MD;  Location: [facility] INVASIVE CV LAB;  Service: Cardiovascular;  Laterality: N/A;   TEE WITHOUT CARDIOVERSION N/A 05/18/2017   Procedure: TRANSESOPHAGEAL ECHOCARDIOGRAM (TEE);  Surgeon: Wendall Stade, MD;  Location: [facility] ENDOSCOPY;  Service: Cardiovascular;  Laterality: N/A;     OB History   No obstetric history on file.      Home Medications    Prior to Admission medications   Medication Sig Start Date End Date Taking? Authorizing Provider  atorvastatin (LIPITOR) 20 MG tablet Take 20 mg by mouth every evening. 05/03/17  Yes [provider]  Cholecalciferol (VITAMIN D-3) 5000 units TABS Take 5,000 Units by mouth daily.   Yes [provider]  metFORMIN (GLUCOPHAGE-XR) 500 MG 24 hr tablet Take 2,000 mg by mouth at bedtime.   Yes [provider]  metoprolol succinate (TOPROL-XL) 50 MG 24 hr tablet Take 2 tablets (100 mg total) by mouth daily. 08/21/19  Yes Cleaver, Thomasene Ripple, NP  XARELTO 20 MG TABS tablet TAKE 1 TABLET (20 MG TOTAL) BY MOUTH DAILY WITH SUPPER Patient taking differently: Take 20 mg by mouth daily with supper.  07/10/19  Yes Azalee Course, PA    Family History Family History  Problem Relation Age of Onset   Hypertension  Father    Heart attack Sister    Breast cancer Other     Social History Social History   Tobacco Use   Smoking status: Never Smoker   Smokeless tobacco: Never Used  Substance Use Topics   Alcohol use: No   Drug use: No     Allergies   Calcium channel blockers   Review of Systems Review of Systems  Constitutional: Negative for chills, diaphoresis and fever.  Respiratory: Positive for cough and shortness of breath.     Cardiovascular: Positive for palpitations and leg swelling.  Gastrointestinal: Negative for abdominal pain, diarrhea, nausea and vomiting.  Neurological: Negative for syncope.  All other systems reviewed and are negative.    Physical Exam Updated Vital Signs BP (!) 116/91 (BP Location: Left Arm)    Pulse (!) 104    Temp 98.2 F (36.8 C) (Oral)    Resp 17    SpO2 97%   Physical Exam Vitals signs and nursing note reviewed.  Constitutional:      General: She is not in acute distress.    Appearance: She is well-developed. She is not diaphoretic.  HENT:     Head: Normocephalic and atraumatic.     Mouth/Throat:     Mouth: Mucous membranes are moist.     Pharynx: Oropharynx is clear.  Eyes:     Conjunctiva/sclera: Conjunctivae normal.  Neck:     Musculoskeletal: Neck supple.  Cardiovascular:     Rate and Rhythm: Tachycardia present. Rhythm irregularly irregular.     Pulses: Normal pulses.          Radial pulses are 2+ on the right side and 2+ on the left side.       Posterior tibial pulses are 2+ on the right side and 2+ on the left side.     Heart sounds: Normal heart sounds.     Comments: Tactile temperature in the extremities appropriate and equal bilaterally. Manually measured pulse to be about 110. Pulmonary:     Breath sounds: Normal breath sounds.     Comments: Some conversational dyspnea. Abdominal:     Palpations: Abdomen is soft.     Tenderness: There is no abdominal tenderness. There is no guarding.  Musculoskeletal:     Right lower leg: 1+ Pitting Edema present.     Left lower leg: 1+ Pitting Edema present.  Lymphadenopathy:     Cervical: No cervical adenopathy.  Skin:    General: Skin is warm and dry.  Neurological:     Mental Status: She is alert.  Psychiatric:        Mood and Affect: Mood and affect normal.        Speech: Speech normal.        Behavior: Behavior normal.      ED Treatments / Results  Labs (all labs ordered are listed, but only  abnormal results are displayed) Labs Reviewed  BASIC METABOLIC PANEL - Abnormal; Notable for the following components:      Result Value   Glucose, Bld 152 (*)    Creatinine, Ser 1.23 (*)    Calcium 8.7 (*)    GFR calc non Af Amer 43 (*)    GFR calc Af Amer 49 (*)    All other components within normal limits  CBC - Abnormal; Notable for the following components:   RBC 3.59 (*)    Hemoglobin 11.0 (*)    HCT 35.1 (*)    All other components within normal limits  BRAIN  NATRIURETIC PEPTIDE - Abnormal; Notable for the following components:   B Natriuretic Peptide 269.2 (*)    All other components within normal limits  PROTIME-INR - Abnormal; Notable for the following components:   Prothrombin Time 18.5 (*)    INR 1.6 (*)    All other components within normal limits  SARS CORONAVIRUS 2 (TAT 6-24 HRS)  BASIC METABOLIC PANEL  TROPONIN I (HIGH SENSITIVITY)  TROPONIN I (HIGH SENSITIVITY)    EKG EKG Interpretation  Date/Time:  Monday September 02 2019 17:28:49 EST Ventricular Rate:  103 PR Interval:    QRS Duration: 86 QT Interval:  298 QTC Calculation: 390 R Axis:   -60 Text Interpretation: Atrial fibrillation with rapid ventricular response Left axis deviation Low voltage QRS Possible Anterolateral infarct , age undetermined Abnormal ECG When compared with ECG of 08/28/2019, Atrial fibrillation has replaced Sinus rhythm Confirmed by Dione BoozeGlick, David ([id number]) on 09/03/2019 1:38:56 AM    Radiology Dg Chest 2 View  Result Date: 09/02/2019 CLINICAL DATA:  Short of breath and atrial fibrillation. Weight gain. EXAM: CHEST - 2 VIEW COMPARISON:  07/12/2017 FINDINGS: Cardiac enlargement. No pleural effusion or edema. Diffuse reticular interstitial opacities of interstitial lung disease are again noted. No superimposed airspace consolidation. The visualized osseous structures are notable for multi level degenerative disc disease. IMPRESSION: 1. No acute cardiopulmonary abnormalities. 2. Stable  reticular interstitial opacities compatible with interstitial lung disease. Electronically Signed   By: Signa Kellaylor  Stroud M.D.   On: 09/02/2019 18:18   Ct Angio Chest Pe W And/or Wo Contrast  Result Date: 09/03/2019 CLINICAL DATA:  Suspected PE. EXAM: CT ANGIOGRAPHY CHEST WITH CONTRAST TECHNIQUE: Multidetector CT imaging of the chest was performed using the standard protocol during bolus administration of intravenous contrast. Multiplanar CT image reconstructions and MIPs were obtained to evaluate the vascular anatomy. CONTRAST:  75 mL OMNIPAQUE IOHEXOL 350 MG/ML SOLN COMPARISON:  04/18/2019 FINDINGS: Cardiovascular: Heart size remains enlarged. No signs of pericardial effusion. Scattered atherosclerotic changes throughout the thoracic aorta, not well evaluated given bolus timing which is optimized for assessment of pulmonary vessels. No signs of acute pulmonary embolism. Signs of coronary artery calcification. Mediastinum/Nodes: Mild nodal enlargement in left hilum, largest approximately 8 mm. Similar to the prior study. Lungs/Pleura: Patchy ground-glass and basilar atelectasis. No signs of pleural effusion or dense consolidation. Unchanged from prior studies. Airways are patent. Upper Abdomen: Caval engorgement similar to prior study. Reflux of injected contrast into the hepatic venous system and IVC. No acute upper abdominal process. Musculoskeletal: Spinal degenerative change as on prior studies. No acute bone finding or destructive bone process. Review of the MIP images confirms the above findings. IMPRESSION: 1. No pulmonary emboli identified. 2. Stable cardiomegaly. Reflux of injected contrast into hepatic veins likely relates to underlying cardiac dysfunction and or pulmonary arterial hypertension as discussed on prior exams. 3. Underlying interstitial thickening is similar to prior studies. 4. Atherosclerotic changes in the thoracic aorta. Coronary artery calcification. Aortic Atherosclerosis  (ICD10-I70.0). Electronically Signed   By: Donzetta KohutGeoffrey  Wile M.D.   On: 09/03/2019 10:18    Procedures Procedures (including critical care time)  Medications Ordered in ED Medications  metoprolol succinate (TOPROL-XL) 24 hr tablet 100 mg (100 mg Oral Not Given 09/03/19 1621)  furosemide (LASIX) injection 40 mg (40 mg Intravenous Given 09/03/19 1621)  atorvastatin (LIPITOR) tablet 20 mg (has no administration in time range)  rivaroxaban (XARELTO) tablet 20 mg (has no administration in time range)  cholecalciferol (VITAMIN D3) tablet 5,000 Units (has no administration in  time range)  sodium chloride flush (NS) 0.9 % injection 3 mL (has no administration in time range)  sodium chloride flush (NS) 0.9 % injection 3 mL (has no administration in time range)  0.9 %  sodium chloride infusion (has no administration in time range)  acetaminophen (TYLENOL) tablet 650 mg (has no administration in time range)  ondansetron (ZOFRAN) injection 4 mg (has no administration in time range)  insulin aspart (novoLOG) injection 0-9 Units (has no administration in time range)  insulin aspart (novoLOG) injection 0-5 Units (has no administration in time range)  metoprolol succinate (TOPROL-XL) 24 hr tablet 100 mg (100 mg Oral Given 09/03/19 0905)  rivaroxaban (XARELTO) tablet 20 mg (20 mg Oral Given 09/03/19 0928)  iohexol (OMNIPAQUE) 350 MG/ML injection 100 mL (100 mLs Intravenous Contrast Given 09/03/19 1001)     Initial Impression / Assessment and Plan / ED Course  I have reviewed the triage vital signs and the nursing notes.  Pertinent labs & imaging results that were available during my care of the patient were reviewed by me and considered in my medical decision making (see chart for details).  Clinical Course as of Sep 03 1707  Tue Sep 03, 2019  0841 Spoke with [place], cardiology. States they will see her and possibly admit her.   [SJ]  1225 Patient seen by myself as well as PA provider.  Briefly  78 year old female who was cardioverted for A. fib approximately 1 week ago, presenting back to the ED and A. fib with shortness of breath.   On exam she is comfortable appearing.  Her heart rate is between 110 to 130 bpm.  She does have crackles in the lower lung bases.  She describes no shortness of breath.  She is some mild lower extremity edema.  Labs are notable for doubling of her BNP, currently 269.  CT angio of the chest not show signs of pulmonary embolism.  No evidence of pneumonia.  Patient was seen by cardiology who will admit the patient.   [MT]  [place] with Roby Lofts, PA for cardiology.  States they are concerned for possible infection causing patient's symptoms.  They are awaiting the Covid test before proceeding.  No additional treatment at this time.   [SJ]  58 Dr. Acie Fredrickson states they will admit the patient to the cardiology service.   [SJ]    Clinical Course User Index [MT] Wyvonnia Dusky, MD [SJ] Lorayne Bender, PA-C       Patient presents with shortness of breath, peripheral edema, and weight gain over the past several days.  I suspect patient's symptoms may be due to recurrence of her A. fib RVR, however, since the symptoms are worse than patient has experienced in the past, CT PE study was performed. CT PE study without evidence of PE. BNP mildly elevated. Patient admitted via cardiology service.  Findings and plan of care discussed with Octaviano Glow, MD. Dr. Langston Masker personally evaluated and examined this patient.     Vitals:   09/03/19 0202 09/03/19 0458 09/03/19 0815 09/03/19 0823  BP: 110/84 (!) 116/91 127/84   Pulse: (!) 113 (!) 104 (!) 111   Resp: 16 17 (!) 25   Temp: 98 F (36.7 C) 98.2 F (36.8 C)    TempSrc: Oral Oral    SpO2: 96% 97% 98%   Weight:    102.1 kg  Height:    5\' 2"  (1.575 m)     Final Clinical Impressions(s) / ED Diagnoses  Final diagnoses:  Atrial fibrillation with rapid ventricular response [facility](HCC)    ED Discharge  Orders    None       Concepcion LivingJoy, Nisha Dhami C, PA-C 09/03/19 1708    Terald Sleeperrifan, Matthew J, MD 09/04/19 [phone]

## 2019-09-03 NOTE — ED Notes (Signed)
ED TO INPATIENT HANDOFF REPORT  ED Nurse Name and Phone #: Solmon IceAlana  S Name/Age/Gender Carrie Haynes 78 y.o. female Room/Bed: 023C/023C  Code Status   Code Status: Prior  Home/SNF/Other Home Patient oriented to: self, place, time and situation Is this baseline? Yes   Triage Complete: Triage complete  Chief Complaint Afib  Triage Note Pt endorses a-fib with shob with weight gain x 1 day. Pt was here last Wednesday and had cardioversion for a-fib. Tachy in triage. BP normal.    Allergies Allergies  Allergen Reactions  . Calcium Channel Blockers Other (See Comments)    Acute hypotension, patient became brady/asystole < 5 seconds    Level of Care/Admitting Diagnosis ED Disposition    ED Disposition Condition Comment   Admit  [facility] [[id number][facility]  Level of Care: Telemetry Cardiac [103]  Covid Evaluation: Confirmed COVID Negative  Diagnosis: Atrial fibrillation with [facility](HCC) [[id number]]) [[id number]]  Admitting Physician: Sabas SousNAHSER, PHILIP J [[id number]]  Attending Physician: Vesta MixerNAHSER, PHILIP J [[id number]]  PT Class (Do Not Modify): Observation [104]  PT Acc Code (Do Not Modify): Observation [10022]       B Medical/Surgery History Past Medical History:  Diagnosis Date  . Diabetes mellitus without complication (HCC)   . Hyperlipemia   . Hypertension    Past Surgical History:  Procedure Laterality Date  . CARDIOVERSION N/A 05/18/2017   Procedure: CARDIOVERSION;  Surgeon: Wendall StadeNishan, Peter C, MD;  [facility];  Service: Cardiovascular;  Laterality: N/A;  . CARDIOVERSION N/A 05/23/2017   Procedure: CARDIOVERSION;  Surgeon: Quintella Reicherturner, Traci R, MD;  [facility];  Service: Cardiovascular;  Laterality: N/A;  . CARDIOVERSION N/A 08/28/2019   Procedure: CARDIOVERSION;  Surgeon: Jake BatheSkains, Mark C, MD;  [facility];  Service: Cardiovascular;  Laterality: N/A;  . RIGHT/LEFT HEART CATH AND CORONARY ANGIOGRAPHY N/A 05/12/2017   Procedure: Right/Left Heart  Cath and Coronary Angiography;  Surgeon: Lyn RecordsSmith, Henry W, MD;  [facility];  Service: Cardiovascular;  Laterality: N/A;  . TEE WITHOUT CARDIOVERSION N/A 05/18/2017   Procedure: TRANSESOPHAGEAL ECHOCARDIOGRAM (TEE);  Surgeon: Wendall StadeNishan, Peter C, MD;  [facility];  Service: Cardiovascular;  Laterality: N/A;     A IV Location/Drains/Wounds Patient Lines/Drains/Airways Status   Active Line/Drains/Airways    Name:   Placement date:   Placement time:   Site:   Days:   Peripheral IV 09/03/19 Left Antecubital   09/03/19    0804    Antecubital   less than 1          Intake/Output Last 24 hours No intake or output data in the 24 hours ending 09/03/19 1621  Labs/Imaging Results for orders placed or performed during the hospital encounter of 09/02/19 (from the past 48 hour(s))  Basic metabolic panel     Status: Abnormal   Collection Time: 09/02/19  5:30 PM  Result Value Ref Range   Sodium 137 135 - 145 mmol/L   Potassium 4.2 3.5 - 5.1 mmol/L   Chloride 102 98 - 111 mmol/L   CO2 22 22 - 32 mmol/L   Glucose, Bld 152 (H) 70 - 99 mg/dL   BUN 19 8 - 23 mg/dL   Creatinine, Ser 9.141.23 (H) 0.44 - 1.00 mg/dL   Calcium 8.7 (L) 8.9 - 10.3 mg/dL   GFR calc non Af Amer 43 (L) >60 mL/min   GFR calc Af Amer 49 (L) >60 mL/min   Anion gap 13 5 - 15    Comment: Performed at  [facility], [place] [address] [place], [place] [address]  CBC     Status: Abnormal   Collection Time: 09/02/19  5:30 PM  Result Value Ref Range   WBC 4.8 4.0 - 10.5 K/uL   RBC 3.59 (L) 3.87 - 5.11 MIL/uL   Hemoglobin 11.0 (L) 12.0 - 15.0 g/dL   HCT 35.1 (L) 36.0 - 46.0 %   MCV 97.8 80.0 - 100.0 fL   MCH 30.6 26.0 - 34.0 pg   MCHC 31.3 30.0 - 36.0 g/dL   RDW 13.9 11.5 - 15.5 %   Platelets 224 150 - 400 K/uL   nRBC 0.0 0.0 - 0.2 %    Comment: Performed at [facility], [place] [address] [place], [place] [address]  Troponin I (High Sensitivity)     Status: None   Collection Time: 09/02/19  5:30 PM   Result Value Ref Range   Troponin I (High Sensitivity) 3 <18 ng/L    Comment: (NOTE) Elevated high sensitivity troponin I (hsTnI) values and significant  changes across serial measurements may suggest ACS but many other  chronic and acute conditions are known to elevate hsTnI results.  Refer to the "Links" section for chest pain algorithms and additional  guidance. Performed at [facility], [place] [address], [place], [place] [address]   Troponin I (High Sensitivity)     Status: None   Collection Time: 09/03/19 12:45 AM  Result Value Ref Range   Troponin I (High Sensitivity) 4 <18 ng/L    Comment: (NOTE) Elevated high sensitivity troponin I (hsTnI) values and significant  changes across serial measurements may suggest ACS but many other  chronic and acute conditions are known to elevate hsTnI results.  Refer to the "Links" section for chest pain algorithms and additional  guidance. Performed at [facility], [place] [address], [place], [place] [address]   Brain natriuretic peptide     Status: Abnormal   Collection Time: 09/03/19  8:10 AM  Result Value Ref Range   B Natriuretic Peptide 269.2 (H) 0.0 - 100.0 pg/mL    Comment: Performed at [facility] [address], [place], [place] [address]  Protime-INR     Status: Abnormal   Collection Time: 09/03/19  8:10 AM  Result Value Ref Range   Prothrombin Time 18.5 (H) 11.4 - 15.2 seconds   INR 1.6 (H) 0.8 - 1.2    Comment: (NOTE) INR goal varies based on device and disease states. Performed at [facility], [place] [address] [place], [place] [address]   SARS CORONAVIRUS 2 (TAT 6-24 HRS) Nasopharyngeal Nasopharyngeal Swab     Status: None   Collection Time: 09/03/19  8:36 AM   Specimen: Nasopharyngeal Swab  Result Value Ref Range   SARS Coronavirus 2 NEGATIVE NEGATIVE    Comment: (NOTE) SARS-CoV-2 target nucleic acids are NOT DETECTED. The SARS-CoV-2 RNA is generally detectable in upper and  lower respiratory specimens during the acute phase of infection. Negative results do not preclude SARS-CoV-2 infection, do not rule out co-infections with other pathogens, and should not be used as the sole basis for treatment or other patient management decisions. Negative results must be combined with clinical observations, patient history, and epidemiological information. The expected result is Negative. Fact Sheet for Patients: SugarRoll.be Fact Sheet for Healthcare Providers: https://www.woods-mathews.com/ This test is not yet approved or cleared by the [place] FDA and  has been authorized for detection and/or diagnosis of SARS-CoV-2  by FDA under an Emergency Use Authorization (EUA). This EUA will remain  in effect (meaning this test can be used) for the duration of the COVID-19 declaration under Section 56 4(b)(1) of the Act, 21 U.S.C. section 360bbb-3(b)(1), unless the authorization is terminated or revoked sooner. Performed at [facility], [address], [place], [place] [address]    Dg Chest 2 View  Result Date: 09/02/2019 CLINICAL DATA:  Short of breath and atrial fibrillation. Weight gain. EXAM: CHEST - 2 VIEW COMPARISON:  07/12/2017 FINDINGS: Cardiac enlargement. No pleural effusion or edema. Diffuse reticular interstitial opacities of interstitial lung disease are again noted. No superimposed airspace consolidation. The visualized osseous structures are notable for multi level degenerative disc disease. IMPRESSION: 1. No acute cardiopulmonary abnormalities. 2. Stable reticular interstitial opacities compatible with interstitial lung disease. Electronically Signed   By: Signa Kell M.D.   On: 09/02/2019 18:18   Ct Angio Chest Pe W And/or Wo Contrast  Result Date: 09/03/2019 CLINICAL DATA:  Suspected PE. EXAM: CT ANGIOGRAPHY CHEST WITH CONTRAST TECHNIQUE: Multidetector CT imaging of the chest was performed using the  standard protocol during bolus administration of intravenous contrast. Multiplanar CT image reconstructions and MIPs were obtained to evaluate the vascular anatomy. CONTRAST:  75 mL OMNIPAQUE IOHEXOL 350 MG/ML SOLN COMPARISON:  04/18/2019 FINDINGS: Cardiovascular: Heart size remains enlarged. No signs of pericardial effusion. Scattered atherosclerotic changes throughout the thoracic aorta, not well evaluated given bolus timing which is optimized for assessment of pulmonary vessels. No signs of acute pulmonary embolism. Signs of coronary artery calcification. Mediastinum/Nodes: Mild nodal enlargement in left hilum, largest approximately 8 mm. Similar to the prior study. Lungs/Pleura: Patchy ground-glass and basilar atelectasis. No signs of pleural effusion or dense consolidation. Unchanged from prior studies. Airways are patent. Upper Abdomen: Caval engorgement similar to prior study. Reflux of injected contrast into the hepatic venous system and IVC. No acute upper abdominal process. Musculoskeletal: Spinal degenerative change as on prior studies. No acute bone finding or destructive bone process. Review of the MIP images confirms the above findings. IMPRESSION: 1. No pulmonary emboli identified. 2. Stable cardiomegaly. Reflux of injected contrast into hepatic veins likely relates to underlying cardiac dysfunction and or pulmonary arterial hypertension as discussed on prior exams. 3. Underlying interstitial thickening is similar to prior studies. 4. Atherosclerotic changes in the thoracic aorta. Coronary artery calcification. Aortic Atherosclerosis (ICD10-I70.0). Electronically Signed   By: Donzetta Kohut M.D.   On: 09/03/2019 10:18    Pending Labs Unresulted Labs (From admission, onward)    Start     Ordered   09/04/19 1550  Magnesium  Once,   R     09/03/19 1613   Signed and Held  Basic metabolic panel  Daily,   R     Signed and Held          Vitals/Pain Today's Vitals   09/03/19 1430 09/03/19  1500 09/03/19 1530 09/03/19 1600  BP: 106/82 110/78 100/80 123/87  Pulse: (!) 109 (!) 104 96 94  Resp: (!) 21 (!) 21 (!) 27 (!) 25  Temp:      TempSrc:      SpO2: 93% 98% 94% 95%  Weight:      Height:      PainSc:        Isolation Precautions No active isolations  Medications Medications  metoprolol succinate (TOPROL-XL) 24 hr tablet 100 mg (100 mg Oral Not Given 09/03/19 1621)  furosemide (LASIX) injection 40 mg (40 mg Intravenous Given 09/03/19  1621)  atorvastatin (LIPITOR) tablet 20 mg (has no administration in time range)  rivaroxaban (XARELTO) tablet 20 mg (has no administration in time range)  Vitamin D-3 TABS 5,000 Units (has no administration in time range)  acetaminophen (TYLENOL) tablet 650 mg (has no administration in time range)  insulin aspart (novoLOG) injection 0-9 Units (has no administration in time range)  insulin aspart (novoLOG) injection 0-5 Units (has no administration in time range)  metoprolol succinate (TOPROL-XL) 24 hr tablet 100 mg (100 mg Oral Given 09/03/19 0905)  rivaroxaban (XARELTO) tablet 20 mg (20 mg Oral Given 09/03/19 0928)  iohexol (OMNIPAQUE) 350 MG/ML injection 100 mL (100 mLs Intravenous Contrast Given 09/03/19 1001)    Mobility walks Low fall risk   Focused Assessments Cardiac Assessment Handoff:    Lab Results  Component Value Date   TROPONINI <0.03 05/11/2017   No results found for: DDIMER Does the Patient currently have chest pain? No     R Recommendations: See Admitting Provider Note  Report given to:   Additional Notes:

## 2019-09-03 NOTE — ED Notes (Signed)
Report given to Primitivo Gauze, RN on [facility]

## 2019-09-03 NOTE — Progress Notes (Signed)
  Echocardiogram 2D Echocardiogram has been performed.  Dava Rensch A Kassem Kibbe 09/03/2019, 5:45 PM

## 2019-09-03 NOTE — ED Notes (Signed)
Pt is A-fib on monitor 

## 2019-09-03 NOTE — H&P (Addendum)
Cardiology Admission History and Physical:   Patient ID: JAEDEN WESTBAY MRN: [id number]; DOB: August 11, 1941   Admission date: 09/02/2019  Primary Care Provider: Harlan Stains, MD Primary Cardiologist: Sanda Klein, MD  Primary Electrophysiologist:  None   Chief Complaint:  SOB and weight gain  Patient Profile:   DARLIS WRAGG is a 78 y.o. 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    History of Present Illness:   Ms. Lengyel was recently found to be in atrial fibrillation with RVR at an outpatient cardiology visit with Coletta Memos, NP 08/21/2019. She was recommended to undergo DCCV at that time given compliance with xarelto. She had a successful DCCV 08/28/2019. Upon returning home, she had increased SOB, weight gain, and LE edema over the past 5 days. She reports orthopnea. She is historically unaware of her atrial fibrillation and denies recent palpitations. Her DOE has been persistent for months but progressively worsened in recent days. No complaints of chest pain, fevers, or sick contacts. She does report eating meals from restaurants multiple times per week.   In regards to her atrial fibrillation, she was diagnosed in 2018. LHC at that time revealed normal coronaries. She underwent DCCV which was unsuccessful despite 4 shocks. She was subsequently loaded on amiodarone and had a successful DCCV. Unfortunately subsequent PFTs showed restrictive disease and her amiodarone was discontinued. She had mild to moderate pulmonary fibrosis on CT chest 04/2019.   ED course: tachycardic to the 120s, BP overall stable (mildly elevated DBP), tachypneic, otherwise VSS. Labs notable for electrolytes wnl, Cr 1.23, Hgb 11.0, PLT 224, HsTrop 3>4, BNP 269, INR 1.6.COVID 19 negative. EKG atrial fibrillation with RVR, rate  103, low voltage, no STE/D. CXR without acute findings; stable interstitial lung disease. CTA chest without PE, stable cardiomegaly, reflux of contrast into hepatic veins likely related to underlying cardiac dysfunction vs pulmHTN, as well as thoracic aorta atherosclerosis and coronary artery calcifications. She was given home metoprolol and xarelto in the ED this morning after missing PM dosing. Cardiology asked to evaluate.    Heart Pathway Score:     Past Medical History:  Diagnosis Date   Diabetes mellitus without complication ([place])    Hyperlipemia    Hypertension     Past Surgical History:  Procedure Laterality Date   CARDIOVERSION N/A 05/18/2017   Procedure: CARDIOVERSION;  Surgeon: Josue Hector, MD;  Location: [facility] ENDOSCOPY;  Service: Cardiovascular;  Laterality: N/A;   CARDIOVERSION N/A 05/23/2017   Procedure: CARDIOVERSION;  Surgeon: Sueanne Margarita, MD;  Location: [facility] ENDOSCOPY;  Service: Cardiovascular;  Laterality: N/A;   CARDIOVERSION N/A 08/28/2019   Procedure: CARDIOVERSION;  Surgeon: Jerline Pain, MD;  Location: [facility] ENDOSCOPY;  Service: Cardiovascular;  Laterality: N/A;   RIGHT/LEFT HEART CATH AND CORONARY ANGIOGRAPHY N/A 05/12/2017   Procedure: Right/Left Heart Cath and Coronary Angiography;  Surgeon: Belva Crome, MD;  Location: [facility] CV LAB;  Service: Cardiovascular;  Laterality: N/A;   TEE WITHOUT CARDIOVERSION N/A 05/18/2017   Procedure: TRANSESOPHAGEAL ECHOCARDIOGRAM (TEE);  Surgeon: Josue Hector, MD;  Location: [facility] ENDOSCOPY;  Service: Cardiovascular;  Laterality: N/A;     Medications Prior to Admission: Prior to Admission medications   Medication Sig Start Date End Date Taking? Authorizing Provider  atorvastatin (LIPITOR) 20 MG tablet Take 20 mg by mouth every evening. 05/03/17  [provider]  Cholecalciferol (VITAMIN D-3) 5000 units TABS Take 5,000 Units by mouth daily.    [provider]  metFORMIN (GLUCOPHAGE-XR) 500 MG 24 hr  tablet Take 2,000 mg by mouth at bedtime.    [provider]  metoprolol succinate (TOPROL-XL) 50 MG 24 hr tablet Take 2 tablets (100 mg total) by mouth daily. 08/21/19   Cleaver, Thomasene RippleJesse M, NP  XARELTO 20 MG TABS tablet TAKE 1 TABLET (20 MG TOTAL) BY MOUTH DAILY WITH SUPPER Patient taking differently: Take 20 mg by mouth daily with supper.  07/10/19   Azalee CourseMeng, Hao, PA     Allergies:    Allergies  Allergen Reactions   Calcium Channel Blockers Other (See Comments)    Acute hypotension, patient became brady/asystole < 5 seconds    Social History:   Social History   Socioeconomic History   Marital status: Widowed    Spouse name: Not on file   Number of children: Not on file   Years of education: Not on file   Highest education level: Not on file  Occupational History   Not on file  Social Needs   Financial resource strain: Not on file   Food insecurity    Worry: Not on file    Inability: Not on file   Transportation needs    Medical: Not on file    Non-medical: Not on file  Tobacco Use   Smoking status: Never Smoker   Smokeless tobacco: Never Used  Substance and Sexual Activity   Alcohol use: No   Drug use: No   Sexual activity: Not on file  Lifestyle   Physical activity    Days per week: Not on file    Minutes per session: Not on file   Stress: Not on file  Relationships   Social connections    Talks on phone: Not on file    Gets together: Not on file    Attends religious service: Not on file    Active member of club or organization: Not on file    Attends meetings of clubs or organizations: Not on file    Relationship status: Not on file   Intimate partner violence    Fear of current or ex partner: Not on file    Emotionally abused: Not on file    Physically abused: Not on file    Forced sexual activity: Not on file  Other Topics Concern   Not on file  Social History Narrative   Not on file    Family History:   The patient's family  history includes Breast cancer in an other family member; Heart attack in her sister; Hypertension in her father.    ROS:  Please see the history of present illness.  All other ROS reviewed and negative.     Physical Exam/Data:   Vitals:   09/03/19 0900 09/03/19 0915 09/03/19 0930 09/03/19 1037  BP: (!) 135/94 130/87 118/77 (!) 109/92  Pulse: (!) 133 (!) 116 (!) 123 (!) 126  Resp: (!) 28 (!) 24 (!) 27 (!) 25  Temp:      TempSrc:      SpO2: 96% 96% 95% 95%  Weight:      Height:       No intake or output data in the 24 hours ending 09/03/19 1046 Last 3 Weights 09/03/2019 08/28/2019 08/21/2019  Weight (lbs) 225 lb 225 lb 228 lb 9.6 oz  Weight (kg) 102.059 kg 102.059 kg 103.692 kg  Body mass index is 41.15 kg/m.  General:  Obese female sitting upright in bed in no acute distress HEENT: sclera anicteric Neck: no JVD Vascular: No carotid bruits; distal pulses 2+ bilaterally  Cardiac:  normal S1, S2; IRIR; no murmurs, rubs, or gallops Lungs:  Crackles at lung bases, no wheezing/rhonchi Abd: soft, obese, nontender, no hepatomegaly  Ext: trace LE edema, varicose veins Musculoskeletal:  No deformities, BUE and BLE strength normal and equal Skin: warm and dry  Neuro:  CNs 2-12 intact, no focal abnormalities noted Psych:  Normal affect    EKG:  personally reviewed and demonstrates atrial fibrillation with RVR, rate 103, low voltage, no STE/D.  Relevant CV Studies: Right/Left heart catheterization 04/2017:  Right dominant coronary anatomy.  Normal coronary arteries.  Estimated left ventricular ejection fraction 35-40%, global hypokinesis, with elevated filling pressures consistent with acute on chronic combined systolic and diastolic heart failure.   RECOMMENDATIONS:   Consideration of tachycardia related left ventricular systolic dysfunction should be given.  Rate control and possible rhythm control of atrial fibrillation per primary team.  Echocardiogram  08/2017: Study Conclusions  - Left ventricle: Systolic function was normal. The estimated   ejection fraction was in the range of 55% to 60%. Doppler   parameters are consistent with both elevated ventricular   end-diastolic filling pressure and elevated left atrial filling   pressure. - Aortic valve: No CW doppler done note indicated limited echo with   definity for EF. - Left atrium: The atrium was moderately dilated.  Laboratory Data:  High Sensitivity Troponin:   Recent Labs  Lab 09/02/19 1730 09/03/19 0045  TROPONINIHS 3 4      Chemistry Recent Labs  Lab 09/02/19 1730  NA 137  K 4.2  CL 102  CO2 22  GLUCOSE 152*  BUN 19  CREATININE 1.23*  CALCIUM 8.7*  GFRNONAA 43*  GFRAA 49*  ANIONGAP 13    No results for input(s): PROT, ALBUMIN, AST, ALT, ALKPHOS, BILITOT in the last 168 hours. Hematology Recent Labs  Lab 09/02/19 1730  WBC 4.8  RBC 3.59*  HGB 11.0*  HCT 35.1*  MCV 97.8  MCH 30.6  MCHC 31.3  RDW 13.9  PLT 224   BNP Recent Labs  Lab 09/03/19 0810  BNP 269.2*    DDimer No results for input(s): DDIMER in the last 168 hours.   Radiology/Studies:  Dg Chest 2 View  Result Date: 09/02/2019 CLINICAL DATA:  Short of breath and atrial fibrillation. Weight gain. EXAM: CHEST - 2 VIEW COMPARISON:  07/12/2017 FINDINGS: Cardiac enlargement. No pleural effusion or edema. Diffuse reticular interstitial opacities of interstitial lung disease are again noted. No superimposed airspace consolidation. The visualized osseous structures are notable for multi level degenerative disc disease. IMPRESSION: 1. No acute cardiopulmonary abnormalities. 2. Stable reticular interstitial opacities compatible with interstitial lung disease. Electronically Signed   By: Signa Kellaylor  Stroud M.D.   On: 09/02/2019 18:18   Ct Angio Chest Pe W And/or Wo Contrast  Result Date: 09/03/2019 CLINICAL DATA:  Suspected PE. EXAM: CT ANGIOGRAPHY CHEST WITH CONTRAST TECHNIQUE: Multidetector CT  imaging of the chest was performed using the standard protocol during bolus administration of intravenous contrast. Multiplanar CT image reconstructions and MIPs were obtained to evaluate the vascular anatomy. CONTRAST:  75 mL OMNIPAQUE IOHEXOL 350 MG/ML SOLN COMPARISON:  04/18/2019 FINDINGS: Cardiovascular: Heart size remains enlarged. No signs of pericardial effusion. Scattered atherosclerotic changes throughout the thoracic aorta, not well evaluated given bolus timing which is optimized for assessment of  pulmonary vessels. No signs of acute pulmonary embolism. Signs of coronary artery calcification. Mediastinum/Nodes: Mild nodal enlargement in left hilum, largest approximately 8 mm. Similar to the prior study. Lungs/Pleura: Patchy ground-glass and basilar atelectasis. No signs of pleural effusion or dense consolidation. Unchanged from prior studies. Airways are patent. Upper Abdomen: Caval engorgement similar to prior study. Reflux of injected contrast into the hepatic venous system and IVC. No acute upper abdominal process. Musculoskeletal: Spinal degenerative change as on prior studies. No acute bone finding or destructive bone process. Review of the MIP images confirms the above findings. IMPRESSION: 1. No pulmonary emboli identified. 2. Stable cardiomegaly. Reflux of injected contrast into hepatic veins likely relates to underlying cardiac dysfunction and or pulmonary arterial hypertension as discussed on prior exams. 3. Underlying interstitial thickening is similar to prior studies. 4. Atherosclerotic changes in the thoracic aorta. Coronary artery calcification. Aortic Atherosclerosis (ICD10-I70.0). Electronically Signed   By: Donzetta Kohut M.D.   On: 09/03/2019 10:18    Assessment and Plan:   1. Paroxysmal atrial fibrillation with RVR: recurrent atrial fibrillation despite recent DCCV 08/28/2019. Rates generally in the 100s-120s on home metoprolol  - Consider EP consult for antiarrhythmic options -  unable to use amiodarone due to pulmonary toxicity - Continue xarelto - dose was 12 hours late due to not receiving meds in the ED on the evening of 09/02/2019 - Continue metoprolol succinate - will increase dose to 100mg  BID for improved rate control  2. Acute on chronic combined CHF: patient has had DOE, weight gain of 5lbs, and LE edema over the past 5 days. EF was down to 45-50 at the time of Afib diagnosis in 2018 with recovery of LV function on subsequent echo 08/2017 with EF 55-60%. BNP elevated to 269. No overt edema on CXR or CT Chest.  - Will repeat an echocardiogram to assess LV function given prior reduced EF with afib in the past. - Will diurese with IV lasix 40mg  BID - Continue metoprolol succinate - Monitor strict I&Os and daily weights - Monitor electrolytes and replete to maintain K>4, Mg >2  3. HTN: BP generally stable - Continue metoprolol succinate  4. HLD: No recent lipids - LDL 62 in 2018 - Continue statin  Severity of Illness: The appropriate patient status for this patient is OBSERVATION. Observation status is judged to be reasonable and necessary in order to provide the required intensity of service to ensure the patient's safety. The patient's presenting symptoms, physical exam findings, and initial radiographic and laboratory data in the context of their medical condition is felt to place them at decreased risk for further clinical deterioration. Furthermore, it is anticipated that the patient will be medically stable for discharge from the hospital within 2 midnights of admission. The following factors support the patient status of observation.   " The patient's presenting symptoms include SOB, weight gain, LE edema. " The physical exam findings include crackles at lung bases, cardiac exam with IRIR. " The initial radiographic and laboratory data are mildly elevated BNP, atrial fibrillation with RVR on EKG.     For questions or updates, please contact [facility] Please consult [email or web address] for contact info under        Signed, Beatriz Stallion, PA-C  09/03/2019 10:46 AM    Attending Note:   The patient was seen and examined.  Agree with assessment and plan as noted above.  Changes made to the above note as needed.  Patient seen and independently examined with [place]  Kroeger, PA .   We discussed all aspects of the encounter. I agree with the assessment and plan as stated above.  1.  CHF.   Appears to have acute on chronic CHF.   + wt gain,  Dyspnea, orhtopnea,  Leg edema .  Still eats some salty foods Will start IV lasix BID  CXR shows no clear cut infiltrate  but CT shows some apical infiltrates.  2.  Atrial fib:  Has been very difficult to control.  Several cardioversions in the past several months .  And now has failed amiodarone.   Will ask EP to see to consider tikyson.  3.  Hyperlipidemia:  Last lipids look good   I have spent a total of 40 minutes with patient reviewing hospital  notes , telemetry, EKGs, labs and examining patient as well as establishing an assessment and plan that was discussed with the patient. > 50% of time was spent in direct patient care.    Vesta Mixer, Montez Hageman., MD, [facility] 09/03/2019, 9:22 PM [address],  [address] Office [phone] Pager [phone]

## 2019-09-03 NOTE — Progress Notes (Signed)
Patient admitted to 5C-07. Alert and oriented x 4. Toileted upon arrival. Extremely DOE. Standing weight obtained. Placed on telemetry box #9. Denies any complaints at this time. Will cont to monitor.

## 2019-09-04 ENCOUNTER — Encounter (HOSPITAL_COMMUNITY): Payer: Self-pay | Admitting: *Deleted

## 2019-09-04 DIAGNOSIS — Z6841 Body Mass Index (BMI) 40.0 and over, adult: Secondary | ICD-10-CM | POA: Diagnosis not present

## 2019-09-04 DIAGNOSIS — I4819 Other persistent atrial fibrillation: Secondary | ICD-10-CM | POA: Diagnosis present

## 2019-09-04 DIAGNOSIS — E119 Type 2 diabetes mellitus without complications: Secondary | ICD-10-CM | POA: Diagnosis present

## 2019-09-04 DIAGNOSIS — I428 Other cardiomyopathies: Secondary | ICD-10-CM | POA: Diagnosis present

## 2019-09-04 DIAGNOSIS — Z86718 Personal history of other venous thrombosis and embolism: Secondary | ICD-10-CM | POA: Diagnosis not present

## 2019-09-04 DIAGNOSIS — I071 Rheumatic tricuspid insufficiency: Secondary | ICD-10-CM | POA: Diagnosis present

## 2019-09-04 DIAGNOSIS — Z8249 Family history of ischemic heart disease and other diseases of the circulatory system: Secondary | ICD-10-CM | POA: Diagnosis not present

## 2019-09-04 DIAGNOSIS — I5042 Chronic combined systolic (congestive) and diastolic (congestive) heart failure: Secondary | ICD-10-CM | POA: Diagnosis not present

## 2019-09-04 DIAGNOSIS — Z7984 Long term (current) use of oral hypoglycemic drugs: Secondary | ICD-10-CM | POA: Diagnosis not present

## 2019-09-04 DIAGNOSIS — I4891 Unspecified atrial fibrillation: Secondary | ICD-10-CM | POA: Diagnosis present

## 2019-09-04 DIAGNOSIS — I2510 Atherosclerotic heart disease of native coronary artery without angina pectoris: Secondary | ICD-10-CM | POA: Diagnosis present

## 2019-09-04 DIAGNOSIS — Z79899 Other long term (current) drug therapy: Secondary | ICD-10-CM | POA: Diagnosis not present

## 2019-09-04 DIAGNOSIS — I700 Atherosclerosis of aorta: Secondary | ICD-10-CM | POA: Diagnosis present

## 2019-09-04 DIAGNOSIS — Z7901 Long term (current) use of anticoagulants: Secondary | ICD-10-CM | POA: Diagnosis not present

## 2019-09-04 DIAGNOSIS — I480 Paroxysmal atrial fibrillation: Secondary | ICD-10-CM | POA: Diagnosis not present

## 2019-09-04 DIAGNOSIS — Z20828 Contact with and (suspected) exposure to other viral communicable diseases: Secondary | ICD-10-CM | POA: Diagnosis present

## 2019-09-04 DIAGNOSIS — E785 Hyperlipidemia, unspecified: Secondary | ICD-10-CM | POA: Diagnosis present

## 2019-09-04 DIAGNOSIS — I5043 Acute on chronic combined systolic (congestive) and diastolic (congestive) heart failure: Secondary | ICD-10-CM | POA: Diagnosis present

## 2019-09-04 DIAGNOSIS — I110 Hypertensive heart disease with heart failure: Secondary | ICD-10-CM | POA: Diagnosis present

## 2019-09-04 LAB — MAGNESIUM
Magnesium: 1.3 mg/dL — ABNORMAL LOW (ref 1.7–2.4)
Magnesium: 2.5 mg/dL — ABNORMAL HIGH (ref 1.7–2.4)

## 2019-09-04 LAB — BASIC METABOLIC PANEL
Anion gap: 12 (ref 5–15)
BUN: 19 mg/dL (ref 8–23)
CO2: 24 mmol/L (ref 22–32)
Calcium: 8.8 mg/dL — ABNORMAL LOW (ref 8.9–10.3)
Chloride: 100 mmol/L (ref 98–111)
Creatinine, Ser: 0.95 mg/dL (ref 0.44–1.00)
GFR calc Af Amer: >60 mL/min (ref >60)
GFR calc non Af Amer: 58 mL/min — ABNORMAL LOW (ref >60)
Glucose, Bld: 118 mg/dL — ABNORMAL HIGH (ref 70–99)
Potassium: 4.4 mmol/L (ref 3.5–5.1)
Sodium: 136 mmol/L (ref 135–145)

## 2019-09-04 LAB — GLUCOSE, CAPILLARY
Glucose-Capillary: 95 mg/dL (ref 70–99)
Glucose-Capillary: 111 mg/dL — ABNORMAL HIGH (ref 70–99)
Glucose-Capillary: 125 mg/dL — ABNORMAL HIGH (ref 70–99)
Glucose-Capillary: 104 mg/dL — ABNORMAL HIGH (ref 70–99)

## 2019-09-04 LAB — MRSA PCR SCREENING: MRSA by PCR: NEGATIVE

## 2019-09-04 MED ORDER — RIVAROXABAN 20 MG PO TABS
20.0000 mg | ORAL_TABLET | Freq: Every day | ORAL | Status: DC
  Administered 2019-09-04 – 2019-09-08 (×5): 20 mg via ORAL
  Filled 2019-09-04 (×5): qty 1

## 2019-09-04 MED ORDER — MAGNESIUM SULFATE 4 GM/100ML IV SOLN
4.0000 g | INTRAVENOUS | Status: AC
  Administered 2019-09-04: 4 g via INTRAVENOUS
  Filled 2019-09-04: qty 100

## 2019-09-04 MED ORDER — SODIUM CHLORIDE 0.9% FLUSH
3.0000 mL | Freq: Two times a day (BID) | INTRAVENOUS | Status: DC
  Administered 2019-09-04 – 2019-09-09 (×11): 3 mL via INTRAVENOUS

## 2019-09-04 MED ORDER — SODIUM CHLORIDE 0.9 % IV SOLN: 250.0000 mL | INTRAVENOUS | Status: DC | PRN

## 2019-09-04 MED ORDER — DOFETILIDE 500 MCG PO CAPS
500.0000 ug | ORAL_CAPSULE | Freq: Two times a day (BID) | ORAL | Status: DC
  Administered 2019-09-04 – 2019-09-09 (×10): 500 ug via ORAL
  Filled 2019-09-04 (×11): qty 1

## 2019-09-04 MED ORDER — GUAIFENESIN-DM 100-10 MG/5ML PO SYRP
5.0000 mL | ORAL_SOLUTION | ORAL | Status: DC | PRN
  Administered 2019-09-04 – 2019-09-08 (×6): 5 mL via ORAL
  Filled 2019-09-04 (×6): qty 5

## 2019-09-04 MED ORDER — SODIUM CHLORIDE 0.9% FLUSH
3.0000 mL | INTRAVENOUS | Status: DC | PRN
  Administered 2019-09-04: 3 mL via INTRAVENOUS
  Filled 2019-09-04: qty 3

## 2019-09-04 NOTE — Consult Note (Addendum)
ELECTROPHYSIOLOGY CONSULT NOTE    Patient ID: Carrie Haynes MRN: [id number], DOB/AGE: September 08, 1941 78 y.o.  Admit date: 09/02/2019 Date of Consult: 09/04/2019  Primary Physician: Laurann Montana, MD Primary Cardiologist: Thurmon Fair, MD  Electrophysiologist: New to [facility]  Referring Provider: Dr. [name]  Patient Profile: Carrie Haynes is a 78 y.o. 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  HPI:  Carrie Haynes is a 78 y.o. female who was found to be in AF with RVR at outpatient [facility] visit on 08/21/2019. She underwent successful DCCV 08/28/2019. After she returned home, she had increased SOB, weight gain, and edema for about a week.    AF initially diagnosed in 2018. LHC at that time with normal cors. Had unsuccessful DCCV despite 4 shocks. Then she was loaded on amiodarone and had successful DCCV. PFTs showed restrictive disease and then her amiodarone was discontinued. She had mild/mod pulmonary fibrosis on Chest CT 04/2019.  She presented to the ED 09/02/2019 pm with above symptoms. Pertinent labs on admission include electrolytes wnl, Cr 1.23, Hgb 11.0, PLT 224, HsTrop 3>4, BNP 269, INR 1.6.COVID 19 negative. EKG showed Afib with RVR at 103 bpm. CXR without acute findings. CTA chest without PE.   She missed her PM dose of Xarelto 11/16 due to being in the ED  AFIB history Diagnosed 04/2017, EF 45-50% at that time. Normal cors 05/12/2017 TEE/DCCV 05/2017 failed despite 4 shocks. On diltiazem. 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 Toprol titrated. Echo 09/11/2017 LVEF 55-60% Echo 09/03/2019 LVEF 45-50%, likely in setting of recurrent AF.   She remains SOB with walking around the room at this time. Mild  orthopnea. She is frustrated about being back in AF. Has frequent coughing with her volume overload. COVID negative. Afebrile.   Past Medical History:  Diagnosis Date   Diabetes mellitus without complication (HCC)    Hyperlipemia    Hypertension      Surgical History:  Past Surgical History:  Procedure Laterality Date   CARDIOVERSION N/A 05/18/2017   Procedure: CARDIOVERSION;  Surgeon: Wendall Stade, MD;  Location: [facility] ENDOSCOPY;  Service: Cardiovascular;  Laterality: N/A;   CARDIOVERSION N/A 05/23/2017   Procedure: CARDIOVERSION;  Surgeon: Quintella Reichert, MD;  Location: [facility] ENDOSCOPY;  Service: Cardiovascular;  Laterality: N/A;   CARDIOVERSION N/A 08/28/2019   Procedure: CARDIOVERSION;  Surgeon: Jake Bathe, MD;  Location: [facility] ENDOSCOPY;  Service: Cardiovascular;  Laterality: N/A;   RIGHT/LEFT HEART CATH AND CORONARY ANGIOGRAPHY N/A 05/12/2017   Procedure: Right/Left Heart Cath and Coronary Angiography;  Surgeon: Lyn Records, MD;  Location: [facility] INVASIVE CV LAB;  Service: Cardiovascular;  Laterality: N/A;   TEE WITHOUT CARDIOVERSION N/A 05/18/2017   Procedure: TRANSESOPHAGEAL ECHOCARDIOGRAM (TEE);  Surgeon: Wendall Stade, MD;  Location: [facility] ENDOSCOPY;  Service: Cardiovascular;  Laterality: N/A;     Medications Prior to Admission  Medication Sig Dispense Refill Last Dose   atorvastatin (LIPITOR) 20 MG tablet Take 20 mg by mouth every evening.  3 Past Week at Unknown time   Cholecalciferol (VITAMIN D-3) 5000 units TABS Take 5,000 Units by mouth daily.   Past Week at Unknown time   metFORMIN (GLUCOPHAGE-XR) 500 MG 24 hr tablet Take 2,000 mg by mouth at bedtime.   Past Week  at Unknown time   metoprolol succinate (TOPROL-XL) 50 MG 24 hr tablet Take 2 tablets (100 mg total) by mouth daily. 45 tablet 6 09/01/2019 at 1800   XARELTO 20 MG TABS tablet TAKE 1 TABLET (20 MG TOTAL) BY MOUTH DAILY WITH SUPPER (Patient taking differently: Take 20 mg by mouth daily with supper. ) 90 tablet  1 09/01/2019 at 1800   Inpatient Medications:   atorvastatin  20 mg Oral QPM   cholecalciferol  5,000 Units Oral Daily   furosemide  40 mg Intravenous BID   insulin aspart  0-5 Units Subcutaneous QHS   insulin aspart  0-9 Units Subcutaneous TID WC   metoprolol succinate  100 mg Oral BID   rivaroxaban  20 mg Oral Q supper   sodium chloride flush  3 mL Intravenous Q12H    Allergies:  Allergies  Allergen Reactions   Calcium Channel Blockers Other (See Comments)    Acute hypotension, patient became brady/asystole < 5 seconds    Social History   Socioeconomic History   Marital status: Widowed    Spouse name: Not on file   Number of children: Not on file   Years of education: Not on file   Highest education level: Not on file  Occupational History   Not on file  Social Needs   Financial resource strain: Not on file   Food insecurity    Worry: Not on file    Inability: Not on file   Transportation needs    Medical: Not on file    Non-medical: Not on file  Tobacco Use   Smoking status: Never Smoker   Smokeless tobacco: Never Used  Substance and Sexual Activity   Alcohol use: No   Drug use: No   Sexual activity: Not on file  Lifestyle   Physical activity    Days per week: Not on file    Minutes per session: Not on file   Stress: Not on file  Relationships   Social connections    Talks on phone: Not on file    Gets together: Not on file    Attends religious service: Not on file    Active member of club or organization: Not on file    Attends meetings of clubs or organizations: Not on file    Relationship status: Not on file   Intimate partner violence    Fear of current or ex partner: Not on file    Emotionally abused: Not on file    Physically abused: Not on file    Forced sexual activity: Not on file  Other Topics Concern   Not on file  Social History Narrative   Not on file     Family History  Problem Relation Age of Onset    Hypertension Father    Heart attack Sister    Breast cancer Other      Review of Systems: All other systems reviewed and are otherwise negative except as noted above.  Physical Exam: Vitals:   09/03/19 1648 09/03/19 1711 09/03/19 2128 09/04/19 0500  BP:  119/73 110/75 106/65  Pulse:  (!) 108 96 86  Resp:  19 20 20   Temp:  97.7 F (36.5 C) 98.4 F (36.9 C) 98.1 F (36.7 C)  TempSrc:  Oral Oral Oral  SpO2:  96% 100% 97%  Weight: 105.5 kg   102.9 kg  Height: 5\' 2"  (1.575 m)       GEN- The patient is well appearing, alert and oriented x 3  today.   HEENT: normocephalic, atraumatic; sclera clear, conjunctiva pink; hearing intact; oropharynx clear; neck supple Lungs- Clear to ausculation bilaterally, normal work of breathing.  No wheezes, rales, rhonchi Heart- Regular rate and rhythm, no murmurs, rubs or gallops GI- soft, non-tender, non-distended, bowel sounds present Extremities- no clubbing, cyanosis, or edema; DP/PT/radial pulses 2+ bilaterally MS- no significant deformity or atrophy Skin- warm and dry, no rash or lesion Psych- euthymic mood, full affect Neuro- strength and sensation are intact  Labs:   Lab Results  Component Value Date   WBC 4.8 09/02/2019   HGB 11.0 (L) 09/02/2019   HCT 35.1 (L) 09/02/2019   MCV 97.8 09/02/2019   PLT 224 09/02/2019    Recent Labs  Lab 09/04/19 0441  NA 136  K 4.4  CL 100  CO2 24  BUN 19  CREATININE 0.95  CALCIUM 8.8*  GLUCOSE 118*      Radiology/Studies: Dg Chest 2 View  Result Date: 09/02/2019 CLINICAL DATA:  Short of breath and atrial fibrillation. Weight gain. EXAM: CHEST - 2 VIEW COMPARISON:  07/12/2017 FINDINGS: Cardiac enlargement. No pleural effusion or edema. Diffuse reticular interstitial opacities of interstitial lung disease are again noted. No superimposed airspace consolidation. The visualized osseous structures are notable for multi level degenerative disc disease. IMPRESSION: 1. No acute cardiopulmonary  abnormalities. 2. Stable reticular interstitial opacities compatible with interstitial lung disease. Electronically Signed   By: Signa Kell M.D.   On: 09/02/2019 18:18   Ct Angio Chest Pe W And/or Wo Contrast  Result Date: 09/03/2019 CLINICAL DATA:  Suspected PE. EXAM: CT ANGIOGRAPHY CHEST WITH CONTRAST TECHNIQUE: Multidetector CT imaging of the chest was performed using the standard protocol during bolus administration of intravenous contrast. Multiplanar CT image reconstructions and MIPs were obtained to evaluate the vascular anatomy. CONTRAST:  75 mL OMNIPAQUE IOHEXOL 350 MG/ML SOLN COMPARISON:  04/18/2019 FINDINGS: Cardiovascular: Heart size remains enlarged. No signs of pericardial effusion. Scattered atherosclerotic changes throughout the thoracic aorta, not well evaluated given bolus timing which is optimized for assessment of pulmonary vessels. No signs of acute pulmonary embolism. Signs of coronary artery calcification. Mediastinum/Nodes: Mild nodal enlargement in left hilum, largest approximately 8 mm. Similar to the prior study. Lungs/Pleura: Patchy ground-glass and basilar atelectasis. No signs of pleural effusion or dense consolidation. Unchanged from prior studies. Airways are patent. Upper Abdomen: Caval engorgement similar to prior study. Reflux of injected contrast into the hepatic venous system and IVC. No acute upper abdominal process. Musculoskeletal: Spinal degenerative change as on prior studies. No acute bone finding or destructive bone process. Review of the MIP images confirms the above findings. IMPRESSION: 1. No pulmonary emboli identified. 2. Stable cardiomegaly. Reflux of injected contrast into hepatic veins likely relates to underlying cardiac dysfunction and or pulmonary arterial hypertension as discussed on prior exams. 3. Underlying interstitial thickening is similar to prior studies. 4. Atherosclerotic changes in the thoracic aorta. Coronary artery calcification. Aortic  Atherosclerosis (ICD10-I70.0). Electronically Signed   By: Donzetta Kohut M.D.   On: 09/03/2019 10:18    EKG:09/02/2019 Afib with RVR at 103 bpm, QRS 86 ms. QTc ~373ms by auto-calc though difficult land marks with low voltage and AF/RVR (personally reviewed)  TELEMETRY: Afib with rates 90-110s (personally reviewed)  Assessment/Plan: 1.  Persistent atrial fibrillation Complicated history of AF as above, dx 2018.  Taken off amio with restrictive PFTs at baseline EF initially recovered in NSR, now slightly depressed at 45-50% with recurrent AF.   Continue Toprol 100 mg BID Continue Xarelto  20 mg daily. Of note, Dose was 12 hours late on 11/16, due to being in the ED. So last doses have been evening of 11/15 -> 11/17 at 0930 - pending today's dose.  Poor long term amio candidate with restrictive PFTs at baseline.  Consider tikosyn. EF borderline, but may be able to consider flecainide. Likely not best choice in setting of acute CHF.  LA size is 4.0 cm. So could potentially be amenable to ablation. Body mass index is 41.49 kg/m.   2. Acute systolic CHF on chronic diastolic CHF Likely in setting of recurrent AF Volume status improving with IV lasix, but remains volume overloaded. Negative 3L. Continue Toprol.  Goal is to restore NSR with previous demonstrated tachy-induced CMP  For questions or updates, please contact [place] Please consult [email or web address] for contact info under Cardiology/STEMI.  Signed, Shirley Friar, PA-C  09/04/2019 9:14 AM   I have seen and examined this patient with Oda Kilts.  Agree with above, note added to reflect my findings.  On exam, irregular, no murmurs, lungs clear.  Patient with a history of atrial fibrillation.  She was admitted to the hospital with recurrence of her atrial fibrillation.  She did have a recent cardioversion, but unfortunately converted back to atrial fibrillation with symptoms of weakness, fatigue, lower extremity  swelling, shortness of breath.  At this point, I do feel that she needs a medication to keep her in rhythm.  She is not a great ablation candidate due to her obesity.  We Crimson Beer plan to start Tikosyn tonight.  She Deyona Soza likely need cardioversion on Friday.  She did get a dose of Xarelto yesterday morning but has not had another dose yet today.  We Christal Lagerstrom give her her dose today for prep for cardioversion.  She is morbidly obese and would likely benefit from sleep study as an outpatient.  Would continue diuresis.  Shawntavia Saunders M. Lorren Rossetti MD 09/04/2019 11:58 AM

## 2019-09-04 NOTE — Progress Notes (Addendum)
Progress Note  Patient Name: Carrie Haynes Date of Encounter: 09/04/2019  Primary Cardiologist: Sanda Klein, MD   Subjective   She reports improving in her breathing when she's ambulating to the bathroom. Overall feeling better but not back to baseline. No complaints of chest pain or SOB.   She remains weak.  Needed help sitting up in bed. Appears to be very deconditioned.   Inpatient Medications    Scheduled Meds: . atorvastatin  20 mg Oral QPM  . cholecalciferol  5,000 Units Oral Daily  . furosemide  40 mg Intravenous BID  . insulin aspart  0-5 Units Subcutaneous QHS  . insulin aspart  0-9 Units Subcutaneous TID WC  . metoprolol succinate  100 mg Oral BID  . rivaroxaban  20 mg Oral Q supper  . sodium chloride flush  3 mL Intravenous Q12H   Continuous Infusions: . sodium chloride     PRN Meds: sodium chloride, acetaminophen, ondansetron (ZOFRAN) IV, sodium chloride flush   Vital Signs    Vitals:   09/03/19 1648 09/03/19 1711 09/03/19 2128 09/04/19 0500  BP:  119/73 110/75 106/65  Pulse:  (!) 108 96 86  Resp:  19 20 20   Temp:  97.7 F (36.5 C) 98.4 F (36.9 C) 98.1 F (36.7 C)  TempSrc:  Oral Oral Oral  SpO2:  96% 100% 97%  Weight: 105.5 kg   102.9 kg  Height: 5\' 2"  (1.575 m)       Intake/Output Summary (Last 24 hours) at 09/04/2019 0818 Last data filed at 09/04/2019 0507 Gross per 24 hour  Intake -  Output 3000 ml  Net -3000 ml   Filed Weights   09/03/19 0823 09/03/19 1648 09/04/19 0500  Weight: 102.1 kg 105.5 kg 102.9 kg    Telemetry    Atrial fibrillation with rates generally in the 90s, low voltage - Personally Reviewed  ECG    No new tracings - Personally Reviewed  Physical Exam   GEN: obese female laying in bed on her side in no acute distress.   Neck: No JVD, no carotid bruits Cardiac: IRIR, no murmurs, rubs, or gallops.  Respiratory: Faint crackles at lung bases, otherwise clear to auscultation bilaterally, no wheezes/ rhonchi  GI: NABS, Soft, nontender, non-distended  MS: trace LE edema; No deformity. Neuro:  Nonfocal, moving all extremities spontaneously Psych: Normal affect   Labs    Chemistry Recent Labs  Lab 09/02/19 1730 09/04/19 0441  NA 137 136  K 4.2 4.4  CL 102 100  CO2 22 24  GLUCOSE 152* 118*  BUN 19 19  CREATININE 1.23* 0.95  CALCIUM 8.7* 8.8*  GFRNONAA 43* 58*  GFRAA 49* >60  ANIONGAP 13 12     Hematology Recent Labs  Lab 09/02/19 1730  WBC 4.8  RBC 3.59*  HGB 11.0*  HCT 35.1*  MCV 97.8  MCH 30.6  MCHC 31.3  RDW 13.9  PLT 224    Cardiac EnzymesNo results for input(s): TROPONINI in the last 168 hours. No results for input(s): TROPIPOC in the last 168 hours.   BNP Recent Labs  Lab 09/03/19 0810  BNP 269.2*     DDimer No results for input(s): DDIMER in the last 168 hours.   Radiology    Dg Chest 2 View  Result Date: 09/02/2019 CLINICAL DATA:  Short of breath and atrial fibrillation. Weight gain. EXAM: CHEST - 2 VIEW COMPARISON:  07/12/2017 FINDINGS: Cardiac enlargement. No pleural effusion or edema. Diffuse reticular interstitial opacities of interstitial lung  disease are again noted. No superimposed airspace consolidation. The visualized osseous structures are notable for multi level degenerative disc disease. IMPRESSION: 1. No acute cardiopulmonary abnormalities. 2. Stable reticular interstitial opacities compatible with interstitial lung disease. Electronically Signed   By: Signa Kell M.D.   On: 09/02/2019 18:18   Ct Angio Chest Pe W And/or Wo Contrast  Result Date: 09/03/2019 CLINICAL DATA:  Suspected PE. EXAM: CT ANGIOGRAPHY CHEST WITH CONTRAST TECHNIQUE: Multidetector CT imaging of the chest was performed using the standard protocol during bolus administration of intravenous contrast. Multiplanar CT image reconstructions and MIPs were obtained to evaluate the vascular anatomy. CONTRAST:  75 mL OMNIPAQUE IOHEXOL 350 MG/ML SOLN COMPARISON:  04/18/2019  FINDINGS: Cardiovascular: Heart size remains enlarged. No signs of pericardial effusion. Scattered atherosclerotic changes throughout the thoracic aorta, not well evaluated given bolus timing which is optimized for assessment of pulmonary vessels. No signs of acute pulmonary embolism. Signs of coronary artery calcification. Mediastinum/Nodes: Mild nodal enlargement in left hilum, largest approximately 8 mm. Similar to the prior study. Lungs/Pleura: Patchy ground-glass and basilar atelectasis. No signs of pleural effusion or dense consolidation. Unchanged from prior studies. Airways are patent. Upper Abdomen: Caval engorgement similar to prior study. Reflux of injected contrast into the hepatic venous system and IVC. No acute upper abdominal process. Musculoskeletal: Spinal degenerative change as on prior studies. No acute bone finding or destructive bone process. Review of the MIP images confirms the above findings. IMPRESSION: 1. No pulmonary emboli identified. 2. Stable cardiomegaly. Reflux of injected contrast into hepatic veins likely relates to underlying cardiac dysfunction and or pulmonary arterial hypertension as discussed on prior exams. 3. Underlying interstitial thickening is similar to prior studies. 4. Atherosclerotic changes in the thoracic aorta. Coronary artery calcification. Aortic Atherosclerosis (ICD10-I70.0). Electronically Signed   By: Donzetta Kohut M.D.   On: 09/03/2019 10:18    Cardiac Studies   Echocardiogram 09/03/2019: 1. Left ventricular ejection fraction, by visual estimation, is 45 to 50%. The left ventricle has mildly decreased function. There is no left ventricular hypertrophy.  2. Left ventricular diastolic function could not be evaluated.  3. Global right ventricle has normal systolic function.The right ventricular size is mildly enlarged. No increase in right ventricular wall thickness.  4. Left atrial size was moderately dilated.  5. Right atrial size was severely  dilated.  6. Mild mitral annular calcification.  7. The mitral valve is normal in structure. Mild mitral valve regurgitation. No evidence of mitral stenosis.  8. The tricuspid valve is normal in structure. Tricuspid valve regurgitation moderate-severe.  9. The aortic valve has an indeterminant number of cusps. Aortic valve regurgitation is not visualized. Mild to moderate aortic valve sclerosis/calcification without any evidence of aortic stenosis. 10. The pulmonic valve was not well visualized. Pulmonic valve regurgitation is not visualized. 11. Moderately elevated pulmonary artery systolic pressure. 12. The inferior vena cava is dilated in size with >50% respiratory variability, suggesting right atrial pressure of 8 mmHg. 13. Mild global reduction in LV systolic function; biatrial enlargement; mild RVE; mild MR; moderate to severe TR.  Right/Left heart catheterization 04/2017:  Right dominant coronary anatomy.  Normal coronary arteries.  Estimated left ventricular ejection fraction 35-40%, global hypokinesis, with elevated filling pressures consistent with acute on chronic combined systolic and diastolic heart failure.   RECOMMENDATIONS:   Consideration of tachycardia related left ventricular systolic dysfunction should be given.  Rate control and possible rhythm control of atrial fibrillation per primary team.  Echocardiogram 08/2017: Study Conclusions  - Left  ventricle: Systolic function was normal. The estimated ejection fraction was in the range of 55% to 60%. Doppler parameters are consistent with both elevated ventricular end-diastolic filling pressure and elevated left atrial filling pressure. - Aortic valve: No CW doppler done note indicated limited echo with definity for EF. - Left atrium: The atrium was moderately dilated.  Patient Profile     78 y.o. female with PMH of paroxsymal atrial fibrillation s/p DCCV 08/28/2019, chronic combined CHF (EF  45-50%, improved to 55-60% on last echo 08/2017), HTN, HLD, DM type 2, and DVT, who was admitted for atrial fibrillation and CHF.    Assessment & Plan    1. Acute on chronic combined CHF: patient has had DOE, weight gain of 5lbs, and LE edema over the past 5 days. EF was down to 45-50 at the time of Afib diagnosis in 2018 with recovery of LV function on subsequent echo 08/2017 with EF 55-60%. BNP elevated to 269. No overt edema on CXR or CT Chest. Echo this admission with EF down to 45-50% again. I&O's incomplete, though UOP -3L this admission. Weight is down to 226lbs today from 232lbs yesterday. Still with faint crackles at lung bases. DOE is likely multifactorial with cor pulmonale, OSH, and CHF.  - Continue diuresis with IV lasix 40mg  BID - Continue metoprolol succinate - Monitor strict I&Os and daily weights - Monitor electrolytes and replete to maintain K>4, Mg >2  2. Paroxysmal atrial fibrillation with RVR: recurrent atrial fibrillation despite recent DCCV 08/28/2019. Rates improved this morning after metoprolol succinate increased to 100mg  BID - EP to see today for antiarrhythmic options - unable to use amiodarone due to pulmonary toxicity - Continue xarelto - dose was 12 hours late due to not receiving meds in the ED on the evening of 09/02/2019 - Continue metoprolol succinate for rate control  3. HTN: BP stable - Continue metoprolol succinate  4. HLD: No recent lipids - LDL 62 in 2018 - Continue statin  5. Pulmonary fibrosis: stable on CT chest. Likely 2/2 amiodarone. Likely contributing to DOE  For questions or updates, please contact [facility] Please consult [email or web address] for contact info under Cardiology/STEMI.      Signed, Beatriz StallionKrista M. Kroeger, PA-C  09/04/2019, 8:18 AM   [phone]   Attending Note:   The patient was seen and examined.  Agree with assessment and plan as noted above.  Changes made to the above note as needed.  Patient seen and independently  examined with Judy PimpleKrista Kroeger, PA .   We discussed all aspects of the encounter. I agree with the assessment and plan as stated above.  1.   Atrial fib:  HR is better.   She has failed amio.   We have asked EP to see with us to consider tikosyn therapy.  2.   Acute on chronic combined CHF :   Some improvement after diuresis.   Still has some rales posteriorly  Continue lasix  BP is marginal at this point.  Will consider adding ARB perhaps as OP once her BP is higher.   3.  Pulmonary fibrosis:   Likely due to amiodarone ( which has been stopped  )    I have spent a total of 40 minutes with patient reviewing hospital  notes , telemetry, EKGs, labs and examining patient as well as establishing an assessment and plan that was discussed with the patient. > 50% of time was spent in direct patient care.    Vesta MixerPhilip J. Karry Barrilleaux, Montez HagemanJr., MD, [facility] HospitalFACC  09/04/2019, 10:26 AM [address],  [address] Office [phone] Pager [phone]

## 2019-09-04 NOTE — Progress Notes (Addendum)
Pharmacy Review for Dofetilide (Tikosyn) Initiation  Admit Complaint: 78 y.o. female admitted 09/02/2019 with atrial fibrillation to be initiated on dofetilide.   Assessment:  Patient Exclusion Criteria: If any screening criteria checked as "Yes", then  patient  should NOT receive dofetilide until criteria item is corrected. If "Yes" please indicate correction plan.  YES  NO Patient  Exclusion Criteria Correction Plan  []  [x]  Baseline QTc interval is greater than or equal to 440 msec. IF above YES box checked dofetilide contraindicated unless patient has ICD; then may proceed if QTc 500-550 msec or with known ventricular conduction abnormalities may proceed with QTc 550-600 msec. QTc =  390 on 11/16   [x]  []  Magnesium level is less than 1.8 mEq/l : Last magnesium:  Lab Results  Component Value Date   MG 1.3 (L) 09/04/2019       Magnesium 4g IV x 1 Repeat level this PM  []  [x]  Potassium level is less than 4 mEq/l : Last potassium:  Lab Results  Component Value Date   K 4.4 09/04/2019         []  [x]  Patient is known or suspected to have a digoxin level greater than 2 ng/ml: No results found for: DIGOXIN    []  [x]  Creatinine clearance less than 20 ml/min (calculated using Cockcroft-Gault, actual body weight and serum creatinine): Estimated Creatinine Clearance: 56.6 mL/min (by C-G formula based on SCr of 0.95 mg/dL).  CrCl with TBW = 81  []  [x]  Patient has received drugs known to prolong the QT intervals within the last 48 hours (phenothiazines, tricyclics or tetracyclic antidepressants, erythromycin, H-1 antihistamines, cisapride, fluoroquinolones, azithromycin). Drugs not listed above may have an, as yet, undetected potential to prolong the QT interval, updated information on QT prolonging agents is available at this website:QT prolonging agents   []  [x]  Patient received a dose of hydrochlorothiazide (Oretic) alone or in any combination including triamterene (Dyazide, Maxzide) in  the last 48 hours.   []  [x]  Patient received a medication known to increase dofetilide plasma concentrations prior to initial dofetilide dose:  . Trimethoprim (Primsol, Proloprim) in the last 36 hours . Verapamil (Calan, Verelan) in the last 36 hours or a sustained release dose in the last 72 hours . Megestrol (Megace) in the last 5 days  . Cimetidine (Tagamet) in the last 6 hours . Ketoconazole (Nizoral) in the last 24 hours . Itraconazole (Sporanox) in the last 48 hours  . Prochlorperazine (Compazine) in the last 36 hours    []  [x]  Patient is known to have a history of torsades de pointes; congenital or acquired long QT syndromes.   []  [x]  Patient has received a Class 1 antiarrhythmic with less than 2 half-lives since last dose. (Disopyramide, Quinidine, Procainamide, Lidocaine, Mexiletine, Flecainide, Propafenone)   []  [x]  Patient has received amiodarone therapy in the past 3 months or amiodarone level is greater than 0.3 ng/ml.    Patient has been appropriately anticoagulated with Xarelto.  Ordering provider was confirmed at [email or web address] if they are not listed on the [facility] Prescribers list.  Goal of Therapy: Follow renal function, electrolytes, potential drug interactions, and dose adjustment. Provide education and 1 week supply at discharge.  Plan:  [x]   Physician selected initial dose within range recommended for patients level of renal function - will monitor for response.  []   Physician selected initial dose outside of range recommended for patients level of renal function - will discuss if the dose should be altered at this time.  Select One Calculated CrCl  Dose q12h  [x]  > 60 ml/min 500 mcg  []  40-60 ml/min 250 mcg  []  20-40 ml/min 125 mcg   2. Follow up QTc after the first 5 doses, renal function, electrolytes (K & Mg) daily x 3     days, dose adjustment, success of initiation and facilitate 1 week discharge supply as     clinically indicated.  3.  Initiate Tikosyn education video (Call and ask for Tikosyn Video # 116).  4. Place Enrollment Form on the chart for discharge supply of dofetilide.      Tabbatha Bordelon S. , PharmD, BCPS Clinical Staff Pharmacist [id number] [address] 1:40 PM 09/04/2019

## 2019-09-05 LAB — BASIC METABOLIC PANEL
Anion gap: 13 (ref 5–15)
BUN: 26 mg/dL — ABNORMAL HIGH (ref 8–23)
CO2: 26 mmol/L (ref 22–32)
Calcium: 8.7 mg/dL — ABNORMAL LOW (ref 8.9–10.3)
Chloride: 94 mmol/L — ABNORMAL LOW (ref 98–111)
Creatinine, Ser: 1.21 mg/dL — ABNORMAL HIGH (ref 0.44–1.00)
GFR calc Af Amer: 50 mL/min — ABNORMAL LOW (ref >60)
GFR calc non Af Amer: 43 mL/min — ABNORMAL LOW (ref >60)
Glucose, Bld: 137 mg/dL — ABNORMAL HIGH (ref 70–99)
Potassium: 4 mmol/L (ref 3.5–5.1)
Sodium: 133 mmol/L — ABNORMAL LOW (ref 135–145)

## 2019-09-05 LAB — GLUCOSE, CAPILLARY
Glucose-Capillary: 115 mg/dL — ABNORMAL HIGH (ref 70–99)
Glucose-Capillary: 121 mg/dL — ABNORMAL HIGH (ref 70–99)
Glucose-Capillary: 143 mg/dL — ABNORMAL HIGH (ref 70–99)
Glucose-Capillary: 130 mg/dL — ABNORMAL HIGH (ref 70–99)

## 2019-09-05 LAB — MAGNESIUM: Magnesium: 1.9 mg/dL (ref 1.7–2.4)

## 2019-09-05 MED ORDER — POTASSIUM CHLORIDE CRYS ER 20 MEQ PO TBCR
40.0000 meq | EXTENDED_RELEASE_TABLET | Freq: Two times a day (BID) | ORAL | Status: DC
  Administered 2019-09-05 – 2019-09-08 (×8): 40 meq via ORAL
  Filled 2019-09-05 (×8): qty 2

## 2019-09-05 MED ORDER — MAGNESIUM SULFATE 2 GM/50ML IV SOLN
2.0000 g | INTRAVENOUS | Status: AC
  Administered 2019-09-05: 2 g via INTRAVENOUS
  Filled 2019-09-05: qty 50

## 2019-09-05 NOTE — Progress Notes (Addendum)
Electrophysiology Rounding Note  Patient Name: Carrie Haynes Date of Encounter: 09/05/2019  Primary Cardiologist: Thurmon Fair, MD  Electrophysiologist: None    Subjective   Pt remains in afib on Tikosyn 500 mcg BID   QTc from EKG last pm shows stable QTc at ~390 but difficult to measure overall due to low voltage  The patient is feeling better today. Remains SOB with minimal exertion and with cough.  Inpatient Medications    Scheduled Meds: . atorvastatin  20 mg Oral QPM  . cholecalciferol  5,000 Units Oral Daily  . dofetilide  500 mcg Oral BID  . furosemide  40 mg Intravenous BID  . insulin aspart  0-5 Units Subcutaneous QHS  . insulin aspart  0-9 Units Subcutaneous TID WC  . metoprolol succinate  100 mg Oral BID  . rivaroxaban  20 mg Oral Q supper  . sodium chloride flush  3 mL Intravenous Q12H   Continuous Infusions: . sodium chloride     PRN Meds: sodium chloride, acetaminophen, guaiFENesin-dextromethorphan, sodium chloride flush   Vital Signs    Vitals:   09/04/19 2319 09/05/19 0223 09/05/19 0630 09/05/19 0749  BP: 107/85 112/72  106/84  Pulse: 97 (!) 103  89  Resp: (!) 22 (!) 24  19  Temp: 97.8 F (36.6 C) (!) 97.4 F (36.3 C)  97.8 F (36.6 C)  TempSrc: Oral Oral  Oral  SpO2: 91% 90%  96%  Weight:   101.7 kg   Height:        Intake/Output Summary (Last 24 hours) at 09/05/2019 0756 Last data filed at 09/05/2019 0754 Gross per 24 hour  Intake 1081.3 ml  Output 1800 ml  Net -718.7 ml   Filed Weights   09/04/19 1417 09/04/19 1500 09/05/19 0630  Weight: 103.3 kg 104.7 kg 101.7 kg    Physical Exam    GEN- The patient is fatigued appearing, alert and oriented x 3 today.   Head- normocephalic, atraumatic Eyes-  Sclera clear, conjunctiva pink Ears- hearing intact Oropharynx- clear Neck- supple Lungs- Clear to ausculation bilaterally, normal work of breathing Heart- Regular rate and rhythm, no murmurs, rubs or gallops GI- soft, NT, ND,  + BS Extremities- no clubbing, cyanosis, or edema Skin- no rash or lesion Psych- euthymic mood, full affect Neuro- strength and sensation are intact  Labs    CBC Recent Labs    09/02/19 1730  WBC 4.8  HGB 11.0*  HCT 35.1*  MCV 97.8  PLT 224   Basic Metabolic Panel Recent Labs    78/24/23 0441  09/04/19 1806 09/05/19 0244  NA 136  --   --  133*  K 4.4  --   --  4.0  CL 100  --   --  94*  CO2 24  --   --  26  GLUCOSE 118*  --   --  137*  BUN 19  --   --  26*  CREATININE 0.95  --   --  1.21*  CALCIUM 8.8*  --   --  8.7*  MG  --    < > 2.5* 1.9   < > = values in this interval not displayed.    Potassium  Date/Time Value Ref Range Status  09/05/2019 02:44 AM 4.0 3.5 - 5.1 mmol/L Final   Magnesium  Date/Time Value Ref Range Status  09/05/2019 02:44 AM 1.9 1.7 - 2.4 mg/dL Final    Comment:    Performed at [facility], [address], [place], [place] [address]    Telemetry    AF 90-100s (personally reviewed)  Radiology    Ct Angio Chest Pe W And/or Wo Contrast  Result Date: 09/03/2019 CLINICAL DATA:  Suspected PE. EXAM: CT ANGIOGRAPHY CHEST WITH CONTRAST TECHNIQUE: Multidetector CT imaging of the chest was performed using the standard protocol during bolus administration of intravenous contrast. Multiplanar CT image reconstructions and MIPs were obtained to evaluate the vascular anatomy. CONTRAST:  75 mL OMNIPAQUE IOHEXOL 350 MG/ML SOLN COMPARISON:  04/18/2019 FINDINGS: Cardiovascular: Heart size remains enlarged. No signs of pericardial effusion. Scattered atherosclerotic changes throughout the thoracic aorta, not well evaluated given bolus timing which is optimized for assessment of pulmonary vessels. No signs of acute pulmonary embolism. Signs of coronary artery calcification. Mediastinum/Nodes: Mild nodal enlargement in left hilum, largest approximately 8 mm. Similar to the prior study. Lungs/Pleura: Patchy ground-glass and basilar atelectasis. No signs of  pleural effusion or dense consolidation. Unchanged from prior studies. Airways are patent. Upper Abdomen: Caval engorgement similar to prior study. Reflux of injected contrast into the hepatic venous system and IVC. No acute upper abdominal process. Musculoskeletal: Spinal degenerative change as on prior studies. No acute bone finding or destructive bone process. Review of the MIP images confirms the above findings. IMPRESSION: 1. No pulmonary emboli identified. 2. Stable cardiomegaly. Reflux of injected contrast into hepatic veins likely relates to underlying cardiac dysfunction and or pulmonary arterial hypertension as discussed on prior exams. 3. Underlying interstitial thickening is similar to prior studies. 4. Atherosclerotic changes in the thoracic aorta. Coronary artery calcification. Aortic Atherosclerosis (ICD10-I70.0). Electronically Signed   By: Zetta Bills M.D.   On: 09/03/2019 10:18     Patient Profile     Carrie Haynes is a 78 y.o. female with a past medical history significant for persistent atrial fibrillation.  They were admitted for tikosyn load.   Assessment & Plan    1. Persistent atrial fibrillation Pt remains in afib on Tikosyn 500 mcg BID  Continue Xarelto Electrolytes stable. K 4.0, Mg 1.9, Myana Schlup supp gently.  CHA2DS2VASC is 7  Yi Falletta have case management look at her insurance for [employer] She is worried she may have trouble with transportation if discharged on Saturday; She Farrell Pantaleo call her ride today to discuss.   2. Acute on chronic combined CHF Echo this admit LVEF 45-50% JVP remains elevated, and SOB with minimal exertion.  Continue IV lasix 40 mg BID. Negative 3.7 L this admit so far.  Continue Toprol 100 mg BID  If pt does not convert chemically, plan on DCCV tomorrow   For questions or updates, please contact [facility] Please consult [email or web address] for contact info under Cardiology/STEMI.  Signed, Shirley Friar, PA-C  09/05/2019, 7:56 AM   I  have seen and examined this patient with Oda Kilts.  Agree with above, note added to reflect my findings.  On exam, irregular, no murmurs, lungs clear.  Continues to have elevated JVD.  We Percy Comp continue with diuresis.  She remains in atrial fibrillation, but is tolerating her current dofetilide dose.  We Candie Gintz continue with plan for cardioversion tomorrow.  Brix Brearley M. Jonmarc Bodkin MD 09/05/2019 11:03 AM

## 2019-09-05 NOTE — Progress Notes (Signed)
Patient has converted to NSR, confirmed by 12 lead EKG.  HR 60s.  Carrie Haynes with cardiology notified.  Will continue to monitor.

## 2019-09-05 NOTE — Progress Notes (Signed)
Morning EKG reviewed  Shows pt remains in afib at 100 bpm with stable QTc at ~390 ms, again note low voltage.   Continue Tikosyn 500 mcg BID.   Pt will be NPO after midnight for DCCV if remains in [place], [place]  Pager: [phone]  09/05/2019 2:51 PM

## 2019-09-06 ENCOUNTER — Encounter (HOSPITAL_COMMUNITY): Payer: Self-pay | Admitting: Cardiovascular Disease

## 2019-09-06 ENCOUNTER — Encounter (HOSPITAL_COMMUNITY)
Admission: EM | Disposition: A | Discharge status: Home / Self Care | Payer: Self-pay | Source: Home / Self Care | Attending: Cardiovascular Disease

## 2019-09-06 ENCOUNTER — Inpatient Hospital Stay (HOSPITAL_COMMUNITY): Payer: Medicare Other | Admitting: Anesthesiology

## 2019-09-06 LAB — GLUCOSE, CAPILLARY
Glucose-Capillary: 115 mg/dL — ABNORMAL HIGH (ref 70–99)
Glucose-Capillary: 166 mg/dL — ABNORMAL HIGH (ref 70–99)
Glucose-Capillary: 108 mg/dL — ABNORMAL HIGH (ref 70–99)
Glucose-Capillary: 144 mg/dL — ABNORMAL HIGH (ref 70–99)

## 2019-09-06 LAB — BASIC METABOLIC PANEL
Anion gap: 14 (ref 5–15)
BUN: 32 mg/dL — ABNORMAL HIGH (ref 8–23)
CO2: 25 mmol/L (ref 22–32)
Calcium: 9.1 mg/dL (ref 8.9–10.3)
Chloride: 93 mmol/L — ABNORMAL LOW (ref 98–111)
Creatinine, Ser: 1.23 mg/dL — ABNORMAL HIGH (ref 0.44–1.00)
GFR calc Af Amer: 49 mL/min — ABNORMAL LOW (ref >60)
GFR calc non Af Amer: 43 mL/min — ABNORMAL LOW (ref >60)
Glucose, Bld: 124 mg/dL — ABNORMAL HIGH (ref 70–99)
Potassium: 4.5 mmol/L (ref 3.5–5.1)
Sodium: 132 mmol/L — ABNORMAL LOW (ref 135–145)

## 2019-09-06 LAB — MAGNESIUM: Magnesium: 2.2 mg/dL (ref 1.7–2.4)

## 2019-09-06 SURGERY — CANCELLED PROCEDURE

## 2019-09-06 MED ORDER — FUROSEMIDE 40 MG PO TABS
40.0000 mg | ORAL_TABLET | Freq: Two times a day (BID) | ORAL | Status: DC
  Administered 2019-09-06 – 2019-09-08 (×5): 40 mg via ORAL
  Filled 2019-09-06 (×5): qty 1

## 2019-09-06 NOTE — Progress Notes (Signed)
Pharmacy: Dofetilide (Tikosyn) - Follow Up Assessment and Electrolyte Replacement  Pharmacy consulted to assist in monitoring and replacing electrolytes in this 78 y.o. female admitted on 09/02/2019 undergoing dofetilide initiation. First dofetilide dose: 11/18  Labs:    Component Value Date/Time   K 4.5 09/06/2019 0157   MG 2.2 09/06/2019 0157     Plan: Potassium: K >/= 4: Continue current replacement of 28meq bid while on IV lasix  Magnesium: Mg > 2: No additional supplementation needed   As patient has required on average 80 mEq of potassium replacement every day. Will hold off on making recommendations on potassium replacement until volume status and lasix dose is stable   Thank you for allowing pharmacy to participate in this patient's care   Erin Hearing PharmD., BCPS Clinical Pharmacist 09/06/2019 8:40 AM

## 2019-09-06 NOTE — Progress Notes (Addendum)
Electrophysiology Rounding Note  Patient Name: Carrie Haynes Date of Encounter: 09/06/2019  Primary Cardiologist: Sanda Klein, MD  Electrophysiologist: None    Subjective   Pt converted to sinus rhythm on Tikosyn 500 mcg BID   QTc from EKG last pm shows borderline QTc at ~480 ms  The patient is doing well today.  At this time, the patient denies chest pain or any new concerns. Continues with cough. Feels like swelling and SOB have improved.  Inpatient Medications    Scheduled Meds: . atorvastatin  20 mg Oral QPM  . cholecalciferol  5,000 Units Oral Daily  . dofetilide  500 mcg Oral BID  . furosemide  40 mg Intravenous BID  . insulin aspart  0-5 Units Subcutaneous QHS  . insulin aspart  0-9 Units Subcutaneous TID WC  . metoprolol succinate  100 mg Oral BID  . potassium chloride  40 mEq Oral BID  . rivaroxaban  20 mg Oral Q supper  . sodium chloride flush  3 mL Intravenous Q12H   Continuous Infusions: . sodium chloride     PRN Meds: sodium chloride, acetaminophen, guaiFENesin-dextromethorphan, sodium chloride flush   Vital Signs    Vitals:   09/06/19 0008 09/06/19 0343 09/06/19 0435 09/06/19 0732  BP: 118/84 112/81  126/88  Pulse:      Resp: 17 19  15   Temp: 97.7 F (36.5 C) 97.7 F (36.5 C)  98 F (36.7 C)  TempSrc: Oral Oral  Oral  SpO2: 93%   93%  Weight:   101.5 kg   Height:        Intake/Output Summary (Last 24 hours) at 09/06/2019 0823 Last data filed at 09/06/2019 0017 Gross per 24 hour  Intake 485 ml  Output 1950 ml  Net -1465 ml   Filed Weights   09/04/19 1500 09/05/19 0630 09/06/19 0435  Weight: 104.7 kg 101.7 kg 101.5 kg    Physical Exam    GEN- The patient is well appearing, alert and oriented x 3 today.   Head- normocephalic, atraumatic Eyes-  Sclera clear, conjunctiva pink Ears- hearing intact Oropharynx- clear Neck- supple Lungs- Clear to ausculation bilaterally, normal work of breathing Heart- Regular rate and rhythm,  no murmurs, rubs or gallops GI- soft, NT, ND, + BS Extremities- no clubbing, cyanosis, or edema Skin- no rash or lesion Psych- euthymic mood, full affect Neuro- strength and sensation are intact  Labs    CBC No results for input(s): WBC, NEUTROABS, HGB, HCT, MCV, PLT in the last 72 hours. Basic Metabolic Panel Recent Labs    09/05/19 0244 09/06/19 0157  NA 133* 132*  K 4.0 4.5  CL 94* 93*  CO2 26 25  GLUCOSE 137* 124*  BUN 26* 32*  CREATININE 1.21* 1.23*  CALCIUM 8.7* 9.1  MG 1.9 2.2    Potassium  Date/Time Value Ref Range Status  09/06/2019 01:57 AM 4.5 3.5 - 5.1 mmol/L Final   Magnesium  Date/Time Value Ref Range Status  09/06/2019 01:57 AM 2.2 1.7 - 2.4 mg/dL Final    Comment:    Performed at [facility], [place] [address], [place], [place] [address]    Telemetry    NSR/sinus brady 50-60s (personally reviewed)  Radiology    No results found.   Patient Profile     Carrie Haynes is a 78 y.o. female with a past medical history significant for persistent atrial fibrillation.  They were admitted for tikosyn load.   Assessment & Plan  1. Persistent atrial fibrillation Pt converted to sinus rhythm on Tikosyn 500 mcg BID  Continue Xarelto Electrolytes stable this am. CHA2DS2VASC is 7  If more brady, can cut back on her Toprol 100 mg BID.  Pt Carrie Haynes not require DCCV.   2. Acute on chronic combined CHF Echo this admit LVEF 45-50% Volume status improved but remains at least mildly elevated. Give IV lasix 40 mg this am, then plan transition back to PO 40 mg BID now that she is in sinus.  For questions or updates, please contact [facility] Please consult [email or web address] for contact info under Cardiology/STEMI.  Signed, Graciella Freer, PA-C  09/06/2019, 8:23 AM   I have seen and examined this patient with Carrie Haynes.  Agree with above, note added to reflect my findings.  On exam, RRR, no murmurs, lungs clear.  Patient returned to sinus  rhythm overnight.  We Carrie Haynes continue dofetilide at the current dose.  She does continue to need some diuresis.  Carrie Haynes diurese with IV medications this morning and switch to p.o. after her a.m. dose.  Carrie Haynes M. Carrie Crochet MD 09/06/2019 8:59 AM

## 2019-09-06 NOTE — Progress Notes (Addendum)
  Awaiting insurance coverage for tikosyn via Case management.   Out of Pocket pricing, with Good-Rx card  [facility]  500 mg tablets, 60# = $122 (Currently in stock at [facility])  [facility]  250 mg tablets, 60# = $122 (Currently in [facility]) [facility] 500 mg tablets, 60# = $29.95 (Currently out of stock) [facility] 250 mg tablets, 60# = $93.52 (Currently out of stock)  Will await case management call back concerning insurance coverage.  Depending on patient ability to pay for med and insurance coverage, may need to delay discharge until Monday so that a week supply can be supplied through [facility] to bridge gap for medication ordering.   Legrand Como [address]" [place], PA-C  09/06/2019 3:17 PM

## 2019-09-06 NOTE — Progress Notes (Signed)
Morning EKG reviewed  Shows remains in NSR at 64 bpm with borderline QTc at 480-496 ms depending on lead, when measured by hand and corrected for rate.  Continue Tikosyn 500 mcg BID for now and will review with Dr. Curt Bears.   Shirley Friar, PA-C  Pager: [phone]  09/06/2019 12:53 PM

## 2019-09-06 NOTE — Anesthesia Preprocedure Evaluation (Deleted)
Anesthesia Evaluation  Patient identified by MRN, date of birth, ID band Patient awake    Reviewed: Allergy & Precautions, NPO status , Patient's Chart, lab work & pertinent test results, reviewed documented beta blocker date and time   History of Anesthesia Complications Negative for: history of anesthetic complications  Airway Mallampati: II  TM Distance: >3 FB Neck ROM: Full    Dental  (+) Partial Upper   Pulmonary neg pulmonary ROS,    Pulmonary exam normal        Cardiovascular hypertension, Pt. on home beta blockers and Pt. on medications + DOE  Normal cardiovascular exam+ dysrhythmias (on Xarelto) Atrial Fibrillation   TTE 2018: EF 55-60%, moderate LAE   Neuro/Psych negative neurological ROS  negative psych ROS   GI/Hepatic negative GI ROS, Neg liver ROS,   Endo/Other  diabetes, Type 2, Oral Hypoglycemic AgentsMorbid obesity  Renal/GU negative Renal ROS  negative genitourinary   Musculoskeletal negative musculoskeletal ROS (+)   Abdominal   Peds  Hematology negative hematology ROS (+)   Anesthesia Other Findings Day of surgery medications reviewed with patient.  Reproductive/Obstetrics negative OB ROS                             Anesthesia Physical  Anesthesia Plan  ASA: III  Anesthesia Plan: General   Post-op Pain Management:    Induction: Intravenous  PONV Risk Score and Plan: Treatment may vary due to age or medical condition and Propofol infusion  Airway Management Planned: Mask  Additional Equipment: None  Intra-op Plan:   Post-operative Plan:   Informed Consent: I have reviewed the patients History and Physical, chart, labs and discussed the procedure including the risks, benefits and alternatives for the proposed anesthesia with the patient or authorized representative who has indicated his/her understanding and acceptance.     Dental advisory  given  Plan Discussed with: CRNA and Anesthesiologist  Anesthesia Plan Comments:         Anesthesia Quick Evaluation

## 2019-09-06 NOTE — TOC Benefit Eligibility Note (Signed)
Transition of Care [facility]) Benefit Eligibility Note    Patient Details  Name: Carrie Haynes MRN: [id number] Date of Birth: 11-11-1940   Medication/Dose: DOFETILIDE   500 MCG, 250 MCG , 125 MCG BID  Covered?: Yes  Tier: (TIER- 4 DRUG)  Prescription Coverage [facility]  Spoke with Person/Company/Phone Number:: JUDY  @ [employer] # [phone]  Co-Pay: 500 MCG BID- $ 72.97  ,  250 MCG BID - $145.56      125 MCG BID - $72.97  Prior Approval: No  Deductible: Met  Additional Notes: TIKOSYN  500 MCG, 250 MCG , 125 MCG  BID: NOT COVER  , [place] # [phone]    Memory Argue Phone Number: 09/06/2019, 3:54 PM

## 2019-09-07 DIAGNOSIS — I428 Other cardiomyopathies: Secondary | ICD-10-CM

## 2019-09-07 LAB — BASIC METABOLIC PANEL
Anion gap: 12 (ref 5–15)
BUN: 36 mg/dL — ABNORMAL HIGH (ref 8–23)
CO2: 27 mmol/L (ref 22–32)
Calcium: 8.7 mg/dL — ABNORMAL LOW (ref 8.9–10.3)
Chloride: 93 mmol/L — ABNORMAL LOW (ref 98–111)
Creatinine, Ser: 1.11 mg/dL — ABNORMAL HIGH (ref 0.44–1.00)
GFR calc Af Amer: 56 mL/min — ABNORMAL LOW (ref >60)
GFR calc non Af Amer: 48 mL/min — ABNORMAL LOW (ref >60)
Glucose, Bld: 192 mg/dL — ABNORMAL HIGH (ref 70–99)
Potassium: 4.5 mmol/L (ref 3.5–5.1)
Sodium: 132 mmol/L — ABNORMAL LOW (ref 135–145)

## 2019-09-07 LAB — GLUCOSE, CAPILLARY
Glucose-Capillary: 97 mg/dL (ref 70–99)
Glucose-Capillary: 102 mg/dL — ABNORMAL HIGH (ref 70–99)
Glucose-Capillary: 153 mg/dL — ABNORMAL HIGH (ref 70–99)
Glucose-Capillary: 185 mg/dL — ABNORMAL HIGH (ref 70–99)

## 2019-09-07 LAB — MAGNESIUM: Magnesium: 1.8 mg/dL (ref 1.7–2.4)

## 2019-09-07 MED ORDER — MAGNESIUM SULFATE 2 GM/50ML IV SOLN
2.0000 g | Freq: Once | INTRAVENOUS | Status: AC
  Administered 2019-09-07: 2 g via INTRAVENOUS
  Filled 2019-09-07: qty 50

## 2019-09-07 MED ORDER — METOPROLOL SUCCINATE ER 100 MG PO TB24
100.0000 mg | ORAL_TABLET | Freq: Every day | ORAL | Status: DC
  Administered 2019-09-08 – 2019-09-09 (×2): 100 mg via ORAL
  Filled 2019-09-07 (×2): qty 1

## 2019-09-07 MED ORDER — LISINOPRIL 10 MG PO TABS
10.0000 mg | ORAL_TABLET | Freq: Every day | ORAL | Status: DC
  Administered 2019-09-07 – 2019-09-09 (×3): 10 mg via ORAL
  Filled 2019-09-07 (×3): qty 1

## 2019-09-07 NOTE — Progress Notes (Signed)
Pt ambulated in a hallway without any difficulty in breathing.Denies any pain and distress. Vitals stable, post Tikosyn EKG stable, will continue to monitor the patient, family members updated.  Palma Holter, RN

## 2019-09-07 NOTE — Plan of Care (Signed)
  Problem: Education: Goal: Knowledge of General Education information will improve Description Including pain rating scale, medication(s)/side effects and non-pharmacologic comfort measures Outcome: Progressing   Problem: Health Behavior/Discharge Planning: Goal: Ability to manage health-related needs will improve Outcome: Progressing   Problem: Clinical Measurements: Goal: Ability to maintain clinical measurements within normal limits will improve Outcome: Progressing Goal: Will remain free from infection Outcome: Progressing Goal: Diagnostic test results will improve Outcome: Progressing Goal: Respiratory complications will improve Outcome: Progressing Goal: Cardiovascular complication will be avoided Outcome: Progressing   Problem: Activity: Goal: Risk for activity intolerance will decrease Outcome: Progressing   Problem: Nutrition: Goal: Adequate nutrition will be maintained Outcome: Progressing   Problem: Coping: Goal: Level of anxiety will decrease Outcome: Progressing   Problem: Elimination: Goal: Will not experience complications related to bowel motility Outcome: Progressing Goal: Will not experience complications related to urinary retention Outcome: Progressing   Problem: Pain Managment: Goal: General experience of comfort will improve Outcome: Progressing   Problem: Safety: Goal: Ability to remain free from injury will improve Outcome: Progressing   Problem: Skin Integrity: Goal: Risk for impaired skin integrity will decrease Outcome: Progressing   Problem: Education: Goal: Knowledge of disease or condition will improve Outcome: Progressing Goal: Understanding of medication regimen will improve Outcome: Progressing   Problem: Activity: Goal: Ability to tolerate increased activity will improve Outcome: Progressing   Problem: Cardiac: Goal: Ability to achieve and maintain adequate cardiopulmonary perfusion will improve Outcome: Progressing    Problem: Health Behavior/Discharge Planning: Goal: Ability to safely manage health-related needs after discharge will improve Outcome: Progressing   

## 2019-09-07 NOTE — Progress Notes (Signed)
Electrophysiology Rounding Note  Patient Name: Carrie Haynes Date of Encounter: 09/07/2019  Primary Cardiologist: Thurmon Fair, MD  Electrophysiologist: None    Subjective   Pt converted to sinus rhythm on Tikosyn 500 mcg BID   Without CP or SOB   Overall feels much better than in afib RVR  Inpatient Medications    Scheduled Meds: . atorvastatin  20 mg Oral QPM  . cholecalciferol  5,000 Units Oral Daily  . dofetilide  500 mcg Oral BID  . furosemide  40 mg Oral BID  . insulin aspart  0-5 Units Subcutaneous QHS  . insulin aspart  0-9 Units Subcutaneous TID WC  . metoprolol succinate  100 mg Oral BID  . potassium chloride  40 mEq Oral BID  . rivaroxaban  20 mg Oral Q supper  . sodium chloride flush  3 mL Intravenous Q12H   Continuous Infusions: . sodium chloride    . magnesium sulfate bolus IVPB 2 g (09/07/19 1019)   PRN Meds: sodium chloride, acetaminophen, guaiFENesin-dextromethorphan, sodium chloride flush   Vital Signs    Vitals:   09/06/19 2303 09/07/19 0456 09/07/19 0500 09/07/19 0758  BP: 114/77 109/67  103/65  Pulse: 64 66  70  Resp: 18 13  16   Temp: 97.6 F (36.4 C) 98 F (36.7 C)  97.8 F (36.6 C)  TempSrc: Oral Oral  Oral  SpO2: 97% 99%    Weight:   99.8 kg   Height:        Intake/Output Summary (Last 24 hours) at 09/07/2019 1020 Last data filed at 09/07/2019 0803 Gross per 24 hour  Intake 720 ml  Output 1000 ml  Net -280 ml   Filed Weights   09/05/19 0630 09/06/19 0435 09/07/19 0500  Weight: 101.7 kg 101.5 kg 99.8 kg    Physical Exam    BP 103/65 (BP Location: Right Wrist)   Pulse 70   Temp 97.8 F (36.6 C) (Oral)   Resp 16   Ht 5' (1.524 m)   Wt 99.8 kg   SpO2 99%   BMI 42.97 kg/m  Well developed and Morbidly obese  in no acute distress HENT normal Neck supple   Clear Regular rate and rhythm, no murmurs or gallops Abd-soft with active BS No Clubbing cyanosis edema Skin-warm and dry A & Oriented  Grossly normal  sensory and motor function  ECG Read by computer at 506 w QTc of 513 I only get about 480   As she is going to be here awaiting drug will continue her on current dose and follow  Labs    CBC No results for input(s): WBC, NEUTROABS, HGB, HCT, MCV, PLT in the last 72 hours. Basic Metabolic Panel Recent Labs    09/09/19 0157 09/07/19 0147  NA 132* 132*  K 4.5 4.5  CL 93* 93*  CO2 25 27  GLUCOSE 124* 192*  BUN 32* 36*  CREATININE 1.23* 1.11*  CALCIUM 9.1 8.7*  MG 2.2 1.8    Potassium  Date/Time Value Ref Range Status  09/07/2019 01:47 AM 4.5 3.5 - 5.1 mmol/L Final   Magnesium  Date/Time Value Ref Range Status  09/07/2019 01:47 AM 1.8 1.7 - 2.4 mg/dL Final    Comment:    Performed at [facility], [address], [place], [place]    Telemetry    NSR/sinus brady 50-60s (personally reviewed)  Radiology    No results found.   Patient Profile     Marlowe Cinquemani  Steinkamp is a 78 y.o. female with a past medical history significant for persistent atrial fibrillation.  They were admitted for tikosyn load.   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 Rate related   DVT  Amio lung toxicity  Morbidly obese   QTc is borderline, as noted above-- pt to be in hospital until Monday to arrange for dofetilide therapy  So will follow it along  Will decrease metoprolol to 100 daily and begin low dose ACE for cardiomyopathy and renal protection with her DM        . Virl Axe  09/07/2019 10:20 AM

## 2019-09-07 NOTE — Progress Notes (Signed)
Pharmacy: Dofetilide (Tikosyn) - Follow Up Assessment and Electrolyte Replacement  Pharmacy consulted to assist in monitoring and replacing electrolytes in this 78 y.o. female admitted on 09/02/2019 undergoing dofetilide initiation. First dofetilide dose: 11/18  Labs:    Component Value Date/Time   K 4.5 09/07/2019 0147   MG 1.8 09/07/2019 0147     Plan: Potassium: K >/= 4: Continue current replacement of 56meq bid, patient changed to po lasix yesterday. K stable this am will follow need to reduce replacement.  Magnesium: Mg 1.8-2: Give Mg 2 gm IV x1    As patient has required on average 80 mEq of potassium replacement every day. Will hold off on making recommendations on potassium replacement until volume status and lasix dose is stable   Thank you for allowing pharmacy to participate in this patient's care   Erin Hearing PharmD., BCPS Clinical Pharmacist 09/07/2019 7:15 AM

## 2019-09-07 NOTE — Progress Notes (Signed)
@  1756 tylenol given for a complain of back pain, vitals stable, oxygen saturation is 97% in Gwenyth Ober, RN

## 2019-09-08 ENCOUNTER — Other Ambulatory Visit: Payer: Self-pay

## 2019-09-08 DIAGNOSIS — I5042 Chronic combined systolic (congestive) and diastolic (congestive) heart failure: Secondary | ICD-10-CM

## 2019-09-08 LAB — GLUCOSE, CAPILLARY
Glucose-Capillary: 108 mg/dL — ABNORMAL HIGH (ref 70–99)
Glucose-Capillary: 108 mg/dL — ABNORMAL HIGH (ref 70–99)
Glucose-Capillary: 135 mg/dL — ABNORMAL HIGH (ref 70–99)
Glucose-Capillary: 188 mg/dL — ABNORMAL HIGH (ref 70–99)

## 2019-09-08 MED ORDER — MAGNESIUM SULFATE 2 GM/50ML IV SOLN
2.0000 g | Freq: Once | INTRAVENOUS | Status: AC
  Administered 2019-09-08: 2 g via INTRAVENOUS
  Filled 2019-09-08: qty 50

## 2019-09-08 NOTE — Progress Notes (Addendum)
Pt is ambulating to the bathroom, sitting in a recliner but refused to ambulate in a hallway. She said she is too sore to walk and she knows her body, will offer again to ambulate later today. Post EKG done after Tikosyn, denies CP, distress, BP running soft, tolerating foods and medicines, will continue to monitor the patient  Palma Holter, RN

## 2019-09-08 NOTE — Progress Notes (Signed)
QT reviewed  About 420 msec, I think overestimated by the computer Continue current dose

## 2019-09-08 NOTE — Progress Notes (Addendum)
Progress Note  Patient Name: Carrie Haynes Date of Encounter: 09/08/2019  Primary Cardiologist: Dr. Royann Shivers, MD   Subjective   Pt feeling well today with no specific complaints. No SOB/CP  Inpatient Medications    Scheduled Meds:  atorvastatin  20 mg Oral QPM   cholecalciferol  5,000 Units Oral Daily   dofetilide  500 mcg Oral BID   furosemide  40 mg Oral BID   insulin aspart  0-5 Units Subcutaneous QHS   insulin aspart  0-9 Units Subcutaneous TID WC   lisinopril  10 mg Oral Daily   metoprolol succinate  100 mg Oral Daily   potassium chloride  40 mEq Oral BID   rivaroxaban  20 mg Oral Q supper   sodium chloride flush  3 mL Intravenous Q12H   Continuous Infusions:  sodium chloride     PRN Meds: sodium chloride, acetaminophen, guaiFENesin-dextromethorphan, sodium chloride flush   Vital Signs    Vitals:   09/07/19 2218 09/07/19 2247 09/08/19 0341 09/08/19 0405  BP: 94/64  (!) 94/51   Pulse: (!) 58  (!) 59   Resp: (!) 27 19 19    Temp: 97.9 F (36.6 C)  97.9 F (36.6 C)   TempSrc: Oral  Oral   SpO2: 98%  98%   Weight:    100.4 kg  Height:        Intake/Output Summary (Last 24 hours) at 09/08/2019 0737 Last data filed at 09/07/2019 1743 Gross per 24 hour  Intake 720 ml  Output 900 ml  Net -180 ml   Filed Weights   09/06/19 0435 09/07/19 0500 09/08/19 0405  Weight: 101.5 kg 99.8 kg 100.4 kg   Physical Exam   General: Obese, NAD Neck: Negative for carotid bruits. No JVD Lungs: Bilateral mild wheezes. Breathing is unlabored. Cardiovascular: RRR with S1 S2 Abdomen: Soft, non-tender, non-distended. No obvious abdominal masses. Extremities: 1+ BLE edema Neuro: Alert and oriented. No focal deficits. No facial asymmetry. MAE spontaneously. Psych: Responds to questions appropriately with normal affect.    Labs    Chemistry Recent Labs  Lab 09/05/19 0244 09/06/19 0157 09/07/19 0147  NA 133* 132* 132*  K 4.0 4.5 4.5  CL 94* 93*  93*  CO2 26 25 27   GLUCOSE 137* 124* 192*  BUN 26* 32* 36*  CREATININE 1.21* 1.23* 1.11*  CALCIUM 8.7* 9.1 8.7*  GFRNONAA 43* 43* 48*  GFRAA 50* 49* 56*  ANIONGAP 13 14 12      Hematology Recent Labs  Lab 09/02/19 1730  WBC 4.8  RBC 3.59*  HGB 11.0*  HCT 35.1*  MCV 97.8  MCH 30.6  MCHC 31.3  RDW 13.9  PLT 224    Cardiac EnzymesNo results for input(s): TROPONINI in the last 168 hours. No results for input(s): TROPIPOC in the last 168 hours.   BNP Recent Labs  Lab 09/03/19 0810  BNP 269.2*     DDimer No results for input(s): DDIMER in the last 168 hours.   Radiology    No results found.  Telemetry    09/08/2019 NSR HR 60's  - Personally Reviewed  ECG    09/08/2019: Per Dr. Graciela Husbands, QT approximately (overestimated by computer)- Personally Reviewed  Cardiac Studies   Echocardiogram 09/03/2019: 1. Left ventricular ejection fraction, by visual estimation, is 45 to 50%. The left ventricle has mildly decreased function. There is no left ventricular hypertrophy. 2. Left ventricular diastolic function could not be evaluated. 3. Global right ventricle has normal systolic function.The  right ventricular size is mildly enlarged. No increase in right ventricular wall thickness. 4. Left atrial size was moderately dilated. 5. Right atrial size was severely dilated. 6. Mild mitral annular calcification. 7. The mitral valve is normal in structure. Mild mitral valve regurgitation. No evidence of mitral stenosis. 8. The tricuspid valve is normal in structure. Tricuspid valve regurgitation moderate-severe. 9. The aortic valve has an indeterminant number of cusps. Aortic valve regurgitation is not visualized. Mild to moderate aortic valve sclerosis/calcification without any evidence of aortic stenosis. 10. The pulmonic valve was not well visualized. Pulmonic valve regurgitation is not visualized. 11. Moderately elevated pulmonary artery systolic pressure. 12. The  inferior vena cava is dilated in size with >50% respiratory variability, suggesting right atrial pressure of 8 mmHg. 13. Mild global reduction in LV systolic function; biatrial enlargement; mild RVE; mild MR; moderate to severe TR.  Right/Left heart catheterization 04/2017:  Right dominant coronary anatomy.  Normal coronary arteries.  Estimated left ventricular ejection fraction 35-40%, global hypokinesis, with elevated filling pressures consistent with acute on chronic combined systolic and diastolic heart failure.   RECOMMENDATIONS:   Consideration of tachycardia related left ventricular systolic dysfunction should be given.  Rate control and possible rhythm control of atrial fibrillation per primary team.  Echocardiogram 08/2017: Study Conclusions  - Left ventricle: Systolic function was normal. The estimated ejection fraction was in the range of 55% to 60%. Doppler parameters are consistent with both elevated ventricular end-diastolic filling pressure and elevated left atrial filling pressure. - Aortic valve: No CW doppler done note indicated limited echo with definity for EF. - Left atrium: The atrium was moderately dilated.  Patient Profile     78 y.o. female with a past medical history significant for persistent atrial fibrillation with failed DCCV 08/28/2019>>admitted for tikosyn load.  Also with a history of chronic combined CHF (EF 45-50%, improved to 55-60% on last echo 08/2017), HTN, HLD, DM type 2, and DVT  Assessment & Plan    1.  Persistent atrial fibrillation with RVR now with dofetilide therapy: -On Tikosyn 500 mcg BID>>QT per Dr. Graciela Husbands at 420 msec on EKG this AM with plans to continue current dose   -Continue Xarelto -Plan for hospitalization until Monday, 11/23 -Metoprolol decreased to 100 mg daily with the addition of ACE I for cardiomyopathy and renal protection for DM -Keep K+ greater than 4.0, Mg+ greater than 2.0 -K today 4.5 and  magnesium is 1.8>>> will replace with 2g Mg this AM  -CHA2DS2VASC is 7   2.  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 -Weight, 222lb  -I&O, net -5.8 L -Appears euvolemic on exam>>mild crackles -On lisinopril 10 -Creatinine stable   3.  DM: -SSI for glucose control while inpatient status   4.  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  Agree with assessment and plan as noted above.  Changes made to the above note as needed.  Patient seen and independently examined with Georgie Chard, NP .   We discussed all aspects of the encounter. I agree with the assessment and plan as stated above.  1.   Rapid AF:  Has converted to NSR on Tikosyn Feels  better Plan for DC tomorrow  QTc has been evaluated by Dr. Caryl Comes and is acceptable  2.  amio lung toxicity:   Following   3.  Chronic combined systolic and diastolic congestive heart failure: Possibly rate related.  She is feeling quite a bit better in normal sinus rhythm.  Continue lisinopril 10 mg a day.   I have spent a total of 40 minutes with patient reviewing hospital  notes , telemetry, EKGs, labs and examining patient as well as establishing an assessment and plan that was discussed with the patient. > 50% of time was spent in direct patient care.    Thayer Headings, Brooke Bonito., MD, [facility] 09/08/2019, 11:26 AM [address],  [address] Office [phone] Pager [phone]      For questions or updates, please contact   Please consult [email or web address] for contact info under Cardiology/STEMI.

## 2019-09-09 LAB — GLUCOSE, CAPILLARY: Glucose-Capillary: 116 mg/dL — ABNORMAL HIGH (ref 70–99)

## 2019-09-09 LAB — BASIC METABOLIC PANEL
Anion gap: 8 (ref 5–15)
BUN: 40 mg/dL — ABNORMAL HIGH (ref 8–23)
CO2: 26 mmol/L (ref 22–32)
Calcium: 8.9 mg/dL (ref 8.9–10.3)
Chloride: 99 mmol/L (ref 98–111)
Creatinine, Ser: 1.4 mg/dL — ABNORMAL HIGH (ref 0.44–1.00)
GFR calc Af Amer: 42 mL/min — ABNORMAL LOW (ref >60)
GFR calc non Af Amer: 36 mL/min — ABNORMAL LOW (ref >60)
Glucose, Bld: 146 mg/dL — ABNORMAL HIGH (ref 70–99)
Potassium: 5.7 mmol/L — ABNORMAL HIGH (ref 3.5–5.1)
Sodium: 133 mmol/L — ABNORMAL LOW (ref 135–145)

## 2019-09-09 LAB — MAGNESIUM: Magnesium: 2.2 mg/dL (ref 1.7–2.4)

## 2019-09-09 MED ORDER — DOFETILIDE 500 MCG PO CAPS: 500.0000 ug | ORAL_CAPSULE | Freq: Two times a day (BID) | ORAL | 0 refills | Status: DC

## 2019-09-09 MED ORDER — DOFETILIDE 500 MCG PO CAPS: 500.0000 ug | ORAL_CAPSULE | Freq: Two times a day (BID) | ORAL | 6 refills | Status: DC

## 2019-09-09 MED ORDER — LISINOPRIL 10 MG PO TABS: 10.0000 mg | ORAL_TABLET | Freq: Every day | ORAL | 6 refills | Status: DC

## 2019-09-09 NOTE — Progress Notes (Signed)
Patient has received 7 day supply of tikosyn and is waiting for her ride to arrive.

## 2019-09-09 NOTE — Plan of Care (Signed)
  Problem: Education: Goal: Knowledge of General Education information will improve Description: Including pain rating scale, medication(s)/side effects and non-pharmacologic comfort measures 09/09/2019 0028 by Mikey Bussing, RN Outcome: Progressing 09/09/2019 0027 by Mikey Bussing, RN Outcome: Progressing   Problem: Health Behavior/Discharge Planning: Goal: Ability to manage health-related needs will improve 09/09/2019 0028 by Mikey Bussing, RN Outcome: Progressing 09/09/2019 0027 by Mikey Bussing, RN Outcome: Progressing   Problem: Clinical Measurements: Goal: Ability to maintain clinical measurements within normal limits will improve 09/09/2019 0028 by Mikey Bussing, RN Outcome: Progressing 09/09/2019 0027 by Mikey Bussing, RN Outcome: Progressing Goal: Will remain free from infection 09/09/2019 0028 by Mikey Bussing, RN Outcome: Progressing 09/09/2019 0027 by Mikey Bussing, RN Outcome: Progressing Goal: Diagnostic test results will improve 09/09/2019 0028 by Mikey Bussing, RN Outcome: Progressing 09/09/2019 0027 by Mikey Bussing, RN Outcome: Progressing Goal: Respiratory complications will improve 09/09/2019 0028 by Mikey Bussing, RN Outcome: Progressing 09/09/2019 0027 by Mikey Bussing, RN Outcome: Progressing Goal: Cardiovascular complication will be avoided 09/09/2019 0028 by Mikey Bussing, RN Outcome: Progressing 09/09/2019 0027 by Mikey Bussing, RN Outcome: Progressing   Problem: Activity: Goal: Risk for activity intolerance will decrease 09/09/2019 0028 by Mikey Bussing, RN Outcome: Progressing 09/09/2019 0027 by Mikey Bussing, RN Outcome: Progressing   Problem: Nutrition: Goal: Adequate nutrition will be maintained 09/09/2019 0028 by Mikey Bussing, RN Outcome: Progressing 09/09/2019 0027 by Mikey Bussing, RN Outcome: Progressing   Problem: Coping: Goal: Level of anxiety will decrease 09/09/2019 0028 by Mikey Bussing, RN Outcome: Progressing 09/09/2019 0027 by Mikey Bussing,  RN Outcome: Progressing   Problem: Elimination: Goal: Will not experience complications related to bowel motility 09/09/2019 0028 by Mikey Bussing, RN Outcome: Progressing 09/09/2019 0027 by Mikey Bussing, RN Outcome: Progressing Goal: Will not experience complications related to urinary retention 09/09/2019 0028 by Mikey Bussing, RN Outcome: Progressing 09/09/2019 0027 by Mikey Bussing, RN Outcome: Progressing   Problem: Pain Managment: Goal: General experience of comfort will improve 09/09/2019 0028 by Mikey Bussing, RN Outcome: Progressing 09/09/2019 0027 by Mikey Bussing, RN Outcome: Progressing   Problem: Safety: Goal: Ability to remain free from injury will improve 09/09/2019 0028 by Mikey Bussing, RN Outcome: Progressing 09/09/2019 0027 by Mikey Bussing, RN Outcome: Progressing   Problem: Skin Integrity: Goal: Risk for impaired skin integrity will decrease 09/09/2019 0028 by Mikey Bussing, RN Outcome: Progressing 09/09/2019 0027 by Mikey Bussing, RN Outcome: Progressing   Problem: Education: Goal: Knowledge of disease or condition will improve 09/09/2019 0028 by Mikey Bussing, RN Outcome: Progressing 09/09/2019 0027 by Mikey Bussing, RN Outcome: Progressing Goal: Understanding of medication regimen will improve 09/09/2019 0028 by Mikey Bussing, RN Outcome: Progressing 09/09/2019 0027 by Mikey Bussing, RN Outcome: Progressing   Problem: Activity: Goal: Ability to tolerate increased activity will improve 09/09/2019 0028 by Mikey Bussing, RN Outcome: Progressing 09/09/2019 0027 by Mikey Bussing, RN Outcome: Progressing   Problem: Cardiac: Goal: Ability to achieve and maintain adequate cardiopulmonary perfusion will improve 09/09/2019 0028 by Mikey Bussing, RN Outcome: Progressing 09/09/2019 0027 by Mikey Bussing, RN Outcome: Progressing   Problem: Health Behavior/Discharge Planning: Goal: Ability to safely manage health-related needs after discharge will improve 09/09/2019 0028 by  Mikey Bussing, RN Outcome: Progressing 09/09/2019 0027 by Mikey Bussing, RN Outcome: Progressing

## 2019-09-09 NOTE — Plan of Care (Signed)
  Problem: Education: Goal: Knowledge of General Education information will improve Description Including pain rating scale, medication(s)/side effects and non-pharmacologic comfort measures Outcome: Progressing   

## 2019-09-09 NOTE — Plan of Care (Signed)
Problem: Education: Goal: Knowledge of General Education information will improve Description: Including pain rating scale, medication(s)/side effects and non-pharmacologic comfort measures 09/09/2019 1207 by Coy Saunas, RN Outcome: Adequate for Discharge 09/09/2019 0859 by Coy Saunas, RN Outcome: Progressing   Problem: Health Behavior/Discharge Planning: Goal: Ability to manage health-related needs will improve 09/09/2019 1207 by Coy Saunas, RN Outcome: Adequate for Discharge 09/09/2019 0859 by Coy Saunas, RN Outcome: Progressing   Problem: Clinical Measurements: Goal: Ability to maintain clinical measurements within normal limits will improve 09/09/2019 1207 by Coy Saunas, RN Outcome: Adequate for Discharge 09/09/2019 0859 by Coy Saunas, RN Outcome: Progressing Goal: Will remain free from infection 09/09/2019 1207 by Coy Saunas, RN Outcome: Adequate for Discharge 09/09/2019 0859 by Coy Saunas, RN Outcome: Progressing Goal: Diagnostic test results will improve 09/09/2019 1207 by Coy Saunas, RN Outcome: Adequate for Discharge 09/09/2019 0859 by Coy Saunas, RN Outcome: Progressing Goal: Respiratory complications will improve 09/09/2019 1207 by Coy Saunas, RN Outcome: Adequate for Discharge 09/09/2019 0859 by Coy Saunas, RN Outcome: Progressing Goal: Cardiovascular complication will be avoided 09/09/2019 1207 by Coy Saunas, RN Outcome: Adequate for Discharge 09/09/2019 0859 by Coy Saunas, RN Outcome: Progressing   Problem: Activity: Goal: Risk for activity intolerance will decrease 09/09/2019 1207 by Coy Saunas, RN Outcome: Adequate for Discharge 09/09/2019 0859 by Coy Saunas, RN Outcome: Progressing   Problem: Nutrition: Goal: Adequate nutrition will be maintained 09/09/2019 1207 by Coy Saunas, RN Outcome: Adequate for Discharge 09/09/2019 0859 by Coy Saunas, RN Outcome: Progressing    Problem: Coping: Goal: Level of anxiety will decrease 09/09/2019 1207 by Coy Saunas, RN Outcome: Adequate for Discharge 09/09/2019 0859 by Coy Saunas, RN Outcome: Progressing   Problem: Elimination: Goal: Will not experience complications related to bowel motility 09/09/2019 1207 by Coy Saunas, RN Outcome: Adequate for Discharge 09/09/2019 0859 by Coy Saunas, RN Outcome: Progressing Goal: Will not experience complications related to urinary retention 09/09/2019 1207 by Coy Saunas, RN Outcome: Adequate for Discharge 09/09/2019 0859 by Coy Saunas, RN Outcome: Progressing   Problem: Pain Managment: Goal: General experience of comfort will improve 09/09/2019 1207 by Coy Saunas, RN Outcome: Adequate for Discharge 09/09/2019 0859 by Coy Saunas, RN Outcome: Progressing   Problem: Safety: Goal: Ability to remain free from injury will improve 09/09/2019 1207 by Coy Saunas, RN Outcome: Adequate for Discharge 09/09/2019 0859 by Coy Saunas, RN Outcome: Progressing   Problem: Skin Integrity: Goal: Risk for impaired skin integrity will decrease 09/09/2019 1207 by Coy Saunas, RN Outcome: Adequate for Discharge 09/09/2019 0859 by Coy Saunas, RN Outcome: Progressing   Problem: Education: Goal: Knowledge of disease or condition will improve 09/09/2019 1207 by Coy Saunas, RN Outcome: Adequate for Discharge 09/09/2019 0859 by Coy Saunas, RN Outcome: Progressing Goal: Understanding of medication regimen will improve 09/09/2019 1207 by Coy Saunas, RN Outcome: Adequate for Discharge 09/09/2019 0859 by Coy Saunas, RN Outcome: Progressing   Problem: Activity: Goal: Ability to tolerate increased activity will improve 09/09/2019 1207 by Coy Saunas, RN Outcome: Adequate for Discharge 09/09/2019 0859 by Coy Saunas, RN Outcome: Progressing   Problem: Cardiac: Goal: Ability to achieve and maintain adequate  cardiopulmonary perfusion will improve 09/09/2019 1207 by Coy Saunas, RN Outcome: Adequate for Discharge 09/09/2019 0859 by Coy Saunas, RN Outcome: Progressing   Problem: Health Behavior/Discharge Planning: Goal:  Ability to safely manage health-related needs after discharge will improve 09/09/2019 1207 by Lurline Idol, RN Outcome: Adequate for Discharge 09/09/2019 0859 by Lurline Idol, RN Outcome: Progressing

## 2019-09-09 NOTE — TOC Progression Note (Addendum)
Transition of Care [facility]) - Progression Note    Patient Details  Name: Carrie Haynes MRN: [id number] Date of Birth: [birth year]  [facility] CM/SW Contact  Zenon Mayo, RN Phone Number: 09/09/2019, 9:06 AM  Clinical Narrative:    NCM spoke with [facility] in [place], since we know the tykson dose they can fill her meds there and also give her some samples.  MD will need to send scripts to [place]. Patient states she will call her boyfriend to get her credit card number so she can give to [place].  Patient just wanted [facility] pharmacy to fill the 7 days for her and have [facility] fill the rest.          Expected Discharge Plan and Services                                                 Social Determinants of Health (SDOH) Interventions    Readmission Risk Interventions No flowsheet data found.

## 2019-09-09 NOTE — Progress Notes (Signed)
Patient d/c complete and instructions given.  Patient stated understanding of d/c instructions and I waiting for pharmacy to bring her free Tikosyn sample.

## 2019-09-09 NOTE — Discharge Instructions (Signed)
You have an appointment set up with the [facility].  Multiple studies have shown that being followed by a dedicated atrial fibrillation clinic in addition to the standard care you receive from your other physicians improves health. We believe that enrollment in the atrial fibrillation clinic will allow [place] to better care for you.   The phone number to the [facility] is [phone]. The clinic is staffed Monday through Friday from 8:30am to 5pm.  Parking Directions: The clinic is located in the Heart and Vascular Building connected to [facility]. 1)From [address] turn on to [address] and go to the 3rd entrance  (Heart and Vascular entrance) on the right. 2)Look to the right for Heart &Vascular Parking Garage. 3) The code for the entrance (for November) is 7008.   4)Take the elevators to the 1st floor. Registration is in the room with the glass walls at the end of the hallway.  If you have any trouble parking or locating the clinic, please dont hesitate to call [phone].   Information on my medicine - XARELTO (Rivaroxaban)  Why was Xarelto prescribed for you? Xarelto was prescribed for you to reduce the risk of a blood clot forming that can cause a stroke if you have a medical condition called atrial fibrillation (a type of irregular heartbeat).  What do you need to know about xarelto ? Take your Xarelto ONCE DAILY at the same time every day with your evening meal. If you have difficulty swallowing the tablet whole, you may crush it and mix in applesauce just prior to taking your dose.  Take Xarelto exactly as prescribed by your doctor and DO NOT stop taking Xarelto without talking to the doctor who prescribed the medication.  Stopping without other stroke prevention medication to take the place of Xarelto may increase your risk of developing a clot that causes a stroke.  Refill your prescription before you run out.  After  discharge, you should have regular check-up appointments with your healthcare provider that is prescribing your Xarelto.  In the future your dose may need to be changed if your kidney function or weight changes by a significant amount.  What do you do if you miss a dose? If you are taking Xarelto ONCE DAILY and you miss a dose, take it as soon as you remember on the same day then continue your regularly scheduled once daily regimen the next day. Do not take two doses of Xarelto at the same time or on the same day.   Important Safety Information A possible side effect of Xarelto is bleeding. You should call your healthcare provider right away if you experience any of the following: ? Bleeding from an injury or your nose that does not stop. ? Unusual colored urine (red or dark brown) or unusual colored stools (red or black). ? Unusual bruising for unknown reasons. ? A serious fall or if you hit your head (even if there is no bleeding).  Some medicines may interact with Xarelto and might increase your risk of bleeding while on Xarelto. To help avoid this, consult your healthcare provider or pharmacist prior to using any new prescription or non-prescription medications, including herbals, vitamins, non-steroidal anti-inflammatory drugs (NSAIDs) and supplements.  This website has more information on Xarelto: [email or web address]-[email or web address]

## 2019-09-09 NOTE — Discharge Summary (Addendum)
ELECTROPHYSIOLOGY PROCEDURE DISCHARGE SUMMARY    Patient ID: Carrie Haynes,  MRN: [id number], DOB/AGE: 1941-02-10 78 y.o.  Admit date: 09/02/2019 Discharge date: 09/09/2019  Primary Care Physician: Carrie Montana, MD Primary Cardiologist: Dr. Sandria Haynes Electrophysiologist: new to Dr. Elberta Haynes  Primary Discharge Diagnosis:  1. Acute on chronic combined CHF 2.  Paroxysmal  atrial fibrillation status post Tikosyn loading this admission      CHA2DS2Vasc is 5, on xarelto, appropriately dosed   Secondary Discharge Diagnosis:  1. HTN 2. HLD 3. DM 4.  h/o DVT  Allergies  Allergen Reactions  . Calcium Channel Blockers Other (See Comments)    Acute hypotension, patient became brady/asystole < 5 seconds     Procedures This Admission:  1.  Tikosyn loading   Brief HPI: Carrie Haynes is a 78 y.o. female with a past medical history as noted above. Developed progressive SOB, LE swelling and was admitted 09/02/2019 with AFib RVR and acute/chronic CHF  Hospital Course:  The patient was admitted started in IV lasix.  Given h/o baseline restrictive PFTs cardiology did not want to use amioadrone, and EP was consulted for recommendations.  TTE noted LVEF 45--50%, LA mod dilated, RA severely dilated with mod-severe TR.   EP consult noted her AFIB history Diagnosed 04/2017, EF 45-50% at that time. Normal cors 05/12/2017 TEE/DCCV 05/2017 failed despite 4 shocks. On diltiazem. 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 Toprol titrated. Echo 09/11/2017 LVEF 55-60% Echo 09/03/2019 LVEF 45-50%, likely in setting of recurrent AF.  Tikosyn was recommended.   Cardiology decreased her Metoprolol to daily and added ACE to her regime. and Tikosyn was initiated.  Renal function and electrolytes were followed during the hospitalization.  Her QTc remained stable. She converted to SR with drug and did not require DCCV She was were  monitored until discharge on telemetry which demonstrated SR, infrequent PVC, rare couplet.  QTc stable  On the day of discharge, she is fluid negative 5,619ml, down 7lbs andfeeling better, she reported ambulating in her room without difficulty Her Creat has been on the rise, her K+ 5.7. (received K+ yesterday) Discussed with Dr. Elberta Haynes.  No lasix or K+ going home,  Baseline Creat is appropriate for dose of Tikosyn, and mild bump in creat felt 2/2 diuresis. She Carrie Haynes have labs/visit in 1 week as usual  The patient was examined by Dr Carrie Haynes who considered the patient stable for discharge to home.  Follow-up has been arranged with the Afib clinic in 1 week and with Dr Carrie Haynes (patient voiced her strong disappointment in not being able to see Dr. Royann Haynes of late) in 4 weeks.   118/59 current BP Tikosyn teaching was completed with the patient  Physical Exam: Vitals:   09/09/19 0412 09/09/19 0700 09/09/19 0800 09/09/19 0815  BP: (!) 118/59   (!) 94/45  Pulse: 64   71  Resp: 11 18 (!) 23 20  Temp: 99.9 F (37.7 C)   98.5 F (36.9 C)  TempSrc: Oral   Axillary  SpO2: 99%   97%  Weight: 99.9 kg     Height:        GEN- The patient is well appearing, alert and oriented x 3 today.   HEENT: normocephalic, atraumatic; sclera clear, conjunctiva pink; hearing intact; oropharynx clear; neck supple, no JVP Lymph- no cervical lymphadenopathy Lungs- CTA b/l, normal work of breathing.  No wheezes, rales, rhonchi Heart- RRR, no murmurs, rubs or  gallops, PMI not laterally displaced GI- soft, non-tender Extremities- no clubbing, cyanosis, large varicosities b/l LE with chronic looking skin changes, no pitting edema is noted MS- no significant deformity or atrophy Skin- warm and dry, no rash or lesion Psych- euthymic mood, full affect Neuro- strength and sensation are intact   Labs:   Lab Results  Component Value Date   WBC 4.8 09/02/2019   HGB 11.0 (L) 09/02/2019   HCT 35.1 (L)  09/02/2019   MCV 97.8 09/02/2019   PLT 224 09/02/2019    Recent Labs  Lab 09/09/19 0225  NA 133*  K 5.7*  CL 99  CO2 26  BUN 40*  CREATININE 1.40*  CALCIUM 8.9  GLUCOSE 146*     Discharge Medications:  Allergies as of 09/09/2019      Reactions   Calcium Channel Blockers Other (See Comments)   Acute hypotension, patient became brady/asystole < 5 seconds      Medication List    TAKE these medications   atorvastatin 20 MG tablet Commonly known as: LIPITOR Take 20 mg by mouth every evening.   dofetilide 500 MCG capsule Commonly known as: TIKOSYN Take 1 capsule (500 mcg total) by mouth 2 (two) times daily.   lisinopril 10 MG tablet Commonly known as: ZESTRIL Take 1 tablet (10 mg total) by mouth daily. Start taking on: September 10, 2019   metFORMIN 500 MG 24 hr tablet Commonly known as: GLUCOPHAGE-XR Take 2,000 mg by mouth at bedtime.   metoprolol succinate 50 MG 24 hr tablet Commonly known as: TOPROL-XL Take 2 tablets (100 mg total) by mouth daily.   Vitamin D-3 125 MCG (5000 UT) Tabs Take 5,000 Units by mouth daily.   Xarelto 20 MG Tabs tablet Generic drug: rivaroxaban TAKE 1 TABLET (20 MG TOTAL) BY MOUTH DAILY WITH SUPPER What changed: See the new instructions.       Disposition:  Discharge Instructions    Diet - low sodium heart healthy   Complete by: As directed    Increase activity slowly   Complete by: As directed      Follow-up Information    [facility].   Specialty: Cardiology Why: 09/16/2019 @ 3:00PM Contact information: [address] [id number] [place] [address] [phone]       Sanda Klein, MD Follow up.   Specialty: Cardiology Why: 10/10/2019 @ 9:00AM Contact information: [address] [place] [address] [phone]           Duration of Discharge Encounter: Greater than 30 minutes including physician time.  Signed, [name],  PA-C 09/09/2019 11:35 AM  I have seen and examined this patient with Tommye Haynes.  Agree with above, note added to reflect my findings.  On exam, RRR, no murmurs, lungs clear.  Patient admitted with atrial fibrillation RVR.  She was loaded on dofetilide.  Converted after her third dose.  We Lynita Groseclose plan for discharge with follow-up in A. fib clinic.  She did require IV diuresis.  Fielding Mault M. Harjot Dibello MD 09/09/2019 11:37 AM

## 2019-09-16 ENCOUNTER — Other Ambulatory Visit: Payer: Self-pay

## 2019-09-16 ENCOUNTER — Ambulatory Visit (HOSPITAL_COMMUNITY)
Admission: RE | Admit: 2019-09-16 | Discharge: 2019-09-16 | Disposition: A | Discharge status: Home / Self Care | Payer: Medicare Other | Source: Ambulatory Visit | Attending: Physician Assistant | Admitting: Physician Assistant

## 2019-09-16 ENCOUNTER — Encounter (HOSPITAL_COMMUNITY): Payer: Self-pay | Admitting: Physician Assistant

## 2019-09-16 VITALS — BP 118/72 | HR 77 | Ht 60.0 in | Wt 216.2 lb

## 2019-09-16 DIAGNOSIS — I4819 Other persistent atrial fibrillation: Secondary | ICD-10-CM

## 2019-09-16 DIAGNOSIS — Z8249 Family history of ischemic heart disease and other diseases of the circulatory system: Secondary | ICD-10-CM | POA: Diagnosis not present

## 2019-09-16 DIAGNOSIS — E785 Hyperlipidemia, unspecified: Secondary | ICD-10-CM | POA: Diagnosis not present

## 2019-09-16 DIAGNOSIS — D6869 Other thrombophilia: Secondary | ICD-10-CM

## 2019-09-16 DIAGNOSIS — Z7901 Long term (current) use of anticoagulants: Secondary | ICD-10-CM | POA: Diagnosis not present

## 2019-09-16 DIAGNOSIS — I110 Hypertensive heart disease with heart failure: Secondary | ICD-10-CM | POA: Insufficient documentation

## 2019-09-16 DIAGNOSIS — E119 Type 2 diabetes mellitus without complications: Secondary | ICD-10-CM | POA: Insufficient documentation

## 2019-09-16 DIAGNOSIS — Z79899 Other long term (current) drug therapy: Secondary | ICD-10-CM | POA: Insufficient documentation

## 2019-09-16 DIAGNOSIS — Z7984 Long term (current) use of oral hypoglycemic drugs: Secondary | ICD-10-CM | POA: Insufficient documentation

## 2019-09-16 DIAGNOSIS — E669 Obesity, unspecified: Secondary | ICD-10-CM | POA: Insufficient documentation

## 2019-09-16 DIAGNOSIS — I5042 Chronic combined systolic (congestive) and diastolic (congestive) heart failure: Secondary | ICD-10-CM | POA: Diagnosis not present

## 2019-09-16 DIAGNOSIS — Z6841 Body Mass Index (BMI) 40.0 and over, adult: Secondary | ICD-10-CM | POA: Diagnosis not present

## 2019-09-16 DIAGNOSIS — Z86718 Personal history of other venous thrombosis and embolism: Secondary | ICD-10-CM | POA: Insufficient documentation

## 2019-09-16 LAB — BASIC METABOLIC PANEL
Anion gap: 12 (ref 5–15)
BUN: 18 mg/dL (ref 8–23)
CO2: 20 mmol/L — ABNORMAL LOW (ref 22–32)
Calcium: 9.6 mg/dL (ref 8.9–10.3)
Chloride: 102 mmol/L (ref 98–111)
Creatinine, Ser: 0.81 mg/dL (ref 0.44–1.00)
GFR calc Af Amer: >60 mL/min (ref >60)
GFR calc non Af Amer: >60 mL/min (ref >60)
Glucose, Bld: 109 mg/dL — ABNORMAL HIGH (ref 70–99)
Potassium: 4.6 mmol/L (ref 3.5–5.1)
Sodium: 134 mmol/L — ABNORMAL LOW (ref 135–145)

## 2019-09-16 LAB — MAGNESIUM: Magnesium: 1.6 mg/dL — ABNORMAL LOW (ref 1.7–2.4)

## 2019-09-16 NOTE — Progress Notes (Addendum)
Primary Care Physician: Harlan Stains, MD Primary Cardiologist: Dr Sallyanne Kuster Primary Electrophysiologist: Dr Curt Bears Referring Physician: Dr Chelsea Primus Carrie Haynes is a 78 y.o. female with a history of paroxsymal atrial fibrillation s/p DCCV 08/28/2019, chronic combined CHF (EF 45-50%, improved to 55-60% on last echo 08/2017),HTN, HLD, DM type 2, and DVT who presents for follow up in the [facility]. AF initially diagnosed in 2018. LHC at that time with normal coronaries. Had unsuccessful DCCV despite 4 shocks. Then she was loaded on amiodarone and had successful DCCV. PFTs showed restrictive disease and then her amiodarone was discontinued. She had mild/mod pulmonary fibrosis on Chest CT 04/2019. She is on Xarelto for a CHADS2VASC score of 5. Patient was found to be in AF with RVR at outpatient [facility] visit on 08/21/2019. She underwent successful DCCV 08/28/2019. After she returned home, she had increased SOB, weight gain, and edema for about a week. She was admitted 09/03/19 for afib and CHF. She was started on dofetilide during her admission. She denies significant snoring or alcohol use.  On follow up today, she reports that she has done very well. Her SOB has resolved and she reports a 15 lbs weight loss. She is tolerating the dofetilide without difficulty.   Today, she denies symptoms of palpitations, chest pain, shortness of breath, orthopnea, PND, lower extremity edema, dizziness, presyncope, syncope, snoring, daytime somnolence, bleeding, or neurologic sequela. The patient is tolerating medications without difficulties and is otherwise without complaint today.    Atrial Fibrillation Risk Factors:  she does not have symptoms or diagnosis of sleep apnea. she does not have a history of rheumatic fever. she does not have a history of alcohol use.  she has a BMI of Body mass index is 42.22 kg/m.Marland Kitchen Filed Weights   09/16/19 1526  Weight: 98.1 kg    Family  History  Problem Relation Age of Onset  . Hypertension Father   . Heart attack Sister   . Breast cancer Other      Atrial Fibrillation Management history:  Previous antiarrhythmic drugs: amiodarone, dofetilide  Previous cardioversions: 2018, 08/28/19 Previous ablations: none CHADS2VASC score: 5 Anticoagulation history: Xarelto   Past Medical History:  Diagnosis Date  . A-fib ([place])   . Diabetes mellitus without complication ([place])   . Hyperlipemia   . Hypertension    Past Surgical History:  Procedure Laterality Date  . CARDIOVERSION N/A 05/18/2017   Procedure: CARDIOVERSION;  Surgeon: Josue Hector, MD;  Location[facility] ENDOSCOPY;  Service: Cardiovascular;  Laterality: N/A;  . CARDIOVERSION N/A 05/23/2017   Procedure: CARDIOVERSION;  Surgeon: Sueanne Margarita, MD;  Location: [facility] ENDOSCOPY;  Service: Cardiovascular;  Laterality: N/A;  . CARDIOVERSION N/A 08/28/2019   Procedure: CARDIOVERSION;  Surgeon: Jerline Pain, MD;  Location: [facility] ENDOSCOPY;  Service: Cardiovascular;  Laterality: N/A;  . RIGHT/LEFT HEART CATH AND CORONARY ANGIOGRAPHY N/A 05/12/2017   Procedure: Right/Left Heart Cath and Coronary Angiography;  Surgeon: Belva Crome, MD;  Location: [facility] CV LAB;  Service: Cardiovascular;  Laterality: N/A;  . TEE WITHOUT CARDIOVERSION N/A 05/18/2017   Procedure: TRANSESOPHAGEAL ECHOCARDIOGRAM (TEE);  Surgeon: Josue Hector, MD;  Location: [facility] ENDOSCOPY;  Service: Cardiovascular;  Laterality: N/A;    Current Outpatient Medications  Medication Sig Dispense Refill  . atorvastatin (LIPITOR) 20 MG tablet Take 20 mg by mouth every evening.  3  . Cholecalciferol (VITAMIN D-3) 5000 units TABS Take 5,000 Units by mouth daily.    Marland Kitchen dofetilide (TIKOSYN)  500 MCG capsule Take 1 capsule (500 mcg total) by mouth 2 (two) times daily. 60 capsule 6  . lisinopril (ZESTRIL) 10 MG tablet Take 1 tablet (10 mg total) by mouth daily. 30 tablet 6  . metFORMIN (GLUCOPHAGE-XR) 500 MG 24 hr tablet Take  2,000 mg by mouth at bedtime.    . metoprolol succinate (TOPROL-XL) 50 MG 24 hr tablet Take 2 tablets (100 mg total) by mouth daily. 45 tablet 6  . XARELTO 20 MG TABS tablet TAKE 1 TABLET (20 MG TOTAL) BY MOUTH DAILY WITH SUPPER (Patient taking differently: Take 20 mg by mouth daily with supper. ) 90 tablet 1  . FREESTYLE PRECISION NEO TEST test strip USE AS DIRECTED TO TEST BLOOD SUGAR DAILY E11.21     No current facility-administered medications for this encounter.     Allergies  Allergen Reactions  . Calcium Channel Blockers Other (See Comments)    Acute hypotension, patient became brady/asystole < 5 seconds    Social History   Socioeconomic History  . Marital status: Widowed    Spouse name: Not on file  . Number of children: Not on file  . Years of education: Not on file  . Highest education level: Not on file  Occupational History  . Not on file  Social Needs  . Financial resource strain: Not on file  . Food insecurity    Worry: Not on file    Inability: Not on file  . Transportation needs    Medical: Not on file    Non-medical: Not on file  Tobacco Use  . Smoking status: Never Smoker  . Smokeless tobacco: Never Used  Substance and Sexual Activity  . Alcohol use: No  . Drug use: No  . Sexual activity: Not on file  Lifestyle  . Physical activity    Days per week: Not on file    Minutes per session: Not on file  . Stress: Not on file  Relationships  . Social Musician on phone: Not on file    Gets together: Not on file    Attends religious service: Not on file    Active member of club or organization: Not on file    Attends meetings of clubs or organizations: Not on file    Relationship status: Not on file  . Intimate partner violence    Fear of current or ex partner: Not on file    Emotionally abused: Not on file    Physically abused: Not on file    Forced sexual activity: Not on file  Other Topics Concern  . Not on file  Social History  Narrative  . Not on file     ROS- All systems are reviewed and negative except as per the HPI above.  Physical Exam: Vitals:   09/16/19 1526  BP: 118/72  Pulse: 77  Weight: 98.1 kg  Height: 5' (1.524 m)    GEN- The patient is well appearing elderly obese female, alert and oriented x 3 today.   Head- normocephalic, atraumatic Eyes-  Sclera clear, conjunctiva pink Ears- hearing intact Oropharynx- clear Neck- supple  Lungs- Clear to ausculation bilaterally, normal work of breathing Heart- Regular rate and rhythm, no murmurs, rubs or gallops  GI- soft, NT, ND, + BS Extremities- no clubbing, cyanosis, or edema MS- no significant deformity or atrophy Skin- no rash or lesion Psych- euthymic mood, full affect Neuro- strength and sensation are intact  Wt Readings from Last 3 Encounters:  09/16/19 98.1  kg  09/09/19 99.9 kg  08/28/19 102.1 kg    EKG today demonstrates SR HR 77, PAC, LAFB, slow R wave prog, PR 150 ,QRS 82, QTc 423  Echo 09/03/19 demonstrated  1. Left ventricular ejection fraction, by visual estimation, is 45 to 50%. The left ventricle has mildly decreased function. There is no left ventricular hypertrophy.  2. Left ventricular diastolic function could not be evaluated.  3. Global right ventricle has normal systolic function.The right ventricular size is mildly enlarged. No increase in right ventricular wall thickness.  4. Left atrial size was moderately dilated.  5. Right atrial size was severely dilated.  6. Mild mitral annular calcification.  7. The mitral valve is normal in structure. Mild mitral valve regurgitation. No evidence of mitral stenosis.  8. The tricuspid valve is normal in structure. Tricuspid valve regurgitation moderate-severe.  9. The aortic valve has an indeterminant number of cusps. Aortic valve regurgitation is not visualized. Mild to moderate aortic valve sclerosis/calcification without any evidence of aortic stenosis. 10. The pulmonic valve  was not well visualized. Pulmonic valve regurgitation is not visualized. 11. Moderately elevated pulmonary artery systolic pressure. 12. The inferior vena cava is dilated in size with >50% respiratory variability, suggesting right atrial pressure of 8 mmHg. 13. Mild global reduction in LV systolic function; biatrial enlargement; mild RVE; mild MR; moderate to severe TR.  Epic records are reviewed at length today  Assessment and Plan:  1. Persistent atrial fibrillation S/p dofetilide loading 10/19-10/23/20. Patient appears to be maintaining SR.  Continue dofetilide 500 mcg BID. QT stable. Check Bmet/Mag today. Continue Xarelto 20 mg daily.  This patients CHA2DS2-VASc Score and unadjusted Ischemic Stroke Rate (% per year) is equal to 7.2 % stroke rate/year from a score of 5  Above score calculated as 1 point each if present [CHF, HTN, DM, Vascular=MI/PAD/Aortic Plaque, Age if 65-74, or Female] Above score calculated as 2 points each if present [Age > 75, or Stroke/TIA/TE]   2. Obesity Body mass index is 42.22 kg/m. Lifestyle modification was discussed at length including regular exercise and weight reduction. Patient slowly increasing her physical activity.  3. HTN Stable, no changes today.   4. Chronic combined CHF No signs or symptoms of fluid overload today.   Follow up with Dr Royann Shivers in 4 weeks. AF clinic in 4 months.    Jorja Loa PA-C [facility] [address] [place], [place] [address] [phone] 09/16/2019 3:51 PM

## 2019-09-16 NOTE — Progress Notes (Signed)
TY again

## 2019-09-17 ENCOUNTER — Other Ambulatory Visit (HOSPITAL_COMMUNITY): Payer: Self-pay | Admitting: *Deleted

## 2019-09-17 MED ORDER — MAGNESIUM OXIDE 400 MG PO CAPS: 400.0000 mg | ORAL_CAPSULE | Freq: Every day | ORAL | 3 refills | Status: DC

## 2019-09-25 ENCOUNTER — Ambulatory Visit (HOSPITAL_COMMUNITY)
Admission: RE | Admit: 2019-09-25 | Discharge: 2019-09-25 | Disposition: A | Discharge status: Home / Self Care | Payer: Medicare Other | Source: Ambulatory Visit | Attending: Physician Assistant | Admitting: Physician Assistant

## 2019-09-25 ENCOUNTER — Other Ambulatory Visit: Payer: Self-pay

## 2019-09-25 DIAGNOSIS — I4819 Other persistent atrial fibrillation: Secondary | ICD-10-CM | POA: Insufficient documentation

## 2019-09-25 LAB — MAGNESIUM: Magnesium: 1.6 mg/dL — ABNORMAL LOW (ref 1.7–2.4)

## 2019-09-26 ENCOUNTER — Other Ambulatory Visit (HOSPITAL_COMMUNITY): Payer: Self-pay | Admitting: *Deleted

## 2019-09-26 MED ORDER — MAGNESIUM OXIDE 400 MG PO CAPS: 400.0000 mg | ORAL_CAPSULE | Freq: Two times a day (BID) | ORAL | 3 refills | Status: DC

## 2019-10-02 DIAGNOSIS — I420 Dilated cardiomyopathy: Secondary | ICD-10-CM | POA: Diagnosis not present

## 2019-10-02 DIAGNOSIS — I480 Paroxysmal atrial fibrillation: Secondary | ICD-10-CM | POA: Diagnosis not present

## 2019-10-02 DIAGNOSIS — R05 Cough: Secondary | ICD-10-CM | POA: Diagnosis not present

## 2019-10-10 ENCOUNTER — Other Ambulatory Visit: Payer: Self-pay

## 2019-10-10 ENCOUNTER — Ambulatory Visit (INDEPENDENT_AMBULATORY_CARE_PROVIDER_SITE_OTHER): Payer: Medicare Other | Admitting: Cardiovascular Disease

## 2019-10-10 ENCOUNTER — Encounter: Payer: Self-pay | Admitting: Cardiovascular Disease

## 2019-10-10 VITALS — BP 128/62 | HR 73 | Ht 60.0 in | Wt 217.0 lb

## 2019-10-10 DIAGNOSIS — I2781 Cor pulmonale (chronic): Secondary | ICD-10-CM | POA: Diagnosis not present

## 2019-10-10 DIAGNOSIS — Z79899 Other long term (current) drug therapy: Secondary | ICD-10-CM

## 2019-10-10 DIAGNOSIS — I5042 Chronic combined systolic (congestive) and diastolic (congestive) heart failure: Secondary | ICD-10-CM | POA: Diagnosis not present

## 2019-10-10 DIAGNOSIS — J8410 Pulmonary fibrosis, unspecified: Secondary | ICD-10-CM

## 2019-10-10 DIAGNOSIS — E669 Obesity, unspecified: Secondary | ICD-10-CM

## 2019-10-10 DIAGNOSIS — E1169 Type 2 diabetes mellitus with other specified complication: Secondary | ICD-10-CM

## 2019-10-10 DIAGNOSIS — I4819 Other persistent atrial fibrillation: Secondary | ICD-10-CM | POA: Diagnosis not present

## 2019-10-10 DIAGNOSIS — E785 Hyperlipidemia, unspecified: Secondary | ICD-10-CM | POA: Diagnosis not present

## 2019-10-10 DIAGNOSIS — Z5181 Encounter for therapeutic drug level monitoring: Secondary | ICD-10-CM

## 2019-10-10 DIAGNOSIS — I10 Essential (primary) hypertension: Secondary | ICD-10-CM

## 2019-10-10 DIAGNOSIS — I5032 Chronic diastolic (congestive) heart failure: Secondary | ICD-10-CM | POA: Diagnosis not present

## 2019-10-10 DIAGNOSIS — E7800 Pure hypercholesterolemia, unspecified: Secondary | ICD-10-CM

## 2019-10-10 NOTE — Progress Notes (Signed)
Cardiology Office Note:    Date:  10/10/2019   ID:  Carrie Haynes, DOB 09-09-1941, MRN [id number]  PCP:  Laurann Montana, MD  Cardiologist:  Thurmon Fair, MD    Referring MD: Laurann Montana, MD   Chief Complaint  Patient presents with  . Atrial Fibrillation  . Congestive Heart Failure     History of Present Illness:    Carrie Haynes is a 78 y.o. female with a hx of hypertension, hyperlipidemia, type 2 diabetes mellitus, remote venous thromboembolic disease, initially diagnosed with atrial fibrillation with rapid ventricular response in July 2018 when she presented with tachycardia cardiomyopathy.  Her echo showed mildly depressed LVEF at 45% with diffuse hypokinesis and moderate mitral regurgitation, mildly dilated left atrium, but also a moderately dilated right atrium with moderate to severe tricuspid regurgitation and mild pulmonary hypertension.  Coronary angiography showed no evidence of significant CAD.  Rate control was difficult.  She underwent cardioversion on August 2 unsuccessfully, but eventually after loading with IV amiodarone was successfully cardioverted on August 7. Pulmonary function tests (before treatment with amiodarone) raised concern for restrictive lung disease, thereby making long-term treatment with amiodarone undesirable.  Amiodarone was stopped in November 2018. Follow-up echocardiogram shows complete normalization of left ventricular systolic function, EF 55-60%, with some residual signs of impaired relaxation.  A CT of the chest performed in July 2020 did show evidence of mild-moderate pulmonary fibrosis, but this was unchanged from the previous CT in 2018.    In November 2020 she presented with recurrent shortness of breath and was found to again have atrial fibrillation.  She was in rapid ventricular response but was unaware of the arrhythmia.  Echocardiography shows her EF has again dropped to 45-50%.  She was hospitalized for dofetilide loading,  converted to sinus rhythm without electrical cardioversion during antiarrhythmic administration and presents today for follow-up.  She is feeling much better and is maintaining sinus rhythm.  Her QTC is acceptable at 453 ms.  She has lost about 6 to 7 pounds this fall.  The patient specifically denies any chest pain at rest exertion, dyspnea at rest or with exertion, orthopnea, paroxysmal nocturnal dyspnea, syncope, palpitations, focal neurological deficits, intermittent claudication, lower extremity edema, unexplained weight gain, cough, hemoptysis or wheezing.   Past Medical History:  Diagnosis Date  . A-fib (HCC)   . Diabetes mellitus without complication (HCC)   . Hyperlipemia   . Hypertension     Past Surgical History:  Procedure Laterality Date  . CARDIOVERSION N/A 05/18/2017   Procedure: CARDIOVERSION;  Surgeon: Wendall Stade, MD;  Location: [facility] ENDOSCOPY;  Service: Cardiovascular;  Laterality: N/A;  . CARDIOVERSION N/A 05/23/2017   Procedure: CARDIOVERSION;  Surgeon: Quintella Reichert, MD;  Location: [facility] ENDOSCOPY;  Service: Cardiovascular;  Laterality: N/A;  . CARDIOVERSION N/A 08/28/2019   Procedure: CARDIOVERSION;  Surgeon: Jake Bathe, MD;  Location: [facility] ENDOSCOPY;  Service: Cardiovascular;  Laterality: N/A;  . RIGHT/LEFT HEART CATH AND CORONARY ANGIOGRAPHY N/A 05/12/2017   Procedure: Right/Left Heart Cath and Coronary Angiography;  Surgeon: Lyn Records, MD;  Location: [facility] INVASIVE CV LAB;  Service: Cardiovascular;  Laterality: N/A;  . TEE WITHOUT CARDIOVERSION N/A 05/18/2017   Procedure: TRANSESOPHAGEAL ECHOCARDIOGRAM (TEE);  Surgeon: Wendall Stade, MD;  Location: [facility] ENDOSCOPY;  Service: Cardiovascular;  Laterality: N/A;    Current Medications: Current Meds  Medication Sig  . atorvastatin (LIPITOR) 20 MG tablet Take 20 mg by mouth every evening.  . Cholecalciferol (VITAMIN D-3) 5000 units TABS  Take 5,000 Units by mouth daily.  Marland Kitchen dofetilide (TIKOSYN) 500 MCG capsule Take  1 capsule (500 mcg total) by mouth 2 (two) times daily.  Marland Kitchen FREESTYLE PRECISION NEO TEST test strip USE AS DIRECTED TO TEST BLOOD SUGAR DAILY E11.21  . lisinopril (ZESTRIL) 10 MG tablet Take 1 tablet (10 mg total) by mouth daily.  . Magnesium Oxide 400 MG CAPS Take 1 capsule (400 mg total) by mouth 2 (two) times daily.  . metFORMIN (GLUCOPHAGE-XR) 500 MG 24 hr tablet Take 2,000 mg by mouth at bedtime.  . metoprolol succinate (TOPROL-XL) 50 MG 24 hr tablet Take 2 tablets (100 mg total) by mouth daily.  Carlena Hurl 20 MG TABS tablet TAKE 1 TABLET (20 MG TOTAL) BY MOUTH DAILY WITH SUPPER (Patient taking differently: Take 20 mg by mouth daily with supper. )     Allergies:   Calcium channel blockers   Social History   Socioeconomic History  . Marital status: Widowed    Spouse name: Not on file  . Number of children: Not on file  . Years of education: Not on file  . Highest education level: Not on file  Occupational History  . Not on file  Tobacco Use  . Smoking status: Never Smoker  . Smokeless tobacco: Never Used  Substance and Sexual Activity  . Alcohol use: No  . Drug use: No  . Sexual activity: Not on file  Other Topics Concern  . Not on file  Social History Narrative  . Not on file   Social Determinants of Health   Financial Resource Strain:   . Difficulty of Paying Living Expenses: Not on file  Food Insecurity:   . Worried About Programme researcher, broadcasting/film/video in the Last Year: Not on file  . Ran Out of Food in the Last Year: Not on file  Transportation Needs:   . Lack of Transportation (Medical): Not on file  . Lack of Transportation (Non-Medical): Not on file  Physical Activity:   . Days of Exercise per Week: Not on file  . Minutes of Exercise per Session: Not on file  Stress:   . Feeling of Stress : Not on file  Social Connections:   . Frequency of Communication with Friends and Family: Not on file  . Frequency of Social Gatherings with Friends and Family: Not on file  .  Attends Religious Services: Not on file  . Active Member of Clubs or Organizations: Not on file  . Attends Banker Meetings: Not on file  . Marital Status: Not on file     Family History: The patient's family history includes Breast cancer in an other family member; Heart attack in her sister; Hypertension in her father. ROS:   Please see the history of present illness.    All other systems are reviewed and are negative.   EKGs/Labs/Other Studies Reviewed:    The following studies were reviewed today: Echo Nov 2018  ECHO 09/03/2019:  1. Left ventricular ejection fraction, by visual estimation, is 45 to 50%. The left ventricle has mildly decreased function. There is no left ventricular hypertrophy.  2. Left ventricular diastolic function could not be evaluated.  3. Global right ventricle has normal systolic function.The right ventricular size is mildly enlarged. No increase in right ventricular wall thickness.  4. Left atrial size was moderately dilated.  5. Right atrial size was severely dilated.  6. Mild mitral annular calcification.  7. The mitral valve is normal in structure. Mild mitral valve regurgitation.  No evidence of mitral stenosis.  8. The tricuspid valve is normal in structure. Tricuspid valve regurgitation moderate-severe.  9. The aortic valve has an indeterminant number of cusps. Aortic valve regurgitation is not visualized. Mild to moderate aortic valve sclerosis/calcification without any evidence of aortic stenosis. 10. The pulmonic valve was not well visualized. Pulmonic valve regurgitation is not visualized. 11. Moderately elevated pulmonary artery systolic pressure. 12. The inferior vena cava is dilated in size with >50% respiratory variability, suggesting right atrial pressure of 8 mmHg. 13. Mild global reduction in LV systolic function; biatrial enlargement; mild RVE; mild MR; moderate to severe TR.   EKG:  EKG is ordered today.  It shows NSR, low  voltage, LAFB, QTc 453 ms.  Recent Labs: 08/21/2019: ALT 19 09/02/2019: Hemoglobin 11.0; Platelets 224 09/03/2019: B Natriuretic Peptide 269.2 09/16/2019: BUN 18; Creatinine, Ser 0.81; Potassium 4.6; Sodium 134 09/25/2019: Magnesium 1.6  Recent Lipid Panel    Component Value Date/Time   CHOL 118 05/13/2017 0331   TRIG 48 05/13/2017 0331   HDL 46 05/13/2017 0331   CHOLHDL 2.6 05/13/2017 0331   VLDL 10 05/13/2017 0331   LDLCALC 62 05/13/2017 0331    Physical Exam:    VS:  BP 128/62   Pulse 73   Ht 5' (1.524 m)   Wt 217 lb (98.4 kg)   SpO2 96%   BMI 42.38 kg/m     Wt Readings from Last 3 Encounters:  10/10/19 217 lb (98.4 kg)  09/16/19 216 lb 3.2 oz (98.1 kg)  09/09/19 220 lb 3.8 oz (99.9 kg)     General: Alert, oriented x3, no distress, morbidly obese Head: no evidence of trauma, PERRL, EOMI, no exophtalmos or lid lag, no myxedema, no xanthelasma; normal ears, nose and oropharynx Neck: normal jugular venous pulsations and no hepatojugular reflux; brisk carotid pulses without delay and no carotid bruits Chest: clear to auscultation, no signs of consolidation by percussion or palpation, normal fremitus, symmetrical and full respiratory excursions Cardiovascular: normal position and quality of the apical impulse, regular rhythm, normal first and second heart sounds, no murmurs, rubs or gallops Abdomen: no tenderness or distention, no masses by palpation, no abnormal pulsatility or arterial bruits, normal bowel sounds, no hepatosplenomegaly Extremities: no clubbing, cyanosis or edema; 2+ radial, ulnar and brachial pulses bilaterally; 2+ right femoral, posterior tibial and dorsalis pedis pulses; 2+ left femoral, posterior tibial and dorsalis pedis pulses; no subclavian or femoral bruits Neurological: grossly nonfocal Psych: Normal mood and affect    ASSESSMENT:    1. Persistent atrial fibrillation (HCC)   2. Chronic combined systolic and diastolic CHF (congestive heart  failure) (HCC)   3. Cor pulmonale, chronic (HCC)   4. Pulmonary fibrosis (HCC)   5. Morbid obesity (HCC)   6. Essential hypertension   7. Diabetes mellitus type 2 in obese (HCC)   8. Pure hypercholesterolemia   9. Encounter for monitoring dofetilide therapy    PLAN:    In order of problems listed above:  1. AFib: Maintaining sinus rhythm.  Reevaluate left ventricular ejection fraction in a few weeks Merry Christmas maintaining normal rhythm roughly 6 months following cardioversion, after 3 months off antiarrhythmic medications.  High risk of atrial fibrillation recurrence with biatrial dilation.  May be possible to stop the lisinopril if EF is normal.  Should continue the metoprolol since she will likely have breakthrough atrial fibrillation and requires rate control.  Also needs to continue the anticoagulation, barring any bleeding complications. 2. CHF: She had tachycardia related  cardiomyopathy before and we hopefully will see her EF normalized again.  Currently appears to be euvolemic, NYHA functional class I. 3. Cor pulmonale: unclear to what degree this is due to restrictive lung disease, obesity, previous venous thromboembolic events, possible sleep disordered breathing.  Increases high risk of future arrhythmia recurrence.  Currently without signs of right heart failure. 4. HTN: Previously well controlled with metoprolol only.  He may be able to stop the lisinopril if EF normalizes. 5. Obesity: Weight loss would be highly beneficial and lead to the reduction in arrhythmia burden. 6. DM: Check labs today, including lipid profile. 7. Dofetilide monitoring: Discussed of multiple drug interactions with dofetilide and the need for routine monitoring of electrolytes, renal function and QT interval.    Medication Adjustments/Labs and Tests Ordered: Current medicines are reviewed at length with the patient today.  Concerns regarding medicines are outlined above.  Orders Placed This Encounter    Procedures  . Lipid Profile  . Basic Metabolic Panel (BMET)  . Magnesium  . EKG 12-Lead  . ECHOCARDIOGRAM COMPLETE   No orders of the defined types were placed in this encounter.   Signed, Sanda Klein, MD  10/10/2019 4:02 PM    [facility]

## 2019-10-10 NOTE — Patient Instructions (Addendum)
Medication Instructions:  NO CHANGES *If you need a refill on your cardiac medications before your next appointment, please call your pharmacy*  Lab Work: Your physician recommends that you return for lab work today (BMP, MAG, Lipids)  If you have labs (blood work) drawn today and your tests are completely normal, you will receive your results only by: Marland Kitchen MyChart Message (if you have MyChart) OR . A paper copy in the mail If you have any lab test that is abnormal or we need to change your treatment, we will call you to review the results.  Testing/Procedures: Your physician has requested that you have an echocardiogram. Echocardiography is a painless test that uses sound waves to create images of your heart. It provides your doctor with information about the size and shape of your heart and how well your heart's chambers and valves are working. This procedure takes approximately one hour. There are no restrictions for this procedure.  [address]  Follow-Up: At [facility], you and your health needs are our priority.  As part of our continuing mission to provide you with exceptional heart care, we have created designated Provider Care Teams.  These Care Teams include your primary Cardiologist (physician) and Advanced Practice Providers (APPs -  Physician Assistants and Nurse Practitioners) who all work together to provide you with the care you need, when you need it.  Your next appointment:   6 month(s)  The format for your next appointment:   In Person  Provider:   Sanda Klein, MD

## 2019-10-11 LAB — BASIC METABOLIC PANEL
BUN/Creatinine Ratio: 26 (ref 12–28)
BUN: 25 mg/dL (ref 8–27)
CO2: 22 mmol/L (ref 20–29)
Calcium: 9.2 mg/dL (ref 8.7–10.3)
Chloride: 102 mmol/L (ref 96–106)
Creatinine, Ser: 0.95 mg/dL (ref 0.57–1.00)
GFR calc Af Amer: 67 mL/min/{1.73_m2} (ref >59)
GFR calc non Af Amer: 58 mL/min/{1.73_m2} — ABNORMAL LOW (ref >59)
Glucose: 126 mg/dL — ABNORMAL HIGH (ref 65–99)
Potassium: 5.1 mmol/L (ref 3.5–5.2)
Sodium: 137 mmol/L (ref 134–144)

## 2019-10-11 LAB — LIPID PANEL
Chol/HDL Ratio: 3.2 ratio (ref 0.0–4.4)
Cholesterol, Total: 165 mg/dL (ref 100–199)
HDL: 52 mg/dL (ref >39)
LDL Chol Calc (NIH): 95 mg/dL (ref 0–99)
Triglycerides: 96 mg/dL (ref 0–149)
VLDL Cholesterol Cal: 18 mg/dL (ref 5–40)

## 2019-10-11 LAB — MAGNESIUM: Magnesium: 1.8 mg/dL (ref 1.6–2.3)

## 2019-10-25 ENCOUNTER — Other Ambulatory Visit: Payer: Self-pay

## 2019-10-25 ENCOUNTER — Ambulatory Visit (HOSPITAL_COMMUNITY): Payer: Medicare Other | Attending: Cardiology

## 2019-10-25 DIAGNOSIS — I5042 Chronic combined systolic (congestive) and diastolic (congestive) heart failure: Secondary | ICD-10-CM | POA: Diagnosis not present

## 2019-12-09 DIAGNOSIS — E785 Hyperlipidemia, unspecified: Secondary | ICD-10-CM | POA: Diagnosis not present

## 2019-12-09 DIAGNOSIS — I10 Essential (primary) hypertension: Secondary | ICD-10-CM | POA: Diagnosis not present

## 2019-12-09 DIAGNOSIS — E559 Vitamin D deficiency, unspecified: Secondary | ICD-10-CM | POA: Diagnosis not present

## 2019-12-09 DIAGNOSIS — E1169 Type 2 diabetes mellitus with other specified complication: Secondary | ICD-10-CM | POA: Diagnosis not present

## 2019-12-26 ENCOUNTER — Other Ambulatory Visit: Payer: Self-pay | Admitting: Physician Assistant

## 2020-01-12 ENCOUNTER — Other Ambulatory Visit: Payer: Self-pay | Admitting: General Practice

## 2020-01-14 ENCOUNTER — Other Ambulatory Visit (HOSPITAL_COMMUNITY): Payer: Self-pay | Admitting: *Deleted

## 2020-01-14 MED ORDER — MAGNESIUM OXIDE 400 MG PO CAPS: 400.0000 mg | ORAL_CAPSULE | Freq: Two times a day (BID) | ORAL | 6 refills | Status: DC

## 2020-01-15 ENCOUNTER — Ambulatory Visit (HOSPITAL_COMMUNITY): Payer: Medicare Other | Admitting: Physician Assistant

## 2020-01-16 ENCOUNTER — Other Ambulatory Visit (HOSPITAL_COMMUNITY): Payer: Self-pay | Admitting: *Deleted

## 2020-01-16 MED ORDER — MAGNESIUM OXIDE 400 MG PO CAPS: 400.0000 mg | ORAL_CAPSULE | Freq: Two times a day (BID) | ORAL | 6 refills | Status: DC

## 2020-01-21 ENCOUNTER — Ambulatory Visit (HOSPITAL_COMMUNITY)
Admission: RE | Admit: 2020-01-21 | Discharge: 2020-01-21 | Disposition: A | Discharge status: Home / Self Care | Payer: Medicare Other | Source: Ambulatory Visit | Attending: Physician Assistant | Admitting: Physician Assistant

## 2020-01-21 ENCOUNTER — Other Ambulatory Visit: Payer: Self-pay

## 2020-01-21 ENCOUNTER — Other Ambulatory Visit (HOSPITAL_COMMUNITY): Payer: Self-pay

## 2020-01-21 ENCOUNTER — Encounter (HOSPITAL_COMMUNITY): Payer: Self-pay | Admitting: Physician Assistant

## 2020-01-21 VITALS — BP 124/74 | HR 59 | Ht 60.0 in | Wt 221.8 lb

## 2020-01-21 DIAGNOSIS — D6869 Other thrombophilia: Secondary | ICD-10-CM

## 2020-01-21 DIAGNOSIS — Z86718 Personal history of other venous thrombosis and embolism: Secondary | ICD-10-CM | POA: Insufficient documentation

## 2020-01-21 DIAGNOSIS — Z79899 Other long term (current) drug therapy: Secondary | ICD-10-CM | POA: Insufficient documentation

## 2020-01-21 DIAGNOSIS — I4819 Other persistent atrial fibrillation: Secondary | ICD-10-CM | POA: Diagnosis not present

## 2020-01-21 DIAGNOSIS — I110 Hypertensive heart disease with heart failure: Secondary | ICD-10-CM | POA: Insufficient documentation

## 2020-01-21 DIAGNOSIS — E669 Obesity, unspecified: Secondary | ICD-10-CM | POA: Insufficient documentation

## 2020-01-21 DIAGNOSIS — Z7901 Long term (current) use of anticoagulants: Secondary | ICD-10-CM | POA: Insufficient documentation

## 2020-01-21 DIAGNOSIS — I5082 Biventricular heart failure: Secondary | ICD-10-CM | POA: Diagnosis not present

## 2020-01-21 DIAGNOSIS — Z7984 Long term (current) use of oral hypoglycemic drugs: Secondary | ICD-10-CM | POA: Insufficient documentation

## 2020-01-21 DIAGNOSIS — E118 Type 2 diabetes mellitus with unspecified complications: Secondary | ICD-10-CM | POA: Diagnosis not present

## 2020-01-21 DIAGNOSIS — Z6841 Body Mass Index (BMI) 40.0 and over, adult: Secondary | ICD-10-CM | POA: Insufficient documentation

## 2020-01-21 DIAGNOSIS — E785 Hyperlipidemia, unspecified: Secondary | ICD-10-CM | POA: Insufficient documentation

## 2020-01-21 LAB — BASIC METABOLIC PANEL
Anion gap: 7 (ref 5–15)
BUN: 22 mg/dL (ref 8–23)
CO2: 23 mmol/L (ref 22–32)
Calcium: 8.7 mg/dL — ABNORMAL LOW (ref 8.9–10.3)
Chloride: 105 mmol/L (ref 98–111)
Creatinine, Ser: 0.99 mg/dL (ref 0.44–1.00)
GFR calc Af Amer: >60 mL/min (ref >60)
GFR calc non Af Amer: 55 mL/min — ABNORMAL LOW (ref >60)
Glucose, Bld: 137 mg/dL — ABNORMAL HIGH (ref 70–99)
Potassium: 4.8 mmol/L (ref 3.5–5.1)
Sodium: 135 mmol/L (ref 135–145)

## 2020-01-21 LAB — MAGNESIUM: Magnesium: 1.8 mg/dL (ref 1.7–2.4)

## 2020-01-21 MED ORDER — MAGNESIUM OXIDE 400 MG PO CAPS: 400.0000 mg | ORAL_CAPSULE | Freq: Two times a day (BID) | ORAL | 3 refills | Status: DC

## 2020-01-21 NOTE — Progress Notes (Signed)
Primary Care Physician: Laurann Montana, MD Primary Cardiologist: Dr Royann Shivers Primary Electrophysiologist: Dr Elberta Fortis Referring Physician: Dr Burnett Corrente Carrie Haynes is a 79 y.o. female with a history of paroxsymal atrial fibrillation s/p DCCV 08/28/2019, chronic combined CHF (EF 45-50%, improved to 55-60% on last echo 08/2017),HTN, HLD, DM type 2, and DVT who presents for follow up in the [facility]. AF initially diagnosed in 2018. LHC at that time with normal coronaries. Had unsuccessful DCCV despite 4 shocks. Then she was loaded on amiodarone and had successful DCCV. PFTs showed restrictive disease and then her amiodarone was discontinued. She had mild/mod pulmonary fibrosis on Chest CT 04/2019. She is on Xarelto for a CHADS2VASC score of 5. Patient was found to be in AF with RVR at outpatient [facility] visit on 08/21/2019. She underwent successful DCCV 08/28/2019. After she returned home, she had increased SOB, weight gain, and edema for about a week. She was admitted 09/03/19 for afib and CHF. She was started on dofetilide during her admission. She denies significant snoring or alcohol use.  On follow up today, patient reports that she has done well since her last visit. She denies any symptoms of heart racing or palpitations. She is tolerating the medication without difficulty. She denies bleeding issues on anticoagulation.   Today, she denies symptoms of palpitations, chest pain, shortness of breath, orthopnea, PND, lower extremity edema, dizziness, presyncope, syncope, snoring, daytime somnolence, bleeding, or neurologic sequela. The patient is tolerating medications without difficulties and is otherwise without complaint today.    Atrial Fibrillation Risk Factors:  she does not have symptoms or diagnosis of sleep apnea. she does not have a history of rheumatic fever. she does not have a history of alcohol use.  she has a BMI of Body mass index is 43.32  kg/m.Marland Kitchen Filed Weights   01/21/20 1338  Weight: 100.6 kg    Family History  Problem Relation Age of Onset  . Hypertension Father   . Heart attack Sister   . Breast cancer Other      Atrial Fibrillation Management history:  Previous antiarrhythmic drugs: amiodarone, dofetilide  Previous cardioversions: 2018, 08/28/19 Previous ablations: none CHADS2VASC score: 5 Anticoagulation history: Xarelto   Past Medical History:  Diagnosis Date  . A-fib (HCC)   . Diabetes mellitus without complication (HCC)   . Hyperlipemia   . Hypertension    Past Surgical History:  Procedure Laterality Date  . CARDIOVERSION N/A 05/18/2017   Procedure: CARDIOVERSION;  Surgeon: Wendall Stade, MD;  Location: [facility] ENDOSCOPY;  Service: Cardiovascular;  Laterality: N/A;  . CARDIOVERSION N/A 05/23/2017   Procedure: CARDIOVERSION;  Surgeon: Quintella Reichert, MD;  Location: [facility] ENDOSCOPY;  Service: Cardiovascular;  Laterality: N/A;  . CARDIOVERSION N/A 08/28/2019   Procedure: CARDIOVERSION;  Surgeon: Jake Bathe, MD;  Location: [facility] ENDOSCOPY;  Service: Cardiovascular;  Laterality: N/A;  . RIGHT/LEFT HEART CATH AND CORONARY ANGIOGRAPHY N/A 05/12/2017   Procedure: Right/Left Heart Cath and Coronary Angiography;  Surgeon: Lyn Records, MD;  Location: [facility] INVASIVE CV LAB;  Service: Cardiovascular;  Laterality: N/A;  . TEE WITHOUT CARDIOVERSION N/A 05/18/2017   Procedure: TRANSESOPHAGEAL ECHOCARDIOGRAM (TEE);  Surgeon: Wendall Stade, MD;  Location: [facility] ENDOSCOPY;  Service: Cardiovascular;  Laterality: N/A;    Current Outpatient Medications  Medication Sig Dispense Refill  . atorvastatin (LIPITOR) 20 MG tablet Take 20 mg by mouth every evening.  3  . Cholecalciferol (VITAMIN D-3) 5000 units TABS Take 5,000 Units by mouth daily.    Marland Kitchen  dofetilide (TIKOSYN) 500 MCG capsule Take 1 capsule (500 mcg total) by mouth 2 (two) times daily. 60 capsule 6  . FREESTYLE PRECISION NEO TEST test strip USE AS DIRECTED TO TEST BLOOD  SUGAR DAILY E11.21    . losartan (COZAAR) 50 MG tablet Take 50 mg by mouth daily.    . metFORMIN (GLUCOPHAGE-XR) 500 MG 24 hr tablet Take 2,000 mg by mouth at bedtime.    . metoprolol succinate (TOPROL-XL) 50 MG 24 hr tablet TAKE 2 TABLETS BY MOUTH EVERY DAY 45 tablet 6  . omeprazole (PRILOSEC) 40 MG capsule Take 40 mg by mouth every morning.    . rivaroxaban (XARELTO) 20 MG TABS tablet Take 1 tablet (20 mg total) by mouth daily with supper. 90 tablet 1  . Magnesium Oxide 400 MG CAPS Take 1 capsule (400 mg total) by mouth 2 (two) times daily. 180 capsule 3   No current facility-administered medications for this encounter.    Allergies  Allergen Reactions  . Calcium Channel Blockers Other (See Comments)    Acute hypotension, patient became brady/asystole < 5 seconds    Social History   Socioeconomic History  . Marital status: Widowed    Spouse name: Not on file  . Number of children: Not on file  . Years of education: Not on file  . Highest education level: Not on file  Occupational History  . Not on file  Tobacco Use  . Smoking status: Never Smoker  . Smokeless tobacco: Never Used  Substance and Sexual Activity  . Alcohol use: No  . Drug use: No  . Sexual activity: Not on file  Other Topics Concern  . Not on file  Social History Narrative  . Not on file   Social Determinants of Health   Financial Resource Strain:   . Difficulty of Paying Living Expenses:   Food Insecurity:   . Worried About Programme researcher, broadcasting/film/video in the Last Year:   . Barista in the Last Year:   Transportation Needs:   . Freight forwarder (Medical):   Marland Kitchen Lack of Transportation (Non-Medical):   Physical Activity:   . Days of Exercise per Week:   . Minutes of Exercise per Session:   Stress:   . Feeling of Stress :   Social Connections:   . Frequency of Communication with Friends and Family:   . Frequency of Social Gatherings with Friends and Family:   . Attends Religious Services:   .  Active Member of Clubs or Organizations:   . Attends Banker Meetings:   Marland Kitchen Marital Status:   Intimate Partner Violence:   . Fear of Current or Ex-Partner:   . Emotionally Abused:   Marland Kitchen Physically Abused:   . Sexually Abused:      ROS- All systems are reviewed and negative except as per the HPI above.  Physical Exam: Vitals:   01/21/20 1338  BP: 124/74  Pulse: (!) 59  Weight: 100.6 kg  Height: 5' (1.524 m)    GEN- The patient is well appearing obese elderly female, alert and oriented x 3 today.   HEENT-head normocephalic, atraumatic, sclera clear, conjunctiva pink, hearing intact, trachea midline. Lungs- Clear to ausculation bilaterally, normal work of breathing Heart- Regular rate and rhythm, no murmurs, rubs or gallops  GI- soft, NT, ND, + BS Extremities- no clubbing, cyanosis, or edema MS- no significant deformity or atrophy Skin- no rash or lesion Psych- euthymic mood, full affect Neuro- strength and sensation are  intact   Wt Readings from Last 3 Encounters:  01/21/20 100.6 kg  10/10/19 98.4 kg  09/16/19 98.1 kg    EKG today demonstrates SR HR 59, LAD, PR 150, QRS 84, QTc 451  Echo 10/25/19 demonstrated  1. Left ventricular ejection fraction, by visual estimation, is 55 to  60%. The left ventricle has normal function. There is no left ventricular  hypertrophy.  2. Left ventricular diastolic parameters are consistent with Grade I  diastolic dysfunction (impaired relaxation).  3. The left ventricle has no regional wall motion abnormalities.  4. Global right ventricle has mildly reduced systolic function.The right  ventricular size is mildly enlarged. No increase in right ventricular wall  thickness.  5. Left atrial size was mildly dilated.  6. Right atrial size was normal.  7. Mild mitral annular calcification.  8. The mitral valve is normal in structure. Mild mitral valve  regurgitation. No evidence of mitral stenosis.  9. The tricuspid  valve is normal in structure.  10. The aortic valve is tricuspid. Aortic valve regurgitation is not  visualized. Mild to moderate aortic valve sclerosis/calcification without  any evidence of aortic stenosis.  11. The tricuspid regurgitant velocity is 3.35 m/s, and with an assumed  right atrial pressure of 3 mmHg, the estimated right ventricular systolic  pressure is moderately elevated at 47.9 mmHg.  12. The inferior vena cava is normal in size with greater than 50%  respiratory variability, suggesting right atrial pressure of 3 mmHg.   Epic records are reviewed at length today  Assessment and Plan:  1. Persistent atrial fibrillation S/p dofetilide loading 11/19-11/23/20. Patient appears to be maintaining SR. Continue dofetilide 500 mcg BID. QT stable. Check bmet/mag today. Continue Xarelto 20 mg daily.  This patients CHA2DS2-VASc Score and unadjusted Ischemic Stroke Rate (% per year) is equal to 7.2 % stroke rate/year from a score of 5  Above score calculated as 1 point each if present [CHF, HTN, DM, Vascular=MI/PAD/Aortic Plaque, Age if 65-74, or Female] Above score calculated as 2 points each if present [Age > 75, or Stroke/TIA/TE]  2. Obesity Body mass index is 43.32 kg/m. Lifestyle modification was discussed and encouraged including regular physical activity and weight reduction.  3. HTN Stable, no changes today.  4. Chronic combined CHF EF normalized on recent echo. No signs or symptoms of fluid overload today.   Follow up with Dr Sallyanne Kuster per recall. AF clinic in 6 months.    [facility] [address] [place], [place] [address] [phone] 01/21/2020 4:15 PM

## 2020-01-21 NOTE — Progress Notes (Signed)
Thanks, Ricky 

## 2020-03-23 ENCOUNTER — Other Ambulatory Visit: Payer: Self-pay | Admitting: Physician Assistant

## 2020-03-25 NOTE — Telephone Encounter (Signed)
This medication was prescribed in the hospital. This is Dr. Erin Hearing pt

## 2020-03-25 NOTE — Telephone Encounter (Signed)
Rx request sent to pharmacy.  

## 2020-05-18 ENCOUNTER — Telehealth: Payer: Self-pay | Admitting: Cardiovascular Disease

## 2020-05-18 NOTE — Telephone Encounter (Signed)
LVM for patient to return call to get follow up scheduled with [facility] from recall list 

## 2020-06-08 DIAGNOSIS — Z0000 Encounter for general adult medical examination without abnormal findings: Secondary | ICD-10-CM | POA: Diagnosis not present

## 2020-06-08 DIAGNOSIS — E785 Hyperlipidemia, unspecified: Secondary | ICD-10-CM | POA: Diagnosis not present

## 2020-06-08 DIAGNOSIS — Z1159 Encounter for screening for other viral diseases: Secondary | ICD-10-CM | POA: Diagnosis not present

## 2020-06-08 DIAGNOSIS — E1169 Type 2 diabetes mellitus with other specified complication: Secondary | ICD-10-CM | POA: Diagnosis not present

## 2020-06-08 DIAGNOSIS — I129 Hypertensive chronic kidney disease with stage 1 through stage 4 chronic kidney disease, or unspecified chronic kidney disease: Secondary | ICD-10-CM | POA: Diagnosis not present

## 2020-06-25 ENCOUNTER — Other Ambulatory Visit: Payer: Self-pay | Admitting: Cardiovascular Disease

## 2020-06-26 ENCOUNTER — Telehealth: Payer: Self-pay | Admitting: Internal Medicine

## 2020-06-26 NOTE — Telephone Encounter (Signed)
Patient contacted with Dr. Thurston Hole recommendation. We will not order any scans until he sees her at her next appointment.

## 2020-06-26 NOTE — Telephone Encounter (Signed)
Patient has a follow up appointment in October. She is asking if she needs a CXR or HRCT prior. Please advise.

## 2020-06-26 NOTE — Telephone Encounter (Signed)
Let me see her first - that's a lot of radiation and expense she may or may not need to incur

## 2020-07-15 ENCOUNTER — Other Ambulatory Visit: Payer: Self-pay

## 2020-07-15 ENCOUNTER — Encounter: Payer: Self-pay | Admitting: Cardiovascular Disease

## 2020-07-15 ENCOUNTER — Ambulatory Visit: Payer: Medicare Other | Admitting: Cardiovascular Disease

## 2020-07-15 VITALS — BP 115/54 | HR 64 | Ht 60.0 in | Wt 230.0 lb

## 2020-07-15 DIAGNOSIS — I4819 Other persistent atrial fibrillation: Secondary | ICD-10-CM

## 2020-07-15 DIAGNOSIS — E1169 Type 2 diabetes mellitus with other specified complication: Secondary | ICD-10-CM

## 2020-07-15 DIAGNOSIS — Z7901 Long term (current) use of anticoagulants: Secondary | ICD-10-CM

## 2020-07-15 DIAGNOSIS — E669 Obesity, unspecified: Secondary | ICD-10-CM

## 2020-07-15 DIAGNOSIS — Z79899 Other long term (current) drug therapy: Secondary | ICD-10-CM

## 2020-07-15 DIAGNOSIS — I2781 Cor pulmonale (chronic): Secondary | ICD-10-CM | POA: Diagnosis not present

## 2020-07-15 DIAGNOSIS — Z5181 Encounter for therapeutic drug level monitoring: Secondary | ICD-10-CM

## 2020-07-15 DIAGNOSIS — I5032 Chronic diastolic (congestive) heart failure: Secondary | ICD-10-CM | POA: Diagnosis not present

## 2020-07-15 DIAGNOSIS — I10 Essential (primary) hypertension: Secondary | ICD-10-CM

## 2020-07-15 DIAGNOSIS — J8410 Pulmonary fibrosis, unspecified: Secondary | ICD-10-CM | POA: Diagnosis not present

## 2020-07-15 MED ORDER — METOPROLOL SUCCINATE ER 50 MG PO TB24: 50.0000 mg | ORAL_TABLET | Freq: Every day | ORAL | 5 refills | Status: DC

## 2020-07-15 NOTE — Progress Notes (Signed)
Cardiology Office Note:    Date:  07/16/2020   ID:  CHASEY DULL, DOB Oct 15, 1941, MRN [id number]  PCP:  Laurann Montana, MD  Cardiologist:  Thurmon Fair, MD    Referring MD: Laurann Montana, MD   Chief Complaint  Patient presents with  . Shortness of Breath     History of Present Illness:    Carrie Haynes is a 79 y.o. female with a hx of hypertension, hyperlipidemia, type 2 diabetes mellitus, remote venous thromboembolic disease, initially diagnosed with atrial fibrillation with rapid ventricular response in July 2018 when she presented with tachycardia cardiomyopathy.  Her echo showed mildly depressed LVEF at 45% with diffuse hypokinesis and moderate mitral regurgitation, mildly dilated left atrium, but also a moderately dilated right atrium with moderate to severe tricuspid regurgitation and mild pulmonary hypertension.  Coronary angiography showed no evidence of significant CAD.  Rate control was difficult.  She underwent cardioversion on August 2 unsuccessfully, but eventually after loading with IV amiodarone was successfully cardioverted on August 7. Pulmonary function tests (before treatment with amiodarone) raised concern for restrictive lung disease, thereby making long-term treatment with amiodarone undesirable.  Amiodarone was stopped in November 2018. Follow-up echocardiogram shows complete normalization of left ventricular systolic function, EF 55-60%, with some residual signs of impaired relaxation.  A CT of the chest performed in July 2020 did show evidence of mild-moderate pulmonary fibrosis, but this was unchanged from the previous CT in 2018.    In November 2020 she presented with recurrent shortness of breath and was found to again have atrial fibrillation.  She was in rapid ventricular response but was unaware of the arrhythmia.  Echocardiography shows her EF has again dropped to 45-50%.  She was hospitalized for dofetilide loading, converted to sinus rhythm without  electrical cardioversion during antiarrhythmic administration.  Her biggest complaint is exertional dyspnea.  She becomes out of breath even walking short distances or getting dressed in the morning.  She occasionally has wheezing.  She does not have cough or hemoptysis.  She definitely does not have orthopnea.  In fact when she becomes short of breath she lies down in bed and feels better.  She denies PND.  She sleeps well at night.  She has an appointment in the pulmonary clinic with Dr. Sherene Sires on October 12.  She has not had any overt episodes of atrial fibrillation and does not have palpitations, dizziness or syncope.  She has noticed recently that she has a small quantity of blood with each stool.  She does not have a known history of hemorrhoids or rectal pain.  She denies overt hematochezia between bowel movements.  She has not had hematemesis or melena.  She denies angina pectoris, claudication, lower extremity edema.  She is short of breath today.  Her ECG shows sinus rhythm and the QT is in desirable range (QTC 410 ms).   Past Medical History:  Diagnosis Date  . A-fib (HCC)   . Diabetes mellitus without complication (HCC)   . Hyperlipemia   . Hypertension     Past Surgical History:  Procedure Laterality Date  . CARDIOVERSION N/A 05/18/2017   Procedure: CARDIOVERSION;  Surgeon: Wendall Stade, MD;  Location: [facility] ENDOSCOPY;  Service: Cardiovascular;  Laterality: N/A;  . CARDIOVERSION N/A 05/23/2017   Procedure: CARDIOVERSION;  Surgeon: Quintella Reichert, MD;  Location: [facility] ENDOSCOPY;  Service: Cardiovascular;  Laterality: N/A;  . CARDIOVERSION N/A 08/28/2019   Procedure: CARDIOVERSION;  Surgeon: Jake Bathe, MD;  Location: [facility] ENDOSCOPY;  Service: Cardiovascular;  Laterality: N/A;  . RIGHT/LEFT HEART CATH AND CORONARY ANGIOGRAPHY N/A 05/12/2017   Procedure: Right/Left Heart Cath and Coronary Angiography;  Surgeon: Lyn Records, MD;  Location: [facility] INVASIVE CV LAB;  Service: Cardiovascular;   Laterality: N/A;  . TEE WITHOUT CARDIOVERSION N/A 05/18/2017   Procedure: TRANSESOPHAGEAL ECHOCARDIOGRAM (TEE);  Surgeon: Wendall Stade, MD;  Location: [facility] ENDOSCOPY;  Service: Cardiovascular;  Laterality: N/A;    Current Medications: Current Meds  Medication Sig  . atorvastatin (LIPITOR) 20 MG tablet Take 20 mg by mouth every evening.  . Cholecalciferol (VITAMIN D-3) 5000 units TABS Take 5,000 Units by mouth daily.  Marland Kitchen dofetilide (TIKOSYN) 500 MCG capsule TAKE 1 CAPSULE BY MOUTH TWICE A DAY  . FREESTYLE PRECISION NEO TEST test strip USE AS DIRECTED TO TEST BLOOD SUGAR DAILY E11.21  . losartan (COZAAR) 50 MG tablet Take 50 mg by mouth daily.  . Magnesium Oxide 400 MG CAPS Take 1 capsule (400 mg total) by mouth 2 (two) times daily.  . metFORMIN (GLUCOPHAGE-XR) 500 MG 24 hr tablet Take 2,000 mg by mouth at bedtime.  . metoprolol succinate (TOPROL-XL) 50 MG 24 hr tablet Take 1 tablet (50 mg total) by mouth daily. Take with or immediately following a meal.  . omeprazole (PRILOSEC) 40 MG capsule Take 40 mg by mouth every morning.  Carlena Hurl 20 MG TABS tablet TAKE 1 TABLET (20 MG TOTAL) BY MOUTH DAILY WITH SUPPER.  . [DISCONTINUED] metoprolol succinate (TOPROL-XL) 50 MG 24 hr tablet TAKE 2 TABLETS BY MOUTH EVERY DAY     Allergies:   Calcium channel blockers   Social History   Socioeconomic History  . Marital status: Widowed    Spouse name: Not on file  . Number of children: Not on file  . Years of education: Not on file  . Highest education level: Not on file  Occupational History  . Not on file  Tobacco Use  . Smoking status: Never Smoker  . Smokeless tobacco: Never Used  Substance and Sexual Activity  . Alcohol use: No  . Drug use: No  . Sexual activity: Not on file  Other Topics Concern  . Not on file  Social History Narrative  . Not on file   Social Determinants of Health   Financial Resource Strain:   . Difficulty of Paying Living Expenses: Not on file  Food Insecurity:    . Worried About Programme researcher, broadcasting/film/video in the Last Year: Not on file  . Ran Out of Food in the Last Year: Not on file  Transportation Needs:   . Lack of Transportation (Medical): Not on file  . Lack of Transportation (Non-Medical): Not on file  Physical Activity:   . Days of Exercise per Week: Not on file  . Minutes of Exercise per Session: Not on file  Stress:   . Feeling of Stress : Not on file  Social Connections:   . Frequency of Communication with Friends and Family: Not on file  . Frequency of Social Gatherings with Friends and Family: Not on file  . Attends Religious Services: Not on file  . Active Member of Clubs or Organizations: Not on file  . Attends Banker Meetings: Not on file  . Marital Status: Not on file     Family History: The patient's family history includes Breast cancer in an other family member; Heart attack in her sister; Hypertension in her father. ROS:   Please see the history of present illness.  All other systems are reviewed and are negative.  EKGs/Labs/Other Studies Reviewed:    The following studies were reviewed today: Echo Nov 2018  ECHO 09/03/2019:  1. Left ventricular ejection fraction, by visual estimation, is 45 to 50%. ...   ECHO 10/25/2019:  1. Left ventricular ejection fraction, by visual estimation, is 55 to  60%. The left ventricle has normal function. There is no left ventricular  hypertrophy.  2. Left ventricular diastolic parameters are consistent with Grade I  diastolic dysfunction (impaired relaxation).  3. The left ventricle has no regional wall motion abnormalities.  4. Global right ventricle has mildly reduced systolic function.The right  ventricular size is mildly enlarged. No increase in right ventricular wall  thickness.  5. Left atrial size was mildly dilated.  6. Right atrial size was normal.  7. Mild mitral annular calcification.  8. The mitral valve is normal in structure. Mild mitral valve   regurgitation. No evidence of mitral stenosis.  9. The tricuspid valve is normal in structure.  10. The aortic valve is tricuspid. Aortic valve regurgitation is not  visualized. Mild to moderate aortic valve sclerosis/calcification without  any evidence of aortic stenosis.  11. The tricuspid regurgitant velocity is 3.35 m/s, and with an assumed  right atrial pressure of 3 mmHg, the estimated right ventricular systolic  pressure is moderately elevated at 47.9 mmHg.  12. The inferior vena cava is normal in size with greater than 50%  respiratory variability, suggesting right atrial pressure of 3 mmHg.   EKG:  EKG is ordered today.  It shows sinus rhythm with rare PVCs, left axis deviation, no repolarization changes, low voltage due to obesity.  The QTC is normal at 410 ms.  Recent Labs: 08/21/2019: ALT 19 09/02/2019: Hemoglobin 11.0; Platelets 224 09/03/2019: B Natriuretic Peptide 269.2 01/21/2020: BUN 22; Creatinine, Ser 0.99; Magnesium 1.8; Potassium 4.8; Sodium 135   06/08/2020 Hemoglobin 12.3, creatinine 1.1, potassium 5.0, normal liver function tests, TSH 2.83  Recent Lipid Panel    Component Value Date/Time   CHOL 165 10/10/2019 1025   TRIG 96 10/10/2019 1025   HDL 52 10/10/2019 1025   CHOLHDL 3.2 10/10/2019 1025   CHOLHDL 2.6 05/13/2017 0331   VLDL 10 05/13/2017 0331   LDLCALC 95 10/10/2019 1025   06/08/2020 Total cholesterol 156, HDL 53, LDL 80, triglycerides 126 Physical Exam:    VS:  BP (!) 115/54   Pulse 64   Ht 5' (1.524 m)   Wt 230 lb (104.3 kg)   SpO2 97%   BMI 44.92 kg/m     Wt Readings from Last 3 Encounters:  07/15/20 230 lb (104.3 kg)  01/21/20 221 lb 12.8 oz (100.6 kg)  10/10/19 217 lb (98.4 kg)      General: Alert, oriented x3, no distress, morbidly obese Head: no evidence of trauma, PERRL, EOMI, no exophtalmos or lid lag, no myxedema, no xanthelasma; normal ears, nose and oropharynx Neck: normal jugular venous pulsations and no hepatojugular  reflux; brisk carotid pulses without delay and no carotid bruits Chest: A few very faint crackles in both lung bases, otherwise clear to auscultation, no signs of consolidation by percussion or palpation, normal fremitus, symmetrical and full respiratory excursions Cardiovascular: normal position and quality of the apical impulse, regular rhythm, normal first and second heart sounds, no murmurs, rubs or gallops Abdomen: no tenderness or distention, no masses by palpation, no abnormal pulsatility or arterial bruits, normal bowel sounds, no hepatosplenomegaly Extremities: no clubbing, cyanosis or edema; 2+ radial, ulnar and  brachial pulses bilaterally; 2+ right femoral, posterior tibial and dorsalis pedis pulses; 2+ left femoral, posterior tibial and dorsalis pedis pulses; no subclavian or femoral bruits Neurological: grossly nonfocal Psych: Normal mood and affect     ASSESSMENT:    1. Persistent atrial fibrillation (HCC)   2. Chronic diastolic heart failure (HCC)   3. Pulmonary fibrosis (HCC)   4. Cor pulmonale, chronic (HCC)   5. Essential hypertension   6. Morbid obesity due to excess calories (HCC)   7. Diabetes mellitus type 2 in obese (HCC)   8. Encounter for monitoring dofetilide therapy   9. Long term (current) use of anticoagulants    PLAN:    In order of problems listed above:  1. AFib: She is maintaining sinus rhythm on dofetilide but is at high risk of recurrent arrhythmia since she has biatrial dilation.  Since she has not had any recent breakthrough events with cut back on her metoprolol dose.  Continue anticoagulation with Xarelto.  CHA2DS2-VASc 5 (age 56, gender, hypertension, CHF).   2. CHF: She has significant shortness of breath but no orthopnea, PND or lower extremity edema.  The most recent echocardiogram showed resolution of the left ventricular dysfunction.  My suspicion is that she does not have cardiac dyspnea.  If her pulmonary specialist recommends it, we can stop  her losartan.  I would continue at least a low-dose of metoprolol to protect from breakthrough atrial fibrillation. 3. Pulmonary fibrosis: Etiology uncertain.  She was only on amiodarone for about 3 months and restrictive lung disease abnormalities on her PFTs were present before amiodarone was never initiated.  Follow-up in pulmonary clinic.  Clearly some part of her restriction is due to her obesity. 4. Cor pulmonale: unclear to what degree this is due to restrictive lung disease, obesity, previous venous thromboembolic events, possible sleep disordered breathing.  Increases the risk of future arrhythmia recurrence.  Currently on physical exam she does not have evidence of edema or jugular venous distention. 5. HTN: Very well controlled.  Cut the dose of metoprolol to 50 mg daily.  If Dr. Sherene Sires recommends, okay to discontinue the losartan.  If her blood pressure increases we could switch to amlodipine. 6. Obesity: This is at least partly responsible for her restrictive lung problems and shortness of breath, but her symptoms appear to be worsening despite the fact that she has lost some weight. 7. DM: Most recent hemoglobin A1c was not great, but acceptable at 7.4%.  Excellent lipid profile. 8. Dofetilide monitoring: QT interval in normal range.  Labs performed just last month favorable.  Reminded her she needs to have EKG and labs every 6 months and reminded her of the risk of multiple drug interactions with this medication. 9. Anticoagulation: It sounds to me like she is having scanty hemorrhoidal bleeding, rather than a serious GI condition.  I advised using some type of over the counter preparations a couple of weeks.  If the bleeding continues she should contact her GI specialist.  Her hemoglobin was normal just a few weeks ago.   Medication Adjustments/Labs and Tests Ordered: Current medicines are reviewed at length with the patient today.  Concerns regarding medicines are outlined above.  Orders  Placed This Encounter  Procedures  . EKG 12-Lead   Meds ordered this encounter  Medications  . metoprolol succinate (TOPROL-XL) 50 MG 24 hr tablet    Sig: Take 1 tablet (50 mg total) by mouth daily. Take with or immediately following a meal.    Dispense:  30 tablet    Refill:  5    Signed, Thurmon Fair, MD  07/16/2020 8:07 AM    [facility]

## 2020-07-15 NOTE — Patient Instructions (Signed)
Medication Instructions:  DECREASE the Metoprolol to 50 mg once daily  *If you need a refill on your cardiac medications before your next appointment, please call your pharmacy*   Lab Work: None ordered If you have labs (blood work) drawn today and your tests are completely normal, you will receive your results only by: Marland Kitchen MyChart Message (if you have MyChart) OR . A paper copy in the mail If you have any lab test that is abnormal or we need to change your treatment, we will call you to review the results.   Testing/Procedures: None ordered   Follow-Up: At [facility], you and your health needs are our priority.  As part of our continuing mission to provide you with exceptional heart care, we have created designated Provider Care Teams.  These Care Teams include your primary Cardiologist (physician) and Advanced Practice Providers (APPs -  Physician Assistants and Nurse Practitioners) who all work together to provide you with the care you need, when you need it.  We recommend signing up for the patient portal called "MyChart".  Sign up information is provided on this After Visit Summary.  MyChart is used to connect with patients for Virtual Visits (Telemedicine).  Patients are able to view lab/test results, encounter notes, upcoming appointments, etc.  Non-urgent messages can be sent to your provider as well.   To learn more about what you can do with MyChart, go to [email or web address].    Your next appointment:   6 month(s)  The format for your next appointment:   In Person  Provider:   You may see Thurmon Fair, MD or one of the following Advanced Practice Providers on your designated Care Team:    Azalee Course, PA-C  Micah Flesher, PA-C or   Judy Pimple, [place]

## 2020-07-16 ENCOUNTER — Encounter: Payer: Self-pay | Admitting: Cardiovascular Disease

## 2020-07-22 ENCOUNTER — Ambulatory Visit (HOSPITAL_COMMUNITY): Payer: Medicare Other | Admitting: Physician Assistant

## 2020-07-28 ENCOUNTER — Other Ambulatory Visit: Payer: Self-pay

## 2020-07-28 ENCOUNTER — Ambulatory Visit (INDEPENDENT_AMBULATORY_CARE_PROVIDER_SITE_OTHER): Payer: Medicare Other

## 2020-07-28 ENCOUNTER — Encounter: Payer: Self-pay | Admitting: Internal Medicine

## 2020-07-28 ENCOUNTER — Ambulatory Visit: Payer: Medicare Other | Admitting: Internal Medicine

## 2020-07-28 DIAGNOSIS — D500 Iron deficiency anemia secondary to blood loss (chronic): Secondary | ICD-10-CM

## 2020-07-28 DIAGNOSIS — R0600 Dyspnea, unspecified: Secondary | ICD-10-CM

## 2020-07-28 DIAGNOSIS — I517 Cardiomegaly: Secondary | ICD-10-CM | POA: Diagnosis not present

## 2020-07-28 DIAGNOSIS — R0602 Shortness of breath: Secondary | ICD-10-CM | POA: Diagnosis not present

## 2020-07-28 DIAGNOSIS — R058 Other specified cough: Secondary | ICD-10-CM

## 2020-07-28 DIAGNOSIS — R0609 Other forms of dyspnea: Secondary | ICD-10-CM

## 2020-07-28 NOTE — Progress Notes (Signed)
Subjective:     Patient ID: Carrie Haynes, female   DOB: 09-Jul-1941      MRN: [id number]  HPI  49 yowf never smoker with remote dx of AB but did not stay on inhalers and breathing was fine x ? Years/? Decades but since early 2018 noted doe > admitted  Admit date: 05/10/2017 Discharge date: 05/23/2017   Primary Care Provider: Laurann Montana Primary Cardiologist: Dr. Royann Shivers (per patient)  Discharge Diagnoses    Principal Problem:   Atrial fibrillation [facility]) Active Problems:   Dyspnea   Acute on chronic combined systolic and diastolic CHF (congestive heart failure) (HCC)   Asystole (HCC)   Hypotension   Atrial fibrillation with RVR (HCC)   DCM (dilated cardiomyopathy) (HCC)   Allergies      Allergies  Allergen Reactions  . Calcium Channel Blockers Other (See Comments)    Acute hypotension, patient became brady/asystole < 5 seconds     History of Present Illness     Carrie Haynes is a 79 yo female with PMH of NIDDM, HTN, and HL. Reports Carrie Haynes is followed by her PCP for chronic issues. Family hx of CAD with half sister having an MI.  Reports Carrie Haynes has felt short of breath over the past 6 months, but recently worsened. States 2 nights ago Carrie Haynes developed worsening dyspnea, and orthopnea.Complains of SSCPFelt like a stabbing sensation in her chest with deep inspiration Also in between shoulder blades Again, pleuritic.  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 Was on coumadin afterwards, and then taken off. States Carrie Haynes had another PE afterwards, and on coumadin again. This was many years ago per her report. Carrie Haynes has also had some episodes of dizziness over the past couple of weeks, mostly with walking. Flet like Carrie Haynes had to hold herself up with wall Also reports some palpitations prior to admission.   Presented to the ED with worsening dyspnea. Carrie Haynes was started on IV heparin given concern for possible PE. CTA was  negative for PE but showed evidence to support pulmonary hypertension. Labs showed stable electrolytes, BNP 115, Hgb 10.6, I stat Trop 0.01, Mag 1.5. CXR negative for edema. EKG showed new onset Afib RVR. Placed on IV dilt and rate improved from 140s to 100s. Given 2g IV mag in the ED.    Hospital Course     Consultants: None  Carrie Haynes underwent right and left heart catheterization on 05/12/17 with normal coronaries and with elevated filling pressures consistent with acute on chronic combined systolic and diastolic heart failure. Left ventricular systolic dysfunction was thought to be secondary to tachycardia. Carrie Haynes was placed on PO amiodarone and underwent TEE/DCCV on 05/18/17. The cardioversion was unsuccessful. Carrie Haynes was continued on anticoagulation (xarelto) and started on IV amiodarone with plans for repeat DCCV. Unfortunately, IV amiodarone infiltrated with bilateral thrombophlebitis in both arms, no skin necrosis. Second DCCV without TEE was performed on 05/23/17 with conversion to sinus rhythm. EKG confirms sinus rhythm.   Will continue xarelto and PO amiodarone.   Borderline BP precludes initiation of conventional heart failure medications. Will repeat echo in three months after established NSR to evaluate function. Reduced LV function may be related to Afib and tachycardia.       07/12/2017 1st [facility] office visit/ Daquann Merriott  Re doe ? Amiodarone related  Chief Complaint  Patient presents with  . Advice Only    Referred by Dr. Royann Shivers. Carrie Haynes had an  abnormal PFT 05/19/17. Carrie Haynes admitted to hospital 7/25-8/7 due to atrial fibrillation and was unable to breathe. Carrie Haynes currently complains of occ. cough and SOB on exertion.  doe x MMRC2 = can't walk a nl pace on a flat grade s sob but does fine slow and flat eg shopping / leaning on cart  When last had nl activity tol wt =  250 and despite wt loss has lost ground with doe over same time frame = about 8 months and much better since admit but not back  to prev baseline Also cough x years sporadic and dry and notes at hs and in am > rest of the day  but no worse on amio Sleeps ok on one pillow  rec Add pepcid 20 mg one hour before bed GERD   Monitor your oxygen level with regular paced walking and let me and Dr C know right away if trending down over several days or weeks (one or two days is not a true "trend" so keep monitoring as regularly as you can)     D/C amiodarone around 07/17/17   10/23/2017  f/u ov/Anzel Kearse re: doe  @ 221  Chief Complaint  Patient presents with  . Follow-up    PFT's done today. Carrie Haynes states her breathing is back to her normal baseline and Carrie Haynes is no longer coughing. No new co's.   sleeps horizontal R side down  Doe improved/ not tracking sats though and vague about what activities Carrie Haynes actually attempts to do at this point rec Keep up the walking and monitor your  0xygen saturations and call for follow up appt if you see a downward trend from this point walking at a slower pace than what you did today. Pulmonary follow up can be as needed    07/28/2020  f/u ov/Purva Vessell re: steady downhill with breathing and coughing x 3 year Chief Complaint  Patient presents with  . Follow-up    DOE  Dyspnea:  Maybe an aisle or two at [employer] slowly = MMRC3 = can't walk 100 yards even at a slow pace at a flat grade s stopping due to sob  Cough: all day and when lie down then goes away p 5-10 min  and doesn't wake her  Sleeping: ok on R side flat bed one pillow  SABA use: none 02: none occ brb per rectum  Streaky  on stools while maint  on eliquis   No obvious day to day or daytime variability or assoc excess/ purulent sputum or mucus plugs or hemoptysis or cp or chest tightness, subjective wheeze or overt sinus or hb symptoms.   Sleeping as above  without nocturnal  or early am exacerbation  of respiratory  c/o's or need for noct saba. Also denies any obvious fluctuation of symptoms with weather or environmental changes or other  aggravating or alleviating factors except as outlined above   No unusual exposure hx or h/o childhood pna/ asthma or knowledge of premature birth.  Current Allergies, Complete Past Medical History, Past Surgical History, Family History, and Social History were reviewed in Carrie Haynes.  ROS  The following are not active complaints unless bolded Hoarseness, sore throat, dysphagia, dental problems, itching, sneezing,  nasal congestion or discharge of excess mucus or purulent secretions, ear ache,   fever, chills, sweats, unintended wt loss or wt gain, classically pleuritic or exertional cp,  orthopnea pnd or arm/hand swelling  or leg swelling, presyncope, palpitations, abdominal pain, anorexia, nausea, vomiting, diarrhea  or change in bowel habits or change in bladder habits, change in stools or change in urine, dysuria, hematuria,  rash, arthralgias, visual complaints, headache, numbness, weakness or ataxia or problems with walking or coordination,  change in mood or  memory.        Current Meds  Medication Sig  . atorvastatin (LIPITOR) 20 MG tablet Take 20 mg by mouth every evening.  . Cholecalciferol (VITAMIN D-3) 5000 units TABS Take 5,000 Units by mouth daily.  Marland Kitchen dofetilide (TIKOSYN) 500 MCG capsule TAKE 1 CAPSULE BY MOUTH TWICE A DAY  . FREESTYLE PRECISION NEO TEST test strip USE AS DIRECTED TO TEST BLOOD SUGAR DAILY E11.21  . losartan (COZAAR) 50 MG tablet Take 50 mg by mouth daily.  . Magnesium Oxide 400 MG CAPS Take 1 capsule (400 mg total) by mouth 2 (two) times daily.  . metFORMIN (GLUCOPHAGE-XR) 500 MG 24 hr tablet Take 2,000 mg by mouth at bedtime.  . metoprolol succinate (TOPROL-XL) 50 MG 24 hr tablet Take 1 tablet (50 mg total) by mouth daily. Take with or immediately following a meal.  . omeprazole (PRILOSEC) 40 MG capsule Take 40 mg by mouth every morning.  Carlena Hurl 20 MG TABS tablet TAKE 1 TABLET (20 MG TOTAL) BY MOUTH DAILY WITH SUPPER.                            Objective:   Physical Exam    amb obese pleasant wf nad   07/28/2020     229  10/23/2017         221   07/12/17 213 lb 9.6 oz (96.9 kg)  05/31/17 215 lb 9.6 oz (97.8 kg)  05/23/17 220 lb (99.8 kg)      Vital signs reviewed  07/28/2020  - Note at rest 02 sats  97% on RA     HEENT : Carrie Haynes wearing mask not removed for exam due to covid -19 concerns.    NECK :  without JVD/Nodes/TM/ nl carotid upstrokes bilaterally   LUNGS: no acc muscle use,  Nl contour chest which is clear to A and P bilaterally without cough on insp or exp maneuvers   CV:  RRR  no s3 or murmur or increase in P2, and no edema   ABD: obese/ soft and nontender with nl inspiratory excursion in the supine position. No bruits or organomegaly appreciated, bowel sounds nl  MS:  Nl gait/ ext warm without deformities, calf tenderness, cyanosis or clubbing No obvious joint restrictions   SKIN: warm and dry without lesions    NEURO:  alert, approp, nl sensorium with  no motor or cerebellar deficits apparent.      CXR PA and Lateral:   07/28/2020 :    I personally reviewed images and  impression as follows:   Mod kyphosis, gen increased markings non-specific    Labs ordered/ reviewed:      Chemistry      Component Value Date/Time   NA 135 07/28/2020 1517   NA 137 10/10/2019 1025   K 4.6 07/28/2020 1517   CL 100 07/28/2020 1517   CO2 23 07/28/2020 1517   BUN 24 (H) 07/28/2020 1517   BUN 25 10/10/2019 1025   CREATININE 0.97 07/28/2020 1517      Component Value Date/Time   CALCIUM 8.9 07/28/2020 1517   ALKPHOS 66 08/21/2019 1215   AST 21 08/21/2019 1215   ALT 19 08/21/2019 1215   BILITOT 0.8  08/21/2019 1215        Lab Results  Component Value Date   WBC 7.7 07/28/2020   HGB 8.2 Repeated and verified X2. (L) 07/28/2020   HCT 25.0 Repeated and verified X2. (L) 07/28/2020   MCV 92.9 07/28/2020   PLT 314.0 07/28/2020       EOS                                                               0.0                                     07/28/2020       Lab Results  Component Value Date   TSH 1.77 07/28/2020     Lab Results  Component Value Date   PROBNP 370.0 (H) 07/28/2020       Lab Results  Component Value Date   ESRSEDRATE 16 07/28/2020   ESRSEDRATE 43 (H) 07/12/2017             Assessment:

## 2020-07-28 NOTE — Patient Instructions (Addendum)
Check your medications when you get home to be sure they correlate with AVS and call with any discrepancies   Zyrtec 10 mg  One hour before bedtime (over the counter)   Omeprazole/Prilosec  should be 40 mg Take 30- 60 min before your first and last meals of the day   GERD (REFLUX)  is an extremely common cause of respiratory symptoms just like yours , many times with no obvious heartburn at all.    It can be treated with medication, but also with lifestyle changes including elevation of the head of your bed (ideally with 6 -8inch blocks under the headboard of your bed),  Smoking cessation, avoidance of late meals, excessive alcohol, and avoid fatty foods, chocolate, peppermint, colas, red wine, and acidic juices such as orange juice.  NO MINT OR MENTHOL PRODUCTS SO NO COUGH DROPS  USE SUGARLESS CANDY INSTEAD (Jolley ranchers or Stover's or Life Savers) or even ice chips will also do - the key is to swallow to prevent all throat clearing. NO OIL BASED VITAMINS - use powdered substitutes.  Avoid fish oil when coughing.   Please remember to go to the lab and x-ray department   for your tests - we will call you with the results when they are available.     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

## 2020-07-29 ENCOUNTER — Encounter: Payer: Self-pay | Admitting: Internal Medicine

## 2020-07-29 LAB — BASIC METABOLIC PANEL
BUN: 24 mg/dL — ABNORMAL HIGH (ref 6–23)
CO2: 23 mEq/L (ref 19–32)
Calcium: 8.9 mg/dL (ref 8.4–10.5)
Chloride: 100 mEq/L (ref 96–112)
Creatinine, Ser: 0.97 mg/dL (ref 0.40–1.20)
GFR: 56.35 mL/min — ABNORMAL LOW (ref >60.00)
Glucose, Bld: 116 mg/dL — ABNORMAL HIGH (ref 70–99)
Potassium: 4.6 mEq/L (ref 3.5–5.1)
Sodium: 135 mEq/L (ref 135–145)

## 2020-07-29 LAB — TSH: TSH: 1.77 u[IU]/mL (ref 0.35–4.50)

## 2020-07-29 LAB — CBC WITH DIFFERENTIAL/PLATELET
Basophils Absolute: 0.1 10*3/uL (ref 0.0–0.1)
Basophils Relative: 1 % (ref 0.0–3.0)
Eosinophils Absolute: 0 10*3/uL (ref 0.0–0.7)
Eosinophils Relative: 0.5 % (ref 0.0–5.0)
HCT: 25 % — ABNORMAL LOW (ref 36.0–46.0)
Hemoglobin: 8.2 g/dL — ABNORMAL LOW (ref 12.0–15.0)
Lymphocytes Relative: 23.8 % (ref 12.0–46.0)
Lymphs Abs: 1.8 10*3/uL (ref 0.7–4.0)
MCHC: 32.6 g/dL (ref 30.0–36.0)
MCV: 92.9 fl (ref 78.0–100.0)
Monocytes Absolute: 0.5 10*3/uL (ref 0.1–1.0)
Monocytes Relative: 6.9 % (ref 3.0–12.0)
Neutro Abs: 5.2 10*3/uL (ref 1.4–7.7)
Neutrophils Relative %: 67.8 % (ref 43.0–77.0)
Platelets: 314 10*3/uL (ref 150.0–400.0)
RBC: 2.7 Mil/uL — ABNORMAL LOW (ref 3.87–5.11)
RDW: 14.3 % (ref 11.5–15.5)
WBC: 7.7 10*3/uL (ref 4.0–10.5)

## 2020-07-29 LAB — BRAIN NATRIURETIC PEPTIDE: Pro B Natriuretic peptide (BNP): 370 pg/mL — ABNORMAL HIGH (ref 0.0–100.0)

## 2020-07-29 LAB — IGE: IgE (Immunoglobulin E), Serum: 370 kU/L — ABNORMAL HIGH (ref <=114)

## 2020-07-29 LAB — SEDIMENTATION RATE: Sed Rate: 16 mm/hr (ref 0–30)

## 2020-07-29 NOTE — Assessment & Plan Note (Signed)
Complicated by dm/hbp/ hyperlidemia  Body mass index is 44.76 kg/m.  -  trending up  Lab Results  Component Value Date   TSH 2.93 07/12/2017     Contributing to gerd risk/ doe/reviewed the need and the process to achieve and maintain neg calorie balance > defer f/u primary care including intermittently monitoring thyroid status            Each maintenance medication was reviewed in detail including emphasizing most importantly the difference between maintenance and prns and under what circumstances the prns are to be triggered using an action plan format where appropriate.  Total time for H and P, chart review, counseling, teaching device and generating customized AVS unique to this office visit / charting = 44 min

## 2020-07-29 NOTE — Assessment & Plan Note (Addendum)
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 Again within limitations of comparison, this does not appear significantly changed compared to CT dated 05/10/2017 rec f/u with serial 02 sats with ex to monitor for progression > no dedicated f/u needed  - 07/28/2020   Walked on RA x one lap =  approx 250 ft -@ avg pace stopped due to sob with sats down to 87%    Ms Hackbart does appear to have difficult to sort out respiratory symptoms of unknown origin for which  DDX  = almost all start with A and  include Adherence, Ace Inhibitors, Acid Reflux, Active Sinus Disease, Alpha 1 Antitripsin deficiency, Anxiety masquerading as Airways dz,  ABPA,  Allergy(esp in young), Aspiration (esp in elderly), Adverse effects of meds,  Active smoking or Vaping, A bunch of PE's/clot burden (a few small clots can't cause this syndrome unless there is already severe underlying pulm or vascular dz with poor reserve),  Anemia or thyroid disorder, plus two Bs  = Bronchiectasis and Beta blocker use..and one C= CHF    Adherence is always the initial "prime suspect" and is a multilayered concern that requires a "trust but verify" approach in every patient - starting with knowing how to use medications, especially inhalers, correctly, keeping up with  refills and understanding the fundamental difference between maintenance and prns vs those medications only taken for a very short course and then stopped and not refilled.  - have asked her to do medication reconciliation at home "like balancing a checkbook"  And call with any discrepancies from her AVS list today  ? Acid (or non-acid) GERD > always difficult to exclude as up to 75% of pts in some series report no assoc GI/ Heartburn symptoms> rec max (24h)  acid suppression and diet restrictions/ reviewed and instructions given in writing.   ? Adverse drug effects > D/C'd amiodarone around 07/17/17 and none of the other usual suspects listed  ? Anemia/ thyroid dz > tsh ok but def anemia > see sep a/p  ? A bunch of PEs > CTa neg 09/03/2019 and on xarelto for afib so very unlikely   ? BB effects > unlikely on relatively  low doses of Toprol  ? chf > bnp intermediate level and echo was ok in 10/2019 but looks a bit wet on cxr and could be related to or exac by anemia now so will address the anemia first.

## 2020-07-30 DIAGNOSIS — D649 Anemia, unspecified: Secondary | ICD-10-CM | POA: Insufficient documentation

## 2020-07-30 DIAGNOSIS — D500 Iron deficiency anemia secondary to blood loss (chronic): Secondary | ICD-10-CM | POA: Insufficient documentation

## 2020-07-30 NOTE — Assessment & Plan Note (Signed)
Reported new streaky  BRBPR 07/29/2020 while on xarelto    Lab Results  Component Value Date   HGB 8.2 Repeated and verified X2. (L) 07/28/2020   HGB 11.0 (L) 09/02/2019   HGB 11.3 08/21/2019   HGB 12.9 07/12/2017     Advised will need to report this to her GI doctor immediately and hold xarelto in  Meantime if worsens at all

## 2020-07-30 NOTE — Assessment & Plan Note (Signed)
Allergy profile 07/12/2017 >  Eos 0.1 /  IgE  436  RAST pos cat > dog > cedar / dust  - trial of gerd diet/ hs h2 rec 07/12/2017   Of the three most common causes of  Sub-acute / recurrent or chronic cough, only one (GERD)  can actually contribute to/ trigger  the other two (asthma and post nasal drip syndrome)  and perpetuate the cylce of cough.  While not intuitively obvious, many patients with chronic low grade reflux do not cough until there is a primary insult that disturbs the protective epithelial barrier and exposes sensitive nerve endings.   This is typically viral but can due to PNDS and  either may apply here.     >>>The point is that once this occurs, it is difficult to eliminate the cycle  using anything but a maximally effective acid suppression regimen at least in the short run, accompanied by an appropriate diet to address non acid GERD and control / eliminate the pnds with 1st gen H1 blockers per guidelines  Then consider adding gabapentin to stop the cycle of throat clearing inducing more gerd inducing more throat clearing  - see avs for instructions unique to this ov

## 2020-07-30 NOTE — Progress Notes (Signed)
Routed to [facility] normal results box

## 2020-08-03 ENCOUNTER — Telehealth: Payer: Self-pay | Admitting: Internal Medicine

## 2020-08-03 NOTE — Telephone Encounter (Signed)
Called spoke to patient let her know Dr. Thurston Hole commendations   Call patient : Studies are unremarkable except for anemia Given hx of streaky brbpr on xarelto will need urgent GI eval and if worsens stop xarelto or go to ER - copy to PCP also with this result note  Routed lab results to PCP. Patient states she isn't have Bright Red Blood anymore and has been monitoring it, but will make an appointment with Dr. Cliffton Asters this week as she does not have a GI doctor.  Nothing further needed

## 2020-08-04 NOTE — Progress Notes (Signed)
Pt aware- see phone note dated 08/03/20

## 2020-08-05 ENCOUNTER — Emergency Department (HOSPITAL_COMMUNITY): Payer: Medicare Other

## 2020-08-05 ENCOUNTER — Other Ambulatory Visit: Payer: Self-pay

## 2020-08-05 ENCOUNTER — Encounter (HOSPITAL_COMMUNITY): Payer: Self-pay

## 2020-08-05 ENCOUNTER — Inpatient Hospital Stay (HOSPITAL_COMMUNITY)
Admission: EM | Admit: 2020-08-05 | Discharge: 2020-08-11 | DRG: 378 | Disposition: A | Discharge status: Home / Self Care | Payer: Medicare Other | Attending: Internal Medicine | Admitting: Internal Medicine

## 2020-08-05 DIAGNOSIS — K2980 Duodenitis without bleeding: Secondary | ICD-10-CM | POA: Diagnosis present

## 2020-08-05 DIAGNOSIS — N179 Acute kidney failure, unspecified: Secondary | ICD-10-CM | POA: Diagnosis not present

## 2020-08-05 DIAGNOSIS — D62 Acute posthemorrhagic anemia: Secondary | ICD-10-CM | POA: Diagnosis not present

## 2020-08-05 DIAGNOSIS — I2510 Atherosclerotic heart disease of native coronary artery without angina pectoris: Secondary | ICD-10-CM | POA: Diagnosis not present

## 2020-08-05 DIAGNOSIS — Z7984 Long term (current) use of oral hypoglycemic drugs: Secondary | ICD-10-CM | POA: Diagnosis not present

## 2020-08-05 DIAGNOSIS — R0902 Hypoxemia: Secondary | ICD-10-CM | POA: Diagnosis not present

## 2020-08-05 DIAGNOSIS — Z20822 Contact with and (suspected) exposure to covid-19: Secondary | ICD-10-CM | POA: Diagnosis present

## 2020-08-05 DIAGNOSIS — Z888 Allergy status to other drugs, medicaments and biological substances status: Secondary | ICD-10-CM | POA: Diagnosis not present

## 2020-08-05 DIAGNOSIS — Z86010 Personal history of colonic polyps: Secondary | ICD-10-CM | POA: Diagnosis not present

## 2020-08-05 DIAGNOSIS — E785 Hyperlipidemia, unspecified: Secondary | ICD-10-CM | POA: Diagnosis not present

## 2020-08-05 DIAGNOSIS — I517 Cardiomegaly: Secondary | ICD-10-CM | POA: Diagnosis not present

## 2020-08-05 DIAGNOSIS — R0602 Shortness of breath: Secondary | ICD-10-CM | POA: Diagnosis not present

## 2020-08-05 DIAGNOSIS — K625 Hemorrhage of anus and rectum: Secondary | ICD-10-CM | POA: Diagnosis not present

## 2020-08-05 DIAGNOSIS — I10 Essential (primary) hypertension: Secondary | ICD-10-CM | POA: Diagnosis present

## 2020-08-05 DIAGNOSIS — K648 Other hemorrhoids: Secondary | ICD-10-CM | POA: Diagnosis not present

## 2020-08-05 DIAGNOSIS — I480 Paroxysmal atrial fibrillation: Secondary | ICD-10-CM | POA: Diagnosis not present

## 2020-08-05 DIAGNOSIS — Z8249 Family history of ischemic heart disease and other diseases of the circulatory system: Secondary | ICD-10-CM | POA: Diagnosis not present

## 2020-08-05 DIAGNOSIS — Z6841 Body Mass Index (BMI) 40.0 and over, adult: Secondary | ICD-10-CM | POA: Diagnosis not present

## 2020-08-05 DIAGNOSIS — I5042 Chronic combined systolic (congestive) and diastolic (congestive) heart failure: Secondary | ICD-10-CM | POA: Diagnosis not present

## 2020-08-05 DIAGNOSIS — J479 Bronchiectasis, uncomplicated: Secondary | ICD-10-CM | POA: Diagnosis not present

## 2020-08-05 DIAGNOSIS — Z79899 Other long term (current) drug therapy: Secondary | ICD-10-CM | POA: Diagnosis not present

## 2020-08-05 DIAGNOSIS — R059 Cough, unspecified: Secondary | ICD-10-CM | POA: Diagnosis not present

## 2020-08-05 DIAGNOSIS — Z86718 Personal history of other venous thrombosis and embolism: Secondary | ICD-10-CM | POA: Diagnosis not present

## 2020-08-05 DIAGNOSIS — E119 Type 2 diabetes mellitus without complications: Secondary | ICD-10-CM | POA: Diagnosis not present

## 2020-08-05 DIAGNOSIS — Z8701 Personal history of pneumonia (recurrent): Secondary | ICD-10-CM | POA: Diagnosis not present

## 2020-08-05 DIAGNOSIS — K921 Melena: Principal | ICD-10-CM | POA: Diagnosis present

## 2020-08-05 DIAGNOSIS — I4819 Other persistent atrial fibrillation: Secondary | ICD-10-CM | POA: Diagnosis not present

## 2020-08-05 DIAGNOSIS — E66812 Obesity, class 2: Secondary | ICD-10-CM | POA: Diagnosis present

## 2020-08-05 DIAGNOSIS — D649 Anemia, unspecified: Secondary | ICD-10-CM | POA: Diagnosis not present

## 2020-08-05 DIAGNOSIS — J84112 Idiopathic pulmonary fibrosis: Secondary | ICD-10-CM | POA: Diagnosis not present

## 2020-08-05 DIAGNOSIS — E1169 Type 2 diabetes mellitus with other specified complication: Secondary | ICD-10-CM

## 2020-08-05 DIAGNOSIS — Z7901 Long term (current) use of anticoagulants: Secondary | ICD-10-CM | POA: Diagnosis not present

## 2020-08-05 DIAGNOSIS — D500 Iron deficiency anemia secondary to blood loss (chronic): Secondary | ICD-10-CM | POA: Diagnosis not present

## 2020-08-05 DIAGNOSIS — K219 Gastro-esophageal reflux disease without esophagitis: Secondary | ICD-10-CM | POA: Diagnosis not present

## 2020-08-05 DIAGNOSIS — I110 Hypertensive heart disease with heart failure: Secondary | ICD-10-CM | POA: Diagnosis not present

## 2020-08-05 DIAGNOSIS — D509 Iron deficiency anemia, unspecified: Secondary | ICD-10-CM | POA: Diagnosis not present

## 2020-08-05 DIAGNOSIS — I4891 Unspecified atrial fibrillation: Secondary | ICD-10-CM | POA: Diagnosis present

## 2020-08-05 LAB — COMPREHENSIVE METABOLIC PANEL
ALT: 12 U/L (ref 0–44)
AST: 16 U/L (ref 15–41)
Albumin: 3.2 g/dL — ABNORMAL LOW (ref 3.5–5.0)
Alkaline Phosphatase: 50 U/L (ref 38–126)
Anion gap: 13 (ref 5–15)
BUN: 26 mg/dL — ABNORMAL HIGH (ref 8–23)
CO2: 19 mmol/L — ABNORMAL LOW (ref 22–32)
Calcium: 9.1 mg/dL (ref 8.9–10.3)
Chloride: 102 mmol/L (ref 98–111)
Creatinine, Ser: 1.17 mg/dL — ABNORMAL HIGH (ref 0.44–1.00)
GFR, Estimated: 45 mL/min — ABNORMAL LOW (ref >60)
Glucose, Bld: 107 mg/dL — ABNORMAL HIGH (ref 70–99)
Potassium: 4.6 mmol/L (ref 3.5–5.1)
Sodium: 134 mmol/L — ABNORMAL LOW (ref 135–145)
Total Bilirubin: 0.8 mg/dL (ref 0.3–1.2)
Total Protein: 6.6 g/dL (ref 6.5–8.1)

## 2020-08-05 LAB — PROTIME-INR
INR: 2.6 — ABNORMAL HIGH (ref 0.8–1.2)
Prothrombin Time: 26.8 seconds — ABNORMAL HIGH (ref 11.4–15.2)

## 2020-08-05 LAB — URINALYSIS, ROUTINE W REFLEX MICROSCOPIC
Bilirubin Urine: NEGATIVE
Glucose, UA: NEGATIVE mg/dL
Hgb urine dipstick: NEGATIVE
Ketones, ur: NEGATIVE mg/dL
Leukocytes,Ua: NEGATIVE
Nitrite: NEGATIVE
Protein, ur: NEGATIVE mg/dL
Specific Gravity, Urine: 1.006 (ref 1.005–1.030)
pH: 6 (ref 5.0–8.0)

## 2020-08-05 LAB — PREPARE RBC (CROSSMATCH)

## 2020-08-05 LAB — CBC WITH DIFFERENTIAL/PLATELET
Abs Immature Granulocytes: 0.03 10*3/uL (ref 0.00–0.07)
Basophils Absolute: 0 10*3/uL (ref 0.0–0.1)
Basophils Relative: 1 %
Eosinophils Absolute: 0.1 10*3/uL (ref 0.0–0.5)
Eosinophils Relative: 2 %
HCT: 22.1 % — ABNORMAL LOW (ref 36.0–46.0)
Hemoglobin: 6.5 g/dL — CL (ref 12.0–15.0)
Immature Granulocytes: 1 %
Lymphocytes Relative: 19 %
Lymphs Abs: 1.1 10*3/uL (ref 0.7–4.0)
MCH: 27.7 pg (ref 26.0–34.0)
MCHC: 29.4 g/dL — ABNORMAL LOW (ref 30.0–36.0)
MCV: 94 fL (ref 80.0–100.0)
Monocytes Absolute: 0.7 10*3/uL (ref 0.1–1.0)
Monocytes Relative: 11 %
Neutro Abs: 4.1 10*3/uL (ref 1.7–7.7)
Neutrophils Relative %: 66 %
Platelets: 305 10*3/uL (ref 150–400)
RBC: 2.35 MIL/uL — ABNORMAL LOW (ref 3.87–5.11)
RDW: 14.6 % (ref 11.5–15.5)
WBC: 6 10*3/uL (ref 4.0–10.5)
nRBC: 0.5 % — ABNORMAL HIGH (ref 0.0–0.2)

## 2020-08-05 LAB — RESPIRATORY PANEL BY RT PCR (FLU A&B, COVID)
Influenza A by PCR: NEGATIVE
Influenza B by PCR: NEGATIVE
SARS Coronavirus 2 by RT PCR: NEGATIVE

## 2020-08-05 LAB — MAGNESIUM: Magnesium: 1.7 mg/dL (ref 1.7–2.4)

## 2020-08-05 LAB — CBG MONITORING, ED
Glucose-Capillary: 99 mg/dL (ref 70–99)
Glucose-Capillary: 137 mg/dL — ABNORMAL HIGH (ref 70–99)

## 2020-08-05 LAB — ABO/RH: ABO/RH(D): A POS

## 2020-08-05 LAB — POC OCCULT BLOOD, ED: Fecal Occult Bld: NEGATIVE

## 2020-08-05 MED ORDER — ONDANSETRON HCL 4 MG PO TABS: 4.0000 mg | ORAL_TABLET | Freq: Four times a day (QID) | ORAL | Status: DC | PRN

## 2020-08-05 MED ORDER — ACETAMINOPHEN 650 MG RE SUPP: 650.0000 mg | Freq: Four times a day (QID) | RECTAL | Status: DC | PRN

## 2020-08-05 MED ORDER — SODIUM CHLORIDE 0.9 % IV SOLN
8.0000 mg/h | INTRAVENOUS | Status: DC
  Administered 2020-08-05 (×2): 8 mg/h via INTRAVENOUS
  Filled 2020-08-05 (×3): qty 80

## 2020-08-05 MED ORDER — ACETAMINOPHEN 325 MG PO TABS: 650.0000 mg | ORAL_TABLET | Freq: Four times a day (QID) | ORAL | Status: DC | PRN

## 2020-08-05 MED ORDER — SODIUM CHLORIDE 0.9 % IV SOLN
80.0000 mg | Freq: Once | INTRAVENOUS | Status: AC
  Administered 2020-08-05: 80 mg via INTRAVENOUS
  Filled 2020-08-05: qty 80

## 2020-08-05 MED ORDER — DOFETILIDE 500 MCG PO CAPS: 500.0000 ug | ORAL_CAPSULE | Freq: Two times a day (BID) | ORAL | Status: DC

## 2020-08-05 MED ORDER — SODIUM CHLORIDE 0.9 % IV SOLN: INTRAVENOUS | Status: DC

## 2020-08-05 MED ORDER — ONDANSETRON HCL 4 MG/2ML IJ SOLN: 4.0000 mg | Freq: Four times a day (QID) | INTRAMUSCULAR | Status: DC | PRN

## 2020-08-05 MED ORDER — INSULIN ASPART 100 UNIT/ML ~~LOC~~ SOLN
0.0000 [IU] | Freq: Every day | SUBCUTANEOUS | Status: DC
  Administered 2020-08-10: 3 [IU] via SUBCUTANEOUS

## 2020-08-05 MED ORDER — INSULIN ASPART 100 UNIT/ML ~~LOC~~ SOLN
0.0000 [IU] | Freq: Three times a day (TID) | SUBCUTANEOUS | Status: DC
  Administered 2020-08-05: 2 [IU] via SUBCUTANEOUS
  Administered 2020-08-07 – 2020-08-08 (×2): 3 [IU] via SUBCUTANEOUS
  Administered 2020-08-08 – 2020-08-10 (×2): 2 [IU] via SUBCUTANEOUS
  Administered 2020-08-10: 3 [IU] via SUBCUTANEOUS
  Administered 2020-08-11 (×2): 2 [IU] via SUBCUTANEOUS

## 2020-08-05 NOTE — H&P (Signed)
History and Physical    Carrie Haynes LYY:[id number] DOB: June 23, 1941 DOA: 08/05/2020  PCP: Laurann Montana, MD  Patient coming from: Home  I have personally briefly reviewed patient's old medical records in [facility]  Chief Complaint: Exertional shortness of breath since 1 month  HPI: Carrie Haynes is a 79 y.o. female with medical history significant of hypertension, hyperlipidemia, type 2 diabetes mellitus, paroxysmal A. fib-on Xarelto s/p DCCV on 08/2019, chronic combined CHF, DVT, morbid obesity, GERD presents to emergency department with exertional shortness of breath since 1 month.  Patient tells me that she has exertional shortness of breath and blood per rectum since 1 month.  She was seen by her PCP, cardiology last month and then pulmonologist last week and her hemoglobin noted to dropped from 11 on 09/02/2019 to 8.2 07/28/20.  Reports that she has noticed bright red per rectum mixed with stool on and off since 1 month.  Denies chest pain, epigastric burning, nausea, vomiting, abdominal pain, over-the-counter NSAID use, family history of colon cancer, weight loss or night sweats.  She took Xarelto last night at 10:00 pm.  Denies headache, blurry vision, orthopnea, PND, leg swelling, fever, chills, cough, congestion, urinary symptoms.  No history of smoking, alcohol, illicit drug use.  ED Course: Upon arrival to ED: Patient tachypneic, afebrile, H&H: 6.5/22.1 was 8.2/25.0 8 days ago.  CMP shows AKI, COVID-19 negative, occult blood negative, chest x-ray negative for acute findings.  EDP ordered 2 unit PRBC and started on IV Protonix gtt.  Consulted GI for further evaluation.  [facility] hospitalist consulted for admission due to symptomatic anemia.  Review of Systems: As per HPI otherwise negative.    Past Medical History:  Diagnosis Date  . A-fib (HCC)   . Diabetes mellitus without complication (HCC)   . Hyperlipemia   . Hypertension     Past Surgical History:   Procedure Laterality Date  . CARDIOVERSION N/A 05/18/2017   Procedure: CARDIOVERSION;  Surgeon: Wendall Stade, MD;  Location: [facility] ENDOSCOPY;  Service: Cardiovascular;  Laterality: N/A;  . CARDIOVERSION N/A 05/23/2017   Procedure: CARDIOVERSION;  Surgeon: Quintella Reichert, MD;  Location: [facility] ENDOSCOPY;  Service: Cardiovascular;  Laterality: N/A;  . CARDIOVERSION N/A 08/28/2019   Procedure: CARDIOVERSION;  Surgeon: Jake Bathe, MD;  Location: [facility] ENDOSCOPY;  Service: Cardiovascular;  Laterality: N/A;  . RIGHT/LEFT HEART CATH AND CORONARY ANGIOGRAPHY N/A 05/12/2017   Procedure: Right/Left Heart Cath and Coronary Angiography;  Surgeon: Lyn Records, MD;  Location: [facility] INVASIVE CV LAB;  Service: Cardiovascular;  Laterality: N/A;  . TEE WITHOUT CARDIOVERSION N/A 05/18/2017   Procedure: TRANSESOPHAGEAL ECHOCARDIOGRAM (TEE);  Surgeon: Wendall Stade, MD;  Location: [facility] ENDOSCOPY;  Service: Cardiovascular;  Laterality: N/A;     reports that she has never smoked. She has never used smokeless tobacco. She reports that she does not drink alcohol and does not use drugs.  Allergies  Allergen Reactions  . Calcium Channel Blockers Other (See Comments)    Acute hypotension, patient became brady/asystole < 5 seconds    Family History  Problem Relation Age of Onset  . Hypertension Father   . Heart attack Sister   . Breast cancer Other     Prior to Admission medications   Medication Sig Start Date End Date Taking? Authorizing Provider  atorvastatin (LIPITOR) 20 MG tablet Take 20 mg by mouth every evening. 05/03/17   [provider]  Cholecalciferol (VITAMIN D-3) 5000 units TABS Take 5,000 Units by mouth daily.  [provider]  dofetilide (TIKOSYN) 500 MCG capsule TAKE 1 CAPSULE BY MOUTH TWICE A DAY 03/25/20   Croitoru, Mihai, MD  FREESTYLE PRECISION NEO TEST test strip USE AS DIRECTED TO TEST BLOOD SUGAR DAILY E11.21 09/06/19   [provider]  losartan (COZAAR) 50 MG tablet  Take 50 mg by mouth daily. 12/09/19   [provider]  Magnesium Oxide 400 MG CAPS Take 1 capsule (400 mg total) by mouth 2 (two) times daily. 01/21/20   Fenton, Clint R, PA  metFORMIN (GLUCOPHAGE-XR) 500 MG 24 hr tablet Take 2,000 mg by mouth at bedtime.    [provider]  metoprolol succinate (TOPROL-XL) 50 MG 24 hr tablet Take 1 tablet (50 mg total) by mouth daily. Take with or immediately following a meal. 07/15/20   Croitoru, Mihai, MD  omeprazole (PRILOSEC) 40 MG capsule Take 40 mg by mouth every morning. 12/26/19   [provider]  XARELTO 20 MG TABS tablet TAKE 1 TABLET (20 MG TOTAL) BY MOUTH DAILY WITH SUPPER. 06/25/20   Croitoru, Rachelle Hora, MD    Physical Exam: Vitals:   08/05/20 0850 08/05/20 0855 08/05/20 0915  BP: (!) 113/45  (!) 105/48  Pulse: 78  93  Resp: (!) 22  (!) 24  Temp: 98.9 F (37.2 C)    TempSrc: Oral    SpO2: 97%  100%  Weight:  103.9 kg   Height:  5' (1.524 m)     Constitutional: NAD, calm, comfortable, obese, communicating well Eyes: PERRL, lids and conjunctivae: Pale ENMT: Mucous membranes are moist. Posterior pharynx clear of any exudate or lesions.Normal dentition.  Neck: normal, supple, no masses, no thyromegaly Respiratory:clear to auscultation bilaterally, no wheezing, no crackles. Normal respiratory effort. No accessory muscle use.  Cardiovascular: Regular rate and rhythm, no murmurs / rubs / gallops. No extremity edema. 2+ pedal pulses. No carotid bruits.  Abdomen: no tenderness, no masses palpated. No hepatosplenomegaly. Bowel sounds positive.  Musculoskeletal: no clubbing / cyanosis. No joint deformity upper and lower extremities. Good ROM, no contractures. Normal muscle tone.  Skin: no rashes, lesions, ulcers. No induration Neurologic: CN 2-12 grossly intact. Sensation intact, DTR normal. Strength 5/5 in all 4.  Psychiatric: Normal judgment and insight. Alert and oriented x 3. Normal mood.    Labs on Admission: I have  personally reviewed following labs and imaging studies  CBC: Recent Labs  Lab 08/05/20 0906  WBC 6.0  NEUTROABS 4.1  HGB 6.5*  HCT 22.1*  MCV 94.0  PLT 305   Basic Metabolic Panel: Recent Labs  Lab 08/05/20 0906  NA 134*  K 4.6  CL 102  CO2 19*  GLUCOSE 107*  BUN 26*  CREATININE 1.17*  CALCIUM 9.1   GFR: Estimated Creatinine Clearance: 44.5 mL/min (A) (by C-G formula based on SCr of 1.17 mg/dL (H)). Liver Function Tests: Recent Labs  Lab 08/05/20 0906  AST 16  ALT 12  ALKPHOS 50  BILITOT 0.8  PROT 6.6  ALBUMIN 3.2*   No results for input(s): LIPASE, AMYLASE in the last 168 hours. No results for input(s): AMMONIA in the last 168 hours. Coagulation Profile: Recent Labs  Lab 08/05/20 0906  INR 2.6*   Cardiac Enzymes: No results for input(s): CKTOTAL, CKMB, CKMBINDEX, TROPONINI in the last 168 hours. BNP (last 3 results) Recent Labs    07/28/20 1517  PROBNP 370.0*   HbA1C: No results for input(s): HGBA1C in the last 72 hours. CBG: No results for input(s): GLUCAP in the last 168 hours.  Lipid Profile: No results for input(s): CHOL, HDL, LDLCALC, TRIG, CHOLHDL, LDLDIRECT in the last 72 hours. Thyroid Function Tests: No results for input(s): TSH, T4TOTAL, FREET4, T3FREE, THYROIDAB in the last 72 hours. Anemia Panel: No results for input(s): VITAMINB12, FOLATE, FERRITIN, TIBC, IRON, RETICCTPCT in the last 72 hours. Urine analysis: No results found for: COLORURINE, APPEARANCEUR, LABSPEC, PHURINE, GLUCOSEU, HGBUR, BILIRUBINUR, KETONESUR, PROTEINUR, UROBILINOGEN, NITRITE, LEUKOCYTESUR  Radiological Exams on Admission: DG Chest 2 View  Result Date: 08/05/2020 CLINICAL DATA:  Cough and shortness of breath EXAM: CHEST - 2 VIEW COMPARISON:  07/28/2020 FINDINGS: Chronic interstitial prominence. No focal consolidation. No pleural effusion or pneumothorax. Stable cardiomegaly. No acute osseous abnormality IMPRESSION: No acute process in the chest.  Chronic  interstitial lung disease. Stable cardiomegaly. Electronically Signed   By: Guadlupe Spanish M.D.   On: 08/05/2020 10:15   EKG: Sinus rhythm, left axis deviation, borderline T wave abnormalities in lateral leads.  Prolonged QT interval.  Assessment/Plan Principal Problem:   Symptomatic anemia Active Problems:   A-fib (HCC)   Morbid obesity due to excess calories (HCC)   Hypertension   Hyperlipemia   Diabetes mellitus without complication (HCC)   Symptomatic anemia: -Patient presented with exertional shortness of breath since 1 month.  Hemoglobin dropped significantly from 12-6.5 in the last 6 months.  Reports occasional blood per rectum. -POC occult blood: Negative -EDP ordered 2 unit PRBC and started on IV Protonix. -EDP consulted GI-await recommendations. -Hold Xarelto.  Continue to monitor vitals and H&H closely. -Avoid NSAIDs.  Continue Protonix -Zofran as needed for nausea and vomiting  AKI:  -Likely secondary to prerenal due to blood loss.  Hold nephrotoxic medication. -Transfuse 2 unit PRBC.  Monitor kidney function closely  Hypertension: Blood pressure is on lower side -We will hold home p.o. meds-losartan and metoprolol for now.  Monitor blood pressure closely and resume home p.o. meds once blood pressure is back to baseline  Type 2 diabetes mellitus: Check A1c -Hold Metformin.  Start patient on sliding scale insulin and monitor blood sugar closely  Paroxysmal A. fib: Rate controlled.  S/p DCCV -Hold metoprolol due to hypotension and Xarelto due to GI bleed, hold dofetilide due to prolonged QT interval -Monitor heart rate closely on telemetry.  Prolonged QT interval: Noted on EKG.  Check magnesium level.  Replace as needed.  Morbid obesity with BMI of 44: -Diet modification/exercise and weight loss recommended  DVT prophylaxis: SCD, no chemical anticoagulation due to active bleeding  Code Status: Full code Family Communication: Patient's daughter present at bedside.   Plan of care discussed with patient in length and she verbalized understanding and agreed with it. Disposition Plan: Home after GI evaluation Consults called: GI by EDP Admission status: Inpatient   Ollen Bowl MD [facility]  If 7PM-7AM, please contact night-coverage [email or web address]  08/05/2020, 10:50 AM

## 2020-08-05 NOTE — ED Notes (Signed)
Called and ordered lunch. 

## 2020-08-05 NOTE — Consult Note (Signed)
Referring Provider: ED Primary Care Physician:  Laurann Montana, MD Primary Gastroenterologist:  Enid Baas (former patient of Dr. Randa Evens)  Reason for Consultation:  Symptomatic anemia, rectal bleeding  HPI: Carrie Haynes is a 79 y.o. female with history of A. fib (on Xarelto), CHF, DVT, and type II DM presenting with symptomatic anemia and intermittent rectal bleeding.  Patient reports worsening shortness of breath over the last month. She has shortness of breath at rest, though worsened with exertion. Also reports fatigue. Denies any chest pain, dizziness, syncope.  She reports small amounts of bright red blood in her stool, which has been occurring for approximately 1 month. States blood appears to be mixed with the stool, and she does not note any blood on the toilet paper.  Also reports that her stool has recently seemed darker than normal, close to black.  Stools have been smaller and thinner lately, but she denies diarrhea or constipation.  Denies any abdominal pain, nausea, vomiting, early satiety, changes in appetite, unexplained weight loss, GERD, dysphagia.  She is on Xarelto for A. fib, last dose yesterday 10/19.  Denies any aspirin or NSAID use.  Denies any family history of colon cancer or gastrointestinal malignancy.  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 A-fib (HCC)   . Diabetes mellitus without complication (HCC)   . Hyperlipemia   . Hypertension     Past Surgical History:  Procedure Laterality Date  . CARDIOVERSION N/A 05/18/2017   Procedure: CARDIOVERSION;  Surgeon: Wendall Stade, MD;  Location: [facility] ENDOSCOPY;  Service: Cardiovascular;  Laterality: N/A;  . CARDIOVERSION N/A 05/23/2017   Procedure: CARDIOVERSION;  Surgeon: Quintella Reichert, MD;  Location: [facility] ENDOSCOPY;  Service: Cardiovascular;  Laterality: N/A;  . CARDIOVERSION N/A 08/28/2019    Procedure: CARDIOVERSION;  Surgeon: Jake Bathe, MD;  Location: [facility] ENDOSCOPY;  Service: Cardiovascular;  Laterality: N/A;  . RIGHT/LEFT HEART CATH AND CORONARY ANGIOGRAPHY N/A 05/12/2017   Procedure: Right/Left Heart Cath and Coronary Angiography;  Surgeon: Lyn Records, MD;  Location: [facility] INVASIVE CV LAB;  Service: Cardiovascular;  Laterality: N/A;  . TEE WITHOUT CARDIOVERSION N/A 05/18/2017   Procedure: TRANSESOPHAGEAL ECHOCARDIOGRAM (TEE);  Surgeon: Wendall Stade, MD;  Location: [facility] ENDOSCOPY;  Service: Cardiovascular;  Laterality: N/A;    Prior to Admission medications   Medication Sig Start Date End Date Taking? Authorizing Provider  cetirizine (ZYRTEC) 10 MG tablet Take 10 mg by mouth daily as needed for allergies.   Yes [provider]  Cholecalciferol (VITAMIN D-3) 5000 units TABS Take 5,000 Units by mouth daily.   Yes [provider]  dofetilide (TIKOSYN) 500 MCG capsule TAKE 1 CAPSULE BY MOUTH TWICE A DAY Patient taking differently: Take 500 mcg by mouth 2 (two) times daily.  03/25/20  Yes Croitoru, Mihai, MD  losartan (COZAAR) 50 MG tablet Take 50 mg by mouth daily. 12/09/19  Yes [provider]  Magnesium Oxide 400 MG CAPS Take 1 capsule (400 mg total) by mouth 2 (two) times daily. Patient taking differently: Take 800 mg by mouth daily.  01/21/20  Yes Fenton, Clint R, PA  metFORMIN (GLUCOPHAGE-XR) 500 MG 24 hr tablet Take 1,500 mg by mouth at bedtime.    Yes [provider]  metoprolol succinate (TOPROL-XL) 50 MG 24 hr tablet Take 1 tablet (50 mg total) by mouth daily. Take with or immediately following a meal. 07/15/20  Yes Croitoru, Mihai,  MD  omeprazole (PRILOSEC) 40 MG capsule Take 40 mg by mouth every morning. 12/26/19  Yes [provider]  XARELTO 20 MG TABS tablet TAKE 1 TABLET (20 MG TOTAL) BY MOUTH DAILY WITH SUPPER. Patient taking differently: Take 20 mg by mouth daily with supper.  06/25/20  Yes Croitoru, Mihai, MD  FREESTYLE PRECISION  NEO TEST test strip USE AS DIRECTED TO TEST BLOOD SUGAR DAILY E11.21 09/06/19   [provider]    Scheduled Meds: . insulin aspart  0-15 Units Subcutaneous TID WC  . insulin aspart  0-5 Units Subcutaneous QHS   Continuous Infusions: . sodium chloride 125 mL/hr at 08/05/20 1223  . pantoprozole (PROTONIX) infusion 8 mg/hr (08/05/20 1245)   PRN Meds:.acetaminophen **OR** acetaminophen, ondansetron **OR** ondansetron (ZOFRAN) IV  Allergies as of 08/05/2020 - Review Complete 08/05/2020  Allergen Reaction Noted  . Calcium channel blockers Other (See Comments) 05/17/2017    Family History  Problem Relation Age of Onset  . Hypertension Father   . Heart attack Sister   . Breast cancer Other     Social History   Socioeconomic History  . Marital status: Widowed    Spouse name: Not on file  . Number of children: Not on file  . Years of education: Not on file  . Highest education level: Not on file  Occupational History  . Not on file  Tobacco Use  . Smoking status: Never Smoker  . Smokeless tobacco: Never Used  Substance and Sexual Activity  . Alcohol use: No  . Drug use: No  . Sexual activity: Not on file  Other Topics Concern  . Not on file  Social History Narrative  . Not on file   Social Determinants of Health   Financial Resource Strain:   . Difficulty of Paying Living Expenses: Not on file  Food Insecurity:   . Worried About Programme researcher, broadcasting/film/video in the Last Year: Not on file  . Ran Out of Food in the Last Year: Not on file  Transportation Needs:   . Lack of Transportation (Medical): Not on file  . Lack of Transportation (Non-Medical): Not on file  Physical Activity:   . Days of Exercise per Week: Not on file  . Minutes of Exercise per Session: Not on file  Stress:   . Feeling of Stress : Not on file  Social Connections:   . Frequency of Communication with Friends and Family: Not on file  . Frequency of Social Gatherings with Friends and Family: Not  on file  . Attends Religious Services: Not on file  . Active Member of Clubs or Organizations: Not on file  . Attends Banker Meetings: Not on file  . Marital Status: Not on file  Intimate Partner Violence:   . Fear of Current or Ex-Partner: Not on file  . Emotionally Abused: Not on file  . Physically Abused: Not on file  . Sexually Abused: Not on file    Review of Systems: Review of Systems  Constitutional: Positive for malaise/fatigue. Negative for chills, fever and weight loss.  HENT: Negative for hearing loss and tinnitus.   Eyes: Negative for pain and redness.  Respiratory: Positive for cough and shortness of breath.   Cardiovascular: Negative for chest pain and palpitations.  Gastrointestinal: Positive for blood in stool. Negative for abdominal pain, constipation, diarrhea, heartburn, melena, nausea and vomiting.  Genitourinary: Negative for flank pain and hematuria.  Musculoskeletal: Negative for falls and joint pain.  Skin: Negative for itching  and rash.  Neurological: Negative for seizures and loss of consciousness.  Endo/Heme/Allergies: Negative for polydipsia. Does not bruise/bleed easily.  Psychiatric/Behavioral: Negative for substance abuse. The patient is not nervous/anxious.      Physical Exam: Vital signs: Vitals:   08/05/20 1215 08/05/20 1230  BP: (!) 97/46 (!) 104/43  Pulse: 61 60  Resp: 16 19  Temp: 98.1 F (36.7 C) 98.1 F (36.7 C)  SpO2: 98% 97%     Physical Exam Vitals reviewed.  Constitutional:      General: She is not in acute distress.    Appearance: She is obese.  HENT:     Head: Normocephalic and atraumatic.     Nose: Nose normal.     Mouth/Throat:     Mouth: Mucous membranes are moist.     Pharynx: Oropharynx is clear.  Eyes:     General: No scleral icterus.    Extraocular Movements: Extraocular movements intact.     Comments: Conjunctival pallor  Cardiovascular:     Rate and Rhythm: Normal rate and regular rhythm.      Pulses: Normal pulses.     Heart sounds: Normal heart sounds.  Pulmonary:     Effort: Pulmonary effort is normal.     Breath sounds: Normal breath sounds.  Abdominal:     General: Bowel sounds are normal. There is no distension.     Palpations: Abdomen is soft. There is no mass.     Tenderness: There is no abdominal tenderness. There is no guarding or rebound.     Hernia: A hernia (umbilical) is present.  Musculoskeletal:        General: No swelling or tenderness.     Cervical back: Normal range of motion and neck supple.  Skin:    General: Skin is warm and dry.  Neurological:     General: No focal deficit present.     Mental Status: She is alert and oriented to person, place, and time.  Psychiatric:        Mood and Affect: Mood normal.        Behavior: Behavior normal.      GI:  Lab Results: Recent Labs    08/05/20 0906  WBC 6.0  HGB 6.5*  HCT 22.1*  PLT 305   BMET Recent Labs    08/05/20 0906  NA 134*  K 4.6  CL 102  CO2 19*  GLUCOSE 107*  BUN 26*  CREATININE 1.17*  CALCIUM 9.1   LFT Recent Labs    08/05/20 0906  PROT 6.6  ALBUMIN 3.2*  AST 16  ALT 12  ALKPHOS 50  BILITOT 0.8   PT/INR Recent Labs    08/05/20 0906  LABPROT 26.8*  INR 2.6*     Studies/Results: DG Chest 2 View  Result Date: 08/05/2020 CLINICAL DATA:  Cough and shortness of breath EXAM: CHEST - 2 VIEW COMPARISON:  07/28/2020 FINDINGS: Chronic interstitial prominence. No focal consolidation. No pleural effusion or pneumothorax. Stable cardiomegaly. No acute osseous abnormality IMPRESSION: No acute process in the chest.  Chronic interstitial lung disease. Stable cardiomegaly. Electronically Signed   By: Guadlupe Spanish M.D.   On: 08/05/2020 10:15    Impression: Rectal bleeding and symptomatic anemia: Hemoglobin 6.5 today, decreased from 11.3 on 12.3 as of 06/08/2020. -Small amount of red blood mixed in the stool intermittently, though amount of blood does not seem to correlate  with large drop in hemoglobin.  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 -BUN 26/Cr 1.17, stable as compared to baseline BUN 28/Cr 1.11 as of 06/08/2020 -INR elevated to 2.6  A. fib, on Xarelto, last dose 08/04/2020  CHF, EF 55-60% as of echo 10/2019  History of DVT  Plan: Recommend proceeding with EGD and colonoscopy on Friday 10/22.  I thoroughly discussed the procedure with the patient to include nature, alternatives, benefits, and risks (including but not limited to bleeding, infection, perforation, anesthesia/cardiac and pulmonary complications).  Patient verbalized understanding and gave verbal consent to proceed with EGD and colonoscopy.  Continue Protonix IV.  Continue to hold Xarelto.  Continue to monitor H&H with transfusion as needed to maintain hemoglobin greater than 7-8.  Regular diet OK today since patient does not currently have evidence of active bleeding.  Clear liquid diet tomorrow with plans for colonoscopy prep tomorrow.  We will repeat INR tomorrow as well.  [facility] will follow.   LOS: 0 days   Edrick Kins  PA-C 08/05/2020, 1:02 PM  Contact #  [phone]

## 2020-08-05 NOTE — H&P (View-Only) (Signed)
Referring Provider: ED Primary Care Physician:  Laurann Montana, MD Primary Gastroenterologist:  Enid Baas (former patient of Dr. Randa Evens)  Reason for Consultation:  Symptomatic anemia, rectal bleeding  HPI: Carrie Haynes is a 79 y.o. female with history of A. fib (on Xarelto), CHF, DVT, and type II DM presenting with symptomatic anemia and intermittent rectal bleeding.  Patient reports worsening shortness of breath over the last month. She has shortness of breath at rest, though worsened with exertion. Also reports fatigue. Denies any chest pain, dizziness, syncope.  She reports small amounts of bright red blood in her stool, which has been occurring for approximately 1 month. States blood appears to be mixed with the stool, and she does not note any blood on the toilet paper.  Also reports that her stool has recently seemed darker than normal, close to black.  Stools have been smaller and thinner lately, but she denies diarrhea or constipation.  Denies any abdominal pain, nausea, vomiting, early satiety, changes in appetite, unexplained weight loss, GERD, dysphagia.  She is on Xarelto for A. fib, last dose yesterday 10/19.  Denies any aspirin or NSAID use.  Denies any family history of colon cancer or gastrointestinal malignancy.  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 A-fib (HCC)   . Diabetes mellitus without complication (HCC)   . Hyperlipemia   . Hypertension     Past Surgical History:  Procedure Laterality Date  . CARDIOVERSION N/A 05/18/2017   Procedure: CARDIOVERSION;  Surgeon: Wendall Stade, MD;  Location: [facility] ENDOSCOPY;  Service: Cardiovascular;  Laterality: N/A;  . CARDIOVERSION N/A 05/23/2017   Procedure: CARDIOVERSION;  Surgeon: Quintella Reichert, MD;  Location: [facility] ENDOSCOPY;  Service: Cardiovascular;  Laterality: N/A;  . CARDIOVERSION N/A 08/28/2019    Procedure: CARDIOVERSION;  Surgeon: Jake Bathe, MD;  Location: [facility] ENDOSCOPY;  Service: Cardiovascular;  Laterality: N/A;  . RIGHT/LEFT HEART CATH AND CORONARY ANGIOGRAPHY N/A 05/12/2017   Procedure: Right/Left Heart Cath and Coronary Angiography;  Surgeon: Lyn Records, MD;  Location: [facility] INVASIVE CV LAB;  Service: Cardiovascular;  Laterality: N/A;  . TEE WITHOUT CARDIOVERSION N/A 05/18/2017   Procedure: TRANSESOPHAGEAL ECHOCARDIOGRAM (TEE);  Surgeon: Wendall Stade, MD;  Location: [facility] ENDOSCOPY;  Service: Cardiovascular;  Laterality: N/A;    Prior to Admission medications   Medication Sig Start Date End Date Taking? Authorizing Provider  cetirizine (ZYRTEC) 10 MG tablet Take 10 mg by mouth daily as needed for allergies.   Yes [provider]  Cholecalciferol (VITAMIN D-3) 5000 units TABS Take 5,000 Units by mouth daily.   Yes [provider]  dofetilide (TIKOSYN) 500 MCG capsule TAKE 1 CAPSULE BY MOUTH TWICE A DAY Patient taking differently: Take 500 mcg by mouth 2 (two) times daily.  03/25/20  Yes Croitoru, Mihai, MD  losartan (COZAAR) 50 MG tablet Take 50 mg by mouth daily. 12/09/19  Yes [provider]  Magnesium Oxide 400 MG CAPS Take 1 capsule (400 mg total) by mouth 2 (two) times daily. Patient taking differently: Take 800 mg by mouth daily.  01/21/20  Yes Fenton, Clint R, PA  metFORMIN (GLUCOPHAGE-XR) 500 MG 24 hr tablet Take 1,500 mg by mouth at bedtime.    Yes [provider]  metoprolol succinate (TOPROL-XL) 50 MG 24 hr tablet Take 1 tablet (50 mg total) by mouth daily. Take with or immediately following a meal. 07/15/20  Yes Croitoru, Mihai,  MD  omeprazole (PRILOSEC) 40 MG capsule Take 40 mg by mouth every morning. 12/26/19  Yes [provider]  XARELTO 20 MG TABS tablet TAKE 1 TABLET (20 MG TOTAL) BY MOUTH DAILY WITH SUPPER. Patient taking differently: Take 20 mg by mouth daily with supper.  06/25/20  Yes Croitoru, Mihai, MD  FREESTYLE PRECISION  NEO TEST test strip USE AS DIRECTED TO TEST BLOOD SUGAR DAILY E11.21 09/06/19   [provider]    Scheduled Meds: . insulin aspart  0-15 Units Subcutaneous TID WC  . insulin aspart  0-5 Units Subcutaneous QHS   Continuous Infusions: . sodium chloride 125 mL/hr at 08/05/20 1223  . pantoprozole (PROTONIX) infusion 8 mg/hr (08/05/20 1245)   PRN Meds:.acetaminophen **OR** acetaminophen, ondansetron **OR** ondansetron (ZOFRAN) IV  Allergies as of 08/05/2020 - Review Complete 08/05/2020  Allergen Reaction Noted  . Calcium channel blockers Other (See Comments) 05/17/2017    Family History  Problem Relation Age of Onset  . Hypertension Father   . Heart attack Sister   . Breast cancer Other     Social History   Socioeconomic History  . Marital status: Widowed    Spouse name: Not on file  . Number of children: Not on file  . Years of education: Not on file  . Highest education level: Not on file  Occupational History  . Not on file  Tobacco Use  . Smoking status: Never Smoker  . Smokeless tobacco: Never Used  Substance and Sexual Activity  . Alcohol use: No  . Drug use: No  . Sexual activity: Not on file  Other Topics Concern  . Not on file  Social History Narrative  . Not on file   Social Determinants of Health   Financial Resource Strain:   . Difficulty of Paying Living Expenses: Not on file  Food Insecurity:   . Worried About Running Out of Food in the Last Year: Not on file  . Ran Out of Food in the Last Year: Not on file  Transportation Needs:   . Lack of Transportation (Medical): Not on file  . Lack of Transportation (Non-Medical): Not on file  Physical Activity:   . Days of Exercise per Week: Not on file  . Minutes of Exercise per Session: Not on file  Stress:   . Feeling of Stress : Not on file  Social Connections:   . Frequency of Communication with Friends and Family: Not on file  . Frequency of Social Gatherings with Friends and Family: Not  on file  . Attends Religious Services: Not on file  . Active Member of Clubs or Organizations: Not on file  . Attends Club or Organization Meetings: Not on file  . Marital Status: Not on file  Intimate Partner Violence:   . Fear of Current or Ex-Partner: Not on file  . Emotionally Abused: Not on file  . Physically Abused: Not on file  . Sexually Abused: Not on file    Review of Systems: Review of Systems  Constitutional: Positive for malaise/fatigue. Negative for chills, fever and weight loss.  HENT: Negative for hearing loss and tinnitus.   Eyes: Negative for pain and redness.  Respiratory: Positive for cough and shortness of breath.   Cardiovascular: Negative for chest pain and palpitations.  Gastrointestinal: Positive for blood in stool. Negative for abdominal pain, constipation, diarrhea, heartburn, melena, nausea and vomiting.  Genitourinary: Negative for flank pain and hematuria.  Musculoskeletal: Negative for falls and joint pain.  Skin: Negative for itching   and rash.  Neurological: Negative for seizures and loss of consciousness.  Endo/Heme/Allergies: Negative for polydipsia. Does not bruise/bleed easily.  Psychiatric/Behavioral: Negative for substance abuse. The patient is not nervous/anxious.      Physical Exam: Vital signs: Vitals:   08/05/20 1215 08/05/20 1230  BP: (!) 97/46 (!) 104/43  Pulse: 61 60  Resp: 16 19  Temp: 98.1 F (36.7 C) 98.1 F (36.7 C)  SpO2: 98% 97%     Physical Exam Vitals reviewed.  Constitutional:      General: She is not in acute distress.    Appearance: She is obese.  HENT:     Head: Normocephalic and atraumatic.     Nose: Nose normal.     Mouth/Throat:     Mouth: Mucous membranes are moist.     Pharynx: Oropharynx is clear.  Eyes:     General: No scleral icterus.    Extraocular Movements: Extraocular movements intact.     Comments: Conjunctival pallor  Cardiovascular:     Rate and Rhythm: Normal rate and regular rhythm.      Pulses: Normal pulses.     Heart sounds: Normal heart sounds.  Pulmonary:     Effort: Pulmonary effort is normal.     Breath sounds: Normal breath sounds.  Abdominal:     General: Bowel sounds are normal. There is no distension.     Palpations: Abdomen is soft. There is no mass.     Tenderness: There is no abdominal tenderness. There is no guarding or rebound.     Hernia: A hernia (umbilical) is present.  Musculoskeletal:        General: No swelling or tenderness.     Cervical back: Normal range of motion and neck supple.  Skin:    General: Skin is warm and dry.  Neurological:     General: No focal deficit present.     Mental Status: She is alert and oriented to person, place, and time.  Psychiatric:        Mood and Affect: Mood normal.        Behavior: Behavior normal.      GI:  Lab Results: Recent Labs    08/05/20 0906  WBC 6.0  HGB 6.5*  HCT 22.1*  PLT 305   BMET Recent Labs    08/05/20 0906  NA 134*  K 4.6  CL 102  CO2 19*  GLUCOSE 107*  BUN 26*  CREATININE 1.17*  CALCIUM 9.1   LFT Recent Labs    08/05/20 0906  PROT 6.6  ALBUMIN 3.2*  AST 16  ALT 12  ALKPHOS 50  BILITOT 0.8   PT/INR Recent Labs    08/05/20 0906  LABPROT 26.8*  INR 2.6*     Studies/Results: DG Chest 2 View  Result Date: 08/05/2020 CLINICAL DATA:  Cough and shortness of breath EXAM: CHEST - 2 VIEW COMPARISON:  07/28/2020 FINDINGS: Chronic interstitial prominence. No focal consolidation. No pleural effusion or pneumothorax. Stable cardiomegaly. No acute osseous abnormality IMPRESSION: No acute process in the chest.  Chronic interstitial lung disease. Stable cardiomegaly. Electronically Signed   By: Guadlupe Spanish M.D.   On: 08/05/2020 10:15    Impression: Rectal bleeding and symptomatic anemia: Hemoglobin 6.5 today, decreased from 11.3 on 12.3 as of 06/08/2020. -Small amount of red blood mixed in the stool intermittently, though amount of blood does not seem to correlate  with large drop in hemoglobin.  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 -BUN 26/Cr 1.17, stable as compared to baseline BUN 28/Cr 1.11 as of 06/08/2020 -INR elevated to 2.6  A. fib, on Xarelto, last dose 08/04/2020  CHF, EF 55-60% as of echo 10/2019  History of DVT  Plan: Recommend proceeding with EGD and colonoscopy on Friday 10/22.  I thoroughly discussed the procedure with the patient to include nature, alternatives, benefits, and risks (including but not limited to bleeding, infection, perforation, anesthesia/cardiac and pulmonary complications).  Patient verbalized understanding and gave verbal consent to proceed with EGD and colonoscopy.  Continue Protonix IV.  Continue to hold Xarelto.  Continue to monitor H&H with transfusion as needed to maintain hemoglobin greater than 7-8.  Regular diet OK today since patient does not currently have evidence of active bleeding.  Clear liquid diet tomorrow with plans for colonoscopy prep tomorrow.  We will repeat INR tomorrow as well.  [facility] will follow.   LOS: 0 days   Edrick Kins  PA-C 08/05/2020, 1:02 PM  Contact #  [phone]

## 2020-08-05 NOTE — ED Triage Notes (Signed)
Patient complains of 1 month of increased shortness of breath. Has been seen by cardiology and primary. Seen by pulmonary last week and low sats with ambulation noted at office. Denies pain- reports blood in stool x 1 month. Denies pain, alert and oriented

## 2020-08-05 NOTE — ED Provider Notes (Addendum)
[facility] EMERGENCY DEPARTMENT Provider Note   CSN: [id number] Arrival date & time: 08/05/20  0825     History No chief complaint on file.   Carrie Haynes is a 79 y.o. female.  Pt presents to the ED today with sob.  She has also noticed some blood in her stool.  Pt said sx have been going on for about a month.  She saw her pulmonologist and labs were drawn.  She had a hemoglobin drop from 11.0 on 09/02/19 to 8.2 on 07/28/20.  She was told to f/u with GI and with her pcp.  She called her pcp who told her to come to the hospital.  The pt has seen [facility] for colonoscopies.  The last time 1 was due, she was given a cologuard test which was negative.  She is on Xarelto for a hx of afib.  She denies any fevers.  She has been fully vaccinated against Covid (including booster).        Past Medical History:  Diagnosis Date  . A-fib (HCC)   . Diabetes mellitus without complication (HCC)   . Hyperlipemia   . Hypertension     Patient Active Problem List   Diagnosis Date Noted  . Hypertension   . Hyperlipemia   . Diabetes mellitus without complication (HCC)   . Symptomatic anemia 07/30/2020  . Secondary hypercoagulable state (HCC) 09/16/2019  . Upper airway cough syndrome 07/13/2017  . Morbid obesity due to excess calories (HCC) 07/13/2017  . DCM (dilated cardiomyopathy) (HCC)   . Atrial fibrillation with RVR (HCC)   . Asystole (HCC)   . Hypotension   . Acute on chronic combined systolic and diastolic CHF (congestive heart failure) (HCC)   . DOE (dyspnea on exertion) 05/12/2017  . A-fib (HCC) 05/10/2017    Past Surgical History:  Procedure Laterality Date  . CARDIOVERSION N/A 05/18/2017   Procedure: CARDIOVERSION;  Surgeon: Wendall Stade, MD;  Location: [facility] ENDOSCOPY;  Service: Cardiovascular;  Laterality: N/A;  . CARDIOVERSION N/A 05/23/2017   Procedure: CARDIOVERSION;  Surgeon: Quintella Reichert, MD;  Location: [facility] ENDOSCOPY;  Service: Cardiovascular;   Laterality: N/A;  . CARDIOVERSION N/A 08/28/2019   Procedure: CARDIOVERSION;  Surgeon: Jake Bathe, MD;  Location: [facility] ENDOSCOPY;  Service: Cardiovascular;  Laterality: N/A;  . RIGHT/LEFT HEART CATH AND CORONARY ANGIOGRAPHY N/A 05/12/2017   Procedure: Right/Left Heart Cath and Coronary Angiography;  Surgeon: Lyn Records, MD;  Location: [facility] INVASIVE CV LAB;  Service: Cardiovascular;  Laterality: N/A;  . TEE WITHOUT CARDIOVERSION N/A 05/18/2017   Procedure: TRANSESOPHAGEAL ECHOCARDIOGRAM (TEE);  Surgeon: Wendall Stade, MD;  Location: [facility] ENDOSCOPY;  Service: Cardiovascular;  Laterality: N/A;     OB History   No obstetric history on file.     Family History  Problem Relation Age of Onset  . Hypertension Father   . Heart attack Sister   . Breast cancer Other     Social History   Tobacco Use  . Smoking status: Never Smoker  . Smokeless tobacco: Never Used  Substance Use Topics  . Alcohol use: No  . Drug use: No    Home Medications Prior to Admission medications   Medication Sig Start Date End Date Taking? Authorizing Provider  cetirizine (ZYRTEC) 10 MG tablet Take 10 mg by mouth daily as needed for allergies.   Yes [provider]  Cholecalciferol (VITAMIN D-3) 5000 units TABS Take 5,000 Units by mouth daily.   Yes [provider]  dofetilide (TIKOSYN) 500 MCG capsule TAKE 1 CAPSULE BY MOUTH TWICE A DAY Patient taking differently: Take 500 mcg by mouth 2 (two) times daily.  03/25/20  Yes Croitoru, Mihai, MD  losartan (COZAAR) 50 MG tablet Take 50 mg by mouth daily. 12/09/19  Yes [provider]  Magnesium Oxide 400 MG CAPS Take 1 capsule (400 mg total) by mouth 2 (two) times daily. Patient taking differently: Take 800 mg by mouth daily.  01/21/20  Yes Fenton, Clint R, PA  metFORMIN (GLUCOPHAGE-XR) 500 MG 24 hr tablet Take 1,500 mg by mouth at bedtime.    Yes [provider]  metoprolol succinate (TOPROL-XL) 50 MG 24 hr tablet Take 1 tablet (50 mg  total) by mouth daily. Take with or immediately following a meal. 07/15/20  Yes Croitoru, Mihai, MD  omeprazole (PRILOSEC) 40 MG capsule Take 40 mg by mouth every morning. 12/26/19  Yes [provider]  XARELTO 20 MG TABS tablet TAKE 1 TABLET (20 MG TOTAL) BY MOUTH DAILY WITH SUPPER. Patient taking differently: Take 20 mg by mouth daily with supper.  06/25/20  Yes Croitoru, Mihai, MD  FREESTYLE PRECISION NEO TEST test strip USE AS DIRECTED TO TEST BLOOD SUGAR DAILY E11.21 09/06/19   [provider]    Allergies    Calcium channel blockers  Review of Systems   Review of Systems  Respiratory: Positive for shortness of breath.   Gastrointestinal: Positive for blood in stool.  All other systems reviewed and are negative.   Physical Exam Updated Vital Signs BP (!) 105/48   Pulse 93   Temp 98.9 F (37.2 C) (Oral)   Resp (!) 24   Ht 5' (1.524 m)   Wt 103.9 kg   SpO2 100%   BMI 44.72 kg/m   Physical Exam Vitals and nursing note reviewed.  Constitutional:      Appearance: She is overweight.  HENT:     Head: Normocephalic and atraumatic.     Right Ear: External ear normal.     Left Ear: External ear normal.     Nose: Nose normal.     Mouth/Throat:     Mouth: Mucous membranes are moist.     Pharynx: Oropharynx is clear.  Eyes:     Extraocular Movements: Extraocular movements intact.     Conjunctiva/sclera: Conjunctivae normal.     Pupils: Pupils are equal, round, and reactive to light.  Cardiovascular:     Rate and Rhythm: Normal rate and regular rhythm.     Pulses: Normal pulses.     Heart sounds: Normal heart sounds.  Pulmonary:     Effort: Tachypnea present.  Abdominal:     General: Abdomen is flat. Bowel sounds are normal.     Palpations: Abdomen is soft.  Genitourinary:    Rectum: Guaiac result negative.     Comments: Stool is light brown Musculoskeletal:        General: Normal range of motion.     Cervical back: Normal range of motion and neck  supple.  Skin:    General: Skin is warm.     Capillary Refill: Capillary refill takes less than 2 seconds.  Neurological:     General: No focal deficit present.     Mental Status: She is alert and oriented to person, place, and time.  Psychiatric:        Mood and Affect: Mood normal.        Behavior: Behavior normal.        Thought Content:  Thought content normal.        Judgment: Judgment normal.     ED Results / Procedures / Treatments   Labs (all labs ordered are listed, but only abnormal results are displayed) Labs Reviewed  COMPREHENSIVE METABOLIC PANEL - Abnormal; Notable for the following components:      Result Value   Sodium 134 (*)    CO2 19 (*)    Glucose, Bld 107 (*)    BUN 26 (*)    Creatinine, Ser 1.17 (*)    Albumin 3.2 (*)    GFR, Estimated 45 (*)    All other components within normal limits  CBC WITH DIFFERENTIAL/PLATELET - Abnormal; Notable for the following components:   RBC 2.35 (*)    Hemoglobin 6.5 (*)    HCT 22.1 (*)    MCHC 29.4 (*)    nRBC 0.5 (*)    All other components within normal limits  PROTIME-INR - Abnormal; Notable for the following components:   Prothrombin Time 26.8 (*)    INR 2.6 (*)    All other components within normal limits  RESPIRATORY PANEL BY RT PCR (FLU A&B, COVID)  MAGNESIUM  URINALYSIS, ROUTINE W REFLEX MICROSCOPIC  HEMOGLOBIN A1C  POC OCCULT BLOOD, ED  TYPE AND SCREEN  PREPARE RBC (CROSSMATCH)  ABO/RH    EKG EKG Interpretation  Date/Time:  Wednesday August 05 2020 11:24:47 EDT Ventricular Rate:  60 PR Interval:    QRS Duration: 87 QT Interval:  622 QTC Calculation: 622 R Axis:   -39 Text Interpretation: Sinus rhythm Left axis deviation Low voltage, precordial leads RSR' in V1 or V2, right VCD or RVH Borderline T abnormalities, lateral leads Prolonged QT interval No significant change since last tracing Confirmed by Jacalyn Lefevre [phone]) on 08/05/2020 11:49:53 AM   Radiology DG Chest 2 View  Result  Date: 08/05/2020 CLINICAL DATA:  Cough and shortness of breath EXAM: CHEST - 2 VIEW COMPARISON:  07/28/2020 FINDINGS: Chronic interstitial prominence. No focal consolidation. No pleural effusion or pneumothorax. Stable cardiomegaly. No acute osseous abnormality IMPRESSION: No acute process in the chest.  Chronic interstitial lung disease. Stable cardiomegaly. Electronically Signed   By: Guadlupe Spanish M.D.   On: 08/05/2020 10:15    Procedures Procedures (including critical care time)  Medications Ordered in ED Medications  0.9 %  sodium chloride infusion (has no administration in time range)  pantoprazole (PROTONIX) 80 mg in sodium chloride 0.9 % 100 mL IVPB (has no administration in time range)  pantoprazole (PROTONIX) 80 mg in sodium chloride 0.9 % 100 mL (0.8 mg/mL) infusion (has no administration in time range)  acetaminophen (TYLENOL) tablet 650 mg (has no administration in time range)    Or  acetaminophen (TYLENOL) suppository 650 mg (has no administration in time range)  ondansetron (ZOFRAN) tablet 4 mg (has no administration in time range)    Or  ondansetron (ZOFRAN) injection 4 mg (has no administration in time range)  insulin aspart (novoLOG) injection 0-15 Units (has no administration in time range)  insulin aspart (novoLOG) injection 0-5 Units (has no administration in time range)    ED Course  I have reviewed the triage vital signs and the nursing notes.  Pertinent labs & imaging results that were available during my care of the patient were reviewed by me and considered in my medical decision making (see chart for details).    MDM Rules/Calculators/A&P  CHA2DS2/VAS Stroke Risk Points  Current as of 27 minutes ago     6 >= 2 Points: High Risk  1 - 1.99 Points: Medium Risk  0 Points: Low Risk    No Change      Details    This score determines the patient's risk of having a stroke if the  patient has atrial fibrillation.       Points  Metrics  1 Has Congestive Heart Failure:  Yes    Current as of 27 minutes ago  0 Has Vascular Disease:  No    Current as of 27 minutes ago  1 Has Hypertension:  Yes    Current as of 27 minutes ago  2 Age:  66    Current as of 27 minutes ago  1 Has Diabetes:  Yes    Current as of 27 minutes ago  0 Had Stroke:  No  Had TIA:  No  Had thromboembolism:  No    Current as of 27 minutes ago  1 Female:  Yes    Current as of 27 minutes ago     Covid neg.  CXR neg.  Due to symptomatic anemia, 2 units of blood ordered for transfusion.    Pt d/w Dr. Matthias Hughs [facility]).  He was able to see a recent hgb of 12 from pcp's office from August or September.  He recommended protonix. Hold Xarelto. He will likely do an EGD and colonoscopy.    Pt d/w Dr. Jacqulyn Bath (triad) for admission.  Carrie Haynes was evaluated in Emergency Department on 08/05/2020 for the symptoms described in the history of present illness. She was evaluated in the context of the global COVID-19 pandemic, which necessitated consideration that the patient might be at risk for infection with the SARS-CoV-2 virus that causes COVID-19. Institutional protocols and algorithms that pertain to the evaluation of patients at risk for COVID-19 are in a state of rapid change based on information released by regulatory bodies including the CDC and federal and state organizations. These policies and algorithms were followed during the patient's care in the ED.  CRITICAL CARE Performed by: Jacalyn Lefevre   Total critical care time: 45 minutes  Critical care time was exclusive of separately billable procedures and treating other patients.  Critical care was necessary to treat or prevent imminent or life-threatening deterioration.  Critical care was time spent personally by me on the following activities: development of treatment plan with patient and/or surrogate as well as nursing, discussions with consultants, evaluation of patient's response to  treatment, examination of patient, obtaining history from patient or surrogate, ordering and performing treatments and interventions, ordering and review of laboratory studies, ordering and review of radiographic studies, pulse oximetry and re-evaluation of patient's condition.     Final Clinical Impression(s) / ED Diagnoses Final diagnoses:  SOB (shortness of breath)  Symptomatic anemia  On rivaroxaban therapy  Paroxysmal atrial fibrillation [facility])    Rx / DC Orders ED Discharge Orders    None       Jacalyn Lefevre, MD 08/05/20 1058    Jacalyn Lefevre, MD 08/05/20 1150

## 2020-08-06 DIAGNOSIS — E119 Type 2 diabetes mellitus without complications: Secondary | ICD-10-CM

## 2020-08-06 DIAGNOSIS — I10 Essential (primary) hypertension: Secondary | ICD-10-CM

## 2020-08-06 LAB — TYPE AND SCREEN
ABO/RH(D): A POS
Antibody Screen: NEGATIVE
Unit division: 0
Unit division: 0

## 2020-08-06 LAB — COMPREHENSIVE METABOLIC PANEL
ALT: 11 U/L (ref 0–44)
AST: 18 U/L (ref 15–41)
Albumin: 3.1 g/dL — ABNORMAL LOW (ref 3.5–5.0)
Alkaline Phosphatase: 52 U/L (ref 38–126)
Anion gap: 9 (ref 5–15)
BUN: 23 mg/dL (ref 8–23)
CO2: 21 mmol/L — ABNORMAL LOW (ref 22–32)
Calcium: 8.6 mg/dL — ABNORMAL LOW (ref 8.9–10.3)
Chloride: 105 mmol/L (ref 98–111)
Creatinine, Ser: 1.48 mg/dL — ABNORMAL HIGH (ref 0.44–1.00)
GFR, Estimated: 34 mL/min — ABNORMAL LOW (ref >60)
Glucose, Bld: 135 mg/dL — ABNORMAL HIGH (ref 70–99)
Potassium: 4.9 mmol/L (ref 3.5–5.1)
Sodium: 135 mmol/L (ref 135–145)
Total Bilirubin: 2.7 mg/dL — ABNORMAL HIGH (ref 0.3–1.2)
Total Protein: 6.3 g/dL — ABNORMAL LOW (ref 6.5–8.1)

## 2020-08-06 LAB — CBC
HCT: 27 % — ABNORMAL LOW (ref 36.0–46.0)
Hemoglobin: 8.7 g/dL — ABNORMAL LOW (ref 12.0–15.0)
MCH: 29.4 pg (ref 26.0–34.0)
MCHC: 32.2 g/dL (ref 30.0–36.0)
MCV: 91.2 fL (ref 80.0–100.0)
Platelets: 250 10*3/uL (ref 150–400)
RBC: 2.96 MIL/uL — ABNORMAL LOW (ref 3.87–5.11)
RDW: 15.2 % (ref 11.5–15.5)
WBC: 6 10*3/uL (ref 4.0–10.5)
nRBC: 0.8 % — ABNORMAL HIGH (ref 0.0–0.2)

## 2020-08-06 LAB — GLUCOSE, CAPILLARY
Glucose-Capillary: 110 mg/dL — ABNORMAL HIGH (ref 70–99)
Glucose-Capillary: 116 mg/dL — ABNORMAL HIGH (ref 70–99)
Glucose-Capillary: 94 mg/dL (ref 70–99)
Glucose-Capillary: 94 mg/dL (ref 70–99)
Glucose-Capillary: 90 mg/dL (ref 70–99)

## 2020-08-06 LAB — HEMOGLOBIN A1C
Hgb A1c MFr Bld: 6.6 % — ABNORMAL HIGH (ref 4.8–5.6)
Mean Plasma Glucose: 143 mg/dL

## 2020-08-06 LAB — PROTIME-INR
INR: 1.3 — ABNORMAL HIGH (ref 0.8–1.2)
Prothrombin Time: 15.3 seconds — ABNORMAL HIGH (ref 11.4–15.2)

## 2020-08-06 LAB — HEMOGLOBIN AND HEMATOCRIT, BLOOD
HCT: 27.9 % — ABNORMAL LOW (ref 36.0–46.0)
Hemoglobin: 8.8 g/dL — ABNORMAL LOW (ref 12.0–15.0)

## 2020-08-06 LAB — BPAM RBC
Blood Product Expiration Date: 202111012359
Blood Product Expiration Date: 202111052359
ISSUE DATE / TIME: 202110201200
ISSUE DATE / TIME: 202110201848
Unit Type and Rh: 6200
Unit Type and Rh: 6200

## 2020-08-06 MED ORDER — METOPROLOL SUCCINATE ER 50 MG PO TB24
50.0000 mg | ORAL_TABLET | Freq: Every day | ORAL | Status: DC
  Administered 2020-08-06 – 2020-08-11 (×4): 50 mg via ORAL
  Filled 2020-08-06 (×6): qty 1

## 2020-08-06 MED ORDER — PANTOPRAZOLE SODIUM 40 MG IV SOLR
40.0000 mg | Freq: Two times a day (BID) | INTRAVENOUS | Status: DC
  Administered 2020-08-06 – 2020-08-11 (×10): 40 mg via INTRAVENOUS
  Filled 2020-08-06 (×11): qty 40

## 2020-08-06 MED ORDER — PEG 3350-KCL-NA BICARB-NACL 420 G PO SOLR
4000.0000 mL | Freq: Once | ORAL | Status: AC
  Administered 2020-08-06: 4000 mL via ORAL
  Filled 2020-08-06 (×2): qty 4000

## 2020-08-06 MED ORDER — DOFETILIDE 125 MCG PO CAPS
125.0000 ug | ORAL_CAPSULE | Freq: Two times a day (BID) | ORAL | Status: DC
  Administered 2020-08-06 – 2020-08-07 (×3): 125 ug via ORAL
  Filled 2020-08-06 (×3): qty 1

## 2020-08-06 MED ORDER — GUAIFENESIN-DM 100-10 MG/5ML PO SYRP
5.0000 mL | ORAL_SOLUTION | ORAL | Status: DC | PRN
  Administered 2020-08-06 – 2020-08-08 (×3): 5 mL via ORAL
  Filled 2020-08-06 (×3): qty 5

## 2020-08-06 NOTE — Progress Notes (Signed)
PROGRESS NOTE    Carrie Haynes  NWG:[id number] DOB: January 24, 1941 DOA: 08/05/2020 PCP: Laurann Montana, MD   No chief complaint on file.   Brief Narrative:  79 year old lady prior history of hypertension, type 2 diabetes, paroxysmal atrial fibrillation on Xarelto s/p DCCV last year, chronic combined CHF, DVT, morbid obesity, GERD presents to ED with exertional chest shortness of breath since 1 month she was found to have a hemoglobin of 6.  Patient also reports that she has noticed bright red blood per rectum mixed with stools on and off since 1 1.  She was admitted for further evaluation to [facility].  Gastroenterology consulted and she is scheduled for EGD and colonoscopy tomorrow.  She underwent 2 units of PRBC transfusion and was started on IV PPI.  Her repeat hemoglobin has improved to 0.7.  Assessment & Plan:   Principal Problem:   Symptomatic anemia Active Problems:   A-fib (HCC)   Morbid obesity due to excess calories (HCC)   Hypertension   Hyperlipemia   Diabetes mellitus without complication (HCC)   Exertional shortness of breath probably secondary to combination of symptomatic anemia and chronic systolic heart failure.  Patient's baseline hemoglobin around 11, dropped to 6.5 on admission.  S/p 2 units of PRBC transfusion repeat hemoglobin around 8.7. Patient did not have any more bloody bowel movements at this time. Continue with PPI IV, transfuse to keep hemoglobin greater than 7. Patient on room air with good oxygen sats. GI consulted and plan for EGD and colonoscopy tomorrow. Anemia panel will be ordered for further evaluation. POC occult blood is negative.,  Continue to hold Xarelto.  Avoid NSAID.    Mild AKI Probably secondary to blood loss anemia Continue to monitor repeat BMP tomorrow.   Essential hypertension Blood pressure parameters are borderline to normal.   Paroxysmal atrial fibrillation Rate controlled S/p DCCV. Restart dofetilide and  metoprolol.    Prolonged QTC Repeat EKG tonight.   Body mass index is 45.47 kg/m. Diet modification/exercise and weight loss recommended.    Shortness of breath/cough  Chest x-ray is negative for pulmonary edema or or any pneumonia.     DVT prophylaxis: SCDs Code Status: Full code Family Communication: (Family at bedside) Disposition:   Status is: Inpatient  Remains inpatient appropriate because:Ongoing diagnostic testing needed not appropriate for outpatient work up   Dispo: The patient is from: Home              Anticipated d/c is to: Home              Anticipated d/c date is: 2 days              Patient currently is not medically stable to d/c.       Consultants:   Gastroenterology  Procedures: None Antimicrobials: None  Subjective: No new complaints  Objective: Vitals:   08/06/20 0343 08/06/20 0400 08/06/20 0558 08/06/20 0729  BP: (!) 134/49   121/62  Pulse: 81 73  73  Resp: (!) 27 18  18   Temp: 97.8 F (36.6 C)   98.6 F (37 C)  TempSrc: Oral   Oral  SpO2: 92% 97%  98%  Weight:   105.6 kg   Height:        Intake/Output Summary (Last 24 hours) at 08/06/2020 1521 Last data filed at 08/06/2020 0558 Gross per 24 hour  Intake 2778.06 ml  Output 900 ml  Net 1878.06 ml   Filed Weights   08/05/20 0855 08/05/20 2345 08/06/20  4010  Weight: 103.9 kg 106.8 kg 105.6 kg    Examination:  General exam: Appears calm and comfortable  Respiratory system: Clear to auscultation. Respiratory effort normal. Cardiovascular system: S1 & S2 heard, irregularly irregular. No JVD,. No pedal edema. Gastrointestinal system: Abdomen is nondistended, soft and nontender.  Normal bowel sounds heard. Central nervous system: Alert and oriented. No focal neurological deficits. Extremities: Symmetric 5 x 5 power. Skin: No rashes, lesions or ulcers Psychiatry:Mood & affect appropriate.     Data Reviewed: I have personally reviewed following labs and imaging  studies  CBC: Recent Labs  Lab 08/05/20 0906 08/06/20 0147  WBC 6.0 6.0  NEUTROABS 4.1  --   HGB 6.5* 8.7*  HCT 22.1* 27.0*  MCV 94.0 91.2  PLT 305 250    Basic Metabolic Panel: Recent Labs  Lab 08/05/20 0906 08/05/20 1050 08/06/20 0147  NA 134*  --  135  K 4.6  --  4.9  CL 102  --  105  CO2 19*  --  21*  GLUCOSE 107*  --  135*  BUN 26*  --  23  CREATININE 1.17*  --  1.48*  CALCIUM 9.1  --  8.6*  MG  --  1.7  --     GFR: Estimated Creatinine Clearance: 35.5 mL/min (A) (by C-G formula based on SCr of 1.48 mg/dL (H)).  Liver Function Tests: Recent Labs  Lab 08/05/20 0906 08/06/20 0147  AST 16 18  ALT 12 11  ALKPHOS 50 52  BILITOT 0.8 2.7*  PROT 6.6 6.3*  ALBUMIN 3.2* 3.1*    CBG: Recent Labs  Lab 08/05/20 1438 08/05/20 1925 08/05/20 2215 08/06/20 0604 08/06/20 1052  GLUCAP 99 137* 110* 116* 94     Recent Results (from the past 240 hour(s))  Respiratory Panel by RT PCR (Flu A&B, Covid) - Nasopharyngeal Swab     Status: None   Collection Time: 08/05/20  9:06 AM   Specimen: Nasopharyngeal Swab  Result Value Ref Range Status   SARS Coronavirus 2 by RT PCR NEGATIVE NEGATIVE Final    Comment: (NOTE) SARS-CoV-2 target nucleic acids are NOT DETECTED.  The SARS-CoV-2 RNA is generally detectable in upper respiratoy specimens during the acute phase of infection. The lowest concentration of SARS-CoV-2 viral copies this assay can detect is 131 copies/mL. A negative result does not preclude SARS-Cov-2 infection and should not be used as the sole basis for treatment or other patient management decisions. A negative result may occur with  improper specimen collection/handling, submission of specimen other than nasopharyngeal swab, presence of viral mutation(s) within the areas targeted by this assay, and inadequate number of viral copies (<131 copies/mL). A negative result must be combined with clinical observations, patient history, and epidemiological  information. The expected result is Negative.  Fact Sheet for Patients:  https://www.moore.com/  Fact Sheet for Healthcare Providers:  https://www.young.biz/  This test is no t yet approved or cleared by the [place] FDA and  has been authorized for detection and/or diagnosis of SARS-CoV-2 by FDA under an Emergency Use Authorization (EUA). This EUA will remain  in effect (meaning this test can be used) for the duration of the COVID-19 declaration under Section 564(b)(1) of the Act, 21 U.S.C. section 360bbb-3(b)(1), unless the authorization is terminated or revoked sooner.     Influenza A by PCR NEGATIVE NEGATIVE Final   Influenza B by PCR NEGATIVE NEGATIVE Final    Comment: (NOTE) The Xpert Xpress SARS-CoV-2/FLU/RSV assay is intended as an aid in  the diagnosis of influenza from Nasopharyngeal swab specimens and  should not be used as a sole basis for treatment. Nasal washings and  aspirates are unacceptable for Xpert Xpress SARS-CoV-2/FLU/RSV  testing.  Fact Sheet for Patients: https://www.moore.com/  Fact Sheet for Healthcare Providers: https://www.young.biz/  This test is not yet approved or cleared by the [place] FDA and  has been authorized for detection and/or diagnosis of SARS-CoV-2 by  FDA under an Emergency Use Authorization (EUA). This EUA will remain  in effect (meaning this test can be used) for the duration of the  Covid-19 declaration under Section 564(b)(1) of the Act, 21  U.S.C. section 360bbb-3(b)(1), unless the authorization is  terminated or revoked. Performed at [facility], [address], [place], [place] [address]          Radiology Studies: DG Chest 2 View  Result Date: 08/05/2020 CLINICAL DATA:  Cough and shortness of breath EXAM: CHEST - 2 VIEW COMPARISON:  07/28/2020 FINDINGS: Chronic interstitial prominence. No focal consolidation. No pleural  effusion or pneumothorax. Stable cardiomegaly. No acute osseous abnormality IMPRESSION: No acute process in the chest.  Chronic interstitial lung disease. Stable cardiomegaly. Electronically Signed   By: Guadlupe Spanish M.D.   On: 08/05/2020 10:15        Scheduled Meds: . dofetilide  125 mcg Oral BID  . insulin aspart  0-15 Units Subcutaneous TID WC  . insulin aspart  0-5 Units Subcutaneous QHS  . metoprolol succinate  50 mg Oral Daily  . pantoprazole (PROTONIX) IV  40 mg Intravenous Q12H   Continuous Infusions: . sodium chloride 125 mL/hr at 08/06/20 0410     LOS: 1 day       Kathlen Mody, MD [facility]   To contact the attending provider between 7A-7P or the covering provider during after hours 7P-7A, please log into the web site [email or web address] and access using universal [place] password for that web site. If you do not have the password, please call the hospital operator.  08/06/2020, 3:21 PM

## 2020-08-06 NOTE — TOC Benefit Eligibility Note (Signed)
Transition of Care [facility]) Benefit Eligibility Note    Patient Details  Name: Carrie Haynes MRN: [id number] Date of Birth: 1941-06-15   Medication/Dose: DOFETILIDE 159mg. bid 30 day supply ((TIKOSYN Not  Covered)  Covered?: Yes  Tier:  (4)  Prescription Coverage [facility]erred [facility]: [facility]  Spoke with Person/Company/Phone Number:: RSherren Kerns [employer]  PH# [phone] Co-Pay: $58.41  Prior Approval: No  Deductible: Met       HShelda AltesPhone Number: 08/06/2020, 12:32 PM

## 2020-08-06 NOTE — Progress Notes (Signed)
[facility]astroenterology Progress Note  Carrie Haynes 79 y.o. 1941/04/29  CC: Iron deficiency anemia  Subjective: Patient reports feeling better this morning.  States her shortness of breath is improving, though she still has dyspnea on exertion.  Denies any abdominal pain, nausea, vomiting.  States she had a bowel movement this morning without any melena or hematochezia.  ROS : Review of Systems  Respiratory: Positive for shortness of breath. Negative for cough.   Cardiovascular: Negative for chest pain and palpitations.  Gastrointestinal: Negative for abdominal pain, blood in stool, constipation, diarrhea, heartburn, melena, nausea and vomiting.   Objective: Vital signs in last 24 hours: Vitals:   08/06/20 0400 08/06/20 0729  BP:  121/62  Pulse: 73 73  Resp: 18 18  Temp:  98.6 F (37 C)  SpO2: 97% 98%    Physical Exam:  General:  Alert, oriented, cooperative, no distress, appears stated age  Head:  Normocephalic, without obvious abnormality, atraumatic  Eyes:  Anicteric sclera, EOMs intact  Lungs:   Clear to auscultation bilaterally, respirations unlabored  Heart:  Regular rate and rhythm, S1, S2 normal  Abdomen:   Soft, non-tender, non-distended, bowel sounds active all four quadrants,  no guarding or peritoneal signs.   Extremities: Extremities normal, atraumatic, no  edema  Pulses: 2+ and symmetric    Lab Results: Recent Labs    08/05/20 0906 08/05/20 1050 08/06/20 0147  NA 134*  --  135  K 4.6  --  4.9  CL 102  --  105  CO2 19*  --  21*  GLUCOSE 107*  --  135*  BUN 26*  --  23  CREATININE 1.17*  --  1.48*  CALCIUM 9.1  --  8.6*  MG  --  1.7  --    Recent Labs    08/05/20 0906 08/06/20 0147  AST 16 18  ALT 12 11  ALKPHOS 50 52  BILITOT 0.8 2.7*  PROT 6.6 6.3*  ALBUMIN 3.2* 3.1*   Recent Labs    08/05/20 0906 08/06/20 0147  WBC 6.0 6.0  NEUTROABS 4.1  --   HGB 6.5* 8.7*  HCT 22.1* 27.0*  MCV 94.0 91.2  PLT 305 250   Recent Labs     08/05/20 0906 08/06/20 0147  LABPROT 26.8* 15.3*  INR 2.6* 1.3*    Assessment: Symptomatic anemia:  -Hgb 8.7 today, improved from hemoglobin 6.5 yesterday s/p 2u pRBCs -INR now within normal limits (1.3 today)  A. fib, on Xarelto, last dose 08/04/2020  CHF, EF 55-60% as of echo 10/2019  History of DVT  Plan: EGD and colonoscopy tomorrow.  Clear liquid diet and Nulytely prep today.  N.p.o. after midnight.  Protonix 40 mg IV BID.  Continue to hold Xarelto.  Continue to monitor H&H with transfusion as needed to maintain hemoglobin greater than 7-8.  [facility] will follow.  Edrick Kins PA-C 08/06/2020, 10:26 AM  Contact #  [phone]

## 2020-08-06 NOTE — Progress Notes (Signed)
Pharmacy Review for Dofetilide (Tikosyn) Resumption  Admit Complaint: 79 y.o. female admitted 08/05/2020 with atrial fibrillation to be continued on dofetilide.   Assessment: 79 yo W on dofetilide PTA with AKI. Patient missed 2 doses on admission. EKG shows afib. PTA rivaroxaban held for GIB w/u. K is > 4, Mg ordered for tomorrow. Asked MD to enter dofetilide order set for safety and monitoring parameters.   Goal of Therapy: Follow renal function, electrolytes, potential drug interactions, and dose adjustment.    Plan:  Restart decreased dose to 125 mcg BID given AKI  F/u renal function and adjust dose as able Keep K >4, Mg > 2    Monitor EKG and hand calculate QTc given afib    Alphia Moh, PharmD, BCPS, BCCP Clinical Pharmacist  Please check AMION for all [facility] Pharmacy phone numbers After 10:00 PM, call Main Pharmacy [phone]

## 2020-08-07 ENCOUNTER — Inpatient Hospital Stay (HOSPITAL_COMMUNITY): Payer: Medicare Other | Admitting: Certified Registered Nurse Anesthetist

## 2020-08-07 ENCOUNTER — Encounter (HOSPITAL_COMMUNITY): Payer: Self-pay | Admitting: Internal Medicine

## 2020-08-07 ENCOUNTER — Encounter (HOSPITAL_COMMUNITY)
Admission: EM | Disposition: A | Discharge status: Home / Self Care | Payer: Self-pay | Source: Home / Self Care | Attending: Internal Medicine

## 2020-08-07 HISTORY — PX: ESOPHAGOGASTRODUODENOSCOPY: SHX5428

## 2020-08-07 HISTORY — PX: COLONOSCOPY: SHX5424

## 2020-08-07 LAB — BASIC METABOLIC PANEL
Anion gap: 10 (ref 5–15)
BUN: 12 mg/dL (ref 8–23)
CO2: 21 mmol/L — ABNORMAL LOW (ref 22–32)
Calcium: 8.6 mg/dL — ABNORMAL LOW (ref 8.9–10.3)
Chloride: 107 mmol/L (ref 98–111)
Creatinine, Ser: 1.06 mg/dL — ABNORMAL HIGH (ref 0.44–1.00)
GFR, Estimated: 54 mL/min — ABNORMAL LOW (ref >60)
Glucose, Bld: 120 mg/dL — ABNORMAL HIGH (ref 70–99)
Potassium: 4.1 mmol/L (ref 3.5–5.1)
Sodium: 138 mmol/L (ref 135–145)

## 2020-08-07 LAB — GLUCOSE, CAPILLARY
Glucose-Capillary: 109 mg/dL — ABNORMAL HIGH (ref 70–99)
Glucose-Capillary: 125 mg/dL — ABNORMAL HIGH (ref 70–99)
Glucose-Capillary: 153 mg/dL — ABNORMAL HIGH (ref 70–99)
Glucose-Capillary: 174 mg/dL — ABNORMAL HIGH (ref 70–99)
Glucose-Capillary: 115 mg/dL — ABNORMAL HIGH (ref 70–99)

## 2020-08-07 LAB — MAGNESIUM: Magnesium: 1.5 mg/dL — ABNORMAL LOW (ref 1.7–2.4)

## 2020-08-07 LAB — HEMOGLOBIN AND HEMATOCRIT, BLOOD
HCT: 25.2 % — ABNORMAL LOW (ref 36.0–46.0)
HCT: 26.7 % — ABNORMAL LOW (ref 36.0–46.0)
Hemoglobin: 8 g/dL — ABNORMAL LOW (ref 12.0–15.0)
Hemoglobin: 8.4 g/dL — ABNORMAL LOW (ref 12.0–15.0)

## 2020-08-07 SURGERY — EGD (ESOPHAGOGASTRODUODENOSCOPY)
Anesthesia: Monitor Anesthesia Care

## 2020-08-07 MED ORDER — SODIUM CHLORIDE 0.9 % IV SOLN: INTRAVENOUS | Status: DC

## 2020-08-07 MED ORDER — PHENYLEPHRINE 40 MCG/ML (10ML) SYRINGE FOR IV PUSH (FOR BLOOD PRESSURE SUPPORT)
PREFILLED_SYRINGE | INTRAVENOUS | Status: DC | PRN
  Administered 2020-08-07: 80 ug via INTRAVENOUS
  Administered 2020-08-07: 120 ug via INTRAVENOUS

## 2020-08-07 MED ORDER — PROPOFOL 10 MG/ML IV BOLUS
INTRAVENOUS | Status: DC | PRN
  Administered 2020-08-07: 10 mg via INTRAVENOUS
  Administered 2020-08-07: 20 mg via INTRAVENOUS

## 2020-08-07 MED ORDER — MAGNESIUM SULFATE 2 GM/50ML IV SOLN
2.0000 g | Freq: Once | INTRAVENOUS | Status: DC
  Filled 2020-08-07: qty 50

## 2020-08-07 MED ORDER — PROPOFOL 500 MG/50ML IV EMUL
INTRAVENOUS | Status: DC | PRN
  Administered 2020-08-07: 125 ug/kg/min via INTRAVENOUS

## 2020-08-07 MED ORDER — DOFETILIDE 500 MCG PO CAPS
500.0000 ug | ORAL_CAPSULE | Freq: Two times a day (BID) | ORAL | Status: DC
  Administered 2020-08-07 – 2020-08-11 (×8): 500 ug via ORAL
  Filled 2020-08-07 (×9): qty 1

## 2020-08-07 MED ORDER — ALBUTEROL SULFATE (2.5 MG/3ML) 0.083% IN NEBU
2.5000 mg | INHALATION_SOLUTION | Freq: Once | RESPIRATORY_TRACT | Status: AC
  Administered 2020-08-07: 2.5 mg via RESPIRATORY_TRACT

## 2020-08-07 MED ORDER — LIDOCAINE 2% (20 MG/ML) 5 ML SYRINGE
INTRAMUSCULAR | Status: DC | PRN
  Administered 2020-08-07: 40 mg via INTRAVENOUS

## 2020-08-07 MED ORDER — ALBUTEROL SULFATE (2.5 MG/3ML) 0.083% IN NEBU
INHALATION_SOLUTION | RESPIRATORY_TRACT | Status: AC
  Filled 2020-08-07: qty 3

## 2020-08-07 MED ORDER — MAGNESIUM SULFATE 4 GM/100ML IV SOLN
4.0000 g | Freq: Once | INTRAVENOUS | Status: AC
  Administered 2020-08-07: 4 g via INTRAVENOUS
  Filled 2020-08-07: qty 100

## 2020-08-07 NOTE — Op Note (Signed)
[facility] Patient Name: Carrie Haynes Procedure Date : 08/07/2020 MRN: [id number] Attending MD: Willis Modena , MD Date of Birth: [birth year] CSN: [id number] Age: 79 Admit Type: Inpatient Procedure:                Upper GI endoscopy Indications:              Iron deficiency anemia secondary to chronic blood                            loss, Hematochezia Providers:                Willis Modena, MD, Glory Rosebush, RN, Charlett Lango, RN, Lawson Radar, Technician Referring MD:              Medicines:                Monitored Anesthesia Care Complications:            No immediate complications. Estimated Blood Loss:     Estimated blood loss: none. Procedure:                Pre-Anesthesia Assessment:                           - Prior to the procedure, a History and Physical                            was performed, and patient medications and                            allergies were reviewed. The patient's tolerance of                            previous anesthesia was also reviewed. The risks                            and benefits of the procedure and the sedation                            options and risks were discussed with the patient.                            All questions were answered, and informed consent                            was obtained. Prior Anticoagulants: The patient has                            taken Xarelto (rivaroxaban), last dose was 3 days                            prior to procedure. ASA Grade Assessment: III - A  patient with severe systemic disease. After                            reviewing the risks and benefits, the patient was                            deemed in satisfactory condition to undergo the                            procedure.                           After obtaining informed consent, the endoscope was                            passed under direct vision. Throughout  the                            procedure, the patient's blood pressure, pulse, and                            oxygen saturations were monitored continuously. The                            GIF-H190 ([id number]) Olympus gastroscope was                            introduced through the mouth, and advanced to the                            third part of duodenum. The upper GI endoscopy was                            accomplished without difficulty. The patient                            tolerated the procedure well. Scope In: Scope Out: Findings:      The examined esophagus was normal.      The entire examined stomach was normal.      Patchy mild inflammation was found in the duodenal bulb.      The exam of the duodenum was otherwise normal.      No old or fresh blood was seen to the extent of our examination. Impression:               - Normal esophagus.                           - Normal stomach.                           - Duodenitis.                           - No specimens collected. Moderate Sedation:      None Recommendation:           - Perform  a colonoscopy today. Procedure Code(s):        --- Professional ---                           [phone], Esophagogastroduodenoscopy, flexible,                            transoral; diagnostic, including collection of                            specimen(s) by brushing or washing, when performed                            (separate procedure) Diagnosis Code(s):        --- Professional ---                           K29.80, Duodenitis without bleeding                           D50.0, Iron deficiency anemia secondary to blood                            loss (chronic)                           K92.1, Melena (includes Hematochezia) CPT copyright 2019 American Medical Association. All rights reserved. The codes documented in this report are preliminary and upon coder review may  be revised to meet current compliance requirements. Willis Modena,  MD 08/07/2020 12:13:30 PM This report has been signed electronically. Number of Addenda: 0

## 2020-08-07 NOTE — Progress Notes (Signed)
After breathing treatment, pt still with some coughing O2 sat on RA 88%. Called Dr. Jean Rosenthal and sat patient up placed on 2L.  1250-coughing slowed and pt feeling better. 02 sat 100%.  1255- Dr. Jean Rosenthal by to see patient. She stated her lungs sounded good that wheezing had stopped. Plan to send patient back to floor.  Report given to [facility]

## 2020-08-07 NOTE — Transfer of Care (Signed)
Immediate Anesthesia Transfer of Care Note  Patient: Carrie Haynes  Procedure(s) Performed: ESOPHAGOGASTRODUODENOSCOPY (EGD) (N/A ) COLONOSCOPY (N/A )  Patient Location: PACU  Anesthesia Type:MAC  Level of Consciousness: drowsy and patient cooperative  Airway & Oxygen Therapy: Patient Spontanous Breathing and Patient connected to face mask oxygen  Post-op Assessment: Report given to RN and Post -op Vital signs reviewed and stable  Post vital signs: Reviewed and stable  Last Vitals:  Vitals Value Taken Time  BP 112/72   Temp    Pulse 66 08/07/20 1213  Resp 14   SpO2 100 % 08/07/20 1213  Vitals shown include unvalidated device data.  Last Pain:  Vitals:   08/07/20 1059  TempSrc: Oral  PainSc: 1          Complications: No complications documented.

## 2020-08-07 NOTE — Anesthesia Postprocedure Evaluation (Signed)
Anesthesia Post Note  Patient: Carrie Haynes  Procedure(s) Performed: ESOPHAGOGASTRODUODENOSCOPY (EGD) (N/A ) COLONOSCOPY (N/A )     Patient location during evaluation: Endoscopy Anesthesia Type: MAC Level of consciousness: awake and alert, patient cooperative and oriented Pain management: pain level controlled Vital Signs Assessment: post-procedure vital signs reviewed and stable Respiratory status: spontaneous breathing, respiratory function stable, nonlabored ventilation and patient connected to nasal cannula oxygen (pt coughing s/p EGD, lungs clear to auscultation, VSS) Cardiovascular status: blood pressure returned to baseline and stable Postop Assessment: no apparent nausea or vomiting Anesthetic complications: no   No complications documented.  Last Vitals:  Vitals:   08/07/20 1245 08/07/20 1255  BP: (!) 154/102 140/70  Pulse: 81 67  Resp: (!) 26 (!) 31  Temp:    SpO2: 96% 100%    Last Pain:  Vitals:   08/07/20 1255  TempSrc:   PainSc: 0-No pain                 Ariz Terrones,E. Rondia Higginbotham

## 2020-08-07 NOTE — Anesthesia Preprocedure Evaluation (Signed)
Anesthesia Evaluation  Patient identified by MRN, date of birth, ID band Patient awake    Reviewed: Allergy & Precautions, NPO status , Patient's Chart, lab work & pertinent test results  History of Anesthesia Complications Negative for: history of anesthetic complications  Airway Mallampati: II  TM Distance: >3 FB Neck ROM: Full    Dental  (+) Missing, Dental Advisory Given   Pulmonary neg pulmonary ROS,    breath sounds clear to auscultation       Cardiovascular hypertension, Pt. on medications and Pt. on home beta blockers (-) angina+ dysrhythmias Atrial Fibrillation  Rhythm:Irregular Rate:Normal     Neuro/Psych negative neurological ROS     GI/Hepatic Neg liver ROS, GERD  Medicated and Controlled,  Endo/Other  diabetes (glu 109), Oral Hypoglycemic AgentsMorbid obesity  Renal/GU negative Renal ROS     Musculoskeletal   Abdominal (+) + obese,   Peds  Hematology  (+) Blood dyscrasia (Hb 8.0), , xarelto   Anesthesia Other Findings   Reproductive/Obstetrics                             Anesthesia Physical Anesthesia Plan  ASA: III  Anesthesia Plan: MAC   Post-op Pain Management:    Induction:   PONV Risk Score and Plan: 2 and Treatment may vary due to age or medical condition  Airway Management Planned: Natural Airway and Simple Face Mask  Additional Equipment: None  Intra-op Plan:   Post-operative Plan:   Informed Consent: I have reviewed the patients History and Physical, chart, labs and discussed the procedure including the risks, benefits and alternatives for the proposed anesthesia with the patient or authorized representative who has indicated his/her understanding and acceptance.     Dental advisory given  Plan Discussed with: CRNA and Surgeon  Anesthesia Plan Comments:         Anesthesia Quick Evaluation

## 2020-08-07 NOTE — Progress Notes (Addendum)
Pharmacy: Dofetilide (Tikosyn) - Follow Up Assessment and Electrolyte Replacement  Pharmacy consulted to assist in monitoring and replacing electrolytes in this 79 y.o. female admitted on 08/05/2020 undergoing dofetilide re-initiation. First dofetilide dose: 125 mcg (reduced for AKI) > renal function improved 10/22, increase back to 500 mcg home dose   Labs:    Component Value Date/Time   K 4.1 08/07/2020 0632   MG 1.5 (L) 08/07/2020 2703     Plan: Potassium: K >/= 4: No additional supplementation needed  Magnesium: Mg 1.3-1.7: Give Mg 4 gm IV x1  Thank you for allowing pharmacy to participate in this patient's care    Alphia Moh, PharmD, BCPS, BCCP Clinical Pharmacist  Please check AMION for all [facility] Pharmacy phone numbers After 10:00 PM, call Main Pharmacy [phone]

## 2020-08-07 NOTE — Progress Notes (Signed)
Pt going for scan. Oxygen was removed for trial to determine necessity and pt tolerated well. Nasal cannula was removed and O2 saturation was at 95%.   -Estella Husk, RN

## 2020-08-07 NOTE — Op Note (Signed)
[facility] Patient Name: Carrie Haynes Procedure Date : 08/07/2020 MRN: [id number] Attending MD: Willis Modena , MD Date of Birth: 11-24-1940 CSN: [id number] Age: 79 Admit Type: Inpatient Procedure:                Colonoscopy Indications:              Last colonoscopy: 2013, Hematochezia, Acute post                            hemorrhagic anemia, Personal history of colonic                            polyps Providers:                Willis Modena, MD, Glory Rosebush, RN, Charlett Lango, RN, Lawson Radar, Technician Referring MD:              Medicines:                Monitored Anesthesia Care Complications:            No immediate complications. Estimated Blood Loss:     Estimated blood loss: none. Procedure:                Pre-Anesthesia Assessment:                           - Prior to the procedure, a History and Physical                            was performed, and patient medications and                            allergies were reviewed. The patient's tolerance of                            previous anesthesia was also reviewed. The risks                            and benefits of the procedure and the sedation                            options and risks were discussed with the patient.                            All questions were answered, and informed consent                            was obtained. Prior Anticoagulants: The patient has                            taken Xarelto (rivaroxaban), last dose was 3 days                            prior to  procedure. ASA Grade Assessment: III - A                            patient with severe systemic disease. After                            reviewing the risks and benefits, the patient was                            deemed in satisfactory condition to undergo the                            procedure.                           After obtaining informed consent, the colonoscope                             was passed under direct vision. Throughout the                            procedure, the patient's blood pressure, pulse, and                            oxygen saturations were monitored continuously. The                            [id number]-[id number] ([id number] Olympus pediatric colonoscope                            was introduced through the anus and advanced to the                            the cecum, identified by appendiceal orifice and                            ileocecal valve. The ileocecal valve, appendiceal                            orifice, and rectum were photographed. The entire                            colon was examined. The colonoscopy was performed                            without difficulty. The patient tolerated the                            procedure well. The quality of the bowel                            preparation was good. Scope In: 11:51:50 AM Scope Out: 12:07:20 PM Scope Withdrawal Time: 0 hours 11 minutes 4 seconds  Total Procedure Duration: 0 hours 15 minutes 30 seconds  Findings:  Hemorrhoids were found on perianal exam.      Internal hemorrhoids were found during retroflexion. The hemorrhoids       were mild.      No additional abnormalities were found on retroflexion.      Colon otherwise normal; no other polyps, masses, vascular ectasias, or       inflammatory changes were seen.      No old or fresh blood was seen to the extent of our examination. Impression:               - Hemorrhoids found on perianal exam.                           - Internal hemorrhoids.                           - The examination was otherwise normal.                           - Suspect mild bleeding from hemorrhoids, but no                            clear explanation for anemia and dark stools seen                            on today's endoscopy/colonoscopy. Recommendation:           - Patient has a contact number available for                             emergencies. The signs and symptoms of potential                            delayed complications were discussed with the                            patient. Return to normal activities tomorrow.                            Written discharge instructions were provided to the                            patient.                           - Return patient to hospital ward for ongoing care.                           - Soft diet today.                           - Continue present medications.                           - Continue to hold Xarelto.                           - If Hgb  continues downtrend, consider capsule                            endoscopy this admission.                           - [facility] will follow. Procedure Code(s):        --- Professional ---                           [phone], Colonoscopy, flexible; diagnostic, including                            collection of specimen(s) by brushing or washing,                            when performed (separate procedure) Diagnosis Code(s):        --- Professional ---                           K64.8, Other hemorrhoids                           K92.1, Melena (includes Hematochezia)                           D62, Acute posthemorrhagic anemia                           Z86.010, Personal history of colonic polyps CPT copyright 2019 [employer]. All rights reserved. The codes documented in this report are preliminary and upon coder review may  be revised to meet current compliance requirements. Willis Modena, MD 08/07/2020 12:20:10 PM This report has been signed electronically. Number of Addenda: 0

## 2020-08-07 NOTE — Progress Notes (Signed)
Patient waking up coughing some. Pt states no distress. O2 sat 100%. Pt wheezing expiratory. Talked with Dr. Jean Rosenthal. Made aware of above. Order for Albuterol treatment. Sam CRNA also in to see patient.

## 2020-08-07 NOTE — Progress Notes (Signed)
PROGRESS NOTE    Carrie Haynes  DTH:[id number] DOB: 27-Sep-1941 DOA: 08/05/2020 PCP: Laurann Montana, MD   No chief complaint on file.   Brief Narrative:  79 year old lady prior history of hypertension, type 2 diabetes, paroxysmal atrial fibrillation on Xarelto s/p DCCV last year, chronic combined CHF, DVT, morbid obesity, GERD presents to ED with exertional chest shortness of breath since 1 month she was found to have a hemoglobin of 6.  Patient also reports that she has noticed bright red blood per rectum mixed with stools on and off since 1 1.  She was admitted for further evaluation to [facility].  Gastroenterology consulted and she is scheduled for EGD and colonoscopy tomorrow.  She underwent 2 units of PRBC transfusion and was started on IV PPI.  Her repeat hemoglobin has improved to 8  Assessment & Plan:   Principal Problem:   Symptomatic anemia Active Problems:   A-fib (HCC)   Morbid obesity due to excess calories (HCC)   Hypertension   Hyperlipemia   Diabetes mellitus without complication (HCC)   Exertional shortness of breath probably secondary to combination of symptomatic anemia and chronic systolic heart failure.  Patient's baseline hemoglobin around 11, dropped to 6.5 on admission.  S/p 2 units of PRBC transfusion repeat hemoglobin around 8.7, has been stable around 8.  Patient did not have any more bloody bowel movements at this time. Continue with PPI IV, transfuse to keep hemoglobin greater than 7. Patient on room air with good oxygen sats. GI consulted and underwent EGD which was wnl. Colonoscopy showed internal hemorrhroids. Plan to monitor hemoglobin and if trending down , will need capsule endoscopy as per GI.  Anemia panel will be ordered for further evaluation. POC occult blood is negative.,  Continue to hold Xarelto.  Avoid NSAID's    Mild AKI Probably secondary to blood loss anemia Creatinine is 1.06 and improved.    Hypomagnesemia:  Replaced.  Repeat  in am.  Keep mag >2.    Essential hypertension Blood pressure parameters are borderline to normal.   Paroxysmal atrial fibrillation Rate controlled S/p DCCV. Restarted dofetilide and metoprolol.    Prolonged QTC Repeat EKG show improvement in QTC.    Body mass index is 45.27 kg/m. Diet modification/exercise and weight loss recommended.    Shortness of breath/cough Chest x-ray is negative for pulmonary edema or or any pneumonia.     DVT prophylaxis: SCDs Code Status: Full code Family Communication: (Family at bedside) Disposition:   Status is: Inpatient  Remains inpatient appropriate because:Ongoing diagnostic testing needed not appropriate for outpatient work up   Dispo: The patient is from: Home              Anticipated d/c is to: Home              Anticipated d/c date is: 2 days              Patient currently is not medically stable to d/c.       Consultants:   Gastroenterology  Procedures: None Antimicrobials: None  Subjective: No nausea, vomiting or abdominal pain.   Objective: Vitals:   08/07/20 1245 08/07/20 1255 08/07/20 1341 08/07/20 1559  BP: (!) 154/102 140/70 114/68 (!) 91/50  Pulse: 81 67 60 74  Resp: (!) 26 (!) 31 18 19   Temp:   97.8 F (36.6 C) 97.7 F (36.5 C)  TempSrc:   Oral Oral  SpO2: 96% 100% 98% 99%  Weight:      Height:  Intake/Output Summary (Last 24 hours) at 08/07/2020 1615 Last data filed at 08/07/2020 0559 Gross per 24 hour  Intake --  Output 400 ml  Net -400 ml   Filed Weights   08/05/20 2345 08/06/20 0558 08/07/20 0559  Weight: 106.8 kg 105.6 kg 105.1 kg    Examination:  General exam: Alert and comfortable not in any kind of distress Respiratory system: Clear to auscultation bilaterally, no wheezing or rhonchi, no tachypnea cardiovascular system: S1-S2 heard,  irregularly irregular Gastrointestinal system: Abdomen is soft, nontender, nondistended, bowel sounds normal Central nervous system:  Alert and oriented, grossly nonfocal Extremities: No cyanosis or clubbing. Skin: No rashes seen Psychiatry: Mood is appropriate    Data Reviewed: I have personally reviewed following labs and imaging studies  CBC: Recent Labs  Lab 08/05/20 0906 08/06/20 0147 08/06/20 1733 08/07/20 0632  WBC 6.0 6.0  --   --   NEUTROABS 4.1  --   --   --   HGB 6.5* 8.7* 8.8* 8.0*  HCT 22.1* 27.0* 27.9* 25.2*  MCV 94.0 91.2  --   --   PLT 305 250  --   --     Basic Metabolic Panel: Recent Labs  Lab 08/05/20 0906 08/05/20 1050 08/06/20 0147 08/07/20 0632  NA 134*  --  135 138  K 4.6  --  4.9 4.1  CL 102  --  105 107  CO2 19*  --  21* 21*  GLUCOSE 107*  --  135* 120*  BUN 26*  --  23 12  CREATININE 1.17*  --  1.48* 1.06*  CALCIUM 9.1  --  8.6* 8.6*  MG  --  1.7  --  1.5*    GFR: Estimated Creatinine Clearance: 49.4 mL/min (A) (by C-G formula based on SCr of 1.06 mg/dL (H)).  Liver Function Tests: Recent Labs  Lab 08/05/20 0906 08/06/20 0147  AST 16 18  ALT 12 11  ALKPHOS 50 52  BILITOT 0.8 2.7*  PROT 6.6 6.3*  ALBUMIN 3.2* 3.1*    CBG: Recent Labs  Lab 08/06/20 2209 08/07/20 0555 08/07/20 1112 08/07/20 1346 08/07/20 1601  GLUCAP 90 109* 125* 153* 174*     Recent Results (from the past 240 hour(s))  Respiratory Panel by RT PCR (Flu A&B, Covid) - Nasopharyngeal Swab     Status: None   Collection Time: 08/05/20  9:06 AM   Specimen: Nasopharyngeal Swab  Result Value Ref Range Status   SARS Coronavirus 2 by RT PCR NEGATIVE NEGATIVE Final    Comment: (NOTE) SARS-CoV-2 target nucleic acids are NOT DETECTED.  The SARS-CoV-2 RNA is generally detectable in upper respiratoy specimens during the acute phase of infection. The lowest concentration of SARS-CoV-2 viral copies this assay can detect is 131 copies/mL. A negative result does not preclude SARS-Cov-2 infection and should not be used as the sole basis for treatment or other patient management decisions. A  negative result may occur with  improper specimen collection/handling, submission of specimen other than nasopharyngeal swab, presence of viral mutation(s) within the areas targeted by this assay, and inadequate number of viral copies (<131 copies/mL). A negative result must be combined with clinical observations, patient history, and epidemiological information. The expected result is Negative.  Fact Sheet for Patients:  https://www.moore.com/  Fact Sheet for Healthcare Providers:  https://www.young.biz/  This test is no t yet approved or cleared by the [place] FDA and  has been authorized for detection and/or diagnosis of SARS-CoV-2 by FDA under an Emergency Use  Authorization (EUA). This EUA will remain  in effect (meaning this test can be used) for the duration of the COVID-19 declaration under Section 564(b)(1) of the Act, 21 U.S.C. section 360bbb-3(b)(1), unless the authorization is terminated or revoked sooner.     Influenza A by PCR NEGATIVE NEGATIVE Final   Influenza B by PCR NEGATIVE NEGATIVE Final    Comment: (NOTE) The Xpert Xpress SARS-CoV-2/FLU/RSV assay is intended as an aid in  the diagnosis of influenza from Nasopharyngeal swab specimens and  should not be used as a sole basis for treatment. Nasal washings and  aspirates are unacceptable for Xpert Xpress SARS-CoV-2/FLU/RSV  testing.  Fact Sheet for Patients: https://www.moore.com/  Fact Sheet for Healthcare Providers: https://www.young.biz/  This test is not yet approved or cleared by the [place] FDA and  has been authorized for detection and/or diagnosis of SARS-CoV-2 by  FDA under an Emergency Use Authorization (EUA). This EUA will remain  in effect (meaning this test can be used) for the duration of the  Covid-19 declaration under Section 564(b)(1) of the Act, 21  U.S.C. section 360bbb-3(b)(1), unless the authorization  is  terminated or revoked. Performed at [facility], [address] [place], [place] [address]          Radiology Studies: No results found.      Scheduled Meds: . dofetilide  500 mcg Oral BID  . insulin aspart  0-15 Units Subcutaneous TID WC  . insulin aspart  0-5 Units Subcutaneous QHS  . metoprolol succinate  50 mg Oral Daily  . pantoprazole (PROTONIX) IV  40 mg Intravenous Q12H   Continuous Infusions: . sodium chloride 75 mL/hr at 08/07/20 1328     LOS: 2 days       Kathlen Mody, MD [facility]   To contact the attending provider between 7A-7P or the covering provider during after hours 7P-7A, please log into the web site [email or web address] and access using universal [place] password for that web site. If you do not have the password, please call the hospital operator.  08/07/2020, 4:15 PM

## 2020-08-07 NOTE — Interval H&P Note (Signed)
History and Physical Interval Note:  08/07/2020 11:05 AM  Carrie Haynes  has presented today for surgery, with the diagnosis of anemia, rectal bleeding.  The various methods of treatment have been discussed with the patient and family. After consideration of risks, benefits and other options for treatment, the patient has consented to  Procedure(s): ESOPHAGOGASTRODUODENOSCOPY (EGD) (N/A) COLONOSCOPY (N/A) as a surgical intervention.  The patient's history has been reviewed, patient examined, no change in status, stable for surgery.  I have reviewed the patient's chart and labs.  Questions were answered to the patient's satisfaction.     Freddy Jaksch

## 2020-08-07 NOTE — Progress Notes (Signed)
Pt left floor for endoscopy/colonoscopy.

## 2020-08-08 LAB — BASIC METABOLIC PANEL
Anion gap: 8 (ref 5–15)
BUN: 12 mg/dL (ref 8–23)
CO2: 23 mmol/L (ref 22–32)
Calcium: 8.6 mg/dL — ABNORMAL LOW (ref 8.9–10.3)
Chloride: 104 mmol/L (ref 98–111)
Creatinine, Ser: 0.97 mg/dL (ref 0.44–1.00)
GFR, Estimated: >60 mL/min (ref >60)
Glucose, Bld: 145 mg/dL — ABNORMAL HIGH (ref 70–99)
Potassium: 4.7 mmol/L (ref 3.5–5.1)
Sodium: 135 mmol/L (ref 135–145)

## 2020-08-08 LAB — RETICULOCYTES
Immature Retic Fract: 34.4 % — ABNORMAL HIGH (ref 2.3–15.9)
RBC.: 2.72 MIL/uL — ABNORMAL LOW (ref 3.87–5.11)
Retic Count, Absolute: 85.4 10*3/uL (ref 19.0–186.0)
Retic Ct Pct: 3.1 % (ref 0.4–3.1)

## 2020-08-08 LAB — GLUCOSE, CAPILLARY
Glucose-Capillary: 119 mg/dL — ABNORMAL HIGH (ref 70–99)
Glucose-Capillary: 182 mg/dL — ABNORMAL HIGH (ref 70–99)
Glucose-Capillary: 142 mg/dL — ABNORMAL HIGH (ref 70–99)
Glucose-Capillary: 136 mg/dL — ABNORMAL HIGH (ref 70–99)

## 2020-08-08 LAB — HEMOGLOBIN AND HEMATOCRIT, BLOOD
HCT: 25.2 % — ABNORMAL LOW (ref 36.0–46.0)
HCT: 25.1 % — ABNORMAL LOW (ref 36.0–46.0)
Hemoglobin: 8 g/dL — ABNORMAL LOW (ref 12.0–15.0)
Hemoglobin: 7.7 g/dL — ABNORMAL LOW (ref 12.0–15.0)

## 2020-08-08 LAB — VITAMIN B12: Vitamin B-12: 260 pg/mL (ref 180–914)

## 2020-08-08 LAB — FERRITIN: Ferritin: 30 ng/mL (ref 11–307)

## 2020-08-08 LAB — FOLATE: Folate: 10.1 ng/mL (ref >5.9)

## 2020-08-08 LAB — IRON AND TIBC
Iron: 28 ug/dL (ref 28–170)
Saturation Ratios: 7 % — ABNORMAL LOW (ref 10.4–31.8)
TIBC: 424 ug/dL (ref 250–450)
UIBC: 396 ug/dL

## 2020-08-08 LAB — MAGNESIUM: Magnesium: 2 mg/dL (ref 1.7–2.4)

## 2020-08-08 MED ORDER — ASPIRIN 81 MG PO CHEW
CHEWABLE_TABLET | ORAL | Status: AC
  Filled 2020-08-08: qty 1

## 2020-08-08 NOTE — Progress Notes (Signed)
Subjective: No abdominal pain. No overt blood in stool.  Objective: Vital signs in last 24 hours: Temp:  [97.5 F (36.4 C)-99 F (37.2 C)] 97.8 F (36.6 C) (10/23 0748) Pulse Rate:  [60-83] 62 (10/23 0748) Resp:  [14-31] 20 (10/23 0748) BP: (91-154)/(47-104) 104/53 (10/23 0748) SpO2:  [96 %-100 %] 100 % (10/23 0748) Weight:  [105.1 kg] 105.1 kg (10/23 0639) Weight change: -0.091 kg Last BM Date: 08/07/20  PE: GEN:  NAD  Lab Results: CBC    Component Value Date/Time   WBC 6.0 08/06/2020 0147   RBC 2.72 (L) 08/08/2020 0624   RBC 2.96 (L) 08/06/2020 0147   HGB 8.0 (L) 08/08/2020 0624   HGB 11.3 08/21/2019 1215   HCT 25.2 (L) 08/08/2020 0624   HCT 34.4 08/21/2019 1215   PLT 250 08/06/2020 0147   PLT 217 08/21/2019 1215   MCV 91.2 08/06/2020 0147   MCV 93 08/21/2019 1215   MCH 29.4 08/06/2020 0147   MCHC 32.2 08/06/2020 0147   RDW 15.2 08/06/2020 0147   RDW 12.8 08/21/2019 1215   LYMPHSABS 1.1 08/05/2020 0906   MONOABS 0.7 08/05/2020 0906   EOSABS 0.1 08/05/2020 0906   BASOSABS 0.0 08/05/2020 0906   CMP     Component Value Date/Time   NA 135 08/08/2020 0624   NA 137 10/10/2019 1025   K 4.7 08/08/2020 0624   CL 104 08/08/2020 0624   CO2 23 08/08/2020 0624   GLUCOSE 145 (H) 08/08/2020 0624   BUN 12 08/08/2020 0624   BUN 25 10/10/2019 1025   CREATININE 0.97 08/08/2020 0624   CALCIUM 8.6 (L) 08/08/2020 0624   PROT 6.3 (L) 08/06/2020 0147   PROT 6.6 08/21/2019 1215   ALBUMIN 3.1 (L) 08/06/2020 0147   ALBUMIN 4.0 08/21/2019 1215   AST 18 08/06/2020 0147   ALT 11 08/06/2020 0147   ALKPHOS 52 08/06/2020 0147   BILITOT 2.7 (H) 08/06/2020 0147   BILITOT 0.8 08/21/2019 1215   GFRNONAA >60 08/08/2020 0624   GFRAA >60 01/21/2020 1355   Assessment:  1.  Blood in stool.  Bright red likely hemorrhoidal, but patient may have slow indolent bleed from elsewhere not identified on egd/colonoscopy. 2.  Anemia, symptomatic. 3.  Chronic anticoagulation, on  hold.  Plan:  1.  Capsule endoscopy to be performed this weekend, and read on Monday. 2.  Continue to hold anticoagulation. 3.  Follow CBC, transfuse as needed. 4.  [facility] will revisit Monday.   Freddy Jaksch 08/08/2020, 8:14 AM   Cell [phone] If no answer or after 5 PM call [phone]

## 2020-08-08 NOTE — Progress Notes (Signed)
PROGRESS NOTE    Carrie Haynes  ZHY:[id number] DOB: [birth year] DOA: 08/05/2020 PCP: Laurann Montana, MD   No chief complaint on file.   Brief Narrative:  79 year old lady prior history of hypertension, type 2 diabetes, paroxysmal atrial fibrillation on Xarelto s/p DCCV last year, chronic combined CHF, DVT, morbid obesity, GERD presents to ED with exertional chest shortness of breath since 1 month she was found to have a hemoglobin of 6.  Patient also reports that she has noticed bright red blood per rectum mixed with stools on and off .  She was admitted for further evaluation to [facility].  Gastroenterology consulted and she is scheduled for EGD and colonoscopy  She underwent 2 units of PRBC transfusion and was started on IV PPI.  Her repeat hemoglobin has improved to 8.  Patient underwent EGD and colonoscopy, EGD was within normal limits and colonoscopy showed internal hemorrhoids.  Her repeat hemoglobin has dropped from 8.7-8.  She is scheduled for capsule endoscopy today and will be read on Monday.  Appreciate GI recommendations Patient seen and examined today denies any new complaints  Assessment & Plan:   Principal Problem:   Symptomatic anemia Active Problems:   A-fib (HCC)   Morbid obesity due to excess calories (HCC)   Hypertension   Hyperlipemia   Diabetes mellitus without complication (HCC)   Exertional shortness of breath probably secondary to combination of symptomatic anemia and chronic systolic heart failure.  Patient's baseline hemoglobin around 11, dropped to 6.5 on admission.  S/p 2 units of PRBC transfusion repeat hemoglobin around 8.7, has been stable around 8.  Patient did not have any more bloody bowel movements at this time. Continue with PPI IV, transfuse to keep hemoglobin greater than 7. GI consulted and underwent EGD which was wnl. Colonoscopy showed internal hemorrhroids. Plan to monitor hemoglobin and transfuse as needed.  Capsule endoscopy this  weekend Anemia panel reviewed showed adequate iron and ferritin. POC occult blood is negative.,  Continue to hold Xarelto.  Avoid NSAID's    Mild AKI Probably secondary to blood loss anemia Creatinine back to baseline is 0.97   Hypomagnesemia:  Replaced.  Repeat in am.  Keep mag >2.    Essential hypertension Blood pressure parameters are borderline to normal.   Paroxysmal atrial fibrillation Rate controlled S/p DCCV. Restarted dofetilide and metoprolol.    Prolonged QTC Repeat EKG show improvement in QTC.    Body mass index is 45.23 kg/m. Diet modification/exercise and weight loss recommended.    Shortness of breath/cough Chest x-ray is negative for pulmonary edema or or any pneumonia.     DVT prophylaxis: SCDs Code Status: Full code Family Communication: None at bedside today Disposition:   Status is: Inpatient  Remains inpatient appropriate because:Ongoing diagnostic testing needed not appropriate for outpatient work up   Dispo: The patient is from: Home              Anticipated d/c is to: Home              Anticipated d/c date is: 2 days              Patient currently is not medically stable to d/c.       Consultants:   Gastroenterology  Procedures: None Antimicrobials: None  Subjective: Patient denies any nausea vomiting or abdominal pain, no chest pain or shortness of breath  Objective: Vitals:   08/08/20 0329 08/08/20 0639 08/08/20 0748 08/08/20 1115  BP: 131/66  (!) 104/53 115/70  Pulse:  69  62 78  Resp: 19  20 20   Temp: 98.3 F (36.8 C)  97.8 F (36.6 C) 97.8 F (36.6 C)  TempSrc: Oral  Oral Oral  SpO2:   100% 98%  Weight:  105.1 kg    Height:        Intake/Output Summary (Last 24 hours) at 08/08/2020 1156 Last data filed at 08/07/2020 1328 Gross per 24 hour  Intake 69.7 ml  Output --  Net 69.7 ml   Filed Weights   08/06/20 0558 08/07/20 0559 08/08/20 0639  Weight: 105.6 kg 105.1 kg 105.1 kg     Examination:  General exam: Alert and comfortable laying in bed. Respiratory system: Diminished air entry at bases, no wheezing or rhonchi, no tachypnea Cardiovascular system: S1-S2 heard, regular rate rhythm no JVD Gastrointestinal system: Abdomen is soft, nontender, nondistended bowel sounds normal Central nervous system: Alert and oriented, grossly nonfocal Extremities: No cyanosis or clubbing Skin: No rashes seen Psychiatry: Mood is appropriate   Data Reviewed: I have personally reviewed following labs and imaging studies  CBC: Recent Labs  Lab 08/05/20 0906 08/05/20 0906 08/06/20 0147 08/06/20 1733 08/07/20 0632 08/07/20 1815 08/08/20 0624  WBC 6.0  --  6.0  --   --   --   --   NEUTROABS 4.1  --   --   --   --   --   --   HGB 6.5*   < > 8.7* 8.8* 8.0* 8.4* 8.0*  HCT 22.1*   < > 27.0* 27.9* 25.2* 26.7* 25.2*  MCV 94.0  --  91.2  --   --   --   --   PLT 305  --  250  --   --   --   --    < > = values in this interval not displayed.    Basic Metabolic Panel: Recent Labs  Lab 08/05/20 0906 08/05/20 1050 08/06/20 0147 08/07/20 0632 08/08/20 0624  NA 134*  --  135 138 135  K 4.6  --  4.9 4.1 4.7  CL 102  --  105 107 104  CO2 19*  --  21* 21* 23  GLUCOSE 107*  --  135* 120* 145*  BUN 26*  --  23 12 12   CREATININE 1.17*  --  1.48* 1.06* 0.97  CALCIUM 9.1  --  8.6* 8.6* 8.6*  MG  --  1.7  --  1.5* 2.0    GFR: Estimated Creatinine Clearance: 54 mL/min (by C-G formula based on SCr of 0.97 mg/dL).  Liver Function Tests: Recent Labs  Lab 08/05/20 0906 08/06/20 0147  AST 16 18  ALT 12 11  ALKPHOS 50 52  BILITOT 0.8 2.7*  PROT 6.6 6.3*  ALBUMIN 3.2* 3.1*    CBG: Recent Labs  Lab 08/07/20 1346 08/07/20 1601 08/07/20 2147 08/08/20 0539 08/08/20 1113  GLUCAP 153* 174* 115* 119* 182*     Recent Results (from the past 240 hour(s))  Respiratory Panel by RT PCR (Flu A&B, Covid) - Nasopharyngeal Swab     Status: None   Collection Time: 08/05/20   9:06 AM   Specimen: Nasopharyngeal Swab  Result Value Ref Range Status   SARS Coronavirus 2 by RT PCR NEGATIVE NEGATIVE Final    Comment: (NOTE) SARS-CoV-2 target nucleic acids are NOT DETECTED.  The SARS-CoV-2 RNA is generally detectable in upper respiratoy specimens during the acute phase of infection. The lowest concentration of SARS-CoV-2 viral copies this assay can detect is 131 copies/mL.  A negative result does not preclude SARS-Cov-2 infection and should not be used as the sole basis for treatment or other patient management decisions. A negative result may occur with  improper specimen collection/handling, submission of specimen other than nasopharyngeal swab, presence of viral mutation(s) within the areas targeted by this assay, and inadequate number of viral copies (<131 copies/mL). A negative result must be combined with clinical observations, patient history, and epidemiological information. The expected result is Negative.  Fact Sheet for Patients:  https://www.moore.com/  Fact Sheet for Healthcare Providers:  https://www.young.biz/  This test is no t yet approved or cleared by the [place] FDA and  has been authorized for detection and/or diagnosis of SARS-CoV-2 by FDA under an Emergency Use Authorization (EUA). This EUA will remain  in effect (meaning this test can be used) for the duration of the COVID-19 declaration under Section 564(b)(1) of the Act, 21 U.S.C. section 360bbb-3(b)(1), unless the authorization is terminated or revoked sooner.     Influenza A by PCR NEGATIVE NEGATIVE Final   Influenza B by PCR NEGATIVE NEGATIVE Final    Comment: (NOTE) The Xpert Xpress SARS-CoV-2/FLU/RSV assay is intended as an aid in  the diagnosis of influenza from Nasopharyngeal swab specimens and  should not be used as a sole basis for treatment. Nasal washings and  aspirates are unacceptable for Xpert Xpress SARS-CoV-2/FLU/RSV   testing.  Fact Sheet for Patients: https://www.moore.com/  Fact Sheet for Healthcare Providers: https://www.young.biz/  This test is not yet approved or cleared by the [place] FDA and  has been authorized for detection and/or diagnosis of SARS-CoV-2 by  FDA under an Emergency Use Authorization (EUA). This EUA will remain  in effect (meaning this test can be used) for the duration of the  Covid-19 declaration under Section 564(b)(1) of the Act, 21  U.S.C. section 360bbb-3(b)(1), unless the authorization is  terminated or revoked. Performed at [facility], [address], [place], [place] [address]          Radiology Studies: No results found.      Scheduled Meds:  aspirin       dofetilide  500 mcg Oral BID   insulin aspart  0-15 Units Subcutaneous TID WC   insulin aspart  0-5 Units Subcutaneous QHS   metoprolol succinate  50 mg Oral Daily   pantoprazole (PROTONIX) IV  40 mg Intravenous Q12H   Continuous Infusions:    LOS: 3 days       Kathlen Mody, MD [facility]   To contact the attending provider between 7A-7P or the covering provider during after hours 7P-7A, please log into the web site [email or web address] and access using universal [place] password for that web site. If you do not have the password, please call the hospital operator.  08/08/2020, 11:56 AM

## 2020-08-09 ENCOUNTER — Encounter (HOSPITAL_COMMUNITY)
Admission: EM | Disposition: A | Discharge status: Home / Self Care | Payer: Self-pay | Source: Home / Self Care | Attending: Internal Medicine

## 2020-08-09 HISTORY — PX: GIVENS CAPSULE STUDY: SHX5432

## 2020-08-09 LAB — BASIC METABOLIC PANEL
Anion gap: 9 (ref 5–15)
BUN: 15 mg/dL (ref 8–23)
CO2: 26 mmol/L (ref 22–32)
Calcium: 9.2 mg/dL (ref 8.9–10.3)
Chloride: 101 mmol/L (ref 98–111)
Creatinine, Ser: 0.98 mg/dL (ref 0.44–1.00)
GFR, Estimated: 60 mL/min — ABNORMAL LOW (ref >60)
Glucose, Bld: 108 mg/dL — ABNORMAL HIGH (ref 70–99)
Potassium: 4.6 mmol/L (ref 3.5–5.1)
Sodium: 136 mmol/L (ref 135–145)

## 2020-08-09 LAB — HEMOGLOBIN AND HEMATOCRIT, BLOOD
HCT: 28.6 % — ABNORMAL LOW (ref 36.0–46.0)
HCT: 26.4 % — ABNORMAL LOW (ref 36.0–46.0)
Hemoglobin: 8.8 g/dL — ABNORMAL LOW (ref 12.0–15.0)
Hemoglobin: 8.2 g/dL — ABNORMAL LOW (ref 12.0–15.0)

## 2020-08-09 LAB — GLUCOSE, CAPILLARY
Glucose-Capillary: 118 mg/dL — ABNORMAL HIGH (ref 70–99)
Glucose-Capillary: 117 mg/dL — ABNORMAL HIGH (ref 70–99)
Glucose-Capillary: 117 mg/dL — ABNORMAL HIGH (ref 70–99)
Glucose-Capillary: 170 mg/dL — ABNORMAL HIGH (ref 70–99)

## 2020-08-09 LAB — MAGNESIUM: Magnesium: 1.7 mg/dL (ref 1.7–2.4)

## 2020-08-09 SURGERY — IMAGING PROCEDURE, GI TRACT, INTRALUMINAL, VIA CAPSULE
Anesthesia: LOCAL

## 2020-08-09 MED ORDER — MAGNESIUM SULFATE 4 GM/100ML IV SOLN
4.0000 g | Freq: Once | INTRAVENOUS | Status: AC
  Administered 2020-08-09: 4 g via INTRAVENOUS
  Filled 2020-08-09: qty 100

## 2020-08-09 SURGICAL SUPPLY — 1 items: TOWEL COTTON PACK 4EA (MISCELLANEOUS) ×4 IMPLANT

## 2020-08-09 NOTE — Progress Notes (Addendum)
Mobility Specialist: Progress Note  See other note.   Carrie Haynes Mobility Specialist  

## 2020-08-09 NOTE — Progress Notes (Signed)
PROGRESS NOTE    Carrie Haynes  TMA:[id number] DOB: [birth year] DOA: 08/05/2020 PCP: Laurann Montana, MD   No chief complaint on file.   Brief Narrative:  79 year old lady prior history of hypertension, type 2 diabetes, paroxysmal atrial fibrillation on Xarelto s/p DCCV last year, chronic combined CHF, DVT, morbid obesity, GERD presents to ED with exertional chest shortness of breath since 1 month she was found to have a hemoglobin of 6.  Patient also reports that she has noticed bright red blood per rectum mixed with stools on and off .  She was admitted for further evaluation to [facility].  Gastroenterology consulted and she is scheduled for EGD and colonoscopy  She underwent 2 units of PRBC transfusion and was started on IV PPI.  Her repeat hemoglobin has improved to 8.  Patient underwent EGD and colonoscopy, EGD was within normal limits and colonoscopy showed internal hemorrhoids.  Her repeat hemoglobin has dropped from 8.7-8.  She is scheduled for capsule endoscopy today and will be read on Monday.  Appreciate GI recommendations Patient seen and examined today denies any new complaints. No nausea, vomiting or abd pain.   Assessment & Plan:   Principal Problem:   Symptomatic anemia Active Problems:   A-fib (HCC)   Morbid obesity due to excess calories (HCC)   Hypertension   Hyperlipemia   Diabetes mellitus without complication (HCC)   Exertional shortness of breath probably secondary to combination of symptomatic anemia and chronic systolic heart failure.  Patient's baseline hemoglobin around 11, dropped to 6.5 on admission.  S/p 2 units of PRBC transfusion repeat hemoglobin around 8.7, has been stable around 8.  Patient did not have any more bloody bowel movements at this time. Continue with PPI IV, transfuse to keep hemoglobin greater than 7. GI consulted and underwent EGD which was wnl. Colonoscopy showed internal hemorrhroids. Plan to monitor hemoglobin and transfuse as needed.   Capsule endoscopy this weekend, results in am.  Anemia panel reviewed showed adequate iron and ferritin. POC occult blood is negative.,  Continue to hold Xarelto.  Avoid NSAID's No bloody bowel movements since admission.     Mild AKI Probably secondary to blood loss anemia resolved.  Creatinine back to baseline is 0.97   Hypomagnesemia:  Replaced.  Repeat in am.  Keep mag >2.    Essential hypertension Blood pressure parameters are borderline to normal.   Paroxysmal atrial fibrillation Rate controlled S/p DCCV. Restarted dofetilide and metoprolol.    Prolonged QTC Repeat EKG show improvement in QTC.    Body mass index is 44.92 kg/m. Diet modification/exercise and weight loss recommended.    Shortness of breath/cough Chest x-ray is negative for pulmonary edema or or any pneumonia.     DVT prophylaxis: SCDs Code Status: Full code Family Communication: family at bedside.  Disposition:   Status is: Inpatient  Remains inpatient appropriate because:Ongoing diagnostic testing needed not appropriate for outpatient work up   Dispo: The patient is from: Home              Anticipated d/c is to: Home              Anticipated d/c date is: 1 day              Patient currently is not medically stable to d/c.       Consultants:   Gastroenterology  Procedures: EGD Colonoscopy Capsule endoscopy.   Antimicrobials: None  Subjective: No new complaints.   Objective: Vitals:   08/09/20 0422 08/09/20  0547 08/09/20 0727 08/09/20 1117  BP: (!) 123/58  (!) 109/48 (!) 106/50  Pulse: 68  61 65  Resp: 20 18 19 20   Temp: 97.7 F (36.5 C)  97.6 F (36.4 C) 98.2 F (36.8 C)  TempSrc: Oral  Oral Oral  SpO2: 97%  100% 99%  Weight:  104.3 kg    Height:        Intake/Output Summary (Last 24 hours) at 08/09/2020 1445 Last data filed at 08/09/2020 0725 Gross per 24 hour  Intake 0 ml  Output --  Net 0 ml   Filed Weights   08/07/20 0559 08/08/20 0639  08/09/20 0547  Weight: 105.1 kg 105.1 kg 104.3 kg    Examination:  General exam: Alert and comfortable on 3 L of nasal cannula oxygen Respiratory system: Air entry fair, no wheezing or rhonchi Cardiovascular system: S1-S2 heard regular rate rhythm, no JVD Gastrointestinal system: Abdomen is soft, nontender bowel sounds normal  Central nervous system: Alert and oriented, grossly normal Extremities: No cyanosis or clubbing Skin: No rashes seen Psychiatry: Mood is appropriate   Data Reviewed: I have personally reviewed following labs and imaging studies  CBC: Recent Labs  Lab 08/05/20 0906 08/05/20 0906 08/06/20 0147 08/06/20 1733 08/07/20 0632 08/07/20 1815 08/08/20 0624 08/08/20 2034 08/09/20 0740  WBC 6.0  --  6.0  --   --   --   --   --   --   NEUTROABS 4.1  --   --   --   --   --   --   --   --   HGB 6.5*   < > 8.7*   < > 8.0* 8.4* 8.0* 7.7* 8.8*  HCT 22.1*   < > 27.0*   < > 25.2* 26.7* 25.2* 25.1* 28.6*  MCV 94.0  --  91.2  --   --   --   --   --   --   PLT 305  --  250  --   --   --   --   --   --    < > = values in this interval not displayed.    Basic Metabolic Panel: Recent Labs  Lab 08/05/20 0906 08/05/20 1050 08/06/20 0147 08/07/20 0632 08/08/20 0624 08/09/20 0740  NA 134*  --  135 138 135 136  K 4.6  --  4.9 4.1 4.7 4.6  CL 102  --  105 107 104 101  CO2 19*  --  21* 21* 23 26  GLUCOSE 107*  --  135* 120* 145* 108*  BUN 26*  --  23 12 12 15   CREATININE 1.17*  --  1.48* 1.06* 0.97 0.98  CALCIUM 9.1  --  8.6* 8.6* 8.6* 9.2  MG  --  1.7  --  1.5* 2.0 1.7    GFR: Estimated Creatinine Clearance: 53.2 mL/min (by C-G formula based on SCr of 0.98 mg/dL).  Liver Function Tests: Recent Labs  Lab 08/05/20 0906 08/06/20 0147  AST 16 18  ALT 12 11  ALKPHOS 50 52  BILITOT 0.8 2.7*  PROT 6.6 6.3*  ALBUMIN 3.2* 3.1*    CBG: Recent Labs  Lab 08/08/20 1113 08/08/20 1639 08/08/20 2107 08/09/20 0602 08/09/20 1114  GLUCAP 182* 142* 136* 118* 117*      Recent Results (from the past 240 hour(s))  Respiratory Panel by RT PCR (Flu A&B, Covid) - Nasopharyngeal Swab     Status: None   Collection Time: 08/05/20  9:06 AM  Specimen: Nasopharyngeal Swab  Result Value Ref Range Status   SARS Coronavirus 2 by RT PCR NEGATIVE NEGATIVE Final    Comment: (NOTE) SARS-CoV-2 target nucleic acids are NOT DETECTED.  The SARS-CoV-2 RNA is generally detectable in upper respiratoy specimens during the acute phase of infection. The lowest concentration of SARS-CoV-2 viral copies this assay can detect is 131 copies/mL. A negative result does not preclude SARS-Cov-2 infection and should not be used as the sole basis for treatment or other patient management decisions. A negative result may occur with  improper specimen collection/handling, submission of specimen other than nasopharyngeal swab, presence of viral mutation(s) within the areas targeted by this assay, and inadequate number of viral copies (<131 copies/mL). A negative result must be combined with clinical observations, patient history, and epidemiological information. The expected result is Negative.  Fact Sheet for Patients:  https://www.moore.com/  Fact Sheet for Healthcare Providers:  https://www.young.biz/  This test is no t yet approved or cleared by the [place] FDA and  has been authorized for detection and/or diagnosis of SARS-CoV-2 by FDA under an Emergency Use Authorization (EUA). This EUA will remain  in effect (meaning this test can be used) for the duration of the COVID-19 declaration under Section 564(b)(1) of the Act, 21 U.S.C. section 360bbb-3(b)(1), unless the authorization is terminated or revoked sooner.     Influenza A by PCR NEGATIVE NEGATIVE Final   Influenza B by PCR NEGATIVE NEGATIVE Final    Comment: (NOTE) The Xpert Xpress SARS-CoV-2/FLU/RSV assay is intended as an aid in  the diagnosis of influenza from  Nasopharyngeal swab specimens and  should not be used as a sole basis for treatment. Nasal washings and  aspirates are unacceptable for Xpert Xpress SARS-CoV-2/FLU/RSV  testing.  Fact Sheet for Patients: https://www.moore.com/  Fact Sheet for Healthcare Providers: https://www.young.biz/  This test is not yet approved or cleared by the [place] FDA and  has been authorized for detection and/or diagnosis of SARS-CoV-2 by  FDA under an Emergency Use Authorization (EUA). This EUA will remain  in effect (meaning this test can be used) for the duration of the  Covid-19 declaration under Section 564(b)(1) of the Act, 21  U.S.C. section 360bbb-3(b)(1), unless the authorization is  terminated or revoked. Performed at [facility], [address], [place], [place] [address]          Radiology Studies: No results found.      Scheduled Meds: . dofetilide  500 mcg Oral BID  . insulin aspart  0-15 Units Subcutaneous TID WC  . insulin aspart  0-5 Units Subcutaneous QHS  . metoprolol succinate  50 mg Oral Daily  . pantoprazole (PROTONIX) IV  40 mg Intravenous Q12H   Continuous Infusions: . magnesium sulfate bolus IVPB       LOS: 4 days       Kathlen Mody, MD [facility]   To contact the attending provider between 7A-7P or the covering provider during after hours 7P-7A, please log into the web site [email or web address] and access using universal [place] password for that web site. If you do not have the password, please call the hospital operator.  08/09/2020, 2:45 PM

## 2020-08-09 NOTE — Progress Notes (Signed)
Patient ambulate on own, without notifying staff, to the bathroom. Patient took off oxygen and O2 sensor to do so. Was assisted back to bed and when O2 saturation was measured it read at 78% with a good pleth wave. The patient was SOB. Oxygen was reapplied and patient recovered to mid to high 90s within minutes. Patient educated to call for assistance when ambulating and verbalized understanding. Physician notified. Will continue to monitor.   Estella Husk, RN

## 2020-08-09 NOTE — Progress Notes (Signed)
Pharmacy: Dofetilide (Tikosyn) - Follow Up Assessment and Electrolyte Replacement  Pharmacy consulted to assist in monitoring and replacing electrolytes in this 79 y.o. female admitted on 08/05/2020 undergoing dofetilide re-initiation. First dofetilide dose: 125 mcg (reduced for AKI) > renal function improved 10/22, increase back to 500 mcg home dose   Labs:    Component Value Date/Time   K 4.6 08/09/2020 0740   MG 1.7 08/09/2020 0740     Plan: Potassium: K >/= 4: No additional supplementation needed  Magnesium: Mg 1.3-1.7: Give Mg 4 gm IV x1  Thank you for allowing pharmacy to participate in this patient's care    Kinnie Feil, PharmD PGY1 Acute Care Pharmacy Resident Phone: [phone]  08/09/2020 1:51 PM  Please check [email or web address] for unit specific pharmacy phone numbers.

## 2020-08-09 NOTE — Plan of Care (Signed)
  Problem: Education: Goal: Ability to identify signs and symptoms of gastrointestinal bleeding will improve Outcome: Progressing   

## 2020-08-09 NOTE — Progress Notes (Addendum)
Mobility Specialist: Progress Note   08/09/20 1607  Mobility  Activity Ambulated in hall  Level of Assistance Modified independent, requires aide device or extra time  Assistive Device Front wheel walker  Distance Ambulated (ft) 220 ft  Mobility Response Tolerated fair  Mobility performed by Mobility specialist  Bed Position High-fowlers  $Mobility charge 1 Mobility   Pre-Mobility: 72 HR, 111/51 BP, 100% SpO2 During Mobility: 89-90% SpO2 Post-Mobility:   Immediately: 80 HR, 122/57 BP, 85% SpO2 on 2 L/min  Sitting on EOB for a few minutes: 95% on 2.5 L/min  Pt felt SOB towards end of ambulation. I checked pt's sats and she was 89-90%. After returning to room pt desat to 85% on 2 L/min. O2 was increased to 3 L/min and after a few minutes of sitting on the EOB pt's sats returned to 95%. Pt is resting in bed on 2 L/min [place] and sats are holding in the mid 90s.   [facility] Mobility Specialist

## 2020-08-09 NOTE — Progress Notes (Signed)
Patient swallows Given PillCam without any difficulties at 0844. Patient and bedside RN educated on instruction sheet provided. Both patient and bedside RN verbalized understanding.

## 2020-08-09 NOTE — Progress Notes (Signed)
Capsule study in process today.  [facility] will revisit tomorrow; disposition will in large part depend upon results of capsule endoscopy.

## 2020-08-10 ENCOUNTER — Inpatient Hospital Stay (HOSPITAL_COMMUNITY): Payer: Medicare Other

## 2020-08-10 ENCOUNTER — Encounter (HOSPITAL_COMMUNITY): Payer: Self-pay | Admitting: Gastroenterology

## 2020-08-10 LAB — GLUCOSE, CAPILLARY
Glucose-Capillary: 122 mg/dL — ABNORMAL HIGH (ref 70–99)
Glucose-Capillary: 127 mg/dL — ABNORMAL HIGH (ref 70–99)
Glucose-Capillary: 180 mg/dL — ABNORMAL HIGH (ref 70–99)
Glucose-Capillary: 259 mg/dL — ABNORMAL HIGH (ref 70–99)

## 2020-08-10 LAB — HEMOGLOBIN AND HEMATOCRIT, BLOOD
HCT: 25.6 % — ABNORMAL LOW (ref 36.0–46.0)
HCT: 27.5 % — ABNORMAL LOW (ref 36.0–46.0)
Hemoglobin: 7.9 g/dL — ABNORMAL LOW (ref 12.0–15.0)
Hemoglobin: 8.4 g/dL — ABNORMAL LOW (ref 12.0–15.0)

## 2020-08-10 MED ORDER — IOHEXOL 300 MG/ML  SOLN
75.0000 mL | Freq: Once | INTRAMUSCULAR | Status: AC | PRN
  Administered 2020-08-10: 75 mL via INTRAVENOUS

## 2020-08-10 MED ORDER — PREDNISONE 20 MG PO TABS
60.0000 mg | ORAL_TABLET | Freq: Every day | ORAL | Status: DC
  Administered 2020-08-10 – 2020-08-11 (×2): 60 mg via ORAL
  Filled 2020-08-10 (×2): qty 3

## 2020-08-10 NOTE — Progress Notes (Signed)
PROGRESS NOTE    Carrie FOSSUM  DXI:[id number] DOB: [birth year] DOA: 08/05/2020 PCP: Laurann Montana, MD   No chief complaint on file.   Brief Narrative:  79 year old lady prior history of hypertension, type 2 diabetes, paroxysmal atrial fibrillation on Xarelto s/p DCCV last year, chronic combined CHF, DVT, morbid obesity, GERD presents to ED with exertional chest shortness of breath since 1 month she was found to have a hemoglobin of 6.  Patient also reports that she has noticed bright red blood per rectum mixed with stools on and off .  She was admitted for further evaluation to [facility].  Gastroenterology consulted and she is scheduled for EGD and colonoscopy  She underwent 2 units of PRBC transfusion and was started on IV PPI.  Her repeat hemoglobin has improved to 8.  Patient underwent EGD and colonoscopy, EGD was within normal limits and colonoscopy showed internal hemorrhoids.  Her repeat hemoglobin has dropped from 8.7-8.  She is scheduled for capsule endoscopy today and will be read on Monday.  Appreciate GI recommendations Patient seen and examined today, pt reports some sob. 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 Sherene Sires on discharge. Currently waiting for capsule endoscopy results  Assessment & Plan:   Principal Problem:   Symptomatic anemia Active Problems:   A-fib (HCC)   Morbid obesity due to excess calories (HCC)   Hypertension   Hyperlipemia   Diabetes mellitus without complication (HCC)   Exertional shortness of breath probably secondary to combination of symptomatic anemia and chronic systolic heart failure.  Patient's baseline hemoglobin around 11, dropped to 6.5 on admission.  S/p 2 units of PRBC transfusion repeat hemoglobin around 8.7, has been stable around 8.  Hemoglobin is 7.9 this morning.  Patient did not have any more bloody  bowel movements at this time. Continue with PPI IV, transfuse to keep hemoglobin greater than 7. GI consulted and underwent EGD which was wnl. Colonoscopy showed internal hemorrhroids. Plan to monitor hemoglobin and transfuse as needed.  Capsule endoscopy done this weekend and results are pending Anemia panel reviewed showed adequate iron and ferritin. POC occult blood is negative.,  Continue to hold Xarelto.  Avoid NSAID's No bloody bowel movements since admission.     Mild AKI Probably secondary to blood loss anemia resolved.  Creatinine back to baseline is 0.97   Hypomagnesemia:  Replaced.  Repeat in am.  Keep mag >2.    Essential hypertension Blood pressure parameters are borderline to normal.   Paroxysmal atrial fibrillation Rate controlled S/p DCCV. Restarted dofetilide and metoprolol.    Prolonged QTC Repeat EKG show improvement in QTC.    Body mass index is 45 kg/m. Diet modification/exercise and weight loss recommended.    Shortness of breath/cough/hypoxia requiring up to 3 L of nasal cannula oxygen during ambulation at 2 L of nasal cannula oxygen at rest Chest x-ray is negative for pulmonary edema or or any pneumonia. 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 Patient will need oxygen on discharge. She was started on oral prednisone to see if further symptoms of shortness of breath and cough will improve.     DVT prophylaxis: SCDs Code Status: Full code Family Communication: None at bedside Disposition:   Status is: Inpatient  Remains inpatient appropriate because:Ongoing diagnostic testing needed not appropriate for outpatient work up   Dispo: The patient is from: Home  Anticipated d/c is to: Home              Anticipated d/c date is: 1 day              Patient currently is not medically stable to d/c.       Consultants:   Gastroenterology  Procedures: EGD Colonoscopy Capsule endoscopy.    Antimicrobials: None  Subjective: No chest pain, some shortness of breath, hypoxia requiring up to 2 L of oxygen at rest, cough Objective: Vitals:   08/10/20 0006 08/10/20 0402 08/10/20 0545 08/10/20 0919  BP: (!) 114/51 (!) 117/46  (!) 99/57  Pulse: 82 74  76  Resp: 18 19 18    Temp: 98.3 F (36.8 C) 97.7 F (36.5 C)    TempSrc: Oral Oral    SpO2: 96% 97%    Weight:   104.5 kg   Height:        Intake/Output Summary (Last 24 hours) at 08/10/2020 1126 Last data filed at 08/09/2020 2000 Gross per 24 hour  Intake 460 ml  Output --  Net 460 ml   Filed Weights   08/08/20 0639 08/09/20 0547 08/10/20 0545  Weight: 105.1 kg 104.3 kg 104.5 kg    Examination:  General exam: Alert and comfortable on 2 L of oxygen in room air Respiratory system: Air entry fair bilateral, no wheezing or rhonchi Cardiovascular system: S1-S2 heard, regular rate rhythm, no JVD, no pedal edema Gastrointestinal system: Abdomen is soft, nontender, nondistended, bowel sounds normal  Central nervous system: Alert and oriented grossly nonfocal. Extremities: No cyanosis or clubbing. Skin: No rashes seen Psychiatry: Mood is appropriate  Data Reviewed: I have personally reviewed following labs and imaging studies  CBC: Recent Labs  Lab 08/05/20 0906 08/05/20 0906 08/06/20 0147 08/06/20 1733 08/08/20 0624 08/08/20 2034 08/09/20 0740 08/09/20 1726 08/10/20 0615  WBC 6.0  --  6.0  --   --   --   --   --   --   NEUTROABS 4.1  --   --   --   --   --   --   --   --   HGB 6.5*   < > 8.7*   < > 8.0* 7.7* 8.8* 8.2* 7.9*  HCT 22.1*   < > 27.0*   < > 25.2* 25.1* 28.6* 26.4* 25.6*  MCV 94.0  --  91.2  --   --   --   --   --   --   PLT 305  --  250  --   --   --   --   --   --    < > = values in this interval not displayed.    Basic Metabolic Panel: Recent Labs  Lab 08/05/20 0906 08/05/20 1050 08/06/20 0147 08/07/20 0632 08/08/20 0624 08/09/20 0740  NA 134*  --  135 138 135 136  K 4.6  --  4.9  4.1 4.7 4.6  CL 102  --  105 107 104 101  CO2 19*  --  21* 21* 23 26  GLUCOSE 107*  --  135* 120* 145* 108*  BUN 26*  --  23 12 12 15   CREATININE 1.17*  --  1.48* 1.06* 0.97 0.98  CALCIUM 9.1  --  8.6* 8.6* 8.6* 9.2  MG  --  1.7  --  1.5* 2.0 1.7    GFR: Estimated Creatinine Clearance: 53.3 mL/min (by C-G formula based on SCr of 0.98 mg/dL).  Liver Function Tests:  Recent Labs  Lab 08/05/20 0906 08/06/20 0147  AST 16 18  ALT 12 11  ALKPHOS 50 52  BILITOT 0.8 2.7*  PROT 6.6 6.3*  ALBUMIN 3.2* 3.1*    CBG: Recent Labs  Lab 08/09/20 1114 08/09/20 1620 08/09/20 2041 08/10/20 0547 08/10/20 1110  GLUCAP 117* 117* 170* 122* 127*     Recent Results (from the past 240 hour(s))  Respiratory Panel by RT PCR (Flu A&B, Covid) - Nasopharyngeal Swab     Status: None   Collection Time: 08/05/20  9:06 AM   Specimen: Nasopharyngeal Swab  Result Value Ref Range Status   SARS Coronavirus 2 by RT PCR NEGATIVE NEGATIVE Final    Comment: (NOTE) SARS-CoV-2 target nucleic acids are NOT DETECTED.  The SARS-CoV-2 RNA is generally detectable in upper respiratoy specimens during the acute phase of infection. The lowest concentration of SARS-CoV-2 viral copies this assay can detect is 131 copies/mL. A negative result does not preclude SARS-Cov-2 infection and should not be used as the sole basis for treatment or other patient management decisions. A negative result may occur with  improper specimen collection/handling, submission of specimen other than nasopharyngeal swab, presence of viral mutation(s) within the areas targeted by this assay, and inadequate number of viral copies (<131 copies/mL). A negative result must be combined with clinical observations, patient history, and epidemiological information. The expected result is Negative.  Fact Sheet for Patients:  https://www.moore.com/  Fact Sheet for Healthcare Providers:   https://www.young.biz/  This test is no t yet approved or cleared by the [place] FDA and  has been authorized for detection and/or diagnosis of SARS-CoV-2 by FDA under an Emergency Use Authorization (EUA). This EUA will remain  in effect (meaning this test can be used) for the duration of the COVID-19 declaration under Section 564(b)(1) of the Act, 21 U.S.C. section 360bbb-3(b)(1), unless the authorization is terminated or revoked sooner.     Influenza A by PCR NEGATIVE NEGATIVE Final   Influenza B by PCR NEGATIVE NEGATIVE Final    Comment: (NOTE) The Xpert Xpress SARS-CoV-2/FLU/RSV assay is intended as an aid in  the diagnosis of influenza from Nasopharyngeal swab specimens and  should not be used as a sole basis for treatment. Nasal washings and  aspirates are unacceptable for Xpert Xpress SARS-CoV-2/FLU/RSV  testing.  Fact Sheet for Patients: https://www.moore.com/  Fact Sheet for Healthcare Providers: https://www.young.biz/  This test is not yet approved or cleared by the [place] FDA and  has been authorized for detection and/or diagnosis of SARS-CoV-2 by  FDA under an Emergency Use Authorization (EUA). This EUA will remain  in effect (meaning this test can be used) for the duration of the  Covid-19 declaration under Section 564(b)(1) of the Act, 21  U.S.C. section 360bbb-3(b)(1), unless the authorization is  terminated or revoked. Performed at [facility], [address], [place], [place] [address]          Radiology Studies: CT CHEST W CONTRAST  Result Date: 08/10/2020 CLINICAL DATA:  Pneumonia EXAM: CT CHEST WITH CONTRAST TECHNIQUE: Multidetector CT imaging of the chest was performed during intravenous contrast administration. CONTRAST:  89mL OMNIPAQUE IOHEXOL 300 MG/ML  SOLN COMPARISON:  Chest radiographs, 08/05/2020, CT chest angiogram, 09/03/2019, 05/10/2017, 03/03/2009 FINDINGS:  Cardiovascular: Aortic atherosclerosis. Mild cardiomegaly. Left coronary artery calcifications. No pericardial effusion. Mediastinum/Nodes: Unchanged prominent pretracheal lymph nodes. Thyroid gland, trachea, and esophagus demonstrate no significant findings. Lungs/Pleura: Redemonstrated mild to moderate pulmonary fibrosis with apical to basal gradient featuring irregular  peripheral interstitial opacity, interstitial septal thickening, and areas of subpleural bronchiolectasis at the lung bases. No acutely superimposed airspace disease. No pleural effusion or pneumothorax. Upper Abdomen: No acute abnormality. Musculoskeletal: No chest wall mass or suspicious bone lesions identified. IMPRESSION: 1. Redemonstrated mild to moderate pulmonary fibrosis with apical to basal gradient featuring irregular peripheral interstitial opacity, interstitial septal thickening, and areas of subpleural bronchiolectasis at the lung bases. Findings are much more clearly assessed on this examination when compared to very motion limited exam dated 04/18/2019 and worsened over a long period of time on examinations dating back to 2010. 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 Findings are categorized as probable UIP per consensus guidelines: Diagnosis of Idiopathic Pulmonary Fibrosis: An Official ATS/ERS/JRS/ALAT Clinical Practice Guideline. Am Rosezetta Schlatter Crit Care Med Vol 198, Iss 5, [phone], Jun 17 2017. 2. No acutely superimposed airspace disease. 3. Coronary artery disease.  Aortic Atherosclerosis (ICD10-I70.0). Electronically Signed   By: Lauralyn Primes M.D.   On: 08/10/2020 08:21        Scheduled Meds: . dofetilide  500 mcg Oral BID  . insulin aspart  0-15 Units Subcutaneous TID WC  . insulin aspart  0-5 Units Subcutaneous QHS  . metoprolol succinate  50 mg Oral Daily  . pantoprazole (PROTONIX) IV  40 mg Intravenous Q12H  . predniSONE  60 mg Oral QAC breakfast   Continuous  Infusions:    LOS: 5 days       Kathlen Mody, MD [facility]   To contact the attending provider between 7A-7P or the covering provider during after hours 7P-7A, please log into the web site [email or web address] and access using universal [place] password for that web site. If you do not have the password, please call the hospital operator.  08/10/2020, 11:26 AM

## 2020-08-10 NOTE — Care Management Important Message (Signed)
Important Message  Patient Details  Name: Carrie Haynes MRN: [id number] Date of Birth: 1941/09/25   Medicare Important Message Given:  Yes     Renie Ora 08/10/2020, 9:56 AM

## 2020-08-10 NOTE — Progress Notes (Addendum)
[facility] Gastroenterology Progress Note  Carrie Haynes 79 y.o. [birth year]   Subjective: Feels ok. Recent BM and denies bleeding. Denies abdominal pain. Sitting on side of bed. Husband at bedside.  Objective: Vital signs: Vitals:   08/10/20 0919 08/10/20 1205  BP: (!) 99/57 (!) 116/46  Pulse: 76 76  Resp:  15  Temp:  98.4 F (36.9 C)  SpO2:  97%    Physical Exam: Gen: alert, no acute distress, morbidly obese, pleasant HEENT: anicteric sclera CV: RRR Chest: CTA B Abd: soft, nontender, nondistended, +BS Ext: no edema  Lab Results: Recent Labs    08/08/20 0624 08/09/20 0740  NA 135 136  K 4.7 4.6  CL 104 101  CO2 23 26  GLUCOSE 145* 108*  BUN 12 15  CREATININE 0.97 0.98  CALCIUM 8.6* 9.2  MG 2.0 1.7   No results for input(s): AST, ALT, ALKPHOS, BILITOT, PROT, ALBUMIN in the last 72 hours. Recent Labs    08/09/20 1726 08/10/20 0615  HGB 8.2* 7.9*  HCT 26.4* 25.6*      Assessment/Plan: Obscure occult GI bleeding - Capsule study normal. No source seen on recent colonoscopy/EGD. Needs outpatient hematology evaluation. Ok to d/c today or tomorrow from GI standpoint. No further recs. F/U with GI prn. D/W Dr. Blake Divine.   Shirley Friar 08/10/2020, 12:44 PM  Questions please call [phone] ID: Carrie Haynes, female   DOB: 1940/12/19, 79 y.o.   MRN: [id number]

## 2020-08-10 NOTE — Progress Notes (Signed)
Patient ID: Carrie Haynes, female   DOB: 1941/03/16, 79 y.o.   MRN: [id number]  Capsule endoscopy (complete report to be scanned into Epic):  Normal capsule endoscopy. Capsule reached the cecum. Small bowel passage time: 3 hr 11 min.   Summary/Recs: Normal capsule endoscopy. No source for anemia seen. Hematology outpt referral recommended.

## 2020-08-11 ENCOUNTER — Telehealth: Payer: Self-pay | Admitting: Family

## 2020-08-11 DIAGNOSIS — I4819 Other persistent atrial fibrillation: Secondary | ICD-10-CM

## 2020-08-11 LAB — BASIC METABOLIC PANEL
Anion gap: 11 (ref 5–15)
BUN: 18 mg/dL (ref 8–23)
CO2: 24 mmol/L (ref 22–32)
Calcium: 9.4 mg/dL (ref 8.9–10.3)
Chloride: 101 mmol/L (ref 98–111)
Creatinine, Ser: 0.91 mg/dL (ref 0.44–1.00)
GFR, Estimated: >60 mL/min (ref >60)
Glucose, Bld: 192 mg/dL — ABNORMAL HIGH (ref 70–99)
Potassium: 5 mmol/L (ref 3.5–5.1)
Sodium: 136 mmol/L (ref 135–145)

## 2020-08-11 LAB — HEMOGLOBIN AND HEMATOCRIT, BLOOD
HCT: 27.2 % — ABNORMAL LOW (ref 36.0–46.0)
Hemoglobin: 8.4 g/dL — ABNORMAL LOW (ref 12.0–15.0)

## 2020-08-11 LAB — GLUCOSE, CAPILLARY
Glucose-Capillary: 126 mg/dL — ABNORMAL HIGH (ref 70–99)
Glucose-Capillary: 149 mg/dL — ABNORMAL HIGH (ref 70–99)

## 2020-08-11 LAB — MAGNESIUM: Magnesium: 1.8 mg/dL (ref 1.7–2.4)

## 2020-08-11 MED ORDER — PREDNISONE 20 MG PO TABS
ORAL_TABLET | ORAL | 0 refills | Status: DC
Start: 2020-08-11 — End: 2020-08-19

## 2020-08-11 NOTE — Progress Notes (Signed)
Pt sits in bed, quietly on 2 L [place]. !00% no [place] as well when resting, not talking. STood up, walked 3-5 steps. O2 dropped to 89%. Carrie Haynes was replaced and started in 2 L again. Started to walk, O2 dropped as low as 82% when walking AND talking. Pt encouraged to not talk and take a deep breaths. O2 returned to 90. Ambulated in the hall about 25 feet. Pt seems out of breath, returned to bed.  After sitting down and rest, within a minute O2 is 100% on 2LNC.  Nurse notified. Waiting on oxygen tank.

## 2020-08-11 NOTE — Telephone Encounter (Signed)
I called and LMVM for patient regarding New Patient appointment that has been scheudled .  I have mailed a New Patient Packet to her as well.

## 2020-08-11 NOTE — TOC Transition Note (Signed)
Transition of Care [facility]) - CM/SW Discharge Note   Patient Details  Name: Carrie Haynes MRN: [id number] Date of Birth: May 28, 1941  Transition of Care [facility]) CM/SW Contact:  Lawerance Sabal, RN Phone Number: 08/11/2020, 10:05 AM   Clinical Narrative:   Sherron Monday w patient at bedside. Explained home oxygen set up. Oxygen ordered through [employer] for POC to be delivered to room prior to DC- still awaiting oxygen qualification note for today- RN is aware this needs documented prior to order being filled. Patient states that her S.O. will be providing transport home, hopefully around 12:00 if O2 delivered to the room by then.  No other CM needs identified.      Final next level of care: Home/Self Care Barriers to Discharge: No Barriers Identified   Patient Goals and CMS Choice Patient states their goals for this hospitalization and ongoing recovery are:: to go home      Discharge Placement                       Discharge Plan and Services                DME Arranged: Oxygen DME Agency: [employer] Date DME Agency Contacted: 08/11/20 Time DME Agency Contacted: 1005 Representative spoke with at DME Agency: Cassie            Social Determinants of Health (SDOH) Interventions     Readmission Risk Interventions No flowsheet data found.

## 2020-08-11 NOTE — Progress Notes (Deleted)
Pharmacy: Dofetilide (Tikosyn) - Follow Up Assessment and Electrolyte Replacement  Pharmacy consulted to assist in monitoring and replacing electrolytes in this 79 y.o. female admitted on 08/05/2020 on dofetilide (continuing from home)  Labs:    Component Value Date/Time   K 5.0 08/11/2020 0733   MG 1.8 08/11/2020 0733     Plan: Potassium: K >/= 4: No additional supplementation needed  Magnesium: Mg 1.8-2: Give Mg 2 gm IV x1    Thank you for allowing pharmacy to participate in this patient's care   Harland German, PharmD Clinical Pharmacist **Pharmacist phone directory can now be found on [email or web address] ([email or web address]).  Listed under [facility].

## 2020-08-11 NOTE — Progress Notes (Signed)
Education and discharge papers were given to the patient. All questions are answered. IV removed and intact. VS stable. Family at the bedside. Waiting on Oxygen to go home with.

## 2020-08-11 NOTE — Discharge Summary (Signed)
Physician Discharge Summary  Carrie Haynes VQM:[id number] DOB: January 07, 1941 DOA: 08/05/2020  PCP: Laurann Montana, MD  Admit date: 08/05/2020 Discharge date: 08/11/2020  Admitted From: Home.  Disposition: Home.   Recommendations for Outpatient Follow-up:  1. Follow up with PCP in 1-2 weeks 2. Please obtain BMP/CBC in one week Please follow up with Dr Sherene Sires in one week.  Please follow up with hematology for evaluation of anemia.    Equipment/Devices:oxygen.   Discharge Condition: stable.  CODE STATUS: FULL CODE.  Diet recommendation: Heart Healthy   Brief/Interim Summary: 79 year old lady prior history of hypertension, type 2 diabetes, paroxysmal atrial fibrillation on Xarelto s/p DCCV last year, chronic combined CHF, DVT, morbid obesity, GERD presents to ED with exertional chest shortness of breath since 1 month she was found to have a hemoglobin of 6.  Patient also reports that she has noticed bright red blood per rectum mixed with stools on and off .  She was admitted for further evaluation to [facility]  Gastroenterology consulted and she is scheduled for EGD and colonoscopy  She underwent 2 units of PRBC transfusion and was started on IV PPI.  Her repeat hemoglobin has improved to 8.  Patient underwent EGD and colonoscopy, EGD was within normal limits and colonoscopy showed internal hemorrhoids.  Her repeat hemoglobin has dropped from 8.7 TO 8. It has been stable around 8 so far.  Her capsule endoscopy is normal. She has no evidence of GI bleeding. She is recommended to restart her anti coagulation. Meanwhile she has been complaining of sob and cough for more than 8 months . We ordered  CT chest with contrast , showed idiopathic pulmonary fibrosis which would explain the shortness of breath, cough and hypoxia recommended outpatient follow-up with pulmonologist Dr. Sherene Sires on discharge. She was started on steroids and recommended to follow up with pulmonology prior to discharge.  Ambulating  oxygen levels were checked and she required between 2 to3 lit on ambulation while resting oxygen levels were at 92%.   Discharge Diagnoses:  Principal Problem:   Symptomatic anemia Active Problems:   A-fib (HCC)   Morbid obesity due to excess calories (HCC)   Hypertension   Hyperlipemia   Diabetes mellitus without complication (HCC)  Exertional shortness of breath probably secondary to combination of symptomatic anemia and chronic systolic heart failure and pulmonary fibrosis.  Patient's baseline hemoglobin around 11, dropped to 6.5 on admission.  S/p 2 units of PRBC transfusion repeat hemoglobin around 8.7, has been stable around 8.   Patient did not have any more bloody bowel movements at this time. GI consulted and underwent EGD which was wnl. Colonoscopy showed internal hemorrhroids. Capsule endoscopy was also normal. She is referred to hematology for further work up of the anemia. Anemia panel reviewed showed adequate iron and ferritin. POC occult blood is negative.,  Restarted her anticoagulation.     Mild AKI Probably secondary to blood loss anemia resolved.  Creatinine back to baseline is 0.97   Hypomagnesemia:  Replaced.  Repeat in am.  Keep mag >2.    Essential hypertension Blood pressure parameters are borderline to normal.   Paroxysmal atrial fibrillation Rate controlled S/p DCCV. Restarted dofetilide and metoprolol, along with anti coagulation on discharge.     Prolonged QTC Repeat EKG show improvement in QTC.    Body mass index is 45 kg/m. Diet modification/exercise and weight loss recommended.    Shortness of breath/cough/hypoxia requiring up to 3 L of nasal cannula oxygen during ambulation at 2 L of  nasal cannula oxygen at rest Chest x-ray is negative for pulmonary edema or or any pneumonia. 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 Patient will need oxygen on discharge. She was started on oral prednisone  to see if symptoms of shortness of breath and cough will improve. Recommend outpatient follow up with Dr Sherene Sires for the new pulm fibrosis diagnosis.      Discharge Instructions  Discharge Instructions    Ambulatory referral to Hematology / Oncology   Complete by: As directed    Diet - low sodium heart healthy   Complete by: As directed    Increase activity slowly   Complete by: As directed      Allergies as of 08/11/2020      Reactions   Calcium Channel Blockers Other (See Comments)   Acute hypotension, patient became brady/asystole < 5 seconds      Medication List    TAKE these medications   cetirizine 10 MG tablet Commonly known as: ZYRTEC Take 10 mg by mouth daily as needed for allergies.   dofetilide 500 MCG capsule Commonly known as: TIKOSYN TAKE 1 CAPSULE BY MOUTH TWICE A DAY   FreeStyle Precision Neo Test test strip Generic drug: glucose blood USE AS DIRECTED TO TEST BLOOD SUGAR DAILY E11.21   losartan 50 MG tablet Commonly known as: COZAAR Take 50 mg by mouth daily.   Magnesium Oxide 400 MG Caps Take 1 capsule (400 mg total) by mouth 2 (two) times daily. What changed:   how much to take  when to take this   metFORMIN 500 MG 24 hr tablet Commonly known as: GLUCOPHAGE-XR Take 1,500 mg by mouth at bedtime.   metoprolol succinate 50 MG 24 hr tablet Commonly known as: TOPROL-XL Take 1 tablet (50 mg total) by mouth daily. Take with or immediately following a meal.   omeprazole 40 MG capsule Commonly known as: PRILOSEC Take 40 mg by mouth every morning.   predniSONE 20 MG tablet Commonly known as: DELTASONE Prednisone 40 mg daily for 7 days.   Vitamin D-3 125 MCG (5000 UT) Tabs Take 5,000 Units by mouth daily.   Xarelto 20 MG Tabs tablet Generic drug: rivaroxaban TAKE 1 TABLET (20 MG TOTAL) BY MOUTH DAILY WITH SUPPER. What changed: See the new instructions.            Durable Medical Equipment  (From admission, onward)          Start     Ordered   08/11/20 0927  DME Oxygen  Once       Comments: For idiopathic pulmonary fibrosis.  Question Answer Comment  Length of Need Lifetime   Mode or (Route) Nasal cannula   Liters per Minute 3   Frequency Continuous (stationary and portable oxygen unit needed)   Oxygen conserving device Yes   Oxygen delivery system Gas      08/11/20 0926          Follow-up Information    Laurann Montana, MD. Schedule an appointment as soon as possible for a visit in 1 week(s).   Specialty: Family Medicine Contact information: [address] [place] [address] [phone]        Thurmon Fair, MD .   Specialty: Cardiology Contact information: [address] [place] [address] [phone]        Nyoka Cowden, MD. Schedule an appointment as soon as possible for a visit in 1 week(s).   Specialty: Pulmonary Disease Why: for idiopathic  pulmonary fibrosis.  Contact information: [address] [place] [address] [phone]        [facility], [facility] Follow up.   Why: For home oxygen. portable oxygen concentrator will be delivered to the room prior to DC Contact information: [address] [address] [phone]              Allergies  Allergen Reactions  . Calcium Channel Blockers Other (See Comments)    Acute hypotension, patient became brady/asystole < 5 seconds    Consultations:  Gastroenterology.    Procedures/Studies: DG Chest 2 View  Result Date: 08/05/2020 CLINICAL DATA:  Cough and shortness of breath EXAM: CHEST - 2 VIEW COMPARISON:  07/28/2020 FINDINGS: Chronic interstitial prominence. No focal consolidation. No pleural effusion or pneumothorax. Stable cardiomegaly. No acute osseous abnormality IMPRESSION: No acute process in the chest.  Chronic interstitial lung disease. Stable cardiomegaly. Electronically Signed   By: Guadlupe Spanish M.D.   On: 08/05/2020  10:15   DG Chest 2 View  Result Date: 07/30/2020 CLINICAL DATA:  Shortness of breath. Dyspnea on exertion. EXAM: CHEST - 2 VIEW COMPARISON:  Radiograph 09/02/2019. CT 09/03/2019 FINDINGS: Lung volumes are low. The heart is enlarged. Hilar prominence likely vascular and related to low lung volumes. There is chronic interstitial coarsening which may be accentuated by lower lung volumes. Slight ill-defined subpleural opacity at the periphery of the left lung base. No confluent consolidation. No pneumothorax or pleural effusion. No convincing pulmonary edema. No acute osseous abnormalities are seen IMPRESSION: 1. Low lung volumes with cardiomegaly. 2. Interstitial coarsening which appears chronic, and may be secondary to underlying interstitial lung disease. 3. Slight ill-defined subpleural opacity at the periphery of the left lung base is nonspecific. This may represent atelectasis, more focal interstitial changes, potentially infection in the appropriate clinical setting. Electronically Signed   By: Narda Rutherford M.D.   On: 07/30/2020 11:22   CT CHEST W CONTRAST  Result Date: 08/10/2020 CLINICAL DATA:  Pneumonia EXAM: CT CHEST WITH CONTRAST TECHNIQUE: Multidetector CT imaging of the chest was performed during intravenous contrast administration. CONTRAST:  70mL OMNIPAQUE IOHEXOL 300 MG/ML  SOLN COMPARISON:  Chest radiographs, 08/05/2020, CT chest angiogram, 09/03/2019, 05/10/2017, 03/03/2009 FINDINGS: Cardiovascular: Aortic atherosclerosis. Mild cardiomegaly. Left coronary artery calcifications. No pericardial effusion. Mediastinum/Nodes: Unchanged prominent pretracheal lymph nodes. Thyroid gland, trachea, and esophagus demonstrate no significant findings. Lungs/Pleura: Redemonstrated mild to moderate pulmonary fibrosis with apical to basal gradient featuring irregular peripheral interstitial opacity, interstitial septal thickening, and areas of subpleural bronchiolectasis at the lung bases. No acutely  superimposed airspace disease. No pleural effusion or pneumothorax. Upper Abdomen: No acute abnormality. Musculoskeletal: No chest wall mass or suspicious bone lesions identified. IMPRESSION: 1. Redemonstrated mild to moderate pulmonary fibrosis with apical to basal gradient featuring irregular peripheral interstitial opacity, interstitial septal thickening, and areas of subpleural bronchiolectasis at the lung bases. Findings are much more clearly assessed on this examination when compared to very motion limited exam dated 04/18/2019 and worsened over a long period of time on examinations dating back to 2010. 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 Findings are categorized as probable UIP per consensus guidelines: Diagnosis of Idiopathic Pulmonary Fibrosis: An Official ATS/ERS/JRS/ALAT Clinical Practice Guideline. Am Rosezetta Schlatter Crit Care Med Vol 198, Iss 5, [phone], Jun 17 2017. 2. No acutely superimposed airspace disease. 3. Coronary artery disease.  Aortic Atherosclerosis (ICD10-I70.0). Electronically Signed   By: Erasmo Score.D.  On: 08/10/2020 08:21       Subjective: No new complaints.   Discharge Exam: Vitals:   08/11/20 0838 08/11/20 1103  BP: (!) 107/49 125/90  Pulse: 75 61  Resp: 19 13  Temp: 97.8 F (36.6 C) 97.9 F (36.6 C)  SpO2: 100% 98%   Vitals:   08/11/20 0358 08/11/20 0500 08/11/20 0838 08/11/20 1103  BP: 131/67  (!) 107/49 125/90  Pulse: 69  75 61  Resp: Temp: 98.1 F (36.7 C)  97.8 F (36.6 C) 97.9 F (36.6 C)  TempSrc: Oral  Oral Oral  SpO2: 95%  100% 98%  Weight:  103.3 kg    Height:        General: Pt is alert, awake, not in acute distress Cardiovascular: RRR, S1/S2 +, no rubs, no gallops Respiratory: CTA bilaterally, no wheezing, no rhonchi Abdominal: Soft, NT, ND, bowel sounds + Extremities: no edema, no cyanosis    The results of significant diagnostics from this hospitalization (including  imaging, microbiology, ancillary and laboratory) are listed below for reference.     Microbiology: Recent Results (from the past 240 hour(s))  Respiratory Panel by RT PCR (Flu A&B, Covid) - Nasopharyngeal Swab     Status: None   Collection Time: 08/05/20  9:06 AM   Specimen: Nasopharyngeal Swab  Result Value Ref Range Status   SARS Coronavirus 2 by RT PCR NEGATIVE NEGATIVE Final    Comment: (NOTE) SARS-CoV-2 target nucleic acids are NOT DETECTED.  The SARS-CoV-2 RNA is generally detectable in upper respiratoy specimens during the acute phase of infection. The lowest concentration of SARS-CoV-2 viral copies this assay can detect is 131 copies/mL. A negative result does not preclude SARS-Cov-2 infection and should not be used as the sole basis for treatment or other patient management decisions. A negative result may occur with  improper specimen collection/handling, submission of specimen other than nasopharyngeal swab, presence of viral mutation(s) within the areas targeted by this assay, and inadequate number of viral copies (<131 copies/mL). A negative result must be combined with clinical observations, patient history, and epidemiological information. The expected result is Negative.  Fact Sheet for Patients:  https://www.moore.com/  Fact Sheet for Healthcare Providers:  https://www.young.biz/  This test is no t yet approved or cleared by the [place] FDA and  has been authorized for detection and/or diagnosis of SARS-CoV-2 by FDA under an Emergency Use Authorization (EUA). This EUA will remain  in effect (meaning this test can be used) for the duration of the COVID-19 declaration under Section 564(b)(1) of the Act, 21 U.S.C. section 360bbb-3(b)(1), unless the authorization is terminated or revoked sooner.     Influenza A by PCR NEGATIVE NEGATIVE Final   Influenza B by PCR NEGATIVE NEGATIVE Final    Comment: (NOTE) The Xpert  Xpress SARS-CoV-2/FLU/RSV assay is intended as an aid in  the diagnosis of influenza from Nasopharyngeal swab specimens and  should not be used as a sole basis for treatment. Nasal washings and  aspirates are unacceptable for Xpert Xpress SARS-CoV-2/FLU/RSV  testing.  Fact Sheet for Patients: https://www.moore.com/  Fact Sheet for Healthcare Providers: https://www.young.biz/  This test is not yet approved or cleared by the [place] FDA and  has been authorized for detection and/or diagnosis of SARS-CoV-2 by  FDA under an Emergency Use Authorization (EUA). This EUA will remain  in effect (meaning this test can be used) for the duration of the  Covid-19 declaration under Section 564(b)(1) of the Act, 21  U.S.C. section 360bbb-3(b)(1), unless the authorization is  terminated or revoked. Performed at [facility], [address] [place], [place] [address]      Labs: BNP (last 3 results) Recent Labs    09/03/19 0810  BNP 269.2*   Basic Metabolic Panel: Recent Labs  Lab 08/05/20 0906 08/05/20 1050 08/06/20 0147 08/07/20 8841 08/08/20 0624 08/09/20 0740 08/11/20 0733  NA   < >  --  135 138 135 136 136  K   < >  --  4.9 4.1 4.7 4.6 5.0  CL   < >  --  105 107 104 101 101  CO2   < >  --  21* 21* 23 26 24   GLUCOSE   < >  --  135* 120* 145* 108* 192*  BUN   < >  --  23 12 12 15 18   CREATININE   < >  --  1.48* 1.06* 0.97 0.98 0.91  CALCIUM   < >  --  8.6* 8.6* 8.6* 9.2 9.4  MG  --  1.7  --  1.5* 2.0 1.7 1.8   < > = values in this interval not displayed.   Liver Function Tests: Recent Labs  Lab 08/05/20 0906 08/06/20 0147  AST 16 18  ALT 12 11  ALKPHOS 50 52  BILITOT 0.8 2.7*  PROT 6.6 6.3*  ALBUMIN 3.2* 3.1*   No results for input(s): LIPASE, AMYLASE in the last 168 hours. No results for input(s): AMMONIA in the last 168 hours. CBC: Recent Labs  Lab 08/05/20 0906 08/05/20 0906 08/06/20 0147 08/06/20 1733  08/09/20 0740 08/09/20 1726 08/10/20 0615 08/10/20 1727 08/11/20 0733  WBC 6.0  --  6.0  --   --   --   --   --   --   NEUTROABS 4.1  --   --   --   --   --   --   --   --   HGB 6.5*   < > 8.7*   < > 8.8* 8.2* 7.9* 8.4* 8.4*  HCT 22.1*   < > 27.0*   < > 28.6* 26.4* 25.6* 27.5* 27.2*  MCV 94.0  --  91.2  --   --   --   --   --   --   PLT 305  --  250  --   --   --   --   --   --    < > = values in this interval not displayed.   Cardiac Enzymes: No results for input(s): CKTOTAL, CKMB, CKMBINDEX, TROPONINI in the last 168 hours. BNP: Invalid input(s): POCBNP CBG: Recent Labs  Lab 08/10/20 1110 08/10/20 1621 08/10/20 2112 08/11/20 0622 08/11/20 1100  GLUCAP 127* 180* 259* 126* 149*   D-Dimer No results for input(s): DDIMER in the last 72 hours. Hgb A1c No results for input(s): HGBA1C in the last 72 hours. Lipid Profile No results for input(s): CHOL, HDL, LDLCALC, TRIG, CHOLHDL, LDLDIRECT in the last 72 hours. Thyroid function studies No results for input(s): TSH, T4TOTAL, T3FREE, THYROIDAB in the last 72 hours.  Invalid input(s): FREET3 Anemia work up No results for input(s): VITAMINB12, FOLATE, FERRITIN, TIBC, IRON, RETICCTPCT in the last 72 hours. Urinalysis    Component Value Date/Time   COLORURINE STRAW (A) 08/05/2020 2318   APPEARANCEUR CLEAR 08/05/2020 2318   LABSPEC 1.006 08/05/2020 2318   PHURINE 6.0 08/05/2020 2318   GLUCOSEU NEGATIVE 08/05/2020 2318   HGBUR NEGATIVE 08/05/2020  2318   BILIRUBINUR NEGATIVE 08/05/2020 2318   KETONESUR NEGATIVE 08/05/2020 2318   PROTEINUR NEGATIVE 08/05/2020 2318   NITRITE NEGATIVE 08/05/2020 2318   LEUKOCYTESUR NEGATIVE 08/05/2020 2318   Sepsis Labs Invalid input(s): PROCALCITONIN,  WBC,  LACTICIDVEN Microbiology Recent Results (from the past 240 hour(s))  Respiratory Panel by RT PCR (Flu A&B, Covid) - Nasopharyngeal Swab     Status: None   Collection Time: 08/05/20  9:06 AM   Specimen: Nasopharyngeal Swab  Result  Value Ref Range Status   SARS Coronavirus 2 by RT PCR NEGATIVE NEGATIVE Final    Comment: (NOTE) SARS-CoV-2 target nucleic acids are NOT DETECTED.  The SARS-CoV-2 RNA is generally detectable in upper respiratoy specimens during the acute phase of infection. The lowest concentration of SARS-CoV-2 viral copies this assay can detect is 131 copies/mL. A negative result does not preclude SARS-Cov-2 infection and should not be used as the sole basis for treatment or other patient management decisions. A negative result may occur with  improper specimen collection/handling, submission of specimen other than nasopharyngeal swab, presence of viral mutation(s) within the areas targeted by this assay, and inadequate number of viral copies (<131 copies/mL). A negative result must be combined with clinical observations, patient history, and epidemiological information. The expected result is Negative.  Fact Sheet for Patients:  https://www.moore.com/  Fact Sheet for Healthcare Providers:  https://www.young.biz/  This test is no t yet approved or cleared by the [place] FDA and  has been authorized for detection and/or diagnosis of SARS-CoV-2 by FDA under an Emergency Use Authorization (EUA). This EUA will remain  in effect (meaning this test can be used) for the duration of the COVID-19 declaration under Section 564(b)(1) of the Act, 21 U.S.C. section 360bbb-3(b)(1), unless the authorization is terminated or revoked sooner.     Influenza A by PCR NEGATIVE NEGATIVE Final   Influenza B by PCR NEGATIVE NEGATIVE Final    Comment: (NOTE) The Xpert Xpress SARS-CoV-2/FLU/RSV assay is intended as an aid in  the diagnosis of influenza from Nasopharyngeal swab specimens and  should not be used as a sole basis for treatment. Nasal washings and  aspirates are unacceptable for Xpert Xpress SARS-CoV-2/FLU/RSV  testing.  Fact Sheet for  Patients: https://www.moore.com/  Fact Sheet for Healthcare Providers: https://www.young.biz/  This test is not yet approved or cleared by the [place] FDA and  has been authorized for detection and/or diagnosis of SARS-CoV-2 by  FDA under an Emergency Use Authorization (EUA). This EUA will remain  in effect (meaning this test can be used) for the duration of the  Covid-19 declaration under Section 564(b)(1) of the Act, 21  U.S.C. section 360bbb-3(b)(1), unless the authorization is  terminated or revoked. Performed at [facility], [address] [place], [place] [address]      Time coordinating discharge: 36 minutes.  SIGNED:   Kathlen Mody, MD  [facility] 08/11/2020, 12:31 PM

## 2020-08-13 DIAGNOSIS — J9611 Chronic respiratory failure with hypoxia: Secondary | ICD-10-CM | POA: Diagnosis not present

## 2020-08-13 DIAGNOSIS — J8410 Pulmonary fibrosis, unspecified: Secondary | ICD-10-CM | POA: Diagnosis not present

## 2020-08-13 DIAGNOSIS — D500 Iron deficiency anemia secondary to blood loss (chronic): Secondary | ICD-10-CM | POA: Diagnosis not present

## 2020-08-13 DIAGNOSIS — K922 Gastrointestinal hemorrhage, unspecified: Secondary | ICD-10-CM | POA: Diagnosis not present

## 2020-08-14 DIAGNOSIS — J84112 Idiopathic pulmonary fibrosis: Secondary | ICD-10-CM | POA: Diagnosis not present

## 2020-08-14 DIAGNOSIS — I5043 Acute on chronic combined systolic (congestive) and diastolic (congestive) heart failure: Secondary | ICD-10-CM | POA: Diagnosis not present

## 2020-08-18 ENCOUNTER — Other Ambulatory Visit: Payer: Self-pay | Admitting: Family

## 2020-08-18 DIAGNOSIS — H40013 Open angle with borderline findings, low risk, bilateral: Secondary | ICD-10-CM | POA: Diagnosis not present

## 2020-08-18 DIAGNOSIS — H2589 Other age-related cataract: Secondary | ICD-10-CM | POA: Diagnosis not present

## 2020-08-18 DIAGNOSIS — D649 Anemia, unspecified: Secondary | ICD-10-CM

## 2020-08-18 DIAGNOSIS — E119 Type 2 diabetes mellitus without complications: Secondary | ICD-10-CM | POA: Diagnosis not present

## 2020-08-18 DIAGNOSIS — Z961 Presence of intraocular lens: Secondary | ICD-10-CM | POA: Diagnosis not present

## 2020-08-19 ENCOUNTER — Inpatient Hospital Stay: Payer: Medicare Other

## 2020-08-19 ENCOUNTER — Inpatient Hospital Stay: Payer: Medicare Other | Attending: Family | Admitting: Family

## 2020-08-19 ENCOUNTER — Other Ambulatory Visit: Payer: Self-pay

## 2020-08-19 ENCOUNTER — Telehealth: Payer: Self-pay | Admitting: Family

## 2020-08-19 ENCOUNTER — Telehealth: Payer: Self-pay

## 2020-08-19 ENCOUNTER — Encounter: Payer: Self-pay | Admitting: Family

## 2020-08-19 VITALS — BP 130/47 | HR 63 | Temp 97.6°F | Resp 18 | Ht 60.0 in | Wt 229.1 lb

## 2020-08-19 DIAGNOSIS — I4891 Unspecified atrial fibrillation: Secondary | ICD-10-CM | POA: Diagnosis not present

## 2020-08-19 DIAGNOSIS — Z7984 Long term (current) use of oral hypoglycemic drugs: Secondary | ICD-10-CM | POA: Diagnosis not present

## 2020-08-19 DIAGNOSIS — I10 Essential (primary) hypertension: Secondary | ICD-10-CM | POA: Insufficient documentation

## 2020-08-19 DIAGNOSIS — D500 Iron deficiency anemia secondary to blood loss (chronic): Secondary | ICD-10-CM | POA: Insufficient documentation

## 2020-08-19 DIAGNOSIS — Z803 Family history of malignant neoplasm of breast: Secondary | ICD-10-CM | POA: Insufficient documentation

## 2020-08-19 DIAGNOSIS — Z79899 Other long term (current) drug therapy: Secondary | ICD-10-CM | POA: Diagnosis not present

## 2020-08-19 DIAGNOSIS — Z7901 Long term (current) use of anticoagulants: Secondary | ICD-10-CM | POA: Diagnosis not present

## 2020-08-19 DIAGNOSIS — K922 Gastrointestinal hemorrhage, unspecified: Secondary | ICD-10-CM | POA: Insufficient documentation

## 2020-08-19 DIAGNOSIS — D649 Anemia, unspecified: Secondary | ICD-10-CM

## 2020-08-19 DIAGNOSIS — E119 Type 2 diabetes mellitus without complications: Secondary | ICD-10-CM | POA: Diagnosis not present

## 2020-08-19 DIAGNOSIS — Z8249 Family history of ischemic heart disease and other diseases of the circulatory system: Secondary | ICD-10-CM | POA: Insufficient documentation

## 2020-08-19 DIAGNOSIS — E785 Hyperlipidemia, unspecified: Secondary | ICD-10-CM | POA: Insufficient documentation

## 2020-08-19 LAB — CMP (CANCER CENTER ONLY)
ALT: 10 U/L (ref 0–44)
AST: 10 U/L — ABNORMAL LOW (ref 15–41)
Albumin: 3.9 g/dL (ref 3.5–5.0)
Alkaline Phosphatase: 55 U/L (ref 38–126)
Anion gap: 8 (ref 5–15)
BUN: 31 mg/dL — ABNORMAL HIGH (ref 8–23)
CO2: 29 mmol/L (ref 22–32)
Calcium: 9.8 mg/dL (ref 8.9–10.3)
Chloride: 98 mmol/L (ref 98–111)
Creatinine: 0.97 mg/dL (ref 0.44–1.00)
GFR, Estimated: >60 mL/min (ref >60)
Glucose, Bld: 262 mg/dL — ABNORMAL HIGH (ref 70–99)
Potassium: 5.5 mmol/L — ABNORMAL HIGH (ref 3.5–5.1)
Sodium: 135 mmol/L (ref 135–145)
Total Bilirubin: 0.8 mg/dL (ref 0.3–1.2)
Total Protein: 6.8 g/dL (ref 6.5–8.1)

## 2020-08-19 LAB — CBC WITH DIFFERENTIAL (CANCER CENTER ONLY)
Abs Immature Granulocytes: 0.07 10*3/uL (ref 0.00–0.07)
Basophils Absolute: 0 10*3/uL (ref 0.0–0.1)
Basophils Relative: 0 %
Eosinophils Absolute: 0 10*3/uL (ref 0.0–0.5)
Eosinophils Relative: 0 %
HCT: 34 % — ABNORMAL LOW (ref 36.0–46.0)
Hemoglobin: 10.4 g/dL — ABNORMAL LOW (ref 12.0–15.0)
Immature Granulocytes: 1 %
Lymphocytes Relative: 10 %
Lymphs Abs: 0.8 10*3/uL (ref 0.7–4.0)
MCH: 27.7 pg (ref 26.0–34.0)
MCHC: 30.6 g/dL (ref 30.0–36.0)
MCV: 90.7 fL (ref 80.0–100.0)
Monocytes Absolute: 0.2 10*3/uL (ref 0.1–1.0)
Monocytes Relative: 3 %
Neutro Abs: 6.7 10*3/uL (ref 1.7–7.7)
Neutrophils Relative %: 86 %
Platelet Count: 423 10*3/uL — ABNORMAL HIGH (ref 150–400)
RBC: 3.75 MIL/uL — ABNORMAL LOW (ref 3.87–5.11)
RDW: 15 % (ref 11.5–15.5)
WBC Count: 7.8 10*3/uL (ref 4.0–10.5)
nRBC: 0 % (ref 0.0–0.2)

## 2020-08-19 LAB — IRON AND TIBC
Iron: 74 ug/dL (ref 41–142)
Saturation Ratios: 18 % — ABNORMAL LOW (ref 21–57)
TIBC: 415 ug/dL (ref 236–444)
UIBC: 342 ug/dL (ref 120–384)

## 2020-08-19 LAB — RETICULOCYTES
Immature Retic Fract: 17.5 % — ABNORMAL HIGH (ref 2.3–15.9)
RBC.: 3.5 MIL/uL — ABNORMAL LOW (ref 3.87–5.11)
Retic Count, Absolute: 97.5 10*3/uL (ref 19.0–186.0)
Retic Ct Pct: 2.8 % (ref 0.4–3.1)

## 2020-08-19 LAB — SAVE SMEAR(SSMR), FOR PROVIDER SLIDE REVIEW

## 2020-08-19 LAB — FERRITIN: Ferritin: 31 ng/mL (ref 11–307)

## 2020-08-19 LAB — LACTATE DEHYDROGENASE: LDH: 194 U/L — ABNORMAL HIGH (ref 98–192)

## 2020-08-19 NOTE — Progress Notes (Signed)
Patient stable at discharge with no concerns.

## 2020-08-19 NOTE — Telephone Encounter (Signed)
Appointments scheduled calendar printed per 1/13 los 

## 2020-08-19 NOTE — Progress Notes (Signed)
Hematology/Oncology Consultation   Name: Carrie Haynes      MRN: [id number]    Location: Room/bed info not found  Date: 08/19/2020 Time:9:02 AM   REFERRING PHYSICIAN: Kathlen Mody, MD  REASON FOR CONSULT: Symptomatic anemia    DIAGNOSIS: Iron deficiency anemia secondary to GI blood loss  HISTORY OF PRESENT ILLNESS: Carrie Haynes is a very pleasant 79 yo caucasian female with anemia secondary to GI blood loss while on anticoagulation.  Carrie Haynes was hospitalized for several days in October for anemia. Endosocpy showed mild duodenitis but was otherwise normal. Capsule endoscopy was normal.  Colonoscopy revealed internal and external hemorrhoids.  Carrie Haynes received 2 units of blood during admission and started Omeprazole.  Carrie Haynes has been on Xarelto for history of paroxysmal artrial fib s/p DCCV in 2020.  Carrie Haynes started her oral iron supplement within the last week and so far seems to be tolerating well. No GI upset or constipation.  Hgb is now 10.4, MCV 90, platelets 423! 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 Carrie Haynes is doing well on 3L supplement O2 24 hours a day.  Carrie Haynes has not noted any dark tarry stools or obvious blood loss. No abnormal bruising, no petechiae.  Carrie Haynes states that as a teenage Carrie Haynes had very heavy, painful cycles and went on a medication that was not birth control to help regulate.  Carrie Haynes has 1 daughter, no history of miscarriage. Female organs are still in place.   Carrie Haynes states that Carrie Haynes had 2 brothers that had issues with bleeding but their lab work-ups never revealed any hematologic issues.  No fever, chills, n/v, cough, rash, dizziness, chest pain, palpitations, abdominal pain or changes in bowel or bladder habits.  No swelling or tenderness in her extremities.  Carrie Haynes has occasional numbness and tingling in her feet.  No falls or syncopal episodes to report.  Carrie Haynes has never smoked. Carrie Haynes rarely has an alcoholic beverage socially.  No recreational drug use.  Carrie Haynes has maintained a good appetite and is staying well hydrated. Her weight is stable.   ROS: All other 10 point review of systems is negative.   PAST MEDICAL HISTORY:   Past Medical History:  Diagnosis Date  . A-fib (HCC)   . Diabetes mellitus without complication (HCC)   . Hyperlipemia   . Hypertension     ALLERGIES: Allergies  Allergen Reactions  . Calcium Channel Blockers Other (See Comments)    Acute hypotension, patient became brady/asystole < 5 seconds      MEDICATIONS:  Current Outpatient Medications on File Prior to Visit  Medication Sig Dispense Refill  . cetirizine (ZYRTEC) 10 MG tablet Take 10 mg by mouth daily as needed for allergies.    . Cholecalciferol (VITAMIN D-3) 5000 units TABS Take 5,000 Units by mouth daily.    Marland Kitchen dofetilide (TIKOSYN) 500 MCG capsule TAKE 1 CAPSULE BY MOUTH TWICE A DAY (Patient taking differently: Take 500 mcg by mouth 2 (two) times daily. ) 180 capsule 2  . FREESTYLE PRECISION NEO TEST test strip USE AS DIRECTED TO TEST BLOOD SUGAR DAILY E11.21    . losartan (COZAAR) 50 MG tablet Take 50 mg by mouth daily.    . Magnesium Oxide 400 MG CAPS Take 1 capsule (400 mg total) by mouth 2 (two) times daily. (Patient taking differently: Take 800 mg by mouth daily. ) 180 capsule 3  . metFORMIN (GLUCOPHAGE-XR) 500 MG 24 hr tablet Take 1,500 mg by  mouth at bedtime.     . metoprolol succinate (TOPROL-XL) 50 MG 24 hr tablet Take 1 tablet (50 mg total) by mouth daily. Take with or immediately following a meal. 30 tablet 5  . omeprazole (PRILOSEC) 40 MG capsule Take 40 mg by mouth every morning.    Carlena Hurl 20 MG TABS tablet TAKE 1 TABLET (20 MG TOTAL) BY MOUTH DAILY WITH SUPPER. (Patient taking differently: Take 20 mg by mouth daily with supper. ) 90 tablet 1  . atorvastatin (LIPITOR) 20 MG tablet Take 20 mg by mouth daily.    . ferrous sulfate 325 (65 FE) MG tablet Take 325 mg by mouth daily.     No current facility-administered  medications on file prior to visit.     PAST SURGICAL HISTORY Past Surgical History:  Procedure Laterality Date  . CARDIOVERSION N/A 05/18/2017   Procedure: CARDIOVERSION;  Surgeon: Wendall Stade, MD;  Location: [facility] ENDOSCOPY;  Service: Cardiovascular;  Laterality: N/A;  . CARDIOVERSION N/A 05/23/2017   Procedure: CARDIOVERSION;  Surgeon: Quintella Reichert, MD;  Location: [facility] ENDOSCOPY;  Service: Cardiovascular;  Laterality: N/A;  . CARDIOVERSION N/A 08/28/2019   Procedure: CARDIOVERSION;  Surgeon: Jake Bathe, MD;  Location: [facility] ENDOSCOPY;  Service: Cardiovascular;  Laterality: N/A;  . COLONOSCOPY N/A 08/07/2020   Procedure: COLONOSCOPY;  Surgeon: Willis Modena, MD;  Location: [facility] ENDOSCOPY;  Service: Endoscopy;  Laterality: N/A;  . ESOPHAGOGASTRODUODENOSCOPY N/A 08/07/2020   Procedure: ESOPHAGOGASTRODUODENOSCOPY (EGD);  Surgeon: Willis Modena, MD;  Location: [facility] ENDOSCOPY;  Service: Endoscopy;  Laterality: N/A;  . GIVENS CAPSULE STUDY N/A 08/09/2020   Procedure: GIVENS CAPSULE STUDY;  Surgeon: Willis Modena, MD;  Location: [facility] ENDOSCOPY;  Service: Endoscopy;  Laterality: N/A;  . RIGHT/LEFT HEART CATH AND CORONARY ANGIOGRAPHY N/A 05/12/2017   Procedure: Right/Left Heart Cath and Coronary Angiography;  Surgeon: Lyn Records, MD;  Location: [facility] INVASIVE CV LAB;  Service: Cardiovascular;  Laterality: N/A;  . TEE WITHOUT CARDIOVERSION N/A 05/18/2017   Procedure: TRANSESOPHAGEAL ECHOCARDIOGRAM (TEE);  Surgeon: Wendall Stade, MD;  Location: [facility] ENDOSCOPY;  Service: Cardiovascular;  Laterality: N/A;    FAMILY HISTORY: Family History  Problem Relation Age of Onset  . Hypertension Father   . Heart attack Sister   . Breast cancer Other     SOCIAL HISTORY:  reports that Carrie Haynes has never smoked. Carrie Haynes has never used smokeless tobacco. Carrie Haynes reports that Carrie Haynes does not drink alcohol and does not use drugs.  PERFORMANCE STATUS: The patient's performance status is 1 - Symptomatic but completely  ambulatory  PHYSICAL EXAM: Most Recent Vital Signs: Blood pressure (!) 130/47, pulse 63, temperature 97.6 F (36.4 C), temperature source Oral, resp. rate 18, height 5' (1.524 m), weight 229 lb 1.3 oz (103.9 kg), SpO2 96 %, peak flow (!) 3 L/min. BP (!) 130/47 (BP Location: Right Arm, Patient Position: Sitting)   Pulse 63   Temp 97.6 F (36.4 C) (Oral)   Resp 18   Ht 5' (1.524 m)   Wt 229 lb 1.3 oz (103.9 kg)   SpO2 96%   PF (!) 3 L/min   BMI 44.74 kg/m   General Appearance:    Alert, cooperative, no distress, appears stated age  Head:    Normocephalic, without obvious abnormality, atraumatic  Eyes:    PERRL, conjunctiva/corneas clear, EOM's intact, fundi    benign, both eyes        Throat:   Lips, mucosa, and tongue normal; teeth and gums normal  Neck:   Supple,  symmetrical, trachea midline, no adenopathy;    thyroid:  no enlargement/tenderness/nodules; no carotid   bruit or JVD  Back:     Symmetric, no curvature, ROM normal, no CVA tenderness  Lungs:     Clear to auscultation bilaterally, respirations unlabored  Chest Wall:    No tenderness or deformity   Heart:    Regular rate and rhythm, S1 and S2 normal, no murmur, rub   or gallop     Abdomen:     Soft, non-tender, bowel sounds active all four quadrants,    no masses, no organomegaly        Extremities:   Extremities normal, atraumatic, no cyanosis or edema  Pulses:   2+ and symmetric all extremities  Skin:   Skin color, texture, turgor normal, no rashes or lesions  Lymph nodes:   Cervical, supraclavicular, and axillary nodes normal  Neurologic:   CNII-XII intact, normal strength, sensation and reflexes    throughout    LABORATORY DATA:  Results for orders placed or performed in visit on 08/19/20 (from the past 48 hour(s))  Save Smear (SSMR)     Status: None   Collection Time: 08/19/20  8:20 AM  Result Value Ref Range   Smear Review SMEAR STAINED AND AVAILABLE FOR REVIEW     Comment: Performed at [facility] at [facility], [address], [place], [place] [address]  CMP (Cancer Center only)     Status: Abnormal   Collection Time: 08/19/20  8:20 AM  Result Value Ref Range   Sodium 135 135 - 145 mmol/L   Potassium 5.5 (H) 3.5 - 5.1 mmol/L   Chloride 98 98 - 111 mmol/L   CO2 29 22 - 32 mmol/L   Glucose, Bld 262 (H) 70 - 99 mg/dL    Comment: Glucose reference range applies only to samples taken after fasting for at least 8 hours.   BUN 31 (H) 8 - 23 mg/dL   Creatinine 3.32 9.51 - 1.00 mg/dL   Calcium 9.8 8.9 - 88.4 mg/dL   Total Protein 6.8 6.5 - 8.1 g/dL   Albumin 3.9 3.5 - 5.0 g/dL   AST 10 (L) 15 - 41 U/L   ALT 10 0 - 44 U/L   Alkaline Phosphatase 55 38 - 126 U/L   Total Bilirubin 0.8 0.3 - 1.2 mg/dL   GFR, Estimated >16 >60 mL/min    Comment: (NOTE) Calculated using the CKD-EPI Creatinine Equation (2021)    Anion gap 8 5 - 15    Comment: Performed at [facility], [address], [place], [place] [address]  CBC with Differential (Cancer Center Only)     Status: Abnormal   Collection Time: 08/19/20  8:20 AM  Result Value Ref Range   WBC Count 7.8 4.0 - 10.5 K/uL   RBC 3.75 (L) 3.87 - 5.11 MIL/uL   Hemoglobin 10.4 (L) 12.0 - 15.0 g/dL   HCT 01.0 (L) 36 - 46 %   MCV 90.7 80.0 - 100.0 fL   MCH 27.7 26.0 - 34.0 pg   MCHC 30.6 30.0 - 36.0 g/dL   RDW 93.2 35.5 - 73.2 %   Platelet Count 423 (H) 150 - 400 K/uL   nRBC 0.0 0.0 - 0.2 %   Neutrophils Relative % 86 %   Neutro Abs 6.7 1.7 - 7.7 K/uL   Lymphocytes Relative 10 %   Lymphs Abs 0.8 0.7 - 4.0 K/uL   Monocytes Relative  3 %   Monocytes Absolute 0.2 0.1 - 1.0 K/uL   Eosinophils Relative 0 %   Eosinophils Absolute 0.0 0.0 - 0.5 K/uL   Basophils Relative 0 %   Basophils Absolute 0.0 0.0 - 0.1 K/uL   Immature Granulocytes 1 %   Abs Immature Granulocytes 0.07 0.00 - 0.07 K/uL    Comment: Performed at [facility] at [facility], [address],  [place], [place] [address]  Reticulocytes     Status: Abnormal   Collection Time: 08/19/20  8:21 AM  Result Value Ref Range   Retic Ct Pct 2.8 0.4 - 3.1 %   RBC. 3.50 (L) 3.87 - 5.11 MIL/uL   Retic Count, Absolute 97.5 19.0 - 186.0 K/uL   Immature Retic Fract 17.5 (H) 2.3 - 15.9 %    Comment: Performed at [facility] at [facility], [address], [place], [place] [address]      RADIOGRAPHY: No results found.     PATHOLOGY: None  ASSESSMENT/PLAN: Carrie Haynes is a very pleasant 79 yo caucasian female with anemia secondary to GI blood loss while on anticoagulation.  Carrie Haynes is currently on an oral iron supplement and would prefer this to IV iron but is willing to do infusions if counts remain low.  Iron studies and erythropoietin level pending. We will replace if needed.  Follow-up in 3 months.   All questions were answered and Carrie Haynes is in agreement with the plan. Carrie Haynes can contact our office with any questions or concerns. We can certainly see her sooner if needed.    Emeline Gins, NP

## 2020-08-19 NOTE — Telephone Encounter (Signed)
Attached message given to pt via phone per Maralyn Sago, NP:  "Can continue your oral iron supplement! It is working! WOO HOO!!!!"  Pt verbalizes understanding and appreciation. dph

## 2020-08-20 LAB — ERYTHROPOIETIN: Erythropoietin: 12.8 m[IU]/mL (ref 2.6–18.5)

## 2020-09-08 ENCOUNTER — Encounter: Payer: Self-pay | Admitting: Internal Medicine

## 2020-09-08 ENCOUNTER — Other Ambulatory Visit: Payer: Self-pay

## 2020-09-08 ENCOUNTER — Ambulatory Visit: Payer: Medicare Other | Admitting: Internal Medicine

## 2020-09-08 DIAGNOSIS — R0600 Dyspnea, unspecified: Secondary | ICD-10-CM

## 2020-09-08 DIAGNOSIS — J9611 Chronic respiratory failure with hypoxia: Secondary | ICD-10-CM

## 2020-09-08 DIAGNOSIS — R0609 Other forms of dyspnea: Secondary | ICD-10-CM

## 2020-09-08 NOTE — Patient Instructions (Addendum)
Make sure you check your oxygen saturations at highest level of activity to be sure it stays over 90% and adjust  02 flow upward to maintain this level if needed but remember to turn it back to previous settings when you stop (to conserve your supply).   Weight control is simply a matter of calorie balance which needs to be tilted in your favor by eating less and exercising more.  To get the most out of exercise, you need to be continuously aware that you are short of breath, but never out of breath, for 30 minutes daily. As you improve, it will actually be easier for you to do the same amount of exercise  in  30 minutes so always push to the level where you are short of breath but not out of breath and keep sats > 90%    Please schedule a follow up visit in 3 months but call sooner if needed

## 2020-09-08 NOTE — Progress Notes (Signed)
Subjective:     Patient ID: Carrie Haynes, female   DOB: 10/15/1941      MRN: [id number]     History of Present Illness  41 yowf never smoker with remote dx of AB but did not stay on inhalers and breathing was fine x ? Years/? Decades but since early 2018 noted doe > admitted  Admit date: 05/10/2017 Discharge date: 05/23/2017   Primary Care Provider: Laurann Montana Primary Cardiologist: Dr. Royann Shivers (per patient)  Discharge Diagnoses    Principal Problem:   Atrial fibrillation [facility]) Active Problems:   Dyspnea   Acute on chronic combined systolic and diastolic CHF (congestive heart failure) (HCC)   Asystole (HCC)   Hypotension   Atrial fibrillation with RVR (HCC)   DCM (dilated cardiomyopathy) (HCC)   Allergies      Allergies  Allergen Reactions  . Calcium Channel Blockers Other (See Comments)    Acute hypotension, patient became brady/asystole < 5 seconds     History of Present Illness     Carrie Haynes is a 79 yo female with PMH of NIDDM, HTN, and HL. Reports she is followed by her PCP for chronic issues. Family hx of CAD with half sister having an MI.  Reports she has felt short of breath over the past 6 months, but recently worsened. States 2 nights ago she developed worsening dyspnea, and orthopnea.Complains of SSCPFelt like a stabbing sensation in her chest with deep inspiration Also in between shoulder blades Again, pleuritic.  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 Was on coumadin afterwards, and then taken off. States she had another PE afterwards, and on coumadin again. This was many years ago per her report. Pt has also had some episodes of dizziness over the past couple of weeks, mostly with walking. Flet like she had to hold herself up with wall Also reports some palpitations prior to admission.   Presented to the ED with worsening dyspnea. She was started on IV heparin given concern for  possible PE. CTA was negative for PE but showed evidence to support pulmonary hypertension. Labs showed stable electrolytes, BNP 115, Hgb 10.6, I stat Trop 0.01, Mag 1.5. CXR negative for edema. EKG showed new onset Afib RVR. Placed on IV dilt and rate improved from 140s to 100s. Given 2g IV mag in the ED.    Hospital Course     Consultants: None  She underwent right and left heart catheterization on 05/12/17 with normal coronaries and with elevated filling pressures consistent with acute on chronic combined systolic and diastolic heart failure. Left ventricular systolic dysfunction was thought to be secondary to tachycardia. She was placed on PO amiodarone and underwent TEE/DCCV on 05/18/17. The cardioversion was unsuccessful. She was continued on anticoagulation (xarelto) and started on IV amiodarone with plans for repeat DCCV. Unfortunately, IV amiodarone infiltrated with bilateral thrombophlebitis in both arms, no skin necrosis. Second DCCV without TEE was performed on 05/23/17 with conversion to sinus rhythm. EKG confirms sinus rhythm.   Will continue xarelto and PO amiodarone.   Borderline BP precludes initiation of conventional heart failure medications. Will repeat echo in three months after established NSR to evaluate function. Reduced LV function may be related to Afib and tachycardia.       07/12/2017 1st [facility] office visit/ Marrianne Sica  Re doe ? Amiodarone related  Chief Complaint  Patient presents with  . Advice Only    Referred  by Dr. Royann Shivers. Pt had an abnormal PFT 05/19/17. Pt admitted to hospital 7/25-8/7 due to atrial fibrillation and was unable to breathe. Pt currently complains of occ. cough and SOB on exertion.  doe x MMRC2 = can't walk a nl pace on a flat grade s sob but does fine slow and flat eg shopping / leaning on cart  When last had nl activity tol wt =  250 and despite wt loss has lost ground with doe over same time frame = about 8 months and much better  since admit but not back to prev baseline Also cough x years sporadic and dry and notes at hs and in am > rest of the day  but no worse on amio Sleeps ok on one pillow  rec Add pepcid 20 mg one hour before bed GERD   Monitor your oxygen level with regular paced walking and let me and Dr C know right away if trending down over several days or weeks (one or two days is not a true "trend" so keep monitoring as regularly as you can)     D/C amiodarone around 07/17/17   10/23/2017  f/u ov/[name] re: doe  @ 221  Chief Complaint  Patient presents with  . Follow-up    PFT's done today. She states her breathing is back to her normal baseline and she is no longer coughing. No new co's.   sleeps horizontal R side down  Doe improved/ not tracking sats though and vague about what activities she actually attempts to do at this point rec Keep up the walking and monitor your  0xygen saturations and call for follow up appt if you see a downward trend from this point walking at a slower pace than what you did today. Pulmonary follow up can be as needed    07/28/2020  f/u ov/[name] re: steady downhill with breathing and coughing x 3 year Chief Complaint  Patient presents with  . Follow-up    DOE  Dyspnea:  Maybe an aisle or two at [employer] slowly = MMRC3 = can't walk 100 yards even at a slow pace at a flat grade s stopping due to sob  Cough: all day and when lie down then goes away p 5-10 min  and doesn't wake her  Sleeping: ok on R side flat bed one pillow  SABA use: none 02: none occ brb per rectum  Streaky  on stools while maint  on eliquis rec Check your medications when you get home to be sure they correlate with AVS and call with any discrepancies  Zyrtec 10 mg  One hour before bedtime (over the counter)  Omeprazole/Prilosec  should be 40 mg Take 30- 60 min before your first and last meals of the day  GERD diet  Please remember to go to the lab and x-ray department   for your tests - we will  call you with the results when they are available.   Please schedule a follow up office visit in 4 weeks, sooner if needed  Late add :  normocyctic anemia/ streaky brbpr on xarelto will need urgent GI eval and if worsens stop xarelto or go to ER     09/08/2020  f/u ov/Rembert Browe re:  Worse breathing x 3 y   - anemia at last w/u with desats p 250 ft so now on continuous 02 but not checking sats  Chief Complaint  Patient presents with  . Follow-up    Breathing is much improved since  the last visit. She was hospitalized with anemia 08/05/20-08/11/20 and sent home on o2 3lpm 24/7.   Dyspnea:  Extremely sedentary  Cough: resolved to her satisfaction  Sleeping: flat bed/ one pillow  SABA use: none 02: 3lpm sleep,  3lpm at rest,  3lpm pulsed with ex but not checking pulse yet    No obvious day to day or daytime variability or assoc excess/ purulent sputum or mucus plugs or hemoptysis or cp or chest tightness, subjective wheeze or overt sinus or hb symptoms.   Sleeping as above without nocturnal  or early am exacerbation  of respiratory  c/o's or need for noct saba. Also denies any obvious fluctuation of symptoms with weather or environmental changes or other aggravating or alleviating factors except as outlined above   No unusual exposure hx or h/o childhood pna/ asthma or knowledge of premature birth.  Current Allergies, Complete Past Medical History, Past Surgical History, Family History, and Social History were reviewed in [facility] record.  ROS  The following are not active complaints unless bolded Hoarseness, sore throat, dysphagia, dental problems, itching, sneezing,  nasal congestion or discharge of excess mucus or purulent secretions, ear ache,   fever, chills, sweats, unintended wt loss or wt gain, classically pleuritic or exertional cp,  orthopnea pnd or arm/hand swelling  or leg swelling, presyncope, palpitations, abdominal pain, anorexia, nausea, vomiting, diarrhea   or change in bowel habits or change in bladder habits, change in stools or change in urine, dysuria, hematuria,  rash, arthralgias, visual complaints, headache, numbness, weakness or ataxia or problems with walking or coordination,  change in mood or  memory.        Current Meds  Medication Sig  . atorvastatin (LIPITOR) 20 MG tablet Take 20 mg by mouth daily.  . cetirizine (ZYRTEC) 10 MG tablet Take 10 mg by mouth daily as needed for allergies.  . Cholecalciferol (VITAMIN D-3) 5000 units TABS Take 5,000 Units by mouth daily.  Marland Kitchen dofetilide (TIKOSYN) 500 MCG capsule TAKE 1 CAPSULE BY MOUTH TWICE A DAY (Patient taking differently: Take 500 mcg by mouth 2 (two) times daily. )  . ferrous sulfate 325 (65 FE) MG tablet Take 325 mg by mouth daily.  Marland Kitchen FREESTYLE PRECISION NEO TEST test strip USE AS DIRECTED TO TEST BLOOD SUGAR DAILY E11.21  . losartan (COZAAR) 50 MG tablet Take 50 mg by mouth daily.  . Magnesium Oxide 400 MG CAPS Take 1 capsule (400 mg total) by mouth 2 (two) times daily. (Patient taking differently: Take 800 mg by mouth daily. )  . metFORMIN (GLUCOPHAGE-XR) 500 MG 24 hr tablet Take 1,500 mg by mouth at bedtime.   . metoprolol succinate (TOPROL-XL) 50 MG 24 hr tablet Take 1 tablet (50 mg total) by mouth daily. Take with or immediately following a meal.  . omeprazole (PRILOSEC) 40 MG capsule Take 40 mg by mouth every morning.  Carlena Hurl 20 MG TABS tablet TAKE 1 TABLET (20 MG TOTAL) BY MOUTH DAILY WITH SUPPER. (Patient taking differently: Take 20 mg by mouth daily with supper. )                      Objective:   Physical Exam    09/08/2020     228  07/28/2020     229  10/23/2017         221   07/12/17 213 lb 9.6 oz (96.9 kg)  05/31/17 215 lb 9.6 oz (97.8 kg)  05/23/17 220  lb (99.8 kg)      Vital signs reviewed  09/08/2020  - Note at rest 02 sats  94% on 3lpm   Obese wf nad    HEENT : pt wearing mask not removed for exam due to covid -19 concerns.    NECK :  without  JVD/Nodes/TM/ nl carotid upstrokes bilaterally   LUNGS: no acc muscle use,  Nl contour chest which is clear to A and P bilaterally without cough on insp or exp maneuvers   CV:  RRR  no s3 or murmur or increase in P2, and no edema   ABD:  soft and nontender with nl inspiratory excursion in the supine position. No bruits or organomegaly appreciated, bowel sounds nl  MS:  Nl gait/ ext warm without deformities, calf tenderness, cyanosis or clubbing No obvious joint restrictions   SKIN: warm and dry without lesions    NEURO:  alert, approp, nl sensorium with  no motor or cerebellar deficits apparent.                             Assessment:

## 2020-09-09 ENCOUNTER — Encounter: Payer: Self-pay | Admitting: Internal Medicine

## 2020-09-09 DIAGNOSIS — J9611 Chronic respiratory failure with hypoxia: Secondary | ICD-10-CM | POA: Insufficient documentation

## 2020-09-09 NOTE — Assessment & Plan Note (Signed)
Onset 2018 -PFT's  05/22/2017  FVC 1.54 (64%) no obst s rx prior to study with DLCO  49/56 % corrects to 106  % for alv volume   - Amiodarone rx 05/18/17 > 07/17/17  - 07/12/2017  Walked RA x 3 laps @ 185 ft each stopped due to  End of study, fast pace, desat to 88 but held steady there and only mild sob  - ESR 07/12/2017  = 46   PFT's  10/23/2017  FVC 1.80 (75%) no obst with dlco 49/51c and  DLCO 83% for alv vol  - 10/23/2017  Walked RA x 3 laps @ 185 ft each stopped due to  End of study, fast pace,  89% after first and 86% at very end  HRCT chest  04/18/2019 probable mild to moderate pulmonary fibrosis that is not well characterized given limitations of the exam but 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   To do this, needs to be much more active with enough  02 to keep sats > 90% at all times.

## 2020-09-09 NOTE — Assessment & Plan Note (Signed)
Complicated by dm/hbp/ hyperlidemia  Body mass index is 44.53 kg/m.  -  Overall trend is up  Lab Results  Component Value Date   TSH 1.77 07/28/2020     Contributing to gerd risk/ doe/reviewed the need and the process to achieve and maintain neg calorie balance > defer f/u primary care including intermittently monitoring thyroid status           Each maintenance medication was reviewed in detail including emphasizing most importantly the difference between maintenance and prns and under what circumstances the prns are to be triggered using an action plan format where appropriate.  Total time for H and P, chart review, counseling,  and generating customized AVS unique to this office visit / charting = 20 min

## 2020-09-09 NOTE — Assessment & Plan Note (Signed)
Placed on 02 07/2020   Adequate control on present rx, reviewed in detail with pt > no change in rx needed  For now but advised:  Make sure you check your oxygen saturations at highest level of activity to be sure it stays over 90% and adjust  02 flow upward to maintain this level if needed but remember to turn it back to previous settings when you stop (to conserve your supply).    F/u q 3 months, sooner if needed

## 2020-09-14 DIAGNOSIS — I5043 Acute on chronic combined systolic (congestive) and diastolic (congestive) heart failure: Secondary | ICD-10-CM | POA: Diagnosis not present

## 2020-09-14 DIAGNOSIS — J84112 Idiopathic pulmonary fibrosis: Secondary | ICD-10-CM | POA: Diagnosis not present

## 2020-10-14 DIAGNOSIS — I5043 Acute on chronic combined systolic (congestive) and diastolic (congestive) heart failure: Secondary | ICD-10-CM | POA: Diagnosis not present

## 2020-10-14 DIAGNOSIS — J84112 Idiopathic pulmonary fibrosis: Secondary | ICD-10-CM | POA: Diagnosis not present

## 2020-11-11 ENCOUNTER — Other Ambulatory Visit: Payer: Self-pay

## 2020-11-11 ENCOUNTER — Inpatient Hospital Stay: Payer: Medicare Other | Attending: Family

## 2020-11-11 ENCOUNTER — Encounter: Payer: Self-pay | Admitting: Family

## 2020-11-11 ENCOUNTER — Inpatient Hospital Stay (HOSPITAL_BASED_OUTPATIENT_CLINIC_OR_DEPARTMENT_OTHER): Payer: Medicare Other | Admitting: Family

## 2020-11-11 VITALS — BP 102/49 | HR 59 | Temp 98.3°F | Resp 20 | Ht 60.0 in | Wt 225.8 lb

## 2020-11-11 DIAGNOSIS — Z79899 Other long term (current) drug therapy: Secondary | ICD-10-CM | POA: Insufficient documentation

## 2020-11-11 DIAGNOSIS — D500 Iron deficiency anemia secondary to blood loss (chronic): Secondary | ICD-10-CM

## 2020-11-11 DIAGNOSIS — Z7901 Long term (current) use of anticoagulants: Secondary | ICD-10-CM | POA: Insufficient documentation

## 2020-11-11 DIAGNOSIS — K922 Gastrointestinal hemorrhage, unspecified: Secondary | ICD-10-CM | POA: Diagnosis not present

## 2020-11-11 DIAGNOSIS — Z7984 Long term (current) use of oral hypoglycemic drugs: Secondary | ICD-10-CM | POA: Insufficient documentation

## 2020-11-11 LAB — RETICULOCYTES
Immature Retic Fract: 20.9 % — ABNORMAL HIGH (ref 2.3–15.9)
RBC.: 3.73 MIL/uL — ABNORMAL LOW (ref 3.87–5.11)
Retic Count, Absolute: 98.8 10*3/uL (ref 19.0–186.0)
Retic Ct Pct: 2.7 % (ref 0.4–3.1)

## 2020-11-11 LAB — CBC WITH DIFFERENTIAL (CANCER CENTER ONLY)
Abs Immature Granulocytes: 0.08 10*3/uL — ABNORMAL HIGH (ref 0.00–0.07)
Basophils Absolute: 0 10*3/uL (ref 0.0–0.1)
Basophils Relative: 1 %
Eosinophils Absolute: 0.1 10*3/uL (ref 0.0–0.5)
Eosinophils Relative: 1 %
HCT: 34.3 % — ABNORMAL LOW (ref 36.0–46.0)
Hemoglobin: 10.8 g/dL — ABNORMAL LOW (ref 12.0–15.0)
Immature Granulocytes: 1 %
Lymphocytes Relative: 38 %
Lymphs Abs: 2.8 10*3/uL (ref 0.7–4.0)
MCH: 28.6 pg (ref 26.0–34.0)
MCHC: 31.5 g/dL (ref 30.0–36.0)
MCV: 91 fL (ref 80.0–100.0)
Monocytes Absolute: 0.7 10*3/uL (ref 0.1–1.0)
Monocytes Relative: 9 %
Neutro Abs: 3.8 10*3/uL (ref 1.7–7.7)
Neutrophils Relative %: 50 %
Platelet Count: 276 10*3/uL (ref 150–400)
RBC: 3.77 MIL/uL — ABNORMAL LOW (ref 3.87–5.11)
RDW: 16.5 % — ABNORMAL HIGH (ref 11.5–15.5)
WBC Count: 7.5 10*3/uL (ref 4.0–10.5)
nRBC: 0 % (ref 0.0–0.2)

## 2020-11-11 LAB — CMP (CANCER CENTER ONLY)
ALT: 8 U/L (ref 0–44)
AST: 15 U/L (ref 15–41)
Albumin: 4.1 g/dL (ref 3.5–5.0)
Alkaline Phosphatase: 56 U/L (ref 38–126)
Anion gap: 13 (ref 5–15)
BUN: 25 mg/dL — ABNORMAL HIGH (ref 8–23)
CO2: 24 mmol/L (ref 22–32)
Calcium: 9.7 mg/dL (ref 8.9–10.3)
Chloride: 98 mmol/L (ref 98–111)
Creatinine: 1.1 mg/dL — ABNORMAL HIGH (ref 0.44–1.00)
GFR, Estimated: 52 mL/min — ABNORMAL LOW (ref >60)
Glucose, Bld: 162 mg/dL — ABNORMAL HIGH (ref 70–99)
Potassium: 4.6 mmol/L (ref 3.5–5.1)
Sodium: 135 mmol/L (ref 135–145)
Total Bilirubin: 0.8 mg/dL (ref 0.3–1.2)
Total Protein: 7.3 g/dL (ref 6.5–8.1)

## 2020-11-11 NOTE — Progress Notes (Signed)
Hematology and Oncology Follow Up Visit  Carrie Haynes [id number] February 08, 1941 80 y.o. 11/11/2020   Principle Diagnosis:  Iron deficiency anemia secondary to GI blood loss  Current Therapy:   Oral iron supplement.    Interim History:  Ms. Carrie Haynes is here today with her husband for follow-up. She is tolerating oral iron and taking daily as prescribed.  She has not noted any blood loss. No abnormal bruising, no petechiae.  She is on 3L [place] 24 hours a day with occasional short breaks and states that her SOB and dizziness are stable while on O2.  She has some fatigue at times.  No fever, chills, n/v, cough, rash, chest pain, palpitations, abdominal pain or changes in bowel or bladder habits. No swelling, tenderness, numbness or tingling in her extremities.  No falls or syncope.  She has maintained a good appetite and is staying well hydrated. Her weight is stable at 225 lbs.   ECOG Performance Status: 1 - Symptomatic but completely ambulatory  Medications:  Allergies as of 11/11/2020      Reactions   Calcium Channel Blockers Other (See Comments)   Acute hypotension, patient became brady/asystole < 5 seconds      Medication List       Accurate as of November 11, 2020 11:05 AM. If you have any questions, ask your nurse or doctor.        STOP taking these medications   cetirizine 10 MG tablet Commonly known as: ZYRTEC Stopped by: Emeline Gins, NP     TAKE these medications   atorvastatin 20 MG tablet Commonly known as: LIPITOR Take 20 mg by mouth daily.   dofetilide 500 MCG capsule Commonly known as: TIKOSYN TAKE 1 CAPSULE BY MOUTH TWICE A DAY   ferrous sulfate 325 (65 FE) MG tablet Take 325 mg by mouth daily.   FreeStyle Precision Neo Test test strip Generic drug: glucose blood USE AS DIRECTED TO TEST BLOOD SUGAR DAILY E11.21   losartan 50 MG tablet Commonly known as: COZAAR Take 50 mg by mouth daily.   Magnesium Oxide 400 MG Caps Take 1 capsule (400 mg  total) by mouth 2 (two) times daily. What changed:   how much to take  when to take this   metFORMIN 500 MG 24 hr tablet Commonly known as: GLUCOPHAGE-XR Take 1,500 mg by mouth at bedtime.   metoprolol succinate 50 MG 24 hr tablet Commonly known as: TOPROL-XL Take 1 tablet (50 mg total) by mouth daily. Take with or immediately following a meal.   omeprazole 40 MG capsule Commonly known as: PRILOSEC Take 40 mg by mouth every morning.   Vitamin D-3 125 MCG (5000 UT) Tabs Take 5,000 Units by mouth daily.   Xarelto 20 MG Tabs tablet Generic drug: rivaroxaban TAKE 1 TABLET (20 MG TOTAL) BY MOUTH DAILY WITH SUPPER. What changed: See the new instructions.       Allergies:  Allergies  Allergen Reactions  . Calcium Channel Blockers Other (See Comments)    Acute hypotension, patient became brady/asystole < 5 seconds    Past Medical History, Surgical history, Social history, and Family History were reviewed and updated.  Review of Systems: All other 10 point review of systems is negative.   Physical Exam:  height is 5' (1.524 m) and weight is 225 lb 12.8 oz (102.4 kg). Her oral temperature is 98.3 F (36.8 C). Her blood pressure is 102/49 (abnormal) and her pulse is 59 (abnormal). Her respiration is 20 and oxygen saturation is  100%.   Wt Readings from Last 3 Encounters:  11/11/20 225 lb 12.8 oz (102.4 kg)  09/08/20 228 lb (103.4 kg)  08/19/20 229 lb 1.3 oz (103.9 kg)    Ocular: Sclerae unicteric, pupils equal, round and reactive to light Ear-nose-throat: Oropharynx clear, dentition fair Lymphatic: No cervical or supraclavicular adenopathy Lungs no rales or rhonchi, good excursion bilaterally Heart regular rate and rhythm, no murmur appreciated Abd soft, nontender, positive bowel sounds MSK no focal spinal tenderness, no joint edema Neuro: non-focal, well-oriented, appropriate affect Breasts: Deferred   Lab Results  Component Value Date   WBC 7.5 11/11/2020   HGB  10.8 (L) 11/11/2020   HCT 34.3 (L) 11/11/2020   MCV 91.0 11/11/2020   PLT 276 11/11/2020   Lab Results  Component Value Date   FERRITIN 31 08/19/2020   IRON 74 08/19/2020   TIBC 415 08/19/2020   UIBC 342 08/19/2020   IRONPCTSAT 18 (L) 08/19/2020   Lab Results  Component Value Date   RETICCTPCT 2.7 11/11/2020   RBC 3.73 (L) 11/11/2020   RBC 3.77 (L) 11/11/2020   No results found for: KPAFRELGTCHN, LAMBDASER, KAPLAMBRATIO No results found for: IGGSERUM, IGA, IGMSERUM No results found for: Marda Stalker, SPEI   Chemistry      Component Value Date/Time   NA 135 11/11/2020 1018   NA 137 10/10/2019 1025   K 4.6 11/11/2020 1018   CL 98 11/11/2020 1018   CO2 24 11/11/2020 1018   BUN 25 (H) 11/11/2020 1018   BUN 25 10/10/2019 1025   CREATININE 1.10 (H) 11/11/2020 1018      Component Value Date/Time   CALCIUM 9.7 11/11/2020 1018   ALKPHOS 56 11/11/2020 1018   AST 15 11/11/2020 1018   ALT 8 11/11/2020 1018   BILITOT 0.8 11/11/2020 1018       Impression and Plan: Ms. Carrie Haynes is a very pleasant 80 yo caucasian female with anemia secondary to GI blood loss while on anticoagulation.  She continues to tolerate oral iron nicely.  We will see what her iron studies look like and replace intravenously if needed.  Follow-up in 3 months.  She can contact our office with any questions or concerns.   Emeline Gins, NP 1/26/202211:05 AM

## 2020-11-12 ENCOUNTER — Other Ambulatory Visit: Payer: Self-pay | Admitting: Family

## 2020-11-12 DIAGNOSIS — D509 Iron deficiency anemia, unspecified: Secondary | ICD-10-CM | POA: Insufficient documentation

## 2020-11-12 LAB — IRON AND TIBC
Iron: 67 ug/dL (ref 41–142)
Saturation Ratios: 17 % — ABNORMAL LOW (ref 21–57)
TIBC: 389 ug/dL (ref 236–444)
UIBC: 322 ug/dL (ref 120–384)

## 2020-11-12 LAB — FERRITIN: Ferritin: 50 ng/mL (ref 11–307)

## 2020-11-12 MED ORDER — FERROUS SULFATE 325 (65 FE) MG PO TABS: 325.0000 mg | ORAL_TABLET | Freq: Every day | ORAL | 6 refills | Status: DC

## 2020-11-14 DIAGNOSIS — J84112 Idiopathic pulmonary fibrosis: Secondary | ICD-10-CM | POA: Diagnosis not present

## 2020-11-14 DIAGNOSIS — I5043 Acute on chronic combined systolic (congestive) and diastolic (congestive) heart failure: Secondary | ICD-10-CM | POA: Diagnosis not present

## 2020-11-18 ENCOUNTER — Other Ambulatory Visit: Payer: Self-pay | Admitting: Family

## 2020-11-25 ENCOUNTER — Other Ambulatory Visit: Payer: Self-pay

## 2020-11-25 ENCOUNTER — Ambulatory Visit: Payer: Medicare Other

## 2020-11-25 ENCOUNTER — Inpatient Hospital Stay: Payer: Medicare Other | Attending: Family

## 2020-11-25 VITALS — BP 114/70 | HR 60 | Temp 97.8°F | Resp 20

## 2020-11-25 DIAGNOSIS — D509 Iron deficiency anemia, unspecified: Secondary | ICD-10-CM

## 2020-11-25 DIAGNOSIS — D508 Other iron deficiency anemias: Secondary | ICD-10-CM | POA: Insufficient documentation

## 2020-11-25 MED ORDER — SODIUM CHLORIDE 0.9 % IV SOLN
125.0000 mg | Freq: Once | INTRAVENOUS | Status: AC
  Administered 2020-11-25: 125 mg via INTRAVENOUS
  Filled 2020-11-25: qty 10

## 2020-11-25 MED ORDER — SODIUM CHLORIDE 0.9 % IV SOLN
Freq: Once | INTRAVENOUS | Status: AC
  Filled 2020-11-25: qty 250

## 2020-11-25 NOTE — Patient Instructions (Signed)
Sodium Ferric Gluconate Complex injection What is this medicine? SODIUM FERRIC GLUCONATE COMPLEX (SOE dee um FER ik GLOO koe nate KOM pleks) is an iron replacement. It is used with epoetin therapy to treat low iron levels in patients who are receiving hemodialysis. This medicine may be used for other purposes; ask your health care provider or pharmacist if you have questions. COMMON BRAND NAME(S): Ferrlecit, Nulecit What should I tell my health care provider before I take this medicine? They need to know if you have any of the following conditions:  anemia that is not from iron deficiency  high levels of iron in the body  an unusual or allergic reaction to iron, benzyl alcohol, other medicines, foods, dyes, or preservatives  pregnant or are trying to become pregnant  breast-feeding How should I use this medicine? This medicine is for infusion into a vein. It is given by a health care professional in a hospital or clinic setting. Talk to your pediatrician regarding the use of this medicine in children. While this drug may be prescribed for children as young as 6 years old for selected conditions, precautions do apply. Overdosage: If you think you have taken too much of this medicine contact a poison control center or emergency room at once. NOTE: This medicine is only for you. Do not share this medicine with others. What if I miss a dose? It is important not to miss your dose. Call your doctor or health care professional if you are unable to keep an appointment. What may interact with this medicine? Do not take this medicine with any of the following medications:  deferoxamine  dimercaprol  other iron products This medicine may also interact with the following medications:  chloramphenicol  deferasirox  medicine for blood pressure like enalapril This list may not describe all possible interactions. Give your health care provider a list of all the medicines, herbs,  non-prescription drugs, or dietary supplements you use. Also tell them if you smoke, drink alcohol, or use illegal drugs. Some items may interact with your medicine. What should I watch for while using this medicine? Your condition will be monitored carefully while you are receiving this medicine. Visit your doctor for check-ups as directed. What side effects may I notice from receiving this medicine? Side effects that you should report to your doctor or health care professional as soon as possible:  allergic reactions like skin rash, itching or hives, swelling of the face, lips, or tongue  breathing problems  changes in hearing  changes in vision  chills, flushing, or sweating  fast, irregular heartbeat  feeling faint or lightheaded, falls  fever, flu-like symptoms  high or low blood pressure  pain, tingling, numbness in the hands or feet  severe pain in the chest, back, flanks, or groin  swelling of the ankles, feet, hands  trouble passing urine or change in the amount of urine  unusually weak or tired Side effects that usually do not require medical attention (report to your doctor or health care professional if they continue or are bothersome):  cramps  dark colored stools  diarrhea  headache  nausea, vomiting  stomach upset This list may not describe all possible side effects. Call your doctor for medical advice about side effects. You may report side effects to FDA at 1-800-FDA-1088. Where should I keep my medicine? This drug is given in a hospital or clinic and will not be stored at home. NOTE: This sheet is a summary. It may not cover all   possible information. If you have questions about this medicine, talk to your doctor, pharmacist, or health care provider.  2021 Elsevier/Gold Standard (2008-06-04 15:58:57)   

## 2020-12-02 ENCOUNTER — Other Ambulatory Visit: Payer: Self-pay

## 2020-12-02 ENCOUNTER — Inpatient Hospital Stay: Payer: Medicare Other

## 2020-12-02 VITALS — BP 108/52 | HR 64 | Temp 97.8°F | Resp 17

## 2020-12-02 DIAGNOSIS — D509 Iron deficiency anemia, unspecified: Secondary | ICD-10-CM

## 2020-12-02 DIAGNOSIS — D508 Other iron deficiency anemias: Secondary | ICD-10-CM | POA: Diagnosis not present

## 2020-12-02 MED ORDER — SODIUM CHLORIDE 0.9 % IV SOLN
125.0000 mg | Freq: Once | INTRAVENOUS | Status: AC
  Administered 2020-12-02: 125 mg via INTRAVENOUS
  Filled 2020-12-02: qty 10

## 2020-12-02 MED ORDER — SODIUM CHLORIDE 0.9 % IV SOLN
Freq: Once | INTRAVENOUS | Status: AC
  Filled 2020-12-02: qty 250

## 2020-12-02 NOTE — Patient Instructions (Signed)
Sodium Ferric Gluconate Complex injection What is this medicine? SODIUM FERRIC GLUCONATE COMPLEX (SOE dee um FER ik GLOO koe nate KOM pleks) is an iron replacement. It is used with epoetin therapy to treat low iron levels in patients who are receiving hemodialysis. This medicine may be used for other purposes; ask your health care provider or pharmacist if you have questions. COMMON BRAND NAME(S): Ferrlecit, Nulecit What should I tell my health care provider before I take this medicine? They need to know if you have any of the following conditions:  anemia that is not from iron deficiency  high levels of iron in the body  an unusual or allergic reaction to iron, benzyl alcohol, other medicines, foods, dyes, or preservatives  pregnant or are trying to become pregnant  breast-feeding How should I use this medicine? This medicine is for infusion into a vein. It is given by a health care professional in a hospital or clinic setting. Talk to your pediatrician regarding the use of this medicine in children. While this drug may be prescribed for children as young as 6 years old for selected conditions, precautions do apply. Overdosage: If you think you have taken too much of this medicine contact a poison control center or emergency room at once. NOTE: This medicine is only for you. Do not share this medicine with others. What if I miss a dose? It is important not to miss your dose. Call your doctor or health care professional if you are unable to keep an appointment. What may interact with this medicine? Do not take this medicine with any of the following medications:  deferoxamine  dimercaprol  other iron products This medicine may also interact with the following medications:  chloramphenicol  deferasirox  medicine for blood pressure like enalapril This list may not describe all possible interactions. Give your health care provider a list of all the medicines, herbs,  non-prescription drugs, or dietary supplements you use. Also tell them if you smoke, drink alcohol, or use illegal drugs. Some items may interact with your medicine. What should I watch for while using this medicine? Your condition will be monitored carefully while you are receiving this medicine. Visit your doctor for check-ups as directed. What side effects may I notice from receiving this medicine? Side effects that you should report to your doctor or health care professional as soon as possible:  allergic reactions like skin rash, itching or hives, swelling of the face, lips, or tongue  breathing problems  changes in hearing  changes in vision  chills, flushing, or sweating  fast, irregular heartbeat  feeling faint or lightheaded, falls  fever, flu-like symptoms  high or low blood pressure  pain, tingling, numbness in the hands or feet  severe pain in the chest, back, flanks, or groin  swelling of the ankles, feet, hands  trouble passing urine or change in the amount of urine  unusually weak or tired Side effects that usually do not require medical attention (report to your doctor or health care professional if they continue or are bothersome):  cramps  dark colored stools  diarrhea  headache  nausea, vomiting  stomach upset This list may not describe all possible side effects. Call your doctor for medical advice about side effects. You may report side effects to FDA at 1-800-FDA-1088. Where should I keep my medicine? This drug is given in a hospital or clinic and will not be stored at home. NOTE: This sheet is a summary. It may not cover all   possible information. If you have questions about this medicine, talk to your doctor, pharmacist, or health care provider.  2021 Elsevier/Gold Standard (2008-06-04 15:58:57)   

## 2020-12-09 DIAGNOSIS — E1169 Type 2 diabetes mellitus with other specified complication: Secondary | ICD-10-CM | POA: Diagnosis not present

## 2020-12-09 DIAGNOSIS — I129 Hypertensive chronic kidney disease with stage 1 through stage 4 chronic kidney disease, or unspecified chronic kidney disease: Secondary | ICD-10-CM | POA: Diagnosis not present

## 2020-12-09 DIAGNOSIS — E785 Hyperlipidemia, unspecified: Secondary | ICD-10-CM | POA: Diagnosis not present

## 2020-12-09 DIAGNOSIS — N183 Chronic kidney disease, stage 3 unspecified: Secondary | ICD-10-CM | POA: Diagnosis not present

## 2020-12-14 ENCOUNTER — Encounter: Payer: Self-pay | Admitting: Internal Medicine

## 2020-12-14 ENCOUNTER — Ambulatory Visit: Payer: Medicare Other | Admitting: Internal Medicine

## 2020-12-14 ENCOUNTER — Other Ambulatory Visit: Payer: Self-pay | Admitting: Cardiovascular Disease

## 2020-12-14 ENCOUNTER — Other Ambulatory Visit: Payer: Self-pay

## 2020-12-14 VITALS — BP 122/66 | HR 66 | Temp 98.4°F | Ht 61.0 in | Wt 223.0 lb

## 2020-12-14 DIAGNOSIS — R0600 Dyspnea, unspecified: Secondary | ICD-10-CM

## 2020-12-14 DIAGNOSIS — J9611 Chronic respiratory failure with hypoxia: Secondary | ICD-10-CM

## 2020-12-14 DIAGNOSIS — I5043 Acute on chronic combined systolic (congestive) and diastolic (congestive) heart failure: Secondary | ICD-10-CM | POA: Diagnosis not present

## 2020-12-14 DIAGNOSIS — R0609 Other forms of dyspnea: Secondary | ICD-10-CM

## 2020-12-14 DIAGNOSIS — J84112 Idiopathic pulmonary fibrosis: Secondary | ICD-10-CM | POA: Diagnosis not present

## 2020-12-14 NOTE — Progress Notes (Signed)
Subjective:     Patient ID: Carrie Haynes, female   DOB: 05-Jul-1941      MRN: [id number]     History of Present Illness  69 yowf never smoker with remote dx of AB but did not stay on inhalers and breathing was fine x ? Years/? Decades but since early 2018 noted doe > admitted  Admit date: 05/10/2017 Discharge date: 05/23/2017   Primary Care Provider: Laurann Montana Primary Cardiologist: Dr. Royann Shivers (per patient)  Discharge Diagnoses    Principal Problem:   Atrial fibrillation [facility]) Active Problems:   Dyspnea   Acute on chronic combined systolic and diastolic CHF (congestive heart failure) (HCC)   Asystole (HCC)   Hypotension   Atrial fibrillation with RVR (HCC)   DCM (dilated cardiomyopathy) (HCC)   Allergies      Allergies  Allergen Reactions  . Calcium Channel Blockers Other (See Comments)    Acute hypotension, patient became brady/asystole < 5 seconds     History of Present Illness     Carrie Haynes is a 80 yo female with PMH of NIDDM, HTN, and HL. Reports she is followed by her PCP for chronic issues. Family hx of CAD with half sister having an MI.  Reports she has felt short of breath over the past 6 months, but recently worsened. States 2 nights ago she developed worsening dyspnea, and orthopnea.Complains of SSCPFelt like a stabbing sensation in her chest with deep inspiration Also in between shoulder blades Again, pleuritic.  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 Was on coumadin afterwards, and then taken off. States she had another PE afterwards, and on coumadin again. This was many years ago per her report. Pt has also had some episodes of dizziness over the past couple of weeks, mostly with walking. Flet like she had to hold herself up with wall Also reports some palpitations prior to admission.   Presented to the ED with worsening dyspnea. She was started on IV heparin given concern for  possible PE. CTA was negative for PE but showed evidence to support pulmonary hypertension. Labs showed stable electrolytes, BNP 115, Hgb 10.6, I stat Trop 0.01, Mag 1.5. CXR negative for edema. EKG showed new onset Afib RVR. Placed on IV dilt and rate improved from 140s to 100s. Given 2g IV mag in the ED.    Hospital Course     Consultants: None  She underwent right and left heart catheterization on 05/12/17 with normal coronaries and with elevated filling pressures consistent with acute on chronic combined systolic and diastolic heart failure. Left ventricular systolic dysfunction was thought to be secondary to tachycardia. She was placed on PO amiodarone and underwent TEE/DCCV on 05/18/17. The cardioversion was unsuccessful. She was continued on anticoagulation (xarelto) and started on IV amiodarone with plans for repeat DCCV. Unfortunately, IV amiodarone infiltrated with bilateral thrombophlebitis in both arms, no skin necrosis. Second DCCV without TEE was performed on 05/23/17 with conversion to sinus rhythm. EKG confirms sinus rhythm.   Will continue xarelto and PO amiodarone.   Borderline BP precludes initiation of conventional heart failure medications. Will repeat echo in three months after established NSR to evaluate function. Reduced LV function may be related to Afib and tachycardia.       07/12/2017 1st [facility] office visit/ Carrie Haynes  Re doe ? Amiodarone related  Chief Complaint  Patient presents with  . Advice Only    Referred  by Dr. Royann Shivers. Pt had an abnormal PFT 05/19/17. Pt admitted to hospital 7/25-8/7 due to atrial fibrillation and was unable to breathe. Pt currently complains of occ. cough and SOB on exertion.  doe x MMRC2 = can't walk a nl pace on a flat grade s sob but does fine slow and flat eg shopping / leaning on cart  When last had nl activity tol wt =  250 and despite wt loss has lost ground with doe over same time frame = about 8 months and much better  since admit but not back to prev baseline Also cough x years sporadic and dry and notes at hs and in am > rest of the day  but no worse on amio Sleeps ok on one pillow  rec Add pepcid 20 mg one hour before bed GERD   Monitor your oxygen level with regular paced walking and let me and Dr C know right away if trending down over several days or weeks (one or two days is not a true "trend" so keep monitoring as regularly as you can)     D/C amiodarone around 07/17/17   10/23/2017  f/u ov/Carrie Haynes re: doe  @ 221  Chief Complaint  Patient presents with  . Follow-up    PFT's done today. She states her breathing is back to her normal baseline and she is no longer coughing. No new co's.   sleeps horizontal R side down  Doe improved/ not tracking sats though and vague about what activities she actually attempts to do at this point rec Keep up the walking and monitor your  0xygen saturations and call for follow up appt if you see a downward trend from this point walking at a slower pace than what you did today. Pulmonary follow up can be as needed    07/28/2020  f/u ov/Dvora Buitron re: steady downhill with breathing and coughing x 3 year Chief Complaint  Patient presents with  . Follow-up    DOE  Dyspnea:  Maybe an aisle or two at [employer] slowly = MMRC3 = can't walk 100 yards even at a slow pace at a flat grade s stopping due to sob  Cough: all day and when lie down then goes away p 5-10 min  and doesn't wake her  Sleeping: ok on R side flat bed one pillow  SABA use: none 02: none occ brb per rectum  Streaky  on stools while maint  on eliquis rec Check your medications when you get home to be sure they correlate with AVS and call with any discrepancies  Zyrtec 10 mg  One hour before bedtime (over the counter)  Omeprazole/Prilosec  should be 40 mg Take 30- 60 min before your first and last meals of the day  GERD diet  Please remember to go to the lab and x-ray department   for your tests - we will  call you with the results when they are available.   Please schedule a follow up office visit in 4 weeks, sooner if needed  Late add :  normocyctic anemia/ streaky brbpr on xarelto will need urgent GI eval and if worsens stop xarelto or go to ER     09/08/2020  f/u ov/Carrie Haynes re:  Worse breathing x 3 y   - anemia at last w/u with desats p 250 ft so now on continuous 02 but not checking sats  Chief Complaint  Patient presents with  . Follow-up    Breathing is much improved since  the last visit. She was hospitalized with anemia 08/05/20-08/11/20 and sent home on o2 3lpm 24/7.   Dyspnea:  Extremely sedentary  Cough: resolved to her satisfaction  Sleeping: flat bed/ one pillow  SABA use: none 02: 3lpm sleep,  3lpm at rest,  3lpm pulsed with ex but not checking pulse yet  rec Make sure you check your oxygen saturations at highest level of activity to be sure it stays over 90%  Weight control is simply a matter of calorie balance which needs to be tilted in your favor by eating less and exercising more.   Please schedule a follow up visit in 3 months but call sooner if needed    12/14/2020  f/u ov/Carrie Haynes re: chronic resp failure p amiodarone  Chief Complaint  Patient presents with  . Follow-up    Seem to be improving  Dyspnea:  avg pace, leaning on cart/ [employer] on 3lpm POC / hc parking  Cough:  occ feels pnds  Sleeping: flat bed one pillow  SABA use: none 02: 3lpm hs and poc 3lplm but not titrating   Covid status:  vaxed to max    No obvious day to day or daytime variability or assoc excess/ purulent sputum or mucus plugs or hemoptysis or cp or chest tightness, subjective wheeze or overt sinus or hb symptoms.   Sleeping  without nocturnal  or early am exacerbation  of respiratory  c/o's or need for noct saba. Also denies any obvious fluctuation of symptoms with weather or environmental changes or other aggravating or alleviating factors except as outlined above   No unusual  exposure hx or h/o childhood pna/ asthma or knowledge of premature birth.  Current Allergies, Complete Past Medical History, Past Surgical History, Family History, and Social History were reviewed in [facility] record.  ROS  The following are not active complaints unless bolded Hoarseness, sore throat, dysphagia, dental problems, itching, sneezing,  nasal congestion or discharge of excess mucus or purulent secretions, ear ache,   fever, chills, sweats, unintended wt loss or wt gain, classically pleuritic or exertional cp,  orthopnea pnd or arm/hand swelling  or leg swelling, presyncope, palpitations, abdominal pain, anorexia, nausea, vomiting, diarrhea  or change in bowel habits or change in bladder habits, change in stools or change in urine, dysuria, hematuria,  rash, arthralgias, visual complaints, headache, numbness, weakness or ataxia or problems with walking or coordination,  change in mood or  memory.        Current Meds  Medication Sig  . atorvastatin (LIPITOR) 20 MG tablet Take 20 mg by mouth daily.  . Cholecalciferol (VITAMIN D-3) 5000 units TABS Take 5,000 Units by mouth daily.  Marland Kitchen dofetilide (TIKOSYN) 500 MCG capsule TAKE 1 CAPSULE BY MOUTH TWICE A DAY (Patient taking differently: Take 500 mcg by mouth 2 (two) times daily.)  . ferrous sulfate 325 (65 FE) MG tablet Take 1 tablet (325 mg total) by mouth daily.  Marland Kitchen FREESTYLE PRECISION NEO TEST test strip USE AS DIRECTED TO TEST BLOOD SUGAR DAILY E11.21  . losartan (COZAAR) 50 MG tablet Take 50 mg by mouth daily.  . Magnesium Oxide 400 MG CAPS Take 1 capsule (400 mg total) by mouth 2 (two) times daily. (Patient taking differently: Take 800 mg by mouth daily.)  . metFORMIN (GLUCOPHAGE-XR) 500 MG 24 hr tablet Take 1,500 mg by mouth at bedtime.   . metoprolol succinate (TOPROL-XL) 50 MG 24 hr tablet Take 1 tablet (50 mg total) by mouth  daily. Take with or immediately following a meal.  . omeprazole (PRILOSEC) 40 MG  capsule Take 40 mg by mouth every morning.  Carlena Hurl 20 MG TABS tablet TAKE 1 TABLET (20 MG TOTAL) BY MOUTH DAILY WITH SUPPER. (Patient taking differently: Take 20 mg by mouth daily with supper.)                       Objective:   Physical Exam   12/14/2020       223  09/08/2020     228  07/28/2020     229  10/23/2017         221   07/12/17 213 lb 9.6 oz (96.9 kg)  05/31/17 215 lb 9.6 oz (97.8 kg)  05/23/17 220 lb (99.8 kg)     Vital signs reviewed  12/14/2020  - Note at rest 02 sats  96% on 3lpm poc    General appearance:  Obese wf nad    HEENT : pt wearing mask not removed for exam due to covid -19 concerns.    NECK :  without JVD/Nodes/TM/ nl carotid upstrokes bilaterally   LUNGS: no acc muscle use,  Nl contour chest which is clear to A and P bilaterally without cough on insp or exp maneuvers   CV:  RRR  no s3 or murmur or increase in P2, and no edema   ABD: quite obese but  soft and nontender with nl inspiratory excursion in the supine position. No bruits or organomegaly appreciated, bowel sounds nl  MS:  Nl gait/ ext warm without deformities, calf tenderness, cyanosis or clubbing No obvious joint restrictions   SKIN: warm and dry without lesions    NEURO:  alert, approp, nl sensorium with  no motor or cerebellar deficits apparent.                           Assessment:

## 2020-12-14 NOTE — Patient Instructions (Addendum)
Make sure you check your oxygen saturation  at your highest level of activity  to be sure it stays over 90% and adjust  02 flow upward to maintain this level if needed but remember to turn it back to previous settings when you stop (to conserve your supply).    Please schedule a follow up visit in 6  months but call sooner if needed        

## 2020-12-15 ENCOUNTER — Encounter: Payer: Self-pay | Admitting: Internal Medicine

## 2020-12-15 ENCOUNTER — Telehealth: Payer: Self-pay | Admitting: Internal Medicine

## 2020-12-15 NOTE — Assessment & Plan Note (Signed)
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

## 2020-12-15 NOTE — Telephone Encounter (Signed)
Order was corrected to state what is needed for [employer] per [facility] Pt aware  Nothing further needed

## 2020-12-15 NOTE — Addendum Note (Signed)
Addended by: Benjie Karvonen R on: 12/15/2020 05:19 PM   Modules accepted: Orders

## 2020-12-15 NOTE — Assessment & Plan Note (Signed)
Placed on 02 07/2020   Advised the goal is to be as active as tol and titrate 02 up if needed to keep sats > 90% at all times

## 2020-12-15 NOTE — Assessment & Plan Note (Signed)
Complicated by dm/hbp/ hyperlidemia  Body mass index is 42.14 kg/m.  -  trending down slightly but long way to go Lab Results  Component Value Date   TSH 1.77 07/28/2020     Contributing to gerd risk/ doe/reviewed the need and the process to achieve and maintain neg calorie balance > defer f/u primary care including intermittently monitoring thyroid status            Each maintenance medication was reviewed in detail including emphasizing most importantly the difference between maintenance and prns and under what circumstances the prns are to be triggered using an action plan format where appropriate.  Total time for H and P, chart review, counseling, reviewing 02 device(s) and generating customized AVS unique to this office visit / same day charting = 25 min

## 2020-12-17 ENCOUNTER — Other Ambulatory Visit: Payer: Self-pay | Admitting: Cardiovascular Disease

## 2021-01-05 ENCOUNTER — Encounter: Payer: Self-pay | Admitting: Cardiovascular Disease

## 2021-01-05 ENCOUNTER — Ambulatory Visit: Payer: Medicare Other | Admitting: Cardiovascular Disease

## 2021-01-05 ENCOUNTER — Other Ambulatory Visit: Payer: Self-pay

## 2021-01-05 VITALS — BP 132/84 | HR 72 | Ht 60.0 in | Wt 225.0 lb

## 2021-01-05 DIAGNOSIS — I10 Essential (primary) hypertension: Secondary | ICD-10-CM

## 2021-01-05 DIAGNOSIS — I4819 Other persistent atrial fibrillation: Secondary | ICD-10-CM

## 2021-01-05 DIAGNOSIS — E1169 Type 2 diabetes mellitus with other specified complication: Secondary | ICD-10-CM

## 2021-01-05 DIAGNOSIS — I5032 Chronic diastolic (congestive) heart failure: Secondary | ICD-10-CM | POA: Diagnosis not present

## 2021-01-05 DIAGNOSIS — Z5181 Encounter for therapeutic drug level monitoring: Secondary | ICD-10-CM

## 2021-01-05 DIAGNOSIS — I2781 Cor pulmonale (chronic): Secondary | ICD-10-CM

## 2021-01-05 DIAGNOSIS — Z79899 Other long term (current) drug therapy: Secondary | ICD-10-CM

## 2021-01-05 DIAGNOSIS — J8410 Pulmonary fibrosis, unspecified: Secondary | ICD-10-CM

## 2021-01-05 DIAGNOSIS — E669 Obesity, unspecified: Secondary | ICD-10-CM

## 2021-01-05 DIAGNOSIS — Z7901 Long term (current) use of anticoagulants: Secondary | ICD-10-CM

## 2021-01-05 MED ORDER — MAGNESIUM OXIDE 400 MG PO CAPS: 400.0000 mg | ORAL_CAPSULE | Freq: Every day | ORAL | 3 refills | Status: DC

## 2021-01-05 NOTE — Progress Notes (Signed)
Cardiology Office Note:    Date:  01/11/2021   ID:  Carrie Haynes, DOB 04-07-1941, MRN [id number]  PCP:  Laurann Montana, MD  Cardiologist:  Thurmon Fair, MD    Referring MD: Laurann Montana, MD   Chief Complaint  Patient presents with  . Atrial Fibrillation     History of Present Illness:    Carrie Haynes is a 80 y.o. 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  Initially diagnosed with atrial fibrillation with rapid ventricular response in July 2018 when she presented with tachycardia cardiomyopathy.  Her echo showed mildly depressed LVEF at 45% with diffuse hypokinesis and moderate mitral regurgitation, mildly dilated left atrium, but also a moderately dilated right atrium with moderate to severe tricuspid regurgitation and mild pulmonary hypertension.  Coronary angiography showed no evidence of significant CAD.  Rate control was difficult.  She underwent cardioversion on August 2 unsuccessfully, but eventually after loading with IV amiodarone was successfully cardioverted on August 7. Pulmonary function tests (before treatment with amiodarone) raised concern for restrictive lung disease, thereby making long-term treatment with amiodarone undesirable.  Amiodarone was stopped in November 2018. Follow-up echocardiogram shows complete normalization of left ventricular systolic function, EF 55-60%, with some residual signs of impaired relaxation.  A CT of the chest performed in July 2020 did show evidence of mild-moderate pulmonary fibrosis, but this was unchanged from the previous CT in 2018.    In November 2020 she presented with recurrent shortness of breath and was found to again have atrial fibrillation.  She was in rapid ventricular response but was unaware of the arrhythmia.  Echocardiography shows her EF has again  dropped to 45-50%.  She was hospitalized for dofetilide loading, converted to sinus rhythm without electrical cardioversion during antiarrhythmic administration.  Dyspnea remains her biggest complaint, NYHA class 2 (occasionally class 3), unchanged. Remains on O2 at 3l/min. Denies orthopnea and edema, no anginaHas dysphagia for pills, but not food and EGD was OK. No palpitations or syncope.  No more hematochezia, but is having diarrhea.  Regular rhythm today.   Past Medical History:  Diagnosis Date  . A-fib (HCC)   . Diabetes mellitus without complication (HCC)   . Hyperlipemia   . Hypertension     Past Surgical History:  Procedure Laterality Date  . CARDIOVERSION N/A 05/18/2017   Procedure: CARDIOVERSION;  Surgeon: Wendall Stade, MD;  Location: [facility] ENDOSCOPY;  Service: Cardiovascular;  Laterality: N/A;  . CARDIOVERSION N/A 05/23/2017   Procedure: CARDIOVERSION;  Surgeon: Quintella Reichert, MD;  Location: [facility] ENDOSCOPY;  Service: Cardiovascular;  Laterality: N/A;  . CARDIOVERSION N/A 08/28/2019   Procedure: CARDIOVERSION;  Surgeon: Jake Bathe, MD;  Location: [facility] ENDOSCOPY;  Service: Cardiovascular;  Laterality: N/A;  . COLONOSCOPY N/A 08/07/2020   Procedure: COLONOSCOPY;  Surgeon: Willis Modena, MD;  Location: [facility] ENDOSCOPY;  Service: Endoscopy;  Laterality: N/A;  . ESOPHAGOGASTRODUODENOSCOPY N/A 08/07/2020   Procedure: ESOPHAGOGASTRODUODENOSCOPY (EGD);  Surgeon: Willis Modena, MD;  Location: [facility] ENDOSCOPY;  Service: Endoscopy;  Laterality: N/A;  . GIVENS CAPSULE STUDY N/A 08/09/2020   Procedure: GIVENS CAPSULE STUDY;  Surgeon: Willis Modena, MD;  Location: [facility] ENDOSCOPY;  Service: Endoscopy;  Laterality: N/A;  . RIGHT/LEFT HEART CATH AND CORONARY ANGIOGRAPHY N/A 05/12/2017   Procedure: Right/Left Heart Cath and Coronary Angiography;  Surgeon: Lyn Records, MD;  Location: [facility] INVASIVE CV LAB;  Service: Cardiovascular;  Laterality: N/A;  . TEE WITHOUT CARDIOVERSION N/A 05/18/2017    Procedure: TRANSESOPHAGEAL ECHOCARDIOGRAM (TEE);  Surgeon: Wendall Stade, MD;  Location: [facility] ENDOSCOPY;  Service: Cardiovascular;  Laterality: N/A;    Current Medications: Current Meds  Medication Sig  . atorvastatin (LIPITOR) 20 MG tablet Take 20 mg by mouth daily.  . Cholecalciferol (VITAMIN D-3) 5000 units TABS Take 5,000 Units by mouth daily.  Marland Kitchen dofetilide (TIKOSYN) 500 MCG capsule Take 1 capsule (500 mcg total) by mouth 2 (two) times daily.  . ferrous sulfate 325 (65 FE) MG tablet Take 1 tablet (325 mg total) by mouth daily.  Marland Kitchen FREESTYLE PRECISION NEO TEST test strip USE AS DIRECTED TO TEST BLOOD SUGAR DAILY E11.21  . losartan (COZAAR) 50 MG tablet Take 50 mg by mouth daily.  . metFORMIN (GLUCOPHAGE-XR) 500 MG 24 hr tablet Take 1,500 mg by mouth at bedtime.   . metoprolol succinate (TOPROL-XL) 50 MG 24 hr tablet Take 1 tablet (50 mg total) by mouth daily. Take with or immediately following a meal.  . XARELTO 20 MG TABS tablet TAKE 1 TABLET (20 MG TOTAL) BY MOUTH DAILY WITH SUPPER.  . [DISCONTINUED] Magnesium Oxide 400 MG CAPS Take 1 capsule (400 mg total) by mouth 2 (two) times daily. (Patient taking differently: Take 400 mg by mouth 2 (two) times daily. 1 Tablet Twice Daily)  . [DISCONTINUED] omeprazole (PRILOSEC) 40 MG capsule Take 40 mg by mouth every morning.     Allergies:   Calcium channel blockers, Estrogens conjugated, and Nystatin   Social History   Socioeconomic History  . Marital status: Widowed    Spouse name: Not on file  . Number of children: Not on file  . Years of education: Not on file  . Highest education level: Not on file  Occupational History  . Not on file  Tobacco Use  . Smoking status: Never Smoker  . Smokeless tobacco: Never Used  Vaping Use  . Vaping Use: Never used  Substance and Sexual Activity  . Alcohol use: No  . Drug use: No  . Sexual activity: Not on file  Other Topics Concern  . Not on file  Social History Narrative  . Not on file    Social Determinants of Health   Financial Resource Strain: Not on file  Food Insecurity: Not on file  Transportation Needs: Not on file  Physical Activity: Not on file  Stress: Not on file  Social Connections: Not on file     Family History: The patient's family history includes Breast cancer in an other family member; Heart attack in her sister; Hypertension in her father. ROS:   Please see the history of present illness.    All other systems are reviewed and are negative.  EKGs/Labs/Other Studies Reviewed:    The following studies were reviewed today:  ECHO 09/03/2019:  1. Left ventricular ejection fraction, by visual estimation, is 45 to 50%. ...   ECHO 10/25/2019:  1. Left ventricular ejection fraction, by visual estimation, is 55 to  60%. The left ventricle has normal function. There is no left ventricular  hypertrophy.  2. Left ventricular diastolic parameters are consistent with Grade I  diastolic dysfunction (impaired relaxation).  3. The left ventricle has no regional wall motion abnormalities.  4. Global right ventricle has mildly reduced systolic function.The right  ventricular size is mildly enlarged. No increase in right ventricular wall  thickness.  5. Left atrial size was mildly dilated.  6. Right atrial size was normal.  7. Mild mitral annular calcification.  8. The mitral valve is normal in structure. Mild mitral valve  regurgitation. No evidence of mitral stenosis.  9. The tricuspid valve is normal in structure.  10. The aortic valve is tricuspid. Aortic valve regurgitation is not  visualized. Mild to moderate aortic valve sclerosis/calcification without  any evidence of aortic stenosis.  11. The tricuspid regurgitant velocity is 3.35 m/s, and with an assumed  right atrial pressure of 3 mmHg, the estimated right ventricular systolic  pressure is moderately elevated at 47.9 mmHg.  12. The inferior vena cava is normal in size with greater  than 50%  respiratory variability, suggesting right atrial pressure of 3 mmHg.   EKG:  EKG is ordered today.  It shows NSR with PACs and PVCs, generalized low voltage (obesity). QTc 451 ms.  Recent Labs: 07/28/2020: Pro B Natriuretic peptide (BNP) 370.0; TSH 1.77 08/11/2020: Magnesium 1.8 11/11/2020: ALT 8; BUN 25; Creatinine 1.10; Hemoglobin 10.8; Platelet Count 276; Potassium 4.6; Sodium 135   06/08/2020 Hemoglobin 12.3, creatinine 1.1, potassium 5.0, normal liver function tests, TSH 2.83  Recent Lipid Panel    Component Value Date/Time   CHOL 165 10/10/2019 1025   TRIG 96 10/10/2019 1025   HDL 52 10/10/2019 1025   CHOLHDL 3.2 10/10/2019 1025   CHOLHDL 2.6 05/13/2017 0331   VLDL 10 05/13/2017 0331   LDLCALC 95 10/10/2019 1025   06/08/2020 Total cholesterol 156, HDL 53, LDL 80, triglycerides 126 Physical Exam:    VS:  BP 132/84   Pulse 72   Ht 5' (1.524 m)   Wt 225 lb (102.1 kg)   SpO2 92%   BMI 43.94 kg/m     Wt Readings from Last 3 Encounters:  01/05/21 225 lb (102.1 kg)  12/14/20 223 lb (101.2 kg)  11/11/20 225 lb 12.8 oz (102.4 kg)     General: Alert, oriented x3, no distress, morbidly obese Head: no evidence of trauma, PERRL, EOMI, no exophtalmos or lid lag, no myxedema, no xanthelasma; normal ears, nose and oropharynx Neck: normal jugular venous pulsations and no hepatojugular reflux; brisk carotid pulses without delay and no carotid bruits Chest: clear to auscultation, no signs of consolidation by percussion or palpation, normal fremitus, symmetrical and full respiratory excursions Cardiovascular: normal position and quality of the apical impulse, regular rhythm with occasional ectopy, normal first and second heart sounds, no murmurs, rubs or gallops Abdomen: no tenderness or distention, no masses by palpation, no abnormal pulsatility or arterial bruits, normal bowel sounds, no hepatosplenomegaly Extremities: no clubbing, cyanosis or edema; 2+ radial, ulnar and  brachial pulses bilaterally; 2+ right femoral, posterior tibial and dorsalis pedis pulses; 2+ left femoral, posterior tibial and dorsalis pedis pulses; no subclavian or femoral bruits Neurological: grossly nonfocal Psych: Normal mood and affect  ASSESSMENT:    1. Persistent atrial fibrillation (HCC)   2. Chronic diastolic heart failure (HCC)   3. Pulmonary fibrosis (HCC)   4. Cor pulmonale, chronic (HCC)   5. Essential hypertension   6. Morbid obesity due to excess calories (HCC)   7. Diabetes mellitus type 2 in obese (HCC)   8. Encounter for monitoring dofetilide therapy   9. Long term (current) use of anticoagulants    PLAN:    In order of problems listed above:  1. AFib: in sinus rhythm on dofetilide, high risk of arrhythmia recurrence with biatrial dilation.  Continue anticoagulation with Xarelto.  CHA2DS2-VASc 5 (age 61, gender, hypertension, CHF).   2. CHF: each time, LVEF has normalized  when back in NSR. No overt hypervolemia on exam. 3. Pulmonary fibrosis: Etiology uncertain. Sees Dr. Sherene Sires, recent visit in February: "No additonal w/u needs, main goal now is wt loss/ rehab". She was only on amiodarone for about 3 months and restrictive lung disease abnormalities on her PFTs were present before amiodarone was ever initiated.   Clearly some part of her restriction is due to her obesity. 4. Cor pulmonale: likely multifactorial due toi restrictive lung disease, obesity, previous venous thromboembolic events, possible sleep disordered breathing.   5. HTN: controlled on losartan and metoprolol 6. Obesity: at least part of her restrictive lung problems 7. DM: good control, A1c 6.8%.  Excellent lipid profile. 8. Dofetilide monitoring: QT in good range, recent normal K and creatinine.  Reminded her she needs to have EKG and labs every 6 months and reminded her of the risk of multiple drug interactions with this medication. 9. Anticoagulation: has had heminor rectal bleeding, no serious  complications. 10. Diarrhea: reduce magnesium to once daily.   Medication Adjustments/Labs and Tests Ordered: Current medicines are reviewed at length with the patient today.  Concerns regarding medicines are outlined above.  Orders Placed This Encounter  Procedures  . EKG 12-Lead   Meds ordered this encounter  Medications  . Magnesium Oxide 400 MG CAPS    Sig: Take 1 capsule (400 mg total) by mouth daily.    Dispense:  90 capsule    Refill:  3    Signed, Thurmon Fair, MD  01/11/2021 9:49 PM    [facility]

## 2021-01-05 NOTE — Patient Instructions (Signed)
Medication Instructions:  STOP the Omeprazole DECREASE the Magnesium to 400 mg once daily  *If you need a refill on your cardiac medications before your next appointment, please call your pharmacy*   Lab Work: None ordered If you have labs (blood work) drawn today and your tests are completely normal, you will receive your results only by: Marland Kitchen MyChart Message (if you have MyChart) OR . A paper copy in the mail If you have any lab test that is abnormal or we need to change your treatment, we will call you to review the results.   Testing/Procedures: None ordered   Follow-Up: At [facility], you and your health needs are our priority.  As part of our continuing mission to provide you with exceptional heart care, we have created designated Provider Care Teams.  These Care Teams include your primary Cardiologist (physician) and Advanced Practice Providers (APPs -  Physician Assistants and Nurse Practitioners) who all work together to provide you with the care you need, when you need it.  We recommend signing up for the patient portal called "MyChart".  Sign up information is provided on this After Visit Summary.  MyChart is used to connect with patients for Virtual Visits (Telemedicine).  Patients are able to view lab/test results, encounter notes, upcoming appointments, etc.  Non-urgent messages can be sent to your provider as well.   To learn more about what you can do with MyChart, go to [email or web address].    Your next appointment:   6 month(s)  The format for your next appointment:   In Person  Provider:   You may see Thurmon Fair, MD or one of the following Advanced Practice Providers on your designated Care Team:    Azalee Course, PA-C  Micah Flesher, PA-C or   Judy Pimple, [place]

## 2021-01-11 ENCOUNTER — Encounter: Payer: Self-pay | Admitting: Cardiovascular Disease

## 2021-01-12 DIAGNOSIS — J84112 Idiopathic pulmonary fibrosis: Secondary | ICD-10-CM | POA: Diagnosis not present

## 2021-01-12 DIAGNOSIS — I5043 Acute on chronic combined systolic (congestive) and diastolic (congestive) heart failure: Secondary | ICD-10-CM | POA: Diagnosis not present

## 2021-01-13 ENCOUNTER — Other Ambulatory Visit: Payer: Self-pay | Admitting: Cardiovascular Disease

## 2021-02-09 ENCOUNTER — Inpatient Hospital Stay (HOSPITAL_BASED_OUTPATIENT_CLINIC_OR_DEPARTMENT_OTHER): Payer: Medicare Other | Admitting: Family

## 2021-02-09 ENCOUNTER — Other Ambulatory Visit: Payer: Self-pay

## 2021-02-09 ENCOUNTER — Inpatient Hospital Stay: Payer: Medicare Other | Attending: Family

## 2021-02-09 ENCOUNTER — Telehealth: Payer: Self-pay

## 2021-02-09 VITALS — BP 105/54 | HR 60 | Resp 21 | Ht 60.0 in | Wt 223.8 lb

## 2021-02-09 DIAGNOSIS — D500 Iron deficiency anemia secondary to blood loss (chronic): Secondary | ICD-10-CM

## 2021-02-09 DIAGNOSIS — R0602 Shortness of breath: Secondary | ICD-10-CM | POA: Insufficient documentation

## 2021-02-09 DIAGNOSIS — K922 Gastrointestinal hemorrhage, unspecified: Secondary | ICD-10-CM | POA: Diagnosis not present

## 2021-02-09 DIAGNOSIS — Z9981 Dependence on supplemental oxygen: Secondary | ICD-10-CM | POA: Diagnosis not present

## 2021-02-09 LAB — CMP (CANCER CENTER ONLY)
ALT: 10 U/L (ref 0–44)
AST: 15 U/L (ref 15–41)
Albumin: 4 g/dL (ref 3.5–5.0)
Alkaline Phosphatase: 49 U/L (ref 38–126)
Anion gap: 9 (ref 5–15)
BUN: 23 mg/dL (ref 8–23)
CO2: 27 mmol/L (ref 22–32)
Calcium: 9.6 mg/dL (ref 8.9–10.3)
Chloride: 100 mmol/L (ref 98–111)
Creatinine: 0.93 mg/dL (ref 0.44–1.00)
GFR, Estimated: >60 mL/min (ref >60)
Glucose, Bld: 133 mg/dL — ABNORMAL HIGH (ref 70–99)
Potassium: 4.6 mmol/L (ref 3.5–5.1)
Sodium: 136 mmol/L (ref 135–145)
Total Bilirubin: 0.7 mg/dL (ref 0.3–1.2)
Total Protein: 7.1 g/dL (ref 6.5–8.1)

## 2021-02-09 LAB — RETICULOCYTES
Immature Retic Fract: 16.5 % — ABNORMAL HIGH (ref 2.3–15.9)
RBC.: 3.78 MIL/uL — ABNORMAL LOW (ref 3.87–5.11)
Retic Count, Absolute: 80.9 10*3/uL (ref 19.0–186.0)
Retic Ct Pct: 2.1 % (ref 0.4–3.1)

## 2021-02-09 LAB — CBC WITH DIFFERENTIAL (CANCER CENTER ONLY)
Abs Immature Granulocytes: 0.02 10*3/uL (ref 0.00–0.07)
Basophils Absolute: 0 10*3/uL (ref 0.0–0.1)
Basophils Relative: 1 %
Eosinophils Absolute: 0.1 10*3/uL (ref 0.0–0.5)
Eosinophils Relative: 1 %
HCT: 35.1 % — ABNORMAL LOW (ref 36.0–46.0)
Hemoglobin: 11.4 g/dL — ABNORMAL LOW (ref 12.0–15.0)
Immature Granulocytes: 0 %
Lymphocytes Relative: 35 %
Lymphs Abs: 2.4 10*3/uL (ref 0.7–4.0)
MCH: 30 pg (ref 26.0–34.0)
MCHC: 32.5 g/dL (ref 30.0–36.0)
MCV: 92.4 fL (ref 80.0–100.0)
Monocytes Absolute: 0.6 10*3/uL (ref 0.1–1.0)
Monocytes Relative: 9 %
Neutro Abs: 3.8 10*3/uL (ref 1.7–7.7)
Neutrophils Relative %: 54 %
Platelet Count: 237 10*3/uL (ref 150–400)
RBC: 3.8 MIL/uL — ABNORMAL LOW (ref 3.87–5.11)
RDW: 15 % (ref 11.5–15.5)
WBC Count: 7 10*3/uL (ref 4.0–10.5)
nRBC: 0 % (ref 0.0–0.2)

## 2021-02-09 NOTE — Progress Notes (Signed)
Hematology and Oncology Follow Up Visit  Carrie Haynes [id number] 01-14-1941 80 y.o. 02/09/2021   Principle Diagnosis:  Iron deficiency anemia secondary to GI blood loss  Current Therapy:        IV iron as indicated    Interim History:  Carrie Haynes is here today for follow-up. She is doing quite well and has no complaints at this time.  She tolerated IV iron nicely and denies fatigue at this time.  She is doing well on 3L [place] supportive O2 throughout the night and during the day as needed.  SOB is stable on O2. She has a dry cough intermittently.  No fever, chills, n/v, rash, dizziness, chest pain, palpitations, abdominal pain or changes in bowel or bladder habits.  No swelling, tenderness, numbness or tingling in her extremities.  No falls or syncope to report.  She has maintained a good appetite and is staying well hydrated throughout the day. Weight is stable at 223 lbs.   ECOG Performance Status: 1 - Symptomatic but completely ambulatory  Medications:  Allergies as of 02/09/2021      Reactions   Calcium Channel Blockers Other (See Comments)   Acute hypotension, patient became brady/asystole < 5 seconds   Estrogens Conjugated    Other reaction(s): DVT   Nystatin Hives      Medication List       Accurate as of February 09, 2021 11:09 AM. If you have any questions, ask your nurse or doctor.        atorvastatin 20 MG tablet Commonly known as: LIPITOR Take 20 mg by mouth daily.   dofetilide 500 MCG capsule Commonly known as: TIKOSYN Take 1 capsule (500 mcg total) by mouth 2 (two) times daily.   ferrous sulfate 325 (65 FE) MG tablet Take 1 tablet (325 mg total) by mouth daily.   FreeStyle Precision Neo Test test strip Generic drug: glucose blood USE AS DIRECTED TO TEST BLOOD SUGAR DAILY E11.21   losartan 50 MG tablet Commonly known as: COZAAR Take 50 mg by mouth daily.   Magnesium Oxide 400 MG Caps Take 1 capsule (400 mg total) by mouth daily.   metFORMIN  500 MG 24 hr tablet Commonly known as: GLUCOPHAGE-XR Take 1,500 mg by mouth at bedtime.   metoprolol succinate 50 MG 24 hr tablet Commonly known as: TOPROL-XL TAKE 1 TABLET (50 MG TOTAL) BY MOUTH DAILY. TAKE WITH OR IMMEDIATELY FOLLOWING A MEAL.   Vitamin D-3 125 MCG (5000 UT) Tabs Take 5,000 Units by mouth daily.   Xarelto 20 MG Tabs tablet Generic drug: rivaroxaban TAKE 1 TABLET (20 MG TOTAL) BY MOUTH DAILY WITH SUPPER.       Allergies:  Allergies  Allergen Reactions  . Calcium Channel Blockers Other (See Comments)    Acute hypotension, patient became brady/asystole < 5 seconds  . Estrogens Conjugated     Other reaction(s): DVT  . Nystatin Hives    Past Medical History, Surgical history, Social history, and Family History were reviewed and updated.  Review of Systems: All other 10 point review of systems is negative.   Physical Exam:  height is 5' (1.524 m) and weight is 223 lb 12.8 oz (101.5 kg). Her pulse is 60. Her respiration is 21 (abnormal) and oxygen saturation is 98%.   Wt Readings from Last 3 Encounters:  02/09/21 223 lb 12.8 oz (101.5 kg)  01/05/21 225 lb (102.1 kg)  12/14/20 223 lb (101.2 kg)    Ocular: Sclerae unicteric, pupils equal, round and  reactive to light Ear-nose-throat: Oropharynx clear, dentition fair Lymphatic: No cervical or supraclavicular adenopathy Lungs no rales or rhonchi, good excursion bilaterally Heart regular rate and rhythm, no murmur appreciated Abd soft, nontender, positive bowel sounds MSK no focal spinal tenderness, no joint edema Neuro: non-focal, well-oriented, appropriate affect Breasts: Deferred   Lab Results  Component Value Date   WBC 7.0 02/09/2021   HGB 11.4 (L) 02/09/2021   HCT 35.1 (L) 02/09/2021   MCV 92.4 02/09/2021   PLT 237 02/09/2021   Lab Results  Component Value Date   FERRITIN 50 11/11/2020   IRON 67 11/11/2020   TIBC 389 11/11/2020   UIBC 322 11/11/2020   IRONPCTSAT 17 (L) 11/11/2020   Lab  Results  Component Value Date   RETICCTPCT 2.1 02/09/2021   RBC 3.78 (L) 02/09/2021   No results found for: KPAFRELGTCHN, LAMBDASER, KAPLAMBRATIO No results found for: IGGSERUM, IGA, IGMSERUM No results found for: Marda Stalker, SPEI   Chemistry      Component Value Date/Time   NA 135 11/11/2020 1018   NA 137 10/10/2019 1025   K 4.6 11/11/2020 1018   CL 98 11/11/2020 1018   CO2 24 11/11/2020 1018   BUN 25 (H) 11/11/2020 1018   BUN 25 10/10/2019 1025   CREATININE 1.10 (H) 11/11/2020 1018      Component Value Date/Time   CALCIUM 9.7 11/11/2020 1018   ALKPHOS 56 11/11/2020 1018   AST 15 11/11/2020 1018   ALT 8 11/11/2020 1018   BILITOT 0.8 11/11/2020 1018       Impression and Plan: Carrie Haynes is a very pleasant 80 yo caucasian female with anemia secondary to GI blood loss while on anticoagulation. Iron studies are pending. We will replace if needed.  Follow-up in 4 months.  She can contact our office with any questions or concerns.   Emeline Gins, NP 4/26/202211:09 AM

## 2021-02-09 NOTE — Telephone Encounter (Signed)
appts made and printed for pt per 02/09/21 los  Carrie Haynes 

## 2021-02-10 LAB — FERRITIN: Ferritin: 68 ng/mL (ref 11–307)

## 2021-02-10 LAB — IRON AND TIBC
Iron: 75 ug/dL (ref 41–142)
Saturation Ratios: 22 % (ref 21–57)
TIBC: 344 ug/dL (ref 236–444)
UIBC: 269 ug/dL (ref 120–384)

## 2021-02-12 DIAGNOSIS — I5043 Acute on chronic combined systolic (congestive) and diastolic (congestive) heart failure: Secondary | ICD-10-CM | POA: Diagnosis not present

## 2021-02-12 DIAGNOSIS — J84112 Idiopathic pulmonary fibrosis: Secondary | ICD-10-CM | POA: Diagnosis not present

## 2021-03-11 DIAGNOSIS — E1169 Type 2 diabetes mellitus with other specified complication: Secondary | ICD-10-CM | POA: Diagnosis not present

## 2021-03-11 DIAGNOSIS — D500 Iron deficiency anemia secondary to blood loss (chronic): Secondary | ICD-10-CM | POA: Diagnosis not present

## 2021-03-11 DIAGNOSIS — E559 Vitamin D deficiency, unspecified: Secondary | ICD-10-CM | POA: Diagnosis not present

## 2021-03-11 DIAGNOSIS — N183 Chronic kidney disease, stage 3 unspecified: Secondary | ICD-10-CM | POA: Diagnosis not present

## 2021-03-11 DIAGNOSIS — E785 Hyperlipidemia, unspecified: Secondary | ICD-10-CM | POA: Diagnosis not present

## 2021-03-11 DIAGNOSIS — I129 Hypertensive chronic kidney disease with stage 1 through stage 4 chronic kidney disease, or unspecified chronic kidney disease: Secondary | ICD-10-CM | POA: Diagnosis not present

## 2021-03-14 DIAGNOSIS — J84112 Idiopathic pulmonary fibrosis: Secondary | ICD-10-CM | POA: Diagnosis not present

## 2021-03-14 DIAGNOSIS — I5043 Acute on chronic combined systolic (congestive) and diastolic (congestive) heart failure: Secondary | ICD-10-CM | POA: Diagnosis not present

## 2021-03-17 ENCOUNTER — Other Ambulatory Visit: Payer: Self-pay

## 2021-03-23 ENCOUNTER — Other Ambulatory Visit: Payer: Self-pay | Admitting: *Deleted

## 2021-03-23 MED ORDER — DOFETILIDE 500 MCG PO CAPS: 500.0000 ug | ORAL_CAPSULE | Freq: Two times a day (BID) | ORAL | 1 refills | Status: DC

## 2021-04-14 DIAGNOSIS — J84112 Idiopathic pulmonary fibrosis: Secondary | ICD-10-CM | POA: Diagnosis not present

## 2021-04-14 DIAGNOSIS — I5043 Acute on chronic combined systolic (congestive) and diastolic (congestive) heart failure: Secondary | ICD-10-CM | POA: Diagnosis not present

## 2021-05-14 DIAGNOSIS — I5043 Acute on chronic combined systolic (congestive) and diastolic (congestive) heart failure: Secondary | ICD-10-CM | POA: Diagnosis not present

## 2021-05-14 DIAGNOSIS — J84112 Idiopathic pulmonary fibrosis: Secondary | ICD-10-CM | POA: Diagnosis not present

## 2021-06-03 DIAGNOSIS — I129 Hypertensive chronic kidney disease with stage 1 through stage 4 chronic kidney disease, or unspecified chronic kidney disease: Secondary | ICD-10-CM | POA: Diagnosis not present

## 2021-06-03 DIAGNOSIS — Z0000 Encounter for general adult medical examination without abnormal findings: Secondary | ICD-10-CM | POA: Diagnosis not present

## 2021-06-03 DIAGNOSIS — E1169 Type 2 diabetes mellitus with other specified complication: Secondary | ICD-10-CM | POA: Diagnosis not present

## 2021-06-03 DIAGNOSIS — D500 Iron deficiency anemia secondary to blood loss (chronic): Secondary | ICD-10-CM | POA: Diagnosis not present

## 2021-06-03 DIAGNOSIS — N183 Chronic kidney disease, stage 3 unspecified: Secondary | ICD-10-CM | POA: Diagnosis not present

## 2021-06-09 ENCOUNTER — Inpatient Hospital Stay: Payer: Medicare Other | Admitting: Family

## 2021-06-09 ENCOUNTER — Other Ambulatory Visit: Payer: Self-pay

## 2021-06-09 ENCOUNTER — Encounter: Payer: Self-pay | Admitting: Family

## 2021-06-09 ENCOUNTER — Inpatient Hospital Stay: Payer: Medicare Other | Attending: Family

## 2021-06-09 VITALS — BP 92/40 | HR 60 | Temp 98.4°F | Resp 20 | Wt 224.8 lb

## 2021-06-09 DIAGNOSIS — K922 Gastrointestinal hemorrhage, unspecified: Secondary | ICD-10-CM | POA: Insufficient documentation

## 2021-06-09 DIAGNOSIS — Z79899 Other long term (current) drug therapy: Secondary | ICD-10-CM | POA: Insufficient documentation

## 2021-06-09 DIAGNOSIS — Z7901 Long term (current) use of anticoagulants: Secondary | ICD-10-CM | POA: Insufficient documentation

## 2021-06-09 DIAGNOSIS — D500 Iron deficiency anemia secondary to blood loss (chronic): Secondary | ICD-10-CM | POA: Diagnosis not present

## 2021-06-09 DIAGNOSIS — Z86718 Personal history of other venous thrombosis and embolism: Secondary | ICD-10-CM | POA: Diagnosis not present

## 2021-06-09 LAB — CBC WITH DIFFERENTIAL (CANCER CENTER ONLY)
Abs Immature Granulocytes: 0.03 10*3/uL (ref 0.00–0.07)
Basophils Absolute: 0 10*3/uL (ref 0.0–0.1)
Basophils Relative: 0 %
Eosinophils Absolute: 0.1 10*3/uL (ref 0.0–0.5)
Eosinophils Relative: 1 %
HCT: 34.2 % — ABNORMAL LOW (ref 36.0–46.0)
Hemoglobin: 11.2 g/dL — ABNORMAL LOW (ref 12.0–15.0)
Immature Granulocytes: 0 %
Lymphocytes Relative: 28 %
Lymphs Abs: 2 10*3/uL (ref 0.7–4.0)
MCH: 31.5 pg (ref 26.0–34.0)
MCHC: 32.7 g/dL (ref 30.0–36.0)
MCV: 96.3 fL (ref 80.0–100.0)
Monocytes Absolute: 0.6 10*3/uL (ref 0.1–1.0)
Monocytes Relative: 9 %
Neutro Abs: 4.3 10*3/uL (ref 1.7–7.7)
Neutrophils Relative %: 62 %
Platelet Count: 211 10*3/uL (ref 150–400)
RBC: 3.55 MIL/uL — ABNORMAL LOW (ref 3.87–5.11)
RDW: 13.2 % (ref 11.5–15.5)
WBC Count: 7 10*3/uL (ref 4.0–10.5)
nRBC: 0 % (ref 0.0–0.2)

## 2021-06-09 LAB — RETICULOCYTES
Immature Retic Fract: 16.2 % — ABNORMAL HIGH (ref 2.3–15.9)
RBC.: 3.55 MIL/uL — ABNORMAL LOW (ref 3.87–5.11)
Retic Count, Absolute: 84.5 10*3/uL (ref 19.0–186.0)
Retic Ct Pct: 2.4 % (ref 0.4–3.1)

## 2021-06-09 LAB — IRON AND TIBC
Iron: 66 ug/dL (ref 41–142)
Saturation Ratios: 19 % — ABNORMAL LOW (ref 21–57)
TIBC: 341 ug/dL (ref 236–444)
UIBC: 276 ug/dL (ref 120–384)

## 2021-06-09 LAB — FERRITIN: Ferritin: 67 ng/mL (ref 11–307)

## 2021-06-09 NOTE — Progress Notes (Signed)
Hematology and Oncology Follow Up Visit  Carrie Haynes [id number] Mar 31, 1941 80 y.o. 06/09/2021   Principle Diagnosis:  Iron deficiency anemia secondary to GI blood loss   Current Therapy:        IV iron as indicated     Interim History:  Ms. Carrie Haynes is here today for follow-up. She is doing well and has no complaints at this time.  She is doing well on 2L [place] supplemental O2 24 hours a day. She has some SOB with over exertion and takes a break to rest when needed.  She has not noted any blood loss. No bruising or petechiae.  No fever, chills, n/v, cough, rash, dizziness, chest pain, palpitations, abdominal pain or changes in bowel or bladder habits.  No swelling, tenderness, numbness or tingling in her extremities at this time.  No falls or syncope to report.  She has been eating well and staying hydrated. Her weight is stable at 224 lbs.   ECOG Performance Status: 1 - Symptomatic but completely ambulatory  Medications:  Allergies as of 06/09/2021       Reactions   Calcium Channel Blockers Other (See Comments)   Acute hypotension, patient became brady/asystole < 5 seconds   Estrogens Conjugated    Other reaction(s): DVT   Nystatin Hives        Medication List        Accurate as of June 09, 2021 11:06 AM. If you have any questions, ask your nurse or doctor.          atorvastatin 20 MG tablet Commonly known as: LIPITOR Take 20 mg by mouth daily.   dofetilide 500 MCG capsule Commonly known as: TIKOSYN Take 1 capsule (500 mcg total) by mouth 2 (two) times daily.   ferrous sulfate 325 (65 FE) MG tablet Take 1 tablet (325 mg total) by mouth daily.   FreeStyle Precision Neo Test test strip Generic drug: glucose blood USE AS DIRECTED TO TEST BLOOD SUGAR DAILY E11.21   losartan 50 MG tablet Commonly known as: COZAAR Take 50 mg by mouth daily.   Magnesium Oxide 400 MG Caps Take 1 capsule (400 mg total) by mouth daily.   metFORMIN 500 MG 24 hr  tablet Commonly known as: GLUCOPHAGE-XR Take 1,500 mg by mouth at bedtime.   metoprolol succinate 50 MG 24 hr tablet Commonly known as: TOPROL-XL TAKE 1 TABLET (50 MG TOTAL) BY MOUTH DAILY. TAKE WITH OR IMMEDIATELY FOLLOWING A MEAL.   Vitamin D-3 125 MCG (5000 UT) Tabs Take 5,000 Units by mouth daily.   Xarelto 20 MG Tabs tablet Generic drug: rivaroxaban TAKE 1 TABLET (20 MG TOTAL) BY MOUTH DAILY WITH SUPPER.        Allergies:  Allergies  Allergen Reactions   Calcium Channel Blockers Other (See Comments)    Acute hypotension, patient became brady/asystole < 5 seconds   Estrogens Conjugated     Other reaction(s): DVT   Nystatin Hives    Past Medical History, Surgical history, Social history, and Family History were reviewed and updated.  Review of Systems: All other 10 point review of systems is negative.   Physical Exam:  weight is 224 lb 12.8 oz (102 kg). Her blood pressure is 92/40 (abnormal) and her pulse is 60. Her respiration is 20 and oxygen saturation is 98%.   Wt Readings from Last 3 Encounters:  06/09/21 224 lb 12.8 oz (102 kg)  02/09/21 223 lb 12.8 oz (101.5 kg)  01/05/21 225 lb (102.1 kg)  Ocular: Sclerae unicteric, pupils equal, round and reactive to light Ear-nose-throat: Oropharynx clear, dentition fair Lymphatic: No cervical or supraclavicular adenopathy Lungs no rales or rhonchi, good excursion bilaterally Heart regular rate and rhythm, no murmur appreciated Abd soft, nontender, positive bowel sounds MSK no focal spinal tenderness, no joint edema Neuro: non-focal, well-oriented, appropriate affect Breasts: Deferred   Lab Results  Component Value Date   WBC 7.0 06/09/2021   HGB 11.2 (L) 06/09/2021   HCT 34.2 (L) 06/09/2021   MCV 96.3 06/09/2021   PLT 211 06/09/2021   Lab Results  Component Value Date   FERRITIN 68 02/09/2021   IRON 75 02/09/2021   TIBC 344 02/09/2021   UIBC 269 02/09/2021   IRONPCTSAT 22 02/09/2021   Lab Results   Component Value Date   RETICCTPCT 2.4 06/09/2021   RBC 3.55 (L) 06/09/2021   No results found for: KPAFRELGTCHN, LAMBDASER, KAPLAMBRATIO No results found for: IGGSERUM, IGA, IGMSERUM No results found for: Marda Stalker, SPEI   Chemistry      Component Value Date/Time   NA 136 02/09/2021 1016   NA 137 10/10/2019 1025   K 4.6 02/09/2021 1016   CL 100 02/09/2021 1016   CO2 27 02/09/2021 1016   BUN 23 02/09/2021 1016   BUN 25 10/10/2019 1025   CREATININE 0.93 02/09/2021 1016      Component Value Date/Time   CALCIUM 9.6 02/09/2021 1016   ALKPHOS 49 02/09/2021 1016   AST 15 02/09/2021 1016   ALT 10 02/09/2021 1016   BILITOT 0.7 02/09/2021 1016       Impression and Plan: Ms. Carrie Haynes is a very pleasant 80 yo caucasian female with anemia secondary to GI blood loss while on anticoagulation.  Iron studies are pending. We will get her scheduled for replacement.  Follow-up in 4 months.  She can contact our office with any questions or concerns.   Emeline Gins, NP 8/24/202211:06 AM

## 2021-06-10 ENCOUNTER — Telehealth: Payer: Self-pay

## 2021-06-14 ENCOUNTER — Ambulatory Visit: Payer: Medicare Other | Admitting: Internal Medicine

## 2021-06-14 DIAGNOSIS — I5043 Acute on chronic combined systolic (congestive) and diastolic (congestive) heart failure: Secondary | ICD-10-CM | POA: Diagnosis not present

## 2021-06-14 DIAGNOSIS — J84112 Idiopathic pulmonary fibrosis: Secondary | ICD-10-CM | POA: Diagnosis not present

## 2021-06-15 ENCOUNTER — Other Ambulatory Visit: Payer: Self-pay

## 2021-06-15 ENCOUNTER — Inpatient Hospital Stay: Payer: Medicare Other

## 2021-06-15 VITALS — BP 112/56 | HR 59 | Resp 22

## 2021-06-15 DIAGNOSIS — D500 Iron deficiency anemia secondary to blood loss (chronic): Secondary | ICD-10-CM | POA: Diagnosis not present

## 2021-06-15 DIAGNOSIS — Z86718 Personal history of other venous thrombosis and embolism: Secondary | ICD-10-CM | POA: Diagnosis not present

## 2021-06-15 DIAGNOSIS — D509 Iron deficiency anemia, unspecified: Secondary | ICD-10-CM

## 2021-06-15 DIAGNOSIS — K922 Gastrointestinal hemorrhage, unspecified: Secondary | ICD-10-CM | POA: Diagnosis not present

## 2021-06-15 DIAGNOSIS — Z79899 Other long term (current) drug therapy: Secondary | ICD-10-CM | POA: Diagnosis not present

## 2021-06-15 DIAGNOSIS — Z7901 Long term (current) use of anticoagulants: Secondary | ICD-10-CM | POA: Diagnosis not present

## 2021-06-15 MED ORDER — SODIUM CHLORIDE 0.9 % IV SOLN
125.0000 mg | Freq: Once | INTRAVENOUS | Status: AC
  Administered 2021-06-15: 125 mg via INTRAVENOUS
  Filled 2021-06-15: qty 125

## 2021-06-15 MED ORDER — SODIUM CHLORIDE 0.9 % IV SOLN: Freq: Once | INTRAVENOUS | Status: AC

## 2021-06-15 NOTE — Patient Instructions (Signed)
Sodium Ferric Gluconate Complex injection What is this medication? SODIUM FERRIC GLUCONATE COMPLEX (SOE dee um FER ik GLOO koe nate KOM pleks) is an iron replacement. It is used with epoetin therapy to treat low iron levelsin patients who are receiving hemodialysis. This medicine may be used for other purposes; ask your health care provider orpharmacist if you have questions. COMMON BRAND NAME(S): Ferrlecit, Nulecit What should I tell my care team before I take this medication? They need to know if you have any of the following conditions: anemia that is not from iron deficiency high levels of iron in the body an unusual or allergic reaction to iron, benzyl alcohol, other medicines, foods, dyes, or preservatives pregnant or are trying to become pregnant breast-feeding How should I use this medication? This medicine is for infusion into a vein. It is given by a health careprofessional in a hospital or clinic setting. Talk to your pediatrician regarding the use of this medicine in children. While this drug may be prescribed for children as young as 6 years old for selectedconditions, precautions do apply. Overdosage: If you think you have taken too much of this medicine contact apoison control center or emergency room at once. NOTE: This medicine is only for you. Do not share this medicine with others. What if I miss a dose? It is important not to miss your dose. Call your doctor or health careprofessional if you are unable to keep an appointment. What may interact with this medication? Do not take this medicine with any of the following medications: deferoxamine dimercaprol other iron products This medicine may also interact with the following medications: chloramphenicol deferasirox medicine for blood pressure like enalapril This list may not describe all possible interactions. Give your health care provider a list of all the medicines, herbs, non-prescription drugs, or dietary  supplements you use. Also tell them if you smoke, drink alcohol, or use illegaldrugs. Some items may interact with your medicine. What should I watch for while using this medication? Your condition will be monitored carefully while you are receiving thismedicine. Visit your doctor for check-ups as directed. What side effects may I notice from receiving this medication? Side effects that you should report to your doctor or health care professionalas soon as possible: allergic reactions like skin rash, itching or hives, swelling of the face, lips, or tongue breathing problems changes in hearing changes in vision chills, flushing, or sweating fast, irregular heartbeat feeling faint or lightheaded, falls fever, flu-like symptoms high or low blood pressure pain, tingling, numbness in the hands or feet severe pain in the chest, back, flanks, or groin swelling of the ankles, feet, hands trouble passing urine or change in the amount of urine unusually weak or tired Side effects that usually do not require medical attention (report to yourdoctor or health care professional if they continue or are bothersome): cramps dark colored stools diarrhea headache nausea, vomiting stomach upset This list may not describe all possible side effects. Call your doctor for medical advice about side effects. You may report side effects to FDA at1-800-FDA-1088. Where should I keep my medication? This drug is given in a hospital or clinic and will not be stored at home. NOTE: This sheet is a summary. It may not cover all possible information. If you have questions about this medicine, talk to your doctor, pharmacist, orhealth care provider.  2022 Elsevier/Gold Standard (2008-06-04 15:58:57)  

## 2021-06-22 ENCOUNTER — Ambulatory Visit: Payer: Medicare Other | Admitting: Internal Medicine

## 2021-06-24 ENCOUNTER — Ambulatory Visit: Payer: Medicare Other | Admitting: Internal Medicine

## 2021-06-28 ENCOUNTER — Other Ambulatory Visit: Payer: Self-pay | Admitting: Cardiovascular Disease

## 2021-06-29 NOTE — Telephone Encounter (Signed)
Prescription refill request for Xarelto received.  Indication:AFIB Last office visit:CROITORU 01/05/21 Weight:102KG Age:45F Scr:0.93 02/09/21 CrCl:81.6

## 2021-07-09 ENCOUNTER — Encounter: Payer: Self-pay | Admitting: Cardiovascular Disease

## 2021-07-09 ENCOUNTER — Ambulatory Visit: Payer: Medicare Other | Admitting: Cardiovascular Disease

## 2021-07-09 ENCOUNTER — Other Ambulatory Visit: Payer: Self-pay

## 2021-07-09 VITALS — BP 118/71 | HR 60 | Ht 60.0 in | Wt 221.6 lb

## 2021-07-09 DIAGNOSIS — E1169 Type 2 diabetes mellitus with other specified complication: Secondary | ICD-10-CM

## 2021-07-09 DIAGNOSIS — I4819 Other persistent atrial fibrillation: Secondary | ICD-10-CM

## 2021-07-09 DIAGNOSIS — I2781 Cor pulmonale (chronic): Secondary | ICD-10-CM

## 2021-07-09 DIAGNOSIS — E669 Obesity, unspecified: Secondary | ICD-10-CM

## 2021-07-09 DIAGNOSIS — I480 Paroxysmal atrial fibrillation: Secondary | ICD-10-CM

## 2021-07-09 DIAGNOSIS — J8410 Pulmonary fibrosis, unspecified: Secondary | ICD-10-CM

## 2021-07-09 DIAGNOSIS — I10 Essential (primary) hypertension: Secondary | ICD-10-CM

## 2021-07-09 DIAGNOSIS — I5032 Chronic diastolic (congestive) heart failure: Secondary | ICD-10-CM | POA: Diagnosis not present

## 2021-07-09 DIAGNOSIS — Z7901 Long term (current) use of anticoagulants: Secondary | ICD-10-CM

## 2021-07-09 DIAGNOSIS — Z79899 Other long term (current) drug therapy: Secondary | ICD-10-CM

## 2021-07-09 DIAGNOSIS — Z5181 Encounter for therapeutic drug level monitoring: Secondary | ICD-10-CM

## 2021-07-09 NOTE — Progress Notes (Signed)
Cardiology Office Note:    Date:  07/09/2021   ID:  Carrie Haynes, DOB 1941-03-08, MRN [id number]  PCP:  Carrie Montana, MD  Cardiologist:  Carrie Fair, MD    Referring MD: Carrie Montana, MD   No chief complaint on file.    History of Present Illness:    Carrie Haynes is a 80 y.o. 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  She has not had any palpitations or any clinical documentation of atrial fibrillation since her last appointment.  She continues to go to the hematology center for her iron deficiency anemia.  Iron studies show borderline abnormalities (transferrin saturation around 20%, normal ferritin).    Her oxygen requirements have decreased.  She is now using O2 at 2 L/min rather than 3 L/min.  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  Previous pulmonary function tests have shown mild-moderate reduction in FVC (65-75% of predicted, DLCO corrects for alveolar volume).  She did receive amiodarone for a couple of months in 2018.  Neurologist last notes, he believes that obesity is a large part of the problem.  She denies orthopnea or PND or lower extremity edema, has NYHA functional class II dyspnea on exertion while wearing O2.  She has a chronic dry cough.  She has not had palpitations, dizziness or syncope.  Presenting rhythm today is normal sinus rhythm.  Her QTC is 416 ms.  Initially diagnosed with atrial fibrillation with rapid ventricular response in July 2018 when she presented with tachycardia cardiomyopathy.  Her echo showed mildly depressed LVEF at 45% with diffuse hypokinesis and moderate mitral regurgitation, mildly dilated left atrium, but also a moderately dilated right atrium with  moderate to severe tricuspid regurgitation and mild pulmonary hypertension.  Coronary angiography showed no evidence of significant CAD.  Rate control was difficult.  She underwent cardioversion on August 2 unsuccessfully, but eventually after loading with IV amiodarone was successfully cardioverted on August 7. Pulmonary function tests (before treatment with amiodarone) raised concern for restrictive lung disease, thereby making long-term treatment with amiodarone undesirable.  Amiodarone was stopped in November 2018. Follow-up echocardiogram shows complete normalization of left ventricular systolic function, EF 55-60%, with some residual signs of impaired relaxation.  A CT of the chest performed in July 2020 did show evidence of mild-moderate pulmonary fibrosis, but this was unchanged from the previous CT in 2018.    In November 2020 she presented with recurrent shortness of breath and was found to again have atrial fibrillation.  She was in rapid ventricular response but was unaware of the arrhythmia.  Echocardiography shows her EF has again dropped to 45-50%.  She was hospitalized for dofetilide loading, converted to sinus rhythm without electrical cardioversion during antiarrhythmic administration.     Past Medical History:  Diagnosis Date   A-fib (HCC)    Diabetes mellitus without complication (HCC)    Hyperlipemia    Hypertension     Past Surgical History:  Procedure Laterality Date   CARDIOVERSION N/A 05/18/2017   Procedure: CARDIOVERSION;  Surgeon: Carrie Stade, MD;  Location: [facility] ENDOSCOPY;  Service: Cardiovascular;  Laterality: N/A;   CARDIOVERSION N/A 05/23/2017   Procedure: CARDIOVERSION;  Surgeon: Carrie Reichert, MD;  Location: [facility] ENDOSCOPY;  Service: Cardiovascular;  Laterality: N/A;   CARDIOVERSION N/A 08/28/2019   Procedure: CARDIOVERSION;  Surgeon: Carrie Bathe, MD;  Location: [facility] ENDOSCOPY;  Service: Cardiovascular;  Laterality: N/A;   COLONOSCOPY N/A 08/07/2020    Procedure: COLONOSCOPY;  Surgeon: Carrie Modena, MD;  Location: [facility] ENDOSCOPY;  Service: Endoscopy;  Laterality: N/A;   ESOPHAGOGASTRODUODENOSCOPY N/A 08/07/2020   Procedure: ESOPHAGOGASTRODUODENOSCOPY (EGD);  Surgeon: Carrie Modena, MD;  Location: [facility] ENDOSCOPY;  Service: Endoscopy;  Laterality: N/A;   GIVENS CAPSULE STUDY N/A 08/09/2020   Procedure: GIVENS CAPSULE STUDY;  Surgeon: Carrie Modena, MD;  Location: [facility] ENDOSCOPY;  Service: Endoscopy;  Laterality: N/A;   RIGHT/LEFT HEART CATH AND CORONARY ANGIOGRAPHY N/A 05/12/2017   Procedure: Right/Left Heart Cath and Coronary Angiography;  Surgeon: Carrie Records, MD;  Location: [facility] INVASIVE CV LAB;  Service: Cardiovascular;  Laterality: N/A;   TEE WITHOUT CARDIOVERSION N/A 05/18/2017   Procedure: TRANSESOPHAGEAL ECHOCARDIOGRAM (TEE);  Surgeon: Carrie Stade, MD;  Location: [facility] ENDOSCOPY;  Service: Cardiovascular;  Laterality: N/A;    Current Medications: Current Meds  Medication Sig   atorvastatin (LIPITOR) 20 MG tablet Take 20 mg by mouth daily.   Cholecalciferol (VITAMIN D-3) 5000 units TABS Take 5,000 Units by mouth daily.   dofetilide (TIKOSYN) 500 MCG capsule Take 1 capsule (500 mcg total) by mouth 2 (two) times daily.   ferrous sulfate 325 (65 FE) MG tablet Take 1 tablet (325 mg total) by mouth daily.   FREESTYLE PRECISION NEO TEST test strip USE AS DIRECTED TO TEST BLOOD SUGAR DAILY E11.21   losartan (COZAAR) 50 MG tablet Take 50 mg by mouth daily.   metFORMIN (GLUCOPHAGE-XR) 500 MG 24 hr tablet Take 1,500 mg by mouth at bedtime.    metoprolol succinate (TOPROL-XL) 50 MG 24 hr tablet TAKE 1 TABLET (50 MG TOTAL) BY MOUTH DAILY. TAKE WITH OR IMMEDIATELY FOLLOWING A MEAL.   XARELTO 20 MG TABS tablet TAKE 1 TABLET BY MOUTH DAILY WITH SUPPER.   [DISCONTINUED] Magnesium Oxide 400 MG CAPS Take 1 capsule (400 mg total) by mouth daily.     Allergies:   Calcium channel blockers, Estrogens conjugated, and Nystatin   Social History    Socioeconomic History   Marital status: Widowed    Spouse name: Not on file   Number of children: Not on file   Years of education: Not on file   Highest education level: Not on file  Occupational History   Not on file  Tobacco Use   Smoking status: Never   Smokeless tobacco: Never  Vaping Use   Vaping Use: Never used  Substance and Sexual Activity   Alcohol use: No   Drug use: No   Sexual activity: Not on file  Other Topics Concern   Not on file  Social History Narrative   Not on file   Social Determinants of Health   Financial Resource Strain: Not on file  Food Insecurity: Not on file  Transportation Needs: Not on file  Physical Activity: Not on file  Stress: Not on file  Social Connections: Not on file     Family History: The patient's family history includes Breast cancer in an other family member; Heart attack in her sister; Hypertension in her father. ROS:   Please see the history of present illness.    All other systems are reviewed and are negative.  EKGs/Labs/Other Studies Reviewed:    The following studies were reviewed today:  ECHO 09/03/2019:  1. Left ventricular ejection fraction, by visual estimation, is 45 to 50%. ...   ECHO 10/25/2019:   1. Left ventricular ejection fraction,  by visual estimation, is 55 to  60%. The left ventricle has normal function. There is no left ventricular  hypertrophy.   2. Left ventricular diastolic parameters are consistent with Grade I  diastolic dysfunction (impaired relaxation).   3. The left ventricle has no regional wall motion abnormalities.   4. Global right ventricle has mildly reduced systolic function.The right  ventricular size is mildly enlarged. No increase in right ventricular wall  thickness.   5. Left atrial size was mildly dilated.   6. Right atrial size was normal.   7. Mild mitral annular calcification.   8. The mitral valve is normal in structure. Mild mitral valve  regurgitation. No  evidence of mitral stenosis.   9. The tricuspid valve is normal in structure.  10. The aortic valve is tricuspid. Aortic valve regurgitation is not  visualized. Mild to moderate aortic valve sclerosis/calcification without  any evidence of aortic stenosis.  11. The tricuspid regurgitant velocity is 3.35 m/s, and with an assumed  right atrial pressure of 3 mmHg, the estimated right ventricular systolic  pressure is moderately elevated at 47.9 mmHg.  12. The inferior vena cava is normal in size with greater than 50%  respiratory variability, suggesting right atrial pressure of 3 mmHg.   EKG:  EKG is ordered today.  Personally reviewed, shows normal sinus rhythm, left anterior fascicular block, normal QTC 416 ms.  Recent Labs: 07/28/2020: Pro B Natriuretic peptide (BNP) 370.0; TSH 1.77 08/11/2020: Magnesium 1.8 02/09/2021: ALT 10; BUN 23; Creatinine 0.93; Potassium 4.6; Sodium 136 06/09/2021: Hemoglobin 11.2; Platelet Count 211   06/08/2020 Hemoglobin 12.3, creatinine 1.1, potassium 5.0, normal liver function tests, TSH 2.83  Recent Lipid Panel    Component Value Date/Time   CHOL 165 10/10/2019 1025   TRIG 96 10/10/2019 1025   HDL 52 10/10/2019 1025   CHOLHDL 3.2 10/10/2019 1025   CHOLHDL 2.6 05/13/2017 0331   VLDL 10 05/13/2017 0331   LDLCALC 95 10/10/2019 1025   06/08/2020 Total cholesterol 156, HDL 53, LDL 80, triglycerides 126 Physical Exam:    VS:  BP 118/71   Pulse 60   Ht 5' (1.524 m)   Wt 100.5 kg   SpO2 95%   BMI 43.28 kg/m     Wt Readings from Last 3 Encounters:  07/09/21 100.5 kg  06/09/21 102 kg  02/09/21 101.5 kg      General: Alert, oriented x3, no distress, morbidly obese. Head: no evidence of trauma, PERRL, EOMI, no exophtalmos or lid lag, no myxedema, no xanthelasma; normal ears, nose and oropharynx Neck: normal jugular venous pulsations and no hepatojugular reflux; brisk carotid pulses without delay and no carotid bruits Chest: She has dry crackles  bilaterally that do not clear with cough; otherwise, no signs of consolidation by percussion or palpation, normal fremitus, symmetrical and full respiratory excursions Cardiovascular: normal position and quality of the apical impulse, regular rhythm, normal first and second heart sounds, no murmurs, rubs or gallops Abdomen: no tenderness or distention, no masses by palpation, no abnormal pulsatility or arterial bruits, normal bowel sounds, no hepatosplenomegaly Extremities: no clubbing, cyanosis or edema; 2+ radial, ulnar and brachial pulses bilaterally; 2+ right femoral, posterior tibial and dorsalis pedis pulses; 2+ left femoral, posterior tibial and dorsalis pedis pulses; no subclavian or femoral bruits Neurological: grossly nonfocal Psych: Normal mood and affect   ASSESSMENT:    1. Persistent atrial fibrillation (HCC)   2. Paroxysmal atrial fibrillation (HCC)   3. Chronic diastolic heart failure (HCC)   4.  Pulmonary fibrosis (HCC)   5. Cor pulmonale, chronic (HCC)   6. Essential hypertension   7. Morbid obesity due to excess calories (HCC)   8. Diabetes mellitus type 2 in obese (HCC)   9. Encounter for monitoring dofetilide therapy   10. Long term (current) use of anticoagulants     PLAN:    In order of problems listed above:  AFib: she is maintaining sinus rhythm on dofetilide.  She has biatrial dilation.  Continue anticoagulation.  No history of stroke/TIA, but CHA2DS2-VASc 5 (age 69, gender, hypertension, CHF).   CHF: Each time her ejection fraction has dropped due to persistent tachycardia, it has resolved after correction of the arrhythmia.  Physical exam is challenging due to morbid obesity, but I do not find any overt signs of hypervolemia.  NYHA functional class II, occasionally class III.  She is not on diuretics. Pulmonary fibrosis: She has bilateral dry crackles on exam today.  Etiology uncertain. Sees Dr. Sherene Sires, her next appointment is next week. She was only on amiodarone  for about 3 months and restrictive lung disease abnormalities on her PFTs were present before amiodarone was ever initiated.   Clearly some part of her restriction is due to her obesity.  Concerned about the crackles she has on exam today though. Cor pulmonale: Multifactorial (obesity, restrictive lung disease, may be OSA), no signs of right heart failure today.  Denies daytime hypersomnolence. HTN: well-controlled on current medications. Obesity: Morbid obesity is a big part of her restrictive lung disease, but should not cause her crackles. DM: Adequate control, most recent hemoglobin A1c 7% Dofetilide monitoring: QTC in normal range.  Recent potassium and renal function were normal. Anticoagulation: No recent overt bleeding, but continues to require iron infusions.  I wonder if switching her to Eliquis rather than Xarelto would be beneficial.  She has just sent off a stool sample for occult bleeding.  If this is abnormal, would recommend trying the Eliquis instead.    Medication Adjustments/Labs and Tests Ordered: Current medicines are reviewed at length with the patient today.  Concerns regarding medicines are outlined above.  Orders Placed This Encounter  Procedures   EKG 12-Lead    No orders of the defined types were placed in this encounter.  Patient Instructions  Medication Instructions:  No changes *If you need a refill on your cardiac medications before your next appointment, please call your pharmacy*   Lab Work: None ordered If you have labs (blood work) drawn today and your tests are completely normal, you will receive your results only by: MyChart Message (if you have MyChart) OR A paper copy in the mail If you have any lab test that is abnormal or we need to change your treatment, we will call you to review the results.   Testing/Procedures: None ordered   Follow-Up: At [facility], you and your health needs are our priority.  As part of our continuing mission  to provide you with exceptional heart care, we have created designated Provider Care Teams.  These Care Teams include your primary Cardiologist (physician) and Advanced Practice Providers (APPs -  Physician Assistants and Nurse Practitioners) who all work together to provide you with the care you need, when you need it.  We recommend signing up for the patient portal called "MyChart".  Sign up information is provided on this After Visit Summary.  MyChart is used to connect with patients for Virtual Visits (Telemedicine).  Patients are able to view lab/test results, encounter notes, upcoming appointments,  etc.  Non-urgent messages can be sent to your provider as well.   To learn more about what you can do with MyChart, go to [email or web address].    Your next appointment:   6 month(s)  The format for your next appointment:   In Person  Provider:   You may see Carrie Fair, MD or one of the following Advanced Practice Providers on your designated Care Team:   Azalee Course, PA-C Micah Flesher, [place] or  Judy Pimple, PA-C   Signed, Carrie Fair, MD  07/09/2021 1:51 PM    [facility]

## 2021-07-09 NOTE — Patient Instructions (Signed)

## 2021-07-11 ENCOUNTER — Other Ambulatory Visit: Payer: Self-pay | Admitting: Cardiovascular Disease

## 2021-07-14 ENCOUNTER — Encounter: Payer: Self-pay | Admitting: Internal Medicine

## 2021-07-14 ENCOUNTER — Other Ambulatory Visit: Payer: Self-pay

## 2021-07-14 ENCOUNTER — Ambulatory Visit: Payer: Medicare Other | Admitting: Internal Medicine

## 2021-07-14 VITALS — BP 118/60 | HR 59 | Temp 97.8°F | Ht 60.0 in | Wt 221.0 lb

## 2021-07-14 DIAGNOSIS — R0609 Other forms of dyspnea: Secondary | ICD-10-CM

## 2021-07-14 DIAGNOSIS — R0600 Dyspnea, unspecified: Secondary | ICD-10-CM

## 2021-07-14 DIAGNOSIS — J9611 Chronic respiratory failure with hypoxia: Secondary | ICD-10-CM

## 2021-07-14 DIAGNOSIS — Z23 Encounter for immunization: Secondary | ICD-10-CM | POA: Diagnosis not present

## 2021-07-14 NOTE — Patient Instructions (Signed)
We will walk you today on 02 to compare to the prior levels  We will schedule you a repeat CT chest and I will call you with result    Make sure you check your oxygen saturation  at your highest level of activity  to be sure it stays over 90% and adjust  02 flow upward to maintain this level if needed but remember to turn it back to previous settings when you stop (to conserve your supply).    Please schedule a follow up office visit in 6 months  call sooner if needed

## 2021-07-14 NOTE — Assessment & Plan Note (Signed)
Placed on 02 07/2020  -07/14/2021   desat   To 88% x 125 ft slow on 5lpm POC   If cannot find more treable condition than classic UIP/ IPF will need a different 02 set up and referral to PF clinici  In meantime advised:  Pacing and Make sure you check your oxygen saturation  at your highest level of activity  to be sure it stays over 90% and adjust  02 flow upward to maintain this level if needed but remember to turn it back to previous settings when you stop (to conserve your supply).   Each maintenance medication was reviewed in detail including emphasizing most importantly the difference between maintenance and prns and under what circumstances the prns are to be triggered using an action plan format where appropriate.  Total time for H and P, chart review, counseling,  directly observing portions of ambulatory 02 saturation study/ and generating customized AVS unique to this office visit / same day charting > 30 min

## 2021-07-14 NOTE — Progress Notes (Signed)
Subjective:     Patient ID: Carrie Haynes, female   DOB: Sep 07, 1941      MRN: [id number]     History of Present Illness  44 yowf never smoker with remote dx of AB but did not stay on inhalers and breathing was fine x ? Years/? Decades but since early 2018 noted doe > admitted  Admit date: 05/10/2017 Discharge date: 05/23/2017   Primary Care Provider: Laurann Montana Primary Cardiologist: Dr. Royann Shivers (per patient)   Discharge Diagnoses    Principal Problem:   Atrial fibrillation [facility]) Active Problems:   Dyspnea   Acute on chronic combined systolic and diastolic CHF (congestive heart failure) (HCC)   Asystole (HCC)   Hypotension   Atrial fibrillation with RVR (HCC)   DCM (dilated cardiomyopathy) (HCC)     Allergies      Allergies  Allergen Reactions   Calcium Channel Blockers Other (See Comments)      Acute hypotension, patient became brady/asystole < 5 seconds        History of Present Illness     Carrie Haynes is a 80 yo female with PMH of NIDDM, HTN, and HL. Reports she is followed by her PCP for chronic issues. Family hx of CAD with half sister having an MI.  Reports she has felt short of breath over the past 6 months, but recently worsened. States 2 nights ago she developed worsening dyspnea, and orthopnea.Complains of SSCP Felt like a stabbing sensation in her chest with deep inspiration  Also in between shoulder blades  Again, pleuritic.  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 Was on coumadin afterwards, and then taken off. States she had another PE afterwards, and on coumadin again. This was many years ago per her report. Pt has also had some episodes of dizziness over the past couple of weeks, mostly with walking.  Flet like she had to hold herself up with wall   Also reports some palpitations prior to admission.    Presented to the ED with worsening dyspnea. She was started on IV heparin given concern for  possible PE. CTA was negative for PE but showed evidence to support pulmonary hypertension.   Labs showed stable electrolytes, BNP 115, Hgb 10.6, I stat Trop 0.01, Mag 1.5. CXR negative for edema. EKG showed new onset Afib RVR. Placed on IV dilt and rate improved from 140s to 100s. Given 2g IV mag in the ED.      Hospital Course     Consultants: None   She underwent right and left heart catheterization on 05/12/17 with normal coronaries and with elevated filling pressures consistent with acute on chronic combined systolic and diastolic heart failure. Left ventricular systolic dysfunction was thought to be secondary to tachycardia. She was placed on PO amiodarone and underwent TEE/DCCV on 05/18/17. The cardioversion was unsuccessful. She was continued on anticoagulation (xarelto) and started on IV amiodarone with plans for repeat DCCV. Unfortunately, IV amiodarone infiltrated with bilateral thrombophlebitis in both arms, no skin necrosis. Second DCCV without TEE was performed on 05/23/17 with conversion to sinus rhythm. EKG confirms sinus rhythm.    Will continue xarelto and PO amiodarone.    Borderline BP precludes initiation of conventional heart failure medications. Will repeat echo in three months after established NSR to evaluate function. Reduced LV function may be related to Afib and tachycardia.        07/12/2017 1st [facility] office  visit/ Carrie Haynes  Re doe ? Amiodarone related  Chief Complaint  Patient presents with   Advice Only    Referred by Dr. Royann Shivers. Pt had an abnormal PFT 05/19/17. Pt admitted to hospital 7/25-8/7 due to atrial fibrillation and was unable to breathe. Pt currently complains of occ. cough and SOB on exertion.  doe x MMRC2 = can't walk a nl pace on a flat grade s sob but does fine slow and flat eg shopping / leaning on cart  When last had nl activity tol wt =  250 and despite wt loss has lost ground with doe over same time frame = about 8 months and much better since  admit but not back to prev baseline Also cough x years sporadic and dry and notes at hs and in am > rest of the day  but no worse on amio Sleeps ok on one pillow  rec Add pepcid 20 mg one hour before bed GERD   Monitor your oxygen level with regular paced walking and let me and Dr C know right away if trending down over several days or weeks (one or two days is not a true "trend" so keep monitoring as regularly as you can)     D/C amiodarone around 07/17/17   10/23/2017  f/u ov/Carrie Haynes re: doe  @ 221  Chief Complaint  Patient presents with   Follow-up    PFT's done today. She states her breathing is back to her normal baseline and she is no longer coughing. No new co's.   sleeps horizontal R side down  Doe improved/ not tracking sats though and vague about what activities she actually attempts to do at this point rec Keep up the walking and monitor your  0xygen saturations and call for follow up appt if you see a downward trend from this point walking at a slower pace than what you did today. Pulmonary follow up can be as needed    07/28/2020  f/u ov/Carrie Haynes re: steady downhill with breathing and coughing x 2018 Chief Complaint  Patient presents with   Follow-up    DOE  Dyspnea:  Maybe an aisle or two at [employer] slowly = MMRC3 = can't walk 100 yards even at a slow pace at a flat grade s stopping due to sob  Cough: all day and when lie down then goes away p 5-10 min  and doesn't wake her  Sleeping: ok on R side flat bed one pillow  SABA use: none 02: none occ brb per rectum  Streaky  on stools while maint  on eliquis rec Check your medications when you get home to be sure they correlate with AVS and call with any discrepancies  Zyrtec 10 mg  One hour before bedtime (over the counter)  Omeprazole/Prilosec  should be 40 mg Take 30- 60 min before your first and last meals of the day  GERD diet  Please remember to go to the lab and x-ray department   for your tests - we will call you with  the results when they are available.   Please schedule a follow up office visit in 4 weeks, sooner if needed  Late add :  normocyctic anemia/ streaky brbpr on xarelto will need urgent GI eval and if worsens stop xarelto or go to ER     09/08/2020  f/u ov/Carrie Haynes re:  Worse breathing x 3 y   - anemia at last w/u with desats p 250 ft so now on continuous 02  but not checking sats  Chief Complaint  Patient presents with   Follow-up    Breathing is much improved since the last visit. She was hospitalized with anemia 08/05/20-08/11/20 and sent home on o2 3lpm 24/7.   Dyspnea:  Extremely sedentary  Cough: resolved to her satisfaction  Sleeping: flat bed/ one pillow  SABA use: none 02: 3lpm sleep,  3lpm at rest,  3lpm pulsed with ex but not checking pulse yet  rec Make sure you check your oxygen saturations at highest level of activity to be sure it stays over 90%  Weight control is simply a matter of calorie balance which needs to be tilted in your favor by eating less and exercising more.   Please schedule a follow up visit in 3 months but call sooner if needed    12/14/2020  f/u ov/Carrie Haynes re: chronic resp failure p amiodarone last rx 2018  Chief Complaint  Patient presents with   Follow-up    Seem to be improving  Dyspnea:  avg pace, leaning on cart/ [employer] on 3lpm POC / hc parking  Cough:  occ feels pnds  Sleeping: flat bed one pillow  SABA use: none 02: 3lpm hs and poc 3lplm but not titrating   Covid status:  vaxed to max  Rec Make sure you check your oxygen saturation  at your highest level of activity     07/14/2021  f/u ov/Carrie Haynes re: 02 dep resp failure p amiodarone   maint on 02 no other  rx   Chief Complaint  Patient presents with   Follow-up    Breathing is overall doing well.    Dyspnea:  leans on cart at [employer] on 2lpm POC but not checking  sats as rec  Cough: none  Sleeping: flat bed one pillow SABA use: none  02: 2lpm hs resting well no am ha  Covid  status:   vax x 4    No obvious day to day or daytime variability or assoc excess/ purulent sputum or mucus plugs or hemoptysis or cp or chest tightness, subjective wheeze or overt sinus or hb symptoms.   Sleeping as above  without nocturnal  or early am exacerbation  of respiratory  c/o's or need for noct saba. Also denies any obvious fluctuation of symptoms with weather or environmental changes or other aggravating or alleviating factors except as outlined above   No unusual exposure hx or h/o childhood pna/ asthma or knowledge of premature birth.  Current Allergies, Complete Past Medical History, Past Surgical History, Family History, and Social History were reviewed in [employer] record.  ROS  The following are not active complaints unless bolded Hoarseness, sore throat, dysphagia, dental problems, itching, sneezing,  nasal congestion or discharge of excess mucus or purulent secretions, ear ache,   fever, chills, sweats, unintended wt loss or wt gain, classically pleuritic or exertional cp,  orthopnea pnd or arm/hand swelling  or leg swelling, presyncope, palpitations, abdominal pain, anorexia, nausea, vomiting, diarrhea  or change in bowel habits or change in bladder habits, change in stools or change in urine, dysuria, hematuria,  rash, arthralgias, visual complaints, headache, numbness, weakness or ataxia or problems with walking or coordination,  change in mood or  memory.        Current Meds  Medication Sig   atorvastatin (LIPITOR) 20 MG tablet Take 20 mg by mouth daily.   Cholecalciferol (VITAMIN D-3) 5000 units TABS Take 5,000 Units by mouth daily.  dofetilide (TIKOSYN) 500 MCG capsule Take 1 capsule (500 mcg total) by mouth 2 (two) times daily.   FREESTYLE PRECISION NEO TEST test strip USE AS DIRECTED TO TEST BLOOD SUGAR DAILY E11.21   losartan (COZAAR) 50 MG tablet Take 50 mg by mouth daily.   metFORMIN (GLUCOPHAGE-XR) 500 MG 24 hr tablet Take 1,500 mg by  mouth at bedtime.    metoprolol succinate (TOPROL-XL) 50 MG 24 hr tablet TAKE 1 TABLET BY MOUTH DAILY. TAKE WITH OR IMMEDIATELY FOLLOWING A MEAL.   XARELTO 20 MG TABS tablet TAKE 1 TABLET BY MOUTH DAILY WITH SUPPER.               Objective:   Physical Exam   07/14/2021       221  12/14/2020       223  09/08/2020     228  07/28/2020     229  10/23/2017         221   07/12/17 213 lb 9.6 oz (96.9 kg)  05/31/17 215 lb 9.6 oz (97.8 kg)  05/23/17 220 lb (99.8 kg)      Vital signs reviewed  07/14/2021  - Note at rest 02 sats  94% on 2lpm pulsed    General appearance:    amb obese wf       Obese amb wf nad    HEENT : pt wearing mask not removed for exam due to covid -19 concerns.    NECK :  without JVD/Nodes/TM/ nl carotid upstrokes bilaterally   LUNGS: no acc muscle use,  Nl contour chest with distant crackles in bases  bilaterally without cough on insp or exp maneuvers   CV:  RRR  no s3 or murmur or increase in P2, and no edema   ABD: obese  soft and nontender with nl inspiratory excursion in the supine position. No bruits or organomegaly appreciated, bowel sounds nl  MS:  Nl gait/ ext warm without deformities, calf tenderness, cyanosis or clubbing No obvious joint restrictions   SKIN: warm and dry without lesions    NEURO:  alert, approp, nl sensorium with  no motor or cerebellar deficits apparent.                    Assessment:

## 2021-07-14 NOTE — Assessment & Plan Note (Signed)
Onset 2018 -PFT's  05/22/2017  FVC 1.54 (64%) no obst s rx prior to study with DLCO  49/56 % corrects to 106  % for alv volume   - Amiodarone rx 05/18/17 > 07/17/17  - 07/12/2017  Walked RA x 3 laps @ 185 ft each stopped due to  End of study, fast pace, desat to 88 but held steady there and only mild sob  - ESR 07/12/2017  = 46   PFT's  10/23/2017  FVC 1.80 (75%) no obst with dlco 49/51c and  DLCO 83% for alv vol  - 10/23/2017  Walked RA x 3 laps @ 185 ft each stopped due to  End of study, fast pace,  89% after first and 86% at very end  HRCT chest  04/18/2019 probable mild to moderate pulmonary fibrosis that is not well characterized given limitations of the exam but likely in a "possible UIP" pattern   Again within limitations of comparison, this does not appear significantly changed compared to CT dated 05/10/2017 rec f/u with serial 02 sats with ex to monitor for progression > no dedicated f/u needed  - 07/28/2020   Walked on RA x one lap =  approx 250 ft -@ avg pace stopped due to sob with sats down to 87% (while anemic) > placed on 02 (see chronic resp failure)   - 07/14/2021   Walked on 5lpm POC x   approx 125 ft @ slow  pace, stopped due to sob with lowest 02 sats 88% - repeat HRCT ordered  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

## 2021-07-15 DIAGNOSIS — J84112 Idiopathic pulmonary fibrosis: Secondary | ICD-10-CM | POA: Diagnosis not present

## 2021-07-15 DIAGNOSIS — I5043 Acute on chronic combined systolic (congestive) and diastolic (congestive) heart failure: Secondary | ICD-10-CM | POA: Diagnosis not present

## 2021-07-20 ENCOUNTER — Other Ambulatory Visit: Payer: Self-pay

## 2021-07-20 ENCOUNTER — Ambulatory Visit (INDEPENDENT_AMBULATORY_CARE_PROVIDER_SITE_OTHER): Payer: Medicare Other

## 2021-07-20 DIAGNOSIS — J84112 Idiopathic pulmonary fibrosis: Secondary | ICD-10-CM | POA: Diagnosis not present

## 2021-07-20 DIAGNOSIS — I700 Atherosclerosis of aorta: Secondary | ICD-10-CM | POA: Diagnosis not present

## 2021-07-20 DIAGNOSIS — R0602 Shortness of breath: Secondary | ICD-10-CM

## 2021-07-20 DIAGNOSIS — J849 Interstitial pulmonary disease, unspecified: Secondary | ICD-10-CM | POA: Diagnosis not present

## 2021-07-20 DIAGNOSIS — R0609 Other forms of dyspnea: Secondary | ICD-10-CM | POA: Diagnosis not present

## 2021-07-20 DIAGNOSIS — J479 Bronchiectasis, uncomplicated: Secondary | ICD-10-CM | POA: Diagnosis not present

## 2021-07-21 ENCOUNTER — Encounter: Payer: Self-pay | Admitting: Internal Medicine

## 2021-07-21 DIAGNOSIS — J8410 Pulmonary fibrosis, unspecified: Secondary | ICD-10-CM | POA: Insufficient documentation

## 2021-07-22 NOTE — Progress Notes (Signed)
Spoke with the pt and notified of results. I scheduled her with Dr Isaiah Serge for 08/06/21. ILD questionnaire mailed to her home address which I have verified.

## 2021-08-06 ENCOUNTER — Other Ambulatory Visit: Payer: Self-pay

## 2021-08-06 ENCOUNTER — Ambulatory Visit: Payer: Medicare Other | Admitting: Pulmonary Disease

## 2021-08-06 ENCOUNTER — Encounter: Payer: Self-pay | Admitting: Pulmonary Disease

## 2021-08-06 VITALS — BP 126/76 | HR 62 | Temp 97.8°F | Ht 60.0 in | Wt 219.0 lb

## 2021-08-06 DIAGNOSIS — J849 Interstitial pulmonary disease, unspecified: Secondary | ICD-10-CM

## 2021-08-06 DIAGNOSIS — R0600 Dyspnea, unspecified: Secondary | ICD-10-CM | POA: Diagnosis not present

## 2021-08-06 NOTE — Patient Instructions (Addendum)
We will get some labs for further evaluation of interstitial lung disease Check comprehensive metabolic panel, CBC, proBNP for dyspnea Started paperwork for Esbriet  Follow-up in 1 to 2 months

## 2021-08-06 NOTE — Progress Notes (Signed)
Carrie Haynes    [id number]    [birth year]  Primary Care Physician:White, Aram Beecham, MD  Referring Physician: Laurann Montana, MD [phone] [place],  [place] [address]  Chief complaint: Consult for interstitial lung disease  HPI: 80 year old with history of hypertension, hyperlipidemia, atrial fibrillation Referred for evaluation of pulmonary fibrosis  She has CT scans showing progressive probable UIP pattern with worsening dyspnea.  She was started on supplemental oxygen 1 year ago Has chronic cough.  Denies any wheezing, sputum production, fevers, chills  History significant for atrial fibrillation for which she was on Xarelto.  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  No feather pillows or comforter ILD questionnaire 08/06/2021-negative Smoking history: Never smoker Travel history: No significant travel history Relevant family history: No family history of lung disease  Outpatient Encounter Medications as of 08/06/2021  Medication Sig   atorvastatin (LIPITOR) 20 MG tablet Take 20 mg by mouth daily.   Cholecalciferol (VITAMIN D-3) 5000 units TABS Take 5,000 Units by mouth daily.   dofetilide (TIKOSYN) 500 MCG capsule Take 1 capsule (500 mcg total) by mouth 2 (two) times daily.   FREESTYLE PRECISION NEO TEST test strip USE AS DIRECTED TO TEST BLOOD SUGAR DAILY E11.21   losartan (COZAAR) 50 MG tablet Take 50 mg by mouth daily.   metFORMIN (GLUCOPHAGE-XR) 500 MG 24 hr tablet Take 1,500 mg by mouth at bedtime.    metoprolol succinate (TOPROL-XL) 50 MG 24 hr tablet TAKE 1 TABLET BY MOUTH DAILY. TAKE WITH OR IMMEDIATELY FOLLOWING A MEAL.   XARELTO 20 MG TABS tablet TAKE 1 TABLET BY MOUTH DAILY WITH SUPPER.   No facility-administered encounter medications on file as of 08/06/2021.    Allergies as of 08/06/2021 - Review Complete  08/06/2021  Allergen Reaction Noted   Calcium channel blockers Other (See Comments) 05/17/2017   Estrogens conjugated  12/09/2020   Nystatin Hives 12/09/2020    Past Medical History:  Diagnosis Date   A-fib (HCC)    Diabetes mellitus without complication (HCC)    Hyperlipemia    Hypertension     Past Surgical History:  Procedure Laterality Date   CARDIOVERSION N/A 05/18/2017   Procedure: CARDIOVERSION;  Surgeon: Wendall Stade, MD;  Location: [facility] ENDOSCOPY;  Service: Cardiovascular;  Laterality: N/A;   CARDIOVERSION N/A 05/23/2017   Procedure: CARDIOVERSION;  Surgeon: Quintella Reichert, MD;  Location: [facility] ENDOSCOPY;  Service: Cardiovascular;  Laterality: N/A;   CARDIOVERSION N/A 08/28/2019   Procedure: CARDIOVERSION;  Surgeon: Jake Bathe, MD;  Location: [facility] ENDOSCOPY;  Service: Cardiovascular;  Laterality: N/A;   COLONOSCOPY N/A 08/07/2020   Procedure: COLONOSCOPY;  Surgeon: Willis Modena, MD;  Location: [facility] ENDOSCOPY;  Service: Endoscopy;  Laterality: N/A;   ESOPHAGOGASTRODUODENOSCOPY N/A 08/07/2020   Procedure: ESOPHAGOGASTRODUODENOSCOPY (EGD);  Surgeon: Willis Modena, MD;  Location: [facility] ENDOSCOPY;  Service: Endoscopy;  Laterality: N/A;   GIVENS CAPSULE STUDY N/A 08/09/2020   Procedure: GIVENS CAPSULE STUDY;  Surgeon: Willis Modena, MD;  Location: [facility] ENDOSCOPY;  Service: Endoscopy;  Laterality: N/A;   RIGHT/LEFT HEART CATH AND CORONARY ANGIOGRAPHY N/A 05/12/2017   Procedure: Right/Left Heart Cath and Coronary Angiography;  Surgeon: Lyn Records, MD;  Location: [facility] INVASIVE CV LAB;  Service: Cardiovascular;  Laterality: N/A;   TEE WITHOUT CARDIOVERSION N/A 05/18/2017   Procedure: TRANSESOPHAGEAL ECHOCARDIOGRAM (TEE);  Surgeon: Eden Emms,  Noralyn Pick, MD;  Location: [facility] ENDOSCOPY;  Service: Cardiovascular;  Laterality: N/A;    Family History  Problem Relation Age of Onset   Hypertension Father    Heart attack Sister    Breast cancer Other     Social History   Socioeconomic History    Marital status: Widowed    Spouse name: Not on file   Number of children: Not on file   Years of education: Not on file   Highest education level: Not on file  Occupational History   Not on file  Tobacco Use   Smoking status: Never   Smokeless tobacco: Never  Vaping Use   Vaping Use: Never used  Substance and Sexual Activity   Alcohol use: No   Drug use: No   Sexual activity: Not on file  Other Topics Concern   Not on file  Social History Narrative   Not on file   Social Determinants of Health   Financial Resource Strain: Not on file  Food Insecurity: Not on file  Transportation Needs: Not on file  Physical Activity: Not on file  Stress: Not on file  Social Connections: Not on file  Intimate Partner Violence: Not on file    Review of systems: Review of Systems  Constitutional: Negative for fever and chills.  HENT: Negative.   Eyes: Negative for blurred vision.  Respiratory: as per HPI  Cardiovascular: Negative for chest pain and palpitations.  Gastrointestinal: Negative for vomiting, diarrhea, blood per rectum. Genitourinary: Negative for dysuria, urgency, frequency and hematuria.  Musculoskeletal: Negative for myalgias, back pain and joint pain.  Skin: Negative for itching and rash.  Neurological: Negative for dizziness, tremors, focal weakness, seizures and loss of consciousness.  Endo/Heme/Allergies: Negative for environmental allergies.  Psychiatric/Behavioral: Negative for depression, suicidal ideas and hallucinations.  All other systems reviewed and are negative.  Physical Exam: Blood pressure 126/76, pulse 62, temperature 97.8 F (36.6 C), temperature source Oral, height 5' (1.524 m), weight 219 lb (99.3 kg), SpO2 93 %. Gen:      No acute distress HEENT:  EOMI, sclera anicteric Neck:     No masses; no thyromegaly Lungs:    Clear to auscultation bilaterally; normal respiratory effort CV:         Regular rate and rhythm; no murmurs Abd:      + bowel  sounds; soft, non-tender; no palpable masses, no distension Ext:    No edema; adequate peripheral perfusion Skin:      Warm and dry; no rash Neuro: alert and oriented x 3 Psych: normal mood and affect  Data Reviewed: Imaging: CT high-resolution 07/20/2021- Worsening of interstitial lung disease and probable UIP pattern, dilatation of pulmonary trunk, moderate cardiomegaly, atherosclerosis and three-vessel coronary disease I have reviewed the images personally.  PFTs: 10/23/2017 FVC 1.76 [74%], FEV1 1.66 [93%], F/F 94, TLC 2.65 [59%], DLCO 9.34 [49%] Moderate restriction, severe diffusion defect  Labs:  Assessment:  Pulmonary fibrosis.   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  No significant CTD symptoms are exposures.  High probability for being IPF  Check CTD panels, baseline comprehensive metabolic panel, CBC and proBNP. Discussed treatment options with patient including antifibrotic's.  Extensively discussed side effect profile but tolerating has been today We have decided to start Esbriet.  Avoid Ofev as she has diarrhea at baseline  Plan/Recommendations: CTD labs CMP, CBC, proBNP Paperwork for Esbriet  Chilton Greathouse MD  Pulmonary and Critical Care 08/06/2021, 4:00 PM  CC: Laurann Montana, MD

## 2021-08-10 LAB — CBC WITH DIFFERENTIAL/PLATELET
Absolute Monocytes: 545 cells/uL (ref 200–950)
Basophils Absolute: 28 cells/uL (ref 0–200)
Basophils Relative: 0.4 %
Eosinophils Absolute: 69 cells/uL (ref 15–500)
Eosinophils Relative: 1 %
HCT: 35 % (ref 35.0–45.0)
Hemoglobin: 11.4 g/dL — ABNORMAL LOW (ref 11.7–15.5)
Lymphs Abs: 1594 cells/uL (ref 850–3900)
MCH: 31.1 pg (ref 27.0–33.0)
MCHC: 32.6 g/dL (ref 32.0–36.0)
MCV: 95.4 fL (ref 80.0–100.0)
MPV: 11 fL (ref 7.5–12.5)
Monocytes Relative: 7.9 %
Neutro Abs: 4664 cells/uL (ref 1500–7800)
Neutrophils Relative %: 67.6 %
Platelets: 261 10*3/uL (ref 140–400)
RBC: 3.67 10*6/uL — ABNORMAL LOW (ref 3.80–5.10)
RDW: 12.5 % (ref 11.0–15.0)
Total Lymphocyte: 23.1 %
WBC: 6.9 10*3/uL (ref 3.8–10.8)

## 2021-08-10 LAB — COMPREHENSIVE METABOLIC PANEL
AG Ratio: 1.4 (calc) (ref 1.0–2.5)
ALT: 12 U/L (ref 6–29)
AST: 16 U/L (ref 10–35)
Albumin: 4 g/dL (ref 3.6–5.1)
Alkaline phosphatase (APISO): 47 U/L (ref 37–153)
BUN: 20 mg/dL (ref 7–25)
CO2: 24 mmol/L (ref 20–32)
Calcium: 9 mg/dL (ref 8.6–10.4)
Chloride: 103 mmol/L (ref 98–110)
Creat: 0.9 mg/dL (ref 0.60–1.00)
Globulin: 2.9 g/dL (calc) (ref 1.9–3.7)
Glucose, Bld: 121 mg/dL — ABNORMAL HIGH (ref 65–99)
Potassium: 4.6 mmol/L (ref 3.5–5.3)
Sodium: 137 mmol/L (ref 135–146)
Total Bilirubin: 1 mg/dL (ref 0.2–1.2)
Total Protein: 6.9 g/dL (ref 6.1–8.1)

## 2021-08-10 LAB — SJOGREN'S SYNDROME ANTIBODS(SSA + SSB)
SSA (Ro) (ENA) Antibody, IgG: <1 AI
SSB (La) (ENA) Antibody, IgG: <1 AI

## 2021-08-10 LAB — ANA,IFA RA DIAG PNL W/RFLX TIT/PATN
Anti Nuclear Antibody (ANA): NEGATIVE
Cyclic Citrullin Peptide Ab: <16 UNITS
Rheumatoid fact SerPl-aCnc: <14 IU/mL (ref <14)

## 2021-08-10 LAB — ANTI-SCLERODERMA ANTIBODY: Scleroderma (Scl-70) (ENA) Antibody, IgG: <1 AI

## 2021-08-11 ENCOUNTER — Telehealth: Payer: Self-pay

## 2021-08-11 LAB — HYPERSENSITIVITY PNEUMONITIS
A. Pullulans Abs: NEGATIVE
A.Fumigatus #1 Abs: NEGATIVE
Micropolyspora faeni, IgG: NEGATIVE
Pigeon Serum Abs: NEGATIVE
Thermoact. Saccharii: NEGATIVE
Thermoactinomyces vulgaris, IgG: NEGATIVE

## 2021-08-11 LAB — PRO B NATRIURETIC PEPTIDE: NT-Pro BNP: 742 pg/mL — ABNORMAL HIGH (ref 0–738)

## 2021-08-11 NOTE — Telephone Encounter (Signed)
Received New start paperwork for ESBRIET. Will update as we work through the benefits process.  Submitted a Prior Authorization request to [facility] MEDICARE for ESBRIET via CoverMyMeds. Will update once we receive a response.   Key: [id number]

## 2021-08-12 ENCOUNTER — Other Ambulatory Visit (HOSPITAL_COMMUNITY): Payer: Self-pay

## 2021-08-12 ENCOUNTER — Encounter: Payer: Self-pay | Admitting: Family

## 2021-08-12 NOTE — Telephone Encounter (Signed)
Submitted a Prior Authorization request to [facility] MEDICARE for PIRFENIDONE via CoverMyMeds. Will update once we receive a response.   Key: Carrie Haynes

## 2021-08-12 NOTE — Telephone Encounter (Addendum)
Received notification from [facility] regarding a prior authorization for PIRFENIDONE. Authorization has been APPROVED from 08/12/2021 to 08/12/2022.   Per test claim, copay for 30 days supply is $1,849.58  Patient can fill through [facility]: [phone]   Authorization # [id number]   Will await faxed approval letter to submit with PAP application.

## 2021-08-12 NOTE — Telephone Encounter (Addendum)
Received a fax regarding Prior Authorization from [facility] MEDICARE for ESBRIET. Authorization has been DENIED because "at least three alternative formulary medications in the same drug class or category (including the generic equivalent, if available) have not been tried. In this case, there are two alternative medications that must be tried per the member's formulary: generic PIRFENIDONE 267MG  TABS  and ESBRIET 267MG  CAPS. Please note: these medications require prior authorization review."  Will submit PA for PIRFENIDONE via CoverMyMeds.

## 2021-08-12 NOTE — Telephone Encounter (Signed)
Submitted Patient Assistance Application to [employer] for ESBRIET along with provider portion, PA and income documents. Will update patient when we receive a response.  Fax# [phone] Phone# [phone] 

## 2021-08-14 DIAGNOSIS — I5043 Acute on chronic combined systolic (congestive) and diastolic (congestive) heart failure: Secondary | ICD-10-CM | POA: Diagnosis not present

## 2021-08-14 DIAGNOSIS — J84112 Idiopathic pulmonary fibrosis: Secondary | ICD-10-CM | POA: Diagnosis not present

## 2021-08-17 ENCOUNTER — Telehealth: Payer: Self-pay | Admitting: Internal Medicine

## 2021-08-19 LAB — MYOMARKER 3 PLUS PROFILE (RDL)

## 2021-08-20 NOTE — Telephone Encounter (Signed)
Reached out to [employer] and confirmed that application is still in-process. Verified that all relevant documents were received successfully. Will continue to f/u. 

## 2021-08-25 DIAGNOSIS — H40013 Open angle with borderline findings, low risk, bilateral: Secondary | ICD-10-CM | POA: Diagnosis not present

## 2021-08-25 DIAGNOSIS — H43813 Vitreous degeneration, bilateral: Secondary | ICD-10-CM | POA: Diagnosis not present

## 2021-08-25 DIAGNOSIS — E119 Type 2 diabetes mellitus without complications: Secondary | ICD-10-CM | POA: Diagnosis not present

## 2021-08-25 DIAGNOSIS — H2589 Other age-related cataract: Secondary | ICD-10-CM | POA: Diagnosis not present

## 2021-08-25 NOTE — Telephone Encounter (Signed)
Per [employer] portal, pt has been approved for ESBRIET patient assistance beginning 08/20/2021. Pt will remain active until therapy is discontinued, there are any significant changes to pt's health insurance or financial status, or pt no longer meets program eligibility requirements.   ATC to pt and LVM with general information. Requested that if patient has not been in contact with anyone regarding approval status and/or medication delivery to please reach out to [place] here at the clinic, otherwise nothing further is needed. Approval letter printed from portal and sent to scan center along with application.

## 2021-08-31 ENCOUNTER — Telehealth: Payer: Self-pay | Admitting: Pharmacist

## 2021-08-31 DIAGNOSIS — Z5181 Encounter for therapeutic drug level monitoring: Secondary | ICD-10-CM

## 2021-08-31 DIAGNOSIS — J849 Interstitial pulmonary disease, unspecified: Secondary | ICD-10-CM

## 2021-08-31 MED ORDER — PIRFENIDONE 801 MG PO TABS: 801.0000 mg | ORAL_TABLET | Freq: Three times a day (TID) | ORAL | 1 refills | Status: DC

## 2021-08-31 MED ORDER — PIRFENIDONE 267 MG PO TABS: ORAL_TABLET | ORAL | 0 refills | Status: DC

## 2021-08-31 NOTE — Telephone Encounter (Addendum)
Subjective:  Patient called with [facility] pharmacy team for Esbriet new start.   Patient was last seen and referred by Dr. Vaughan Browner on 08/06/21.  Pertinent past medical history includes Afib, CHF, HTN, pulmonary fibrosis, DM.  History of elevated LFTs: No History of diarrhea, nausea, vomiting: Yes  Objective: Allergies  Allergen Reactions   Calcium Channel Blockers Other (See Comments)    Acute hypotension, patient became brady/asystole < 5 seconds   Estrogens Conjugated     Other reaction(s): DVT   Nystatin Hives   Outpatient Encounter Medications as of 08/31/2021  Medication Sig   atorvastatin (LIPITOR) 20 MG tablet Take 20 mg by mouth daily.   Cholecalciferol (VITAMIN D-3) 5000 units TABS Take 5,000 Units by mouth daily.   dofetilide (TIKOSYN) 500 MCG capsule Take 1 capsule (500 mcg total) by mouth 2 (two) times daily.   FREESTYLE PRECISION NEO TEST test strip USE AS DIRECTED TO TEST BLOOD SUGAR DAILY E11.21   losartan (COZAAR) 50 MG tablet Take 50 mg by mouth daily.   metFORMIN (GLUCOPHAGE-XR) 500 MG 24 hr tablet Take 1,500 mg by mouth at bedtime.    metoprolol succinate (TOPROL-XL) 50 MG 24 hr tablet TAKE 1 TABLET BY MOUTH DAILY. TAKE WITH OR IMMEDIATELY FOLLOWING A MEAL.   XARELTO 20 MG TABS tablet TAKE 1 TABLET BY MOUTH DAILY WITH SUPPER.   No facility-administered encounter medications on file as of 08/31/2021.     Immunization History  Administered Date(s) Administered   Fluad Quad(high Dose 65+) 07/14/2021   Influenza-Unspecified 07/17/2017, 07/17/2018, 07/15/2020   PFIZER(Purple Top)SARS-COV-2 Vaccination 11/07/2019, 11/28/2019, 07/15/2020, 06/11/2021    PFT's TLC  Date Value Ref Range Status  10/23/2017 2.65 L Final    CMP     Component Value Date/Time   NA 137 08/06/2021 1642   NA 137 10/10/2019 1025   K 4.6 08/06/2021 1642   CL 103 08/06/2021 1642   CO2 24 08/06/2021 1642   GLUCOSE 121 (H) 08/06/2021 1642   BUN 20 08/06/2021 1642   BUN 25  10/10/2019 1025   CREATININE 0.90 08/06/2021 1642   CALCIUM 9.0 08/06/2021 1642   PROT 6.9 08/06/2021 1642   PROT 6.6 08/21/2019 1215   ALBUMIN 4.0 02/09/2021 1016   ALBUMIN 4.0 08/21/2019 1215   AST 16 08/06/2021 1642   AST 15 02/09/2021 1016   ALT 12 08/06/2021 1642   ALT 10 02/09/2021 1016   ALKPHOS 49 02/09/2021 1016   BILITOT 1.0 08/06/2021 1642   BILITOT 0.7 02/09/2021 1016   GFRNONAA >60 02/09/2021 1016   GFRAA >60 01/21/2020 1355    CBC    Component Value Date/Time   WBC 6.9 08/06/2021 1642   RBC 3.67 (L) 08/06/2021 1642   HGB 11.4 (L) 08/06/2021 1642   HGB 11.2 (L) 06/09/2021 1032   HGB 11.3 08/21/2019 1215   HCT 35.0 08/06/2021 1642   HCT 34.4 08/21/2019 1215   PLT 261 08/06/2021 1642   PLT 211 06/09/2021 1032   PLT 217 08/21/2019 1215   MCV 95.4 08/06/2021 1642   MCV 93 08/21/2019 1215   MCH 31.1 08/06/2021 1642   MCHC 32.6 08/06/2021 1642   RDW 12.5 08/06/2021 1642   RDW 12.8 08/21/2019 1215   LYMPHSABS 1,594 08/06/2021 1642   MONOABS 0.6 06/09/2021 1032   EOSABS 69 08/06/2021 1642   BASOSABS 28 08/06/2021 1642    LFT's Hepatic Function Latest Ref Rng & Units 08/06/2021 02/09/2021 11/11/2020  Total Protein 6.1 - 8.1 g/dL 6.9 7.1 7.3  Albumin 3.5 - 5.0 g/dL - 4.0 4.1  AST 10 - 35 U/L 16 15 15   ALT 6 - 29 U/L 12 10 8   Alk Phosphatase 38 - 126 U/L - 49 56  Total Bilirubin 0.2 - 1.2 mg/dL 1.0 0.7 0.8  Bilirubin, Direct 0.1 - 0.5 mg/dL - - -    HRCT (Marked worsening interstitial lung disease, with a spectrum of findings that is still categorized as probable usual interstitial pneumonia (UIP) per current ATS guidelines, although an alternative diagnosis of chronic hypersensitivity pneumonitis should also be considered.)  Assessment and Plan  Esbriet Medication Management Thoroughly counseled patient on the efficacy, mechanism of action, dosing, administration, adverse effects, and monitoring parameters of Esbriet.  Patient verbalized understanding.  Patient education handout provided.   Goals of Therapy: Will not stop or reverse the progression of ILD. It will slow the progression of ILD.   We reviewed that Esbriet is FDA-approved and is not a "guinea-pig" medication. Reviewed that HRCT will allow [place] to see if medication has slowed progression  Dosing: Starting dose will be Esbriet 267 mg 1 tablet three times daily for 7 days, then 2 tablets three times daily for 7 days, then 3 tablets three times daily.  Maintenance dose will be 801 mg 1 tablet three times daily if tolerated.  Stressed the importance of taking with meals and space at least 5-6 hours apart to minimize stomach upset.   Adverse Effects: Nausea, vomiting, diarrhea, weight loss Abdominal pain GERD Sun sensitivity/rash - patient advised to wear sunscreen when exposed to sunlight Dizziness Fatigue She does express some concern with side effects and tablet burden. I reviewed that if she is stable on 3 tabs three times daily, we can reduce pill burden to higher strength 1 tab three times daily. She expressed some relief with this information.  Monitoring: Monitor for diarrhea, nausea and vomiting, GI perforation, hepatotoxicity  Monitor LFTs - baseline, monthly for first 6 months, then every 3 months routinely CBC w differential at baseline and every 3 months routinely  Access: Approval of Esbriet through: patient assistance Rx sent to: [facility], dispensing) for [place]: [phone]  Will receive first shipment 09/02/21. Follow-up scheduled 09/16/21 with Dr. Vaughan Browner  Medication Reconciliation A drug regimen assessment was performed, including review of allergies, interactions, disease-state management, dosing and immunization history. Medications were reviewed with the patient, including name, instructions, indication, goals of therapy, potential side effects, importance of adherence, and safe use.  Immunizations Patient has received 5 COVID19 vaccines.  This  appointment required 30 minutes of patient care (this includes precharting, chart review, review of results, face-to-face care, etc.).  Thank you for involving pharmacy to assist in providing this patient's care.  Michela Pitcher) Skyy Mcknight, PharmD Student  [place] [facility], PharmD, MPH, BCPS Clinical Pharmacist (Rheumatology and Pulmonology)

## 2021-09-14 DIAGNOSIS — J84112 Idiopathic pulmonary fibrosis: Secondary | ICD-10-CM | POA: Diagnosis not present

## 2021-09-14 DIAGNOSIS — I5043 Acute on chronic combined systolic (congestive) and diastolic (congestive) heart failure: Secondary | ICD-10-CM | POA: Diagnosis not present

## 2021-09-14 NOTE — Telephone Encounter (Signed)
Called and spoke with patient to make her aware she is not being billed. Patient expressed understanding. Nothing further needed at this time.

## 2021-09-16 ENCOUNTER — Ambulatory Visit: Payer: Medicare Other | Admitting: Pulmonary Disease

## 2021-09-16 ENCOUNTER — Other Ambulatory Visit: Payer: Self-pay

## 2021-09-16 ENCOUNTER — Encounter: Payer: Self-pay | Admitting: Pulmonary Disease

## 2021-09-16 VITALS — BP 128/64 | HR 80 | Temp 97.6°F | Ht 60.0 in | Wt 219.0 lb

## 2021-09-16 DIAGNOSIS — Z5181 Encounter for therapeutic drug level monitoring: Secondary | ICD-10-CM | POA: Diagnosis not present

## 2021-09-16 DIAGNOSIS — J84112 Idiopathic pulmonary fibrosis: Secondary | ICD-10-CM | POA: Insufficient documentation

## 2021-09-16 LAB — HEPATIC FUNCTION PANEL
ALT: 11 U/L (ref 0–35)
AST: 15 U/L (ref 0–37)
Albumin: 4.1 g/dL (ref 3.5–5.2)
Alkaline Phosphatase: 53 U/L (ref 39–117)
Bilirubin, Direct: 0.1 mg/dL (ref 0.0–0.3)
Total Bilirubin: 0.5 mg/dL (ref 0.2–1.2)
Total Protein: 7.3 g/dL (ref 6.0–8.3)

## 2021-09-16 NOTE — Progress Notes (Signed)
Carrie Haynes    [id number]    05-03-1941  Primary Care Physician:White, Aram Beecham, MD  Referring Physician: Laurann Montana, MD [id number] [place],  [place] [address]  Chief complaint: Follow-up for IPF  HPI: 80 year old with history of hypertension, hyperlipidemia, atrial fibrillation Referred for evaluation of pulmonary fibrosis  She has CT scans showing progressive probable UIP pattern with worsening dyspnea.  She was started on supplemental oxygen 1 year ago Has chronic cough.  Denies any wheezing, sputum production, fevers, chills  History significant for atrial fibrillation for which she was on Xarelto.  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  No feather pillows or comforter ILD questionnaire 08/06/2021-negative Smoking history: Never smoker Travel history: No significant travel history Relevant family history: No family history of lung disease  Interim history: She started on Esbriet in November 2022 Tolerating it well without any problem   Outpatient Encounter Medications as of 09/16/2021  Medication Sig   atorvastatin (LIPITOR) 20 MG tablet Take 20 mg by mouth daily.   Cholecalciferol (VITAMIN D-3) 5000 units TABS Take 5,000 Units by mouth daily.   dofetilide (TIKOSYN) 500 MCG capsule Take 1 capsule (500 mcg total) by mouth 2 (two) times daily.   FREESTYLE PRECISION NEO TEST test strip USE AS DIRECTED TO TEST BLOOD SUGAR DAILY E11.21   losartan (COZAAR) 50 MG tablet Take 50 mg by mouth daily.   metFORMIN (GLUCOPHAGE-XR) 500 MG 24 hr tablet Take 1,500 mg by mouth at bedtime.    metoprolol succinate (TOPROL-XL) 50 MG 24 hr tablet TAKE 1 TABLET BY MOUTH DAILY. TAKE WITH OR IMMEDIATELY FOLLOWING A MEAL.   Pirfenidone (ESBRIET) 267 MG TABS Take 1 tab three times daily for 7 days, then 2 tabs three times daily for 7 days, then  3 tabs three times daily thereafter.   XARELTO 20 MG TABS tablet TAKE 1 TABLET BY MOUTH DAILY WITH SUPPER.   Pirfenidone (ESBRIET) 801 MG TABS Take 801 mg by mouth 3 (three) times daily with meals. (Patient not taking: Reported on 09/16/2021)   No facility-administered encounter medications on file as of 09/16/2021.   Physical Exam: Blood pressure 128/64, pulse 80, temperature 97.6 F (36.4 C), temperature source Oral, height 5' (1.524 m), weight 219 lb (99.3 kg), SpO2 93 %. Gen:      No acute distress HEENT:  EOMI, sclera anicteric Neck:     No masses; no thyromegaly Lungs:    Bibasal crackles CV:         Regular rate and rhythm; no murmurs Abd:      + bowel sounds; soft, non-tender; no palpable masses, no distension Ext:    No edema; adequate peripheral perfusion Skin:      Warm and dry; no rash Neuro: alert and oriented x 3 Psych: normal mood and affect   Data Reviewed: Imaging: CT high-resolution 07/20/2021- Worsening of interstitial lung disease and probable UIP pattern, dilatation of pulmonary trunk, moderate cardiomegaly, atherosclerosis and three-vessel coronary disease I have reviewed the images personally.  PFTs: 10/23/2017 FVC 1.76 [74%], FEV1 1.66 [93%], F/F 94, TLC 2.65 [59%], DLCO 9.34 [49%] Moderate restriction, severe diffusion defect  Labs: CTD serologies 08/06/2021-negative proBNP 08/06/2021-742  Assessment:  Pulmonary fibrosis.   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  No significant CTD  symptoms are exposures.  High probability for being IPF  CTD serologies are negative Discussed treatment options with patient including antifibrotic's.  We have started Esbriet and she is tolerating it well so far Repeat hepatic panel today proBNP is minimally elevated.  No evidence of pulmonary hypertension on previous echocardiogram   Plan/Recommendations: Continue Esbriet, hepatic panel  Chilton Greathouse MD [place]  [facility] 09/16/2021, 2:55 PM  CC: Laurann Montana, MD

## 2021-09-16 NOTE — Patient Instructions (Signed)
Glad you are doing well with your breathing I am glad you are tolerating the medication well We will check hepatic panel today Follow-up in 1 month

## 2021-09-26 ENCOUNTER — Other Ambulatory Visit: Payer: Self-pay | Admitting: Cardiovascular Disease

## 2021-10-04 ENCOUNTER — Other Ambulatory Visit: Payer: Self-pay

## 2021-10-04 ENCOUNTER — Telehealth: Payer: Self-pay | Admitting: *Deleted

## 2021-10-04 ENCOUNTER — Inpatient Hospital Stay (HOSPITAL_BASED_OUTPATIENT_CLINIC_OR_DEPARTMENT_OTHER): Payer: Medicare Other | Admitting: Family

## 2021-10-04 ENCOUNTER — Encounter: Payer: Self-pay | Admitting: Family

## 2021-10-04 ENCOUNTER — Inpatient Hospital Stay: Payer: Medicare Other | Attending: Hematology & Oncology

## 2021-10-04 VITALS — BP 115/46 | HR 69 | Temp 98.0°F | Resp 19 | Ht 60.0 in | Wt 221.8 lb

## 2021-10-04 DIAGNOSIS — R0602 Shortness of breath: Secondary | ICD-10-CM | POA: Diagnosis not present

## 2021-10-04 DIAGNOSIS — Z7901 Long term (current) use of anticoagulants: Secondary | ICD-10-CM | POA: Diagnosis not present

## 2021-10-04 DIAGNOSIS — K922 Gastrointestinal hemorrhage, unspecified: Secondary | ICD-10-CM | POA: Insufficient documentation

## 2021-10-04 DIAGNOSIS — Z79899 Other long term (current) drug therapy: Secondary | ICD-10-CM | POA: Diagnosis not present

## 2021-10-04 DIAGNOSIS — R5383 Other fatigue: Secondary | ICD-10-CM | POA: Insufficient documentation

## 2021-10-04 DIAGNOSIS — R202 Paresthesia of skin: Secondary | ICD-10-CM | POA: Diagnosis not present

## 2021-10-04 DIAGNOSIS — D509 Iron deficiency anemia, unspecified: Secondary | ICD-10-CM

## 2021-10-04 DIAGNOSIS — D500 Iron deficiency anemia secondary to blood loss (chronic): Secondary | ICD-10-CM | POA: Insufficient documentation

## 2021-10-04 LAB — RETICULOCYTES
Immature Retic Fract: 10.1 % (ref 2.3–15.9)
RBC.: 3.5 MIL/uL — ABNORMAL LOW (ref 3.87–5.11)
Retic Count, Absolute: 73.2 10*3/uL (ref 19.0–186.0)
Retic Ct Pct: 2.1 % (ref 0.4–3.1)

## 2021-10-04 LAB — CBC WITH DIFFERENTIAL (CANCER CENTER ONLY)
Abs Immature Granulocytes: 0.05 10*3/uL (ref 0.00–0.07)
Basophils Absolute: 0 10*3/uL (ref 0.0–0.1)
Basophils Relative: 1 %
Eosinophils Absolute: 0.1 10*3/uL (ref 0.0–0.5)
Eosinophils Relative: 1 %
HCT: 33.4 % — ABNORMAL LOW (ref 36.0–46.0)
Hemoglobin: 10.9 g/dL — ABNORMAL LOW (ref 12.0–15.0)
Immature Granulocytes: 1 %
Lymphocytes Relative: 32 %
Lymphs Abs: 2 10*3/uL (ref 0.7–4.0)
MCH: 31.2 pg (ref 26.0–34.0)
MCHC: 32.6 g/dL (ref 30.0–36.0)
MCV: 95.7 fL (ref 80.0–100.0)
Monocytes Absolute: 0.7 10*3/uL (ref 0.1–1.0)
Monocytes Relative: 10 %
Neutro Abs: 3.6 10*3/uL (ref 1.7–7.7)
Neutrophils Relative %: 55 %
Platelet Count: 215 10*3/uL (ref 150–400)
RBC: 3.49 MIL/uL — ABNORMAL LOW (ref 3.87–5.11)
RDW: 13 % (ref 11.5–15.5)
WBC Count: 6.5 10*3/uL (ref 4.0–10.5)
nRBC: 0 % (ref 0.0–0.2)

## 2021-10-04 IMAGING — CT CT CHEST HIGH RESOLUTION W/O CM
2 of 7 series · 14 of 36 positions shown, 17 images · non-contrast
Comparison: Chest CT 08/10/2020.

CLINICAL DATA: 79-year-old female with history of chronic shortness
of breath. Eighty a pathic pulmonary fibrosis.

EXAM:
CT CHEST WITHOUT CONTRAST
TECHNIQUE: Multidetector CT imaging of the chest was performed following the
standard protocol without intravenous contrast. High resolution
imaging of the lungs, as well as inspiratory and expiratory imaging,
was performed.

[Series 7: high resolution retro · axial · 0.66mm/px · z∈[-309,-79]mm · 11 of 278 slices shown, 14 images]
[im 24/278  mediastinal]
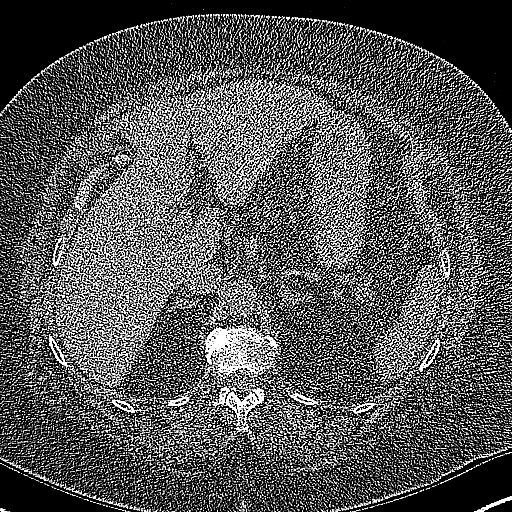
[im 24/278  lung]
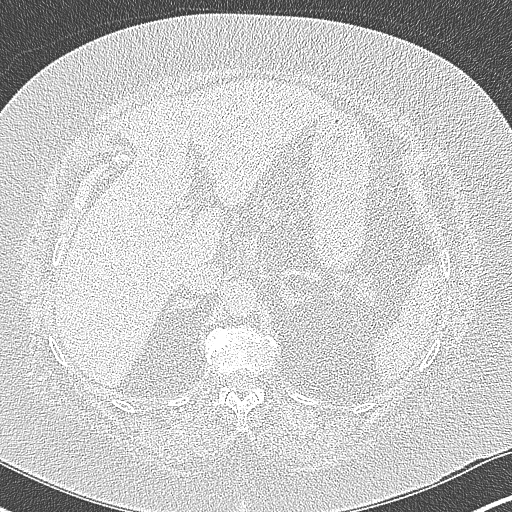
[im 47/278  lung]
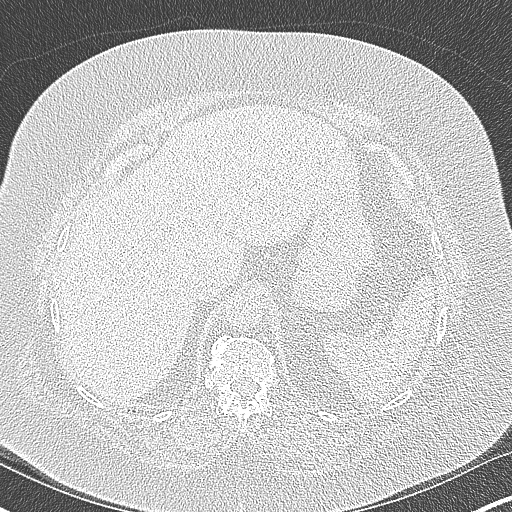
[im 70/278  lung]
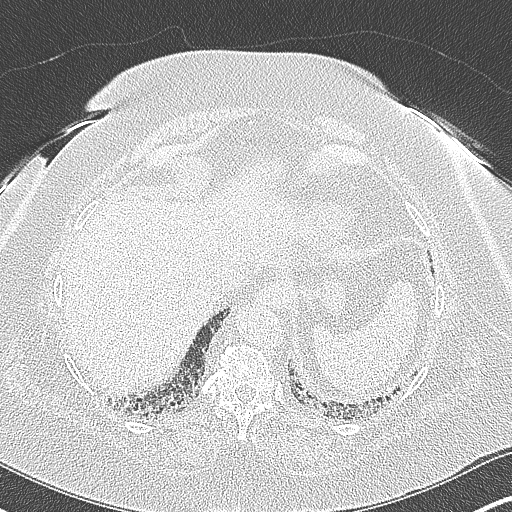
[im 93/278  lung]
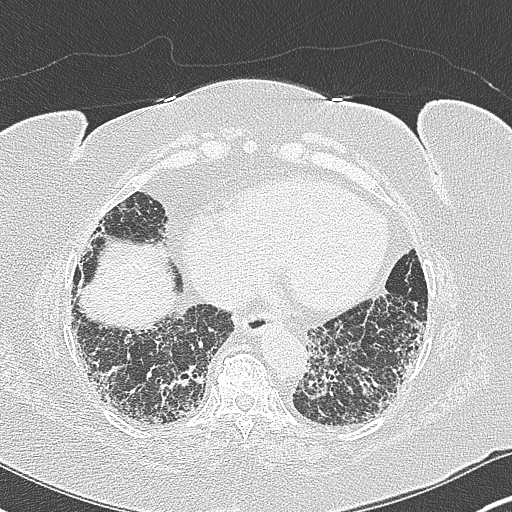
[im 116/278  mediastinal]
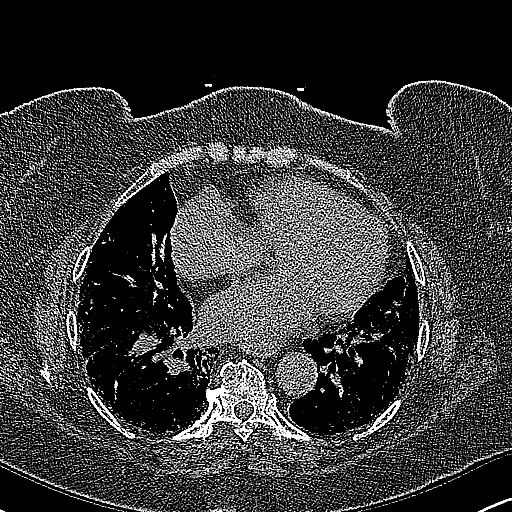
[im 116/278  lung]
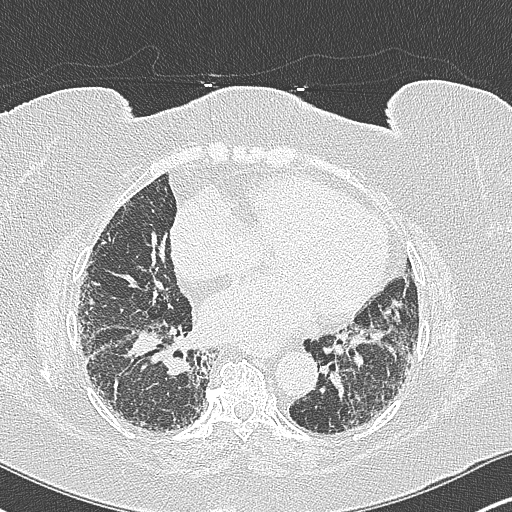
[im 139/278  lung]
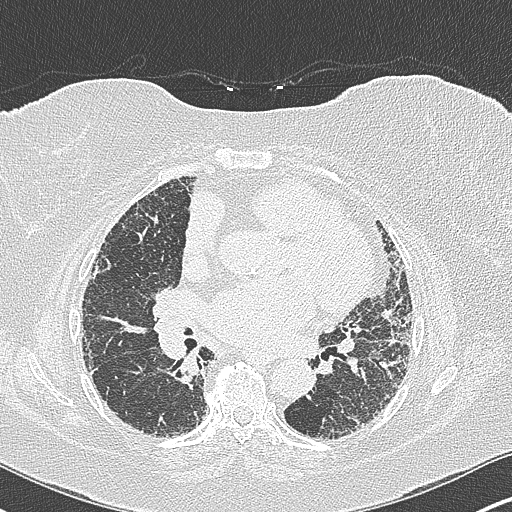
[im 162/278  lung]
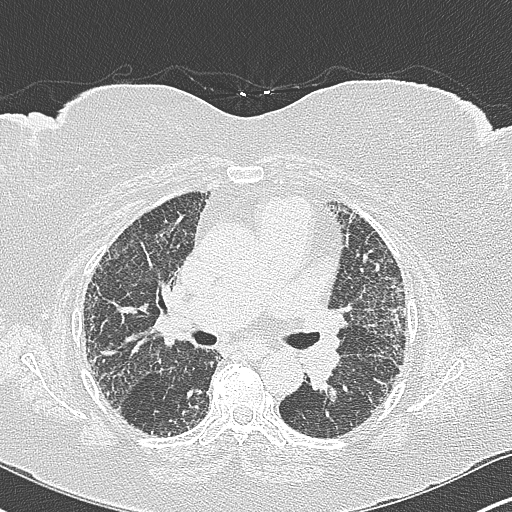
[im 185/278  lung]
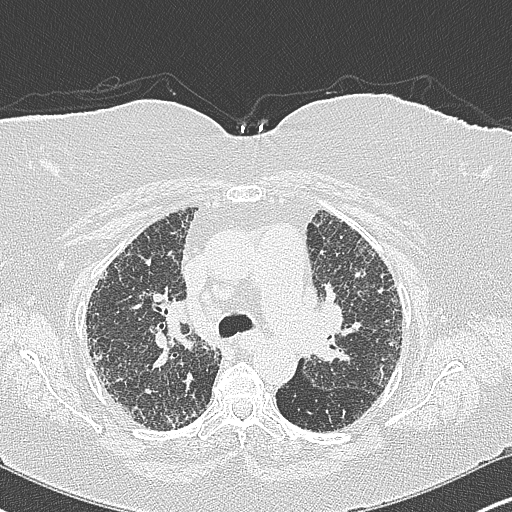
[im 208/278  mediastinal]
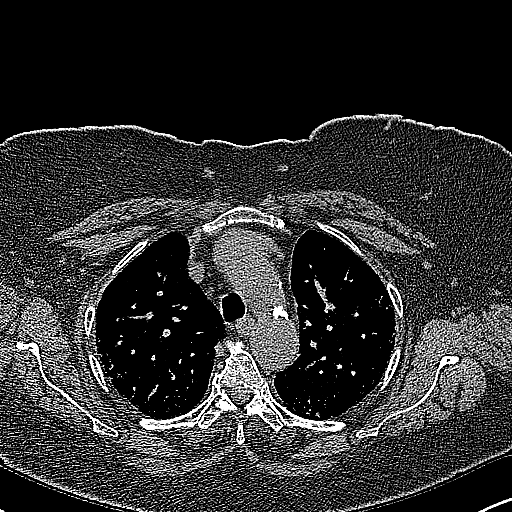
[im 208/278  lung]
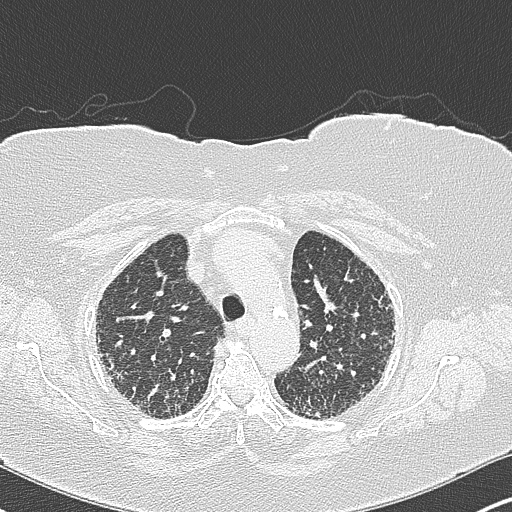
[im 231/278  lung]
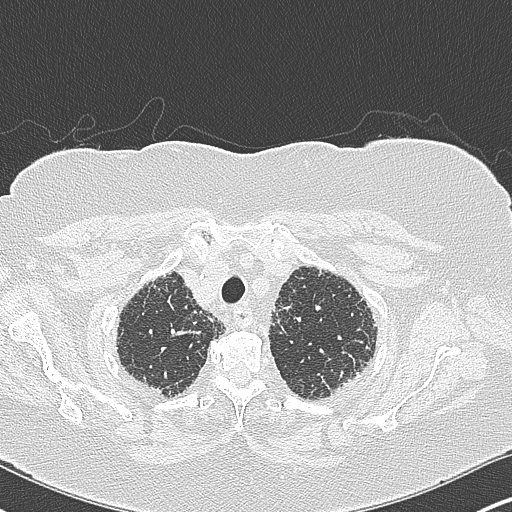
[im 254/278  lung]
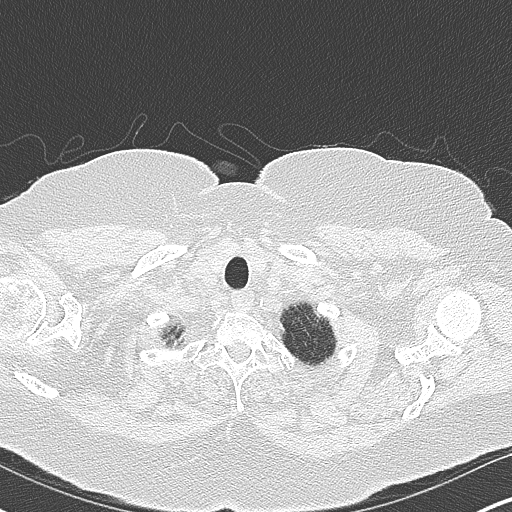

[Series 8: coronal · coronal · 0.54mm/px · 3 of 122 slices shown]
[im 25/122  lung]
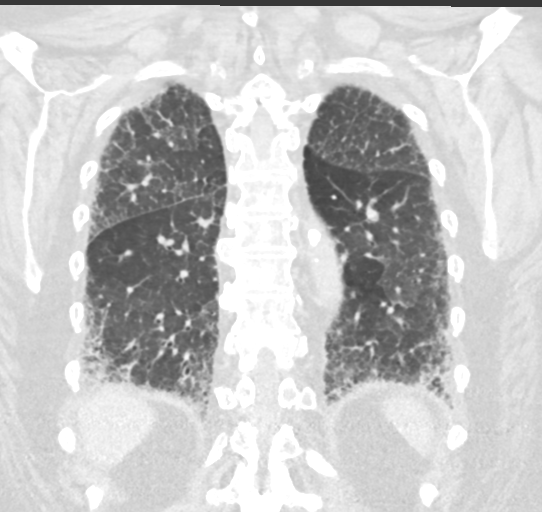
[im 49/122  lung]
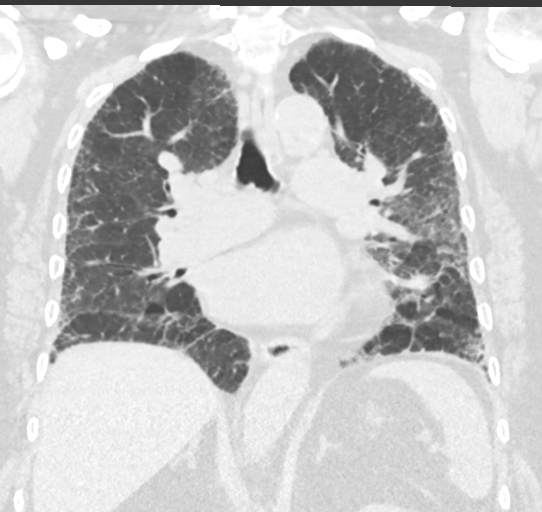
[im 73/122  lung]
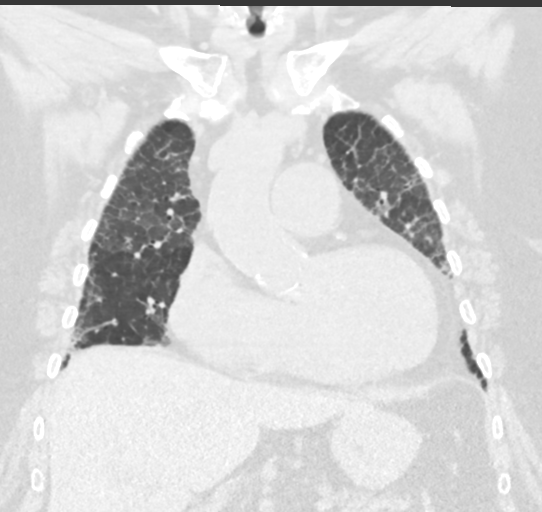

[14 of 36 positions shown; findings below may reference images not displayed]

FINDINGS: Cardiovascular: Heart size is moderately enlarged. There is no
significant pericardial fluid, thickening or pericardial
calcification. There is aortic atherosclerosis, as well as
atherosclerosis of the great vessels of the mediastinum and the
coronary arteries, including calcified atherosclerotic plaque in the
left main, left anterior descending, left circumflex and right
coronary arteries. Calcifications of the aortic valve. Dilatation of
the pulmonic trunk (3.6 cm in diameter), concerning for pulmonary
arterial hypertension.

Mediastinum/Nodes: No pathologically enlarged mediastinal or hilar
lymph nodes. Please note that accurate exclusion of hilar adenopathy
is limited on noncontrast CT scans. Esophagus is unremarkable in
appearance. No axillary lymphadenopathy.

Lungs/Pleura: High-resolution images demonstrate marked progression
of widespread areas of ground-glass attenuation, septal thickening,
subpleural reticulation, parenchymal banding, traction
bronchiectasis and peripheral bronchiolectasis in the lungs
bilaterally. There may be a very mild craniocaudal gradient,
although this is not definitive. No frank honeycombing confidently
identified. Inspiratory and expiratory imaging demonstrates some
mild air trapping indicative of small airways disease. No acute
consolidative airspace disease. No pleural effusions. No definite
suspicious appearing pulmonary nodules or masses are noted.

Upper Abdomen: Aortic atherosclerosis.

Musculoskeletal: There are no aggressive appearing lytic or blastic
lesions noted in the visualized portions of the skeleton.
IMPRESSION: 1. Marked worsening interstitial lung disease, with a spectrum of
findings that is still categorized as probable usual interstitial
pneumonia (UIP) per current ATS guidelines, although an alternative
diagnosis of chronic hypersensitivity pneumonitis should also be
considered.
2. Dilatation of the pulmonic trunk (3.6 cm in diameter), concerning
for associated pulmonary arterial hypertension.
3. Moderate cardiomegaly.
4. There are calcifications of the aortic valve. Echocardiographic
correlation for evaluation of potential valvular dysfunction may be
warranted if clinically indicated.
5. Aortic atherosclerosis, in addition to left main and 3 vessel
coronary artery disease. Assessment for potential risk factor
modification, dietary therapy or pharmacologic therapy may be
warranted, if clinically indicated.

Aortic Atherosclerosis ([id number]-[id number].W).

## 2021-10-04 NOTE — Telephone Encounter (Signed)
Per 10/04/21 los gave upcoming appointments - confirmed °

## 2021-10-04 NOTE — Progress Notes (Signed)
Hematology and Oncology Follow Up Visit  Carrie Haynes [id number] 10-01-1941 80 y.o. 10/04/2021   Principle Diagnosis:  Iron deficiency anemia secondary to GI blood loss   Current Therapy:        IV iron as indicated     Interim History:  Carrie Haynes is here today with her husband for follow-up.  She has fatigue at times and takes a break to rest as needed.  She notes occasional episodes of lightheadedness.  No falls or syncope to report.  No swelling or tenderness in her extremities at this time.  Tingling in her feet comes and goes.  She states that she uses 2L [place] supplemental O2 when sedentary and increases to 3L with exertion.  SOB is unchanged from baseline at this time.  No fever, chills, n/v, cough, rash, chest pain, palpitations, abdominal pain or changes in bowel or bladder habits.  She has a good appetite and is doing her best to hydrate appropriately throughout the day.  Her weight is stable at 221 lbs.   ECOG Performance Status: 1 - Symptomatic but completely ambulatory  Medications:  Allergies as of 10/04/2021       Reactions   Calcium Channel Blockers Other (See Comments)   Acute hypotension, patient became brady/asystole < 5 seconds   Estrogens Conjugated Other (See Comments)   Other reaction(s): DVT   Nystatin Hives        Medication List        Accurate as of October 04, 2021  3:15 PM. If you have any questions, ask your nurse or doctor.          atorvastatin 20 MG tablet Commonly known as: LIPITOR Take 20 mg by mouth daily.   dofetilide 500 MCG capsule Commonly known as: TIKOSYN TAKE 1 CAPSULE BY MOUTH 2 TIMES DAILY.   FreeStyle Precision Neo Test test strip Generic drug: glucose blood USE AS DIRECTED TO TEST BLOOD SUGAR DAILY E11.21   losartan 50 MG tablet Commonly known as: COZAAR Take 50 mg by mouth daily.   metFORMIN 500 MG 24 hr tablet Commonly known as: GLUCOPHAGE-XR Take 1,500 mg by mouth at bedtime.   metoprolol  succinate 50 MG 24 hr tablet Commonly known as: TOPROL-XL TAKE 1 TABLET BY MOUTH DAILY. TAKE WITH OR IMMEDIATELY FOLLOWING A MEAL.   Pirfenidone 267 MG Tabs Commonly known as: Esbriet Take 1 tab three times daily for 7 days, then 2 tabs three times daily for 7 days, then 3 tabs three times daily thereafter.   Pirfenidone 801 MG Tabs Commonly known as: Esbriet Take 801 mg by mouth 3 (three) times daily with meals.   Vitamin D-3 125 MCG (5000 UT) Tabs Take 5,000 Units by mouth daily.   Xarelto 20 MG Tabs tablet Generic drug: rivaroxaban TAKE 1 TABLET BY MOUTH DAILY WITH SUPPER.        Allergies:  Allergies  Allergen Reactions   Calcium Channel Blockers Other (See Comments)    Acute hypotension, patient became brady/asystole < 5 seconds   Estrogens Conjugated Other (See Comments)    Other reaction(s): DVT   Nystatin Hives    Past Medical History, Surgical history, Social history, and Family History were reviewed and updated.  Review of Systems: All other 10 point review of systems is negative.   Physical Exam:  height is 5' (1.524 m) and weight is 221 lb 12.8 oz (100.6 kg). Her oral temperature is 98 F (36.7 C). Her blood pressure is 115/46 (abnormal) and her pulse  is 69. Her respiration is 19 and oxygen saturation is 97%.   Wt Readings from Last 3 Encounters:  10/04/21 221 lb 12.8 oz (100.6 kg)  09/16/21 219 lb (99.3 kg)  08/06/21 219 lb (99.3 kg)    Ocular: Sclerae unicteric, pupils equal, round and reactive to light Ear-nose-throat: Oropharynx clear, dentition fair Lymphatic: No cervical or supraclavicular adenopathy Lungs no rales or rhonchi, good excursion bilaterally Heart regular rate and rhythm, no murmur appreciated Abd soft, nontender, positive bowel sounds MSK no focal spinal tenderness, no joint edema Neuro: non-focal, well-oriented, appropriate affect Breasts: Deferred   Lab Results  Component Value Date   WBC 6.5 10/04/2021   HGB 10.9 (L)  10/04/2021   HCT 33.4 (L) 10/04/2021   MCV 95.7 10/04/2021   PLT 215 10/04/2021   Lab Results  Component Value Date   FERRITIN 67 06/09/2021   IRON 66 06/09/2021   TIBC 341 06/09/2021   UIBC 276 06/09/2021   IRONPCTSAT 19 (L) 06/09/2021   Lab Results  Component Value Date   RETICCTPCT 2.1 10/04/2021   RBC 3.50 (L) 10/04/2021   No results found for: KPAFRELGTCHN, LAMBDASER, KAPLAMBRATIO No results found for: IGGSERUM, IGA, IGMSERUM No results found for: Marda Stalker, SPEI   Chemistry      Component Value Date/Time   NA 137 08/06/2021 1642   NA 137 10/10/2019 1025   K 4.6 08/06/2021 1642   CL 103 08/06/2021 1642   CO2 24 08/06/2021 1642   BUN 20 08/06/2021 1642   BUN 25 10/10/2019 1025   CREATININE 0.90 08/06/2021 1642      Component Value Date/Time   CALCIUM 9.0 08/06/2021 1642   ALKPHOS 53 09/16/2021 1515   AST 15 09/16/2021 1515   AST 15 02/09/2021 1016   ALT 11 09/16/2021 1515   ALT 10 02/09/2021 1016   BILITOT 0.5 09/16/2021 1515   BILITOT 0.7 02/09/2021 1016       Impression and Plan: Carrie Haynes is a very pleasant 80 yo caucasian female with anemia secondary to GI blood loss while on anticoagulation.  Iron studies are pending.  Follow-up in 4 months.    Eileen Stanford, NP 12/19/20223:15 PM

## 2021-10-05 LAB — IRON AND IRON BINDING CAPACITY (CC-WL,HP ONLY)
Iron: 100 ug/dL (ref 28–170)
Saturation Ratios: 29 % (ref 10.4–31.8)
TIBC: 344 ug/dL (ref 250–450)
UIBC: 244 ug/dL (ref 148–442)

## 2021-10-05 LAB — FERRITIN: Ferritin: 72 ng/mL (ref 11–307)

## 2021-10-14 DIAGNOSIS — J84112 Idiopathic pulmonary fibrosis: Secondary | ICD-10-CM | POA: Diagnosis not present

## 2021-10-14 DIAGNOSIS — I5043 Acute on chronic combined systolic (congestive) and diastolic (congestive) heart failure: Secondary | ICD-10-CM | POA: Diagnosis not present

## 2021-10-20 ENCOUNTER — Ambulatory Visit: Payer: Medicare Other | Admitting: Pulmonary Disease

## 2021-10-20 ENCOUNTER — Encounter: Payer: Self-pay | Admitting: Pulmonary Disease

## 2021-10-20 ENCOUNTER — Other Ambulatory Visit: Payer: Self-pay

## 2021-10-20 VITALS — BP 130/68 | HR 72 | Temp 98.1°F | Ht 60.0 in | Wt 221.0 lb

## 2021-10-20 DIAGNOSIS — J84112 Idiopathic pulmonary fibrosis: Secondary | ICD-10-CM

## 2021-10-20 DIAGNOSIS — Z5181 Encounter for therapeutic drug level monitoring: Secondary | ICD-10-CM

## 2021-10-20 LAB — HEPATIC FUNCTION PANEL
ALT: 12 U/L (ref 0–35)
AST: 13 U/L (ref 0–37)
Albumin: 3.9 g/dL (ref 3.5–5.2)
Alkaline Phosphatase: 46 U/L (ref 39–117)
Bilirubin, Direct: 0.1 mg/dL (ref 0.0–0.3)
Total Bilirubin: 0.5 mg/dL (ref 0.2–1.2)
Total Protein: 7.1 g/dL (ref 6.0–8.3)

## 2021-10-20 NOTE — Progress Notes (Signed)
Carrie Haynes    [id number]    May 11, 1941  Primary Care Physician:White, Aram Beecham, MD  Referring Physician: Laurann Montana, MD [phone] [address] [place],  [place] [address]  Chief complaint: Follow-up for IPF  HPI: 81 year old with history of hypertension, hyperlipidemia, atrial fibrillation Referred for evaluation of pulmonary fibrosis  She has CT scans showing progressive probable UIP pattern with worsening dyspnea.  She was started on supplemental oxygen 1 year ago Has chronic cough.  Denies any wheezing, sputum production, fevers, chills  History significant for atrial fibrillation for which she was on Xarelto.  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  No feather pillows or comforter ILD questionnaire 08/06/2021-negative Smoking history: Never smoker Travel history: No significant travel history Relevant family history: No family history of lung disease  Interim history: She started on Esbriet in November 2022 Tolerating it well without any problem  Dose increased to 801 mg tablet 1 week ago.  However she is taking it just once a day States that dyspnea on exertion is stable.  Continues on supplemental oxygen.  Outpatient Encounter Medications as of 10/20/2021  Medication Sig   atorvastatin (LIPITOR) 20 MG tablet Take 20 mg by mouth daily.   Cholecalciferol (VITAMIN D-3) 5000 units TABS Take 5,000 Units by mouth daily.   dofetilide (TIKOSYN) 500 MCG capsule TAKE 1 CAPSULE BY MOUTH 2 TIMES DAILY.   FREESTYLE PRECISION NEO TEST test strip USE AS DIRECTED TO TEST BLOOD SUGAR DAILY E11.21   losartan (COZAAR) 50 MG tablet Take 50 mg by mouth daily.   metFORMIN (GLUCOPHAGE-XR) 500 MG 24 hr tablet Take 1,500 mg by mouth at bedtime.    metoprolol succinate (TOPROL-XL) 50 MG 24 hr tablet TAKE 1 TABLET BY MOUTH DAILY. TAKE WITH OR IMMEDIATELY  FOLLOWING A MEAL.   Pirfenidone (ESBRIET) 801 MG TABS Take 801 mg by mouth 3 (three) times daily with meals.   XARELTO 20 MG TABS tablet TAKE 1 TABLET BY MOUTH DAILY WITH SUPPER.   [DISCONTINUED] Pirfenidone (ESBRIET) 267 MG TABS Take 1 tab three times daily for 7 days, then 2 tabs three times daily for 7 days, then 3 tabs three times daily thereafter.   No facility-administered encounter medications on file as of 10/20/2021.   Physical Exam: Blood pressure 130/68, pulse 72, temperature 98.1 F (36.7 C), temperature source Oral, height 5' (1.524 m), weight 221 lb (100.2 kg), SpO2 96 %. Gen:      No acute distress HEENT:  EOMI, sclera anicteric Neck:     No masses; no thyromegaly Lungs:    Bibasal crackles CV:         Regular rate and rhythm; no murmurs Abd:      + bowel sounds; soft, non-tender; no palpable masses, no distension Ext:    No edema; adequate peripheral perfusion Skin:      Warm and dry; no rash Neuro: alert and oriented x 3 Psych: normal mood and affect   Data Reviewed: Imaging: CT high-resolution 07/20/2021- Worsening of interstitial lung disease and probable UIP pattern, dilatation of pulmonary trunk, moderate cardiomegaly, atherosclerosis and three-vessel coronary disease I have reviewed the images personally.  PFTs: 10/23/2017 FVC 1.76 [74%], FEV1 1.66 [93%], F/F 94, TLC 2.65 [59%], DLCO 9.34 [49%] Moderate restriction, severe diffusion defect  Labs: CTD serologies 08/06/2021-negative proBNP 08/06/2021-742 Hepatic panel 09/16/2021-within normal  limits  Assessment:  Pulmonary fibrosis.   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  No significant CTD symptoms are exposures.  High probability for being IPF  CTD serologies are negative We have started Esbriet and she is tolerating it well so far.  We reviewed the dosing schedule and she has been instructed to take the medication 3 times daily Repeat hepatic panel  today and monthly proBNP is minimally elevated.  No evidence of pulmonary hypertension on previous echocardiogram   Follow-up in 3 months  Plan/Recommendations: Continue Esbriet, hepatic panel  Chilton Greathouse MD [facility] 10/20/2021, 9:45 AM  CC: Laurann Montana, MD

## 2021-10-20 NOTE — Patient Instructions (Addendum)
I am glad you are tolerating the medication well You need to take the tablet 3 times daily We will check a hepatic panel today and monthly for the next 2 months Follow-up in clinic in 3 months, televisit okay

## 2021-11-14 DIAGNOSIS — J84112 Idiopathic pulmonary fibrosis: Secondary | ICD-10-CM | POA: Diagnosis not present

## 2021-11-14 DIAGNOSIS — I5043 Acute on chronic combined systolic (congestive) and diastolic (congestive) heart failure: Secondary | ICD-10-CM | POA: Diagnosis not present

## 2021-12-14 DIAGNOSIS — I5043 Acute on chronic combined systolic (congestive) and diastolic (congestive) heart failure: Secondary | ICD-10-CM | POA: Diagnosis not present

## 2021-12-14 DIAGNOSIS — J84112 Idiopathic pulmonary fibrosis: Secondary | ICD-10-CM | POA: Diagnosis not present

## 2021-12-15 DIAGNOSIS — E559 Vitamin D deficiency, unspecified: Secondary | ICD-10-CM | POA: Diagnosis not present

## 2021-12-15 DIAGNOSIS — D500 Iron deficiency anemia secondary to blood loss (chronic): Secondary | ICD-10-CM | POA: Diagnosis not present

## 2021-12-15 DIAGNOSIS — E1169 Type 2 diabetes mellitus with other specified complication: Secondary | ICD-10-CM | POA: Diagnosis not present

## 2021-12-15 DIAGNOSIS — N183 Chronic kidney disease, stage 3 unspecified: Secondary | ICD-10-CM | POA: Diagnosis not present

## 2021-12-15 DIAGNOSIS — I130 Hypertensive heart and chronic kidney disease with heart failure and stage 1 through stage 4 chronic kidney disease, or unspecified chronic kidney disease: Secondary | ICD-10-CM | POA: Diagnosis not present

## 2021-12-15 DIAGNOSIS — E785 Hyperlipidemia, unspecified: Secondary | ICD-10-CM | POA: Diagnosis not present

## 2021-12-27 DIAGNOSIS — H401221 Low-tension glaucoma, left eye, mild stage: Secondary | ICD-10-CM | POA: Diagnosis not present

## 2021-12-27 DIAGNOSIS — H2589 Other age-related cataract: Secondary | ICD-10-CM | POA: Diagnosis not present

## 2021-12-27 DIAGNOSIS — H43813 Vitreous degeneration, bilateral: Secondary | ICD-10-CM | POA: Diagnosis not present

## 2021-12-27 DIAGNOSIS — H40021 Open angle with borderline findings, high risk, right eye: Secondary | ICD-10-CM | POA: Diagnosis not present

## 2021-12-31 ENCOUNTER — Other Ambulatory Visit: Payer: Self-pay | Admitting: Cardiovascular Disease

## 2021-12-31 NOTE — Telephone Encounter (Signed)
Prescription refill request for Xarelto received.  ?Indication:Afib ?Last office visit:9/22 ?Weight:100.2 kg ?Age:81 ?Scr:1.1 ?CrCl:66.67 ml/min ? ?Prescription refilled ? ?

## 2022-01-04 DIAGNOSIS — H401221 Low-tension glaucoma, left eye, mild stage: Secondary | ICD-10-CM | POA: Diagnosis not present

## 2022-01-11 ENCOUNTER — Ambulatory Visit: Payer: Medicare Other | Admitting: Internal Medicine

## 2022-01-12 DIAGNOSIS — J84112 Idiopathic pulmonary fibrosis: Secondary | ICD-10-CM | POA: Diagnosis not present

## 2022-01-12 DIAGNOSIS — I5043 Acute on chronic combined systolic (congestive) and diastolic (congestive) heart failure: Secondary | ICD-10-CM | POA: Diagnosis not present

## 2022-01-17 ENCOUNTER — Encounter: Payer: Self-pay | Admitting: Pulmonary Disease

## 2022-01-17 ENCOUNTER — Ambulatory Visit: Payer: Medicare Other | Admitting: Pulmonary Disease

## 2022-01-17 VITALS — BP 134/66 | HR 60 | Temp 97.8°F | Ht 60.0 in | Wt 217.8 lb

## 2022-01-17 DIAGNOSIS — J84112 Idiopathic pulmonary fibrosis: Secondary | ICD-10-CM | POA: Diagnosis not present

## 2022-01-17 NOTE — Patient Instructions (Signed)
Your labs from last month are stable so we do not need to repeat them.  Continue the Esbriet ?We will schedule PFTs and follow-up in 3 months ?Schedule high-res CT in 6 months. ?

## 2022-01-17 NOTE — Progress Notes (Signed)
? ?      ?Carrie Haynes    [id number]    [birth year] ? ?Primary Care Physician:White, Caren Griffins, MD ? ?Referring Physician: Harlan Stains, MD ?[address] ?[address] ?[place],  [place] [address] ? ?Chief complaint: Follow-up for IPF ?Started Esbriet in November 2022 ? ?HPI: ?81 year old with history of hypertension, hyperlipidemia, atrial fibrillation ?Referred for evaluation of pulmonary fibrosis ? 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  She was started on supplemental oxygen 1 year ago ?Has chronic cough.  Denies any wheezing, sputum production, fevers, chills ? ?History significant for atrial fibrillation for which she was on Xarelto.  She was on amiodarone briefly for 3 months from July 2018 to October 2018 ? ? ?Pets: No pets ?Occupation: Retired Therapist, music ?Exposures: No mold, hot tub, Jacuzzi.  No feather pillows or comforter ?ILD questionnaire 08/06/2021-negative ?Smoking history: Never smoker ?Travel history: No significant travel history ?Relevant family history: No family history of lung disease ? ?Interim history: ?She started on Esbriet in November 2022 ?Tolerating it well without any problem ? ?Dose increased to 801 mg tablet 1 week ago.  However she is taking it just twice a day ?States that dyspnea on exertion is stable.  Continues on supplemental oxygen. ? ?Outpatient Encounter Medications as of 01/17/2022  ?Medication Sig  ? atorvastatin (LIPITOR) 20 MG tablet Take 20 mg by mouth daily.  ? Cholecalciferol (VITAMIN D-3) 5000 units TABS Take 5,000 Units by mouth daily.  ? dofetilide (TIKOSYN) 500 MCG capsule TAKE 1 CAPSULE BY MOUTH 2 TIMES DAILY.  ? FREESTYLE PRECISION NEO TEST test strip USE AS DIRECTED TO TEST BLOOD SUGAR DAILY E11.21  ? losartan (COZAAR) 50 MG tablet Take 50 mg by mouth daily.  ? metFORMIN (GLUCOPHAGE-XR) 500 MG 24 hr tablet Take 1,500 mg by mouth at bedtime.   ? metoprolol succinate (TOPROL-XL) 50 MG 24 hr tablet TAKE 1 TABLET BY  MOUTH DAILY. TAKE WITH OR IMMEDIATELY FOLLOWING A MEAL.  ? Pirfenidone (ESBRIET) 801 MG TABS Take 801 mg by mouth 3 (three) times daily with meals.  ? XARELTO 20 MG TABS tablet TAKE 1 TABLET BY MOUTH DAILY WITH SUPPER  ? ?No facility-administered encounter medications on file as of 01/17/2022.  ? ?Physical Exam: ?Blood pressure 134/66, pulse 60, temperature 97.8 ?F (36.6 ?C), temperature source Oral, height 5' (1.524 m), weight 217 lb 12.8 oz (98.8 kg), SpO2 95 %. ?Gen:      No acute distress ?HEENT:  EOMI, sclera anicteric ?Neck:     No masses; no thyromegaly ?Lungs:    Bibasal crackles ?CV:         Regular rate and rhythm; no murmurs ?Abd:      + bowel sounds; soft, non-tender; no palpable masses, no distension ?Ext:    No edema; adequate peripheral perfusion ?Skin:      Warm and dry; no rash ?Neuro: alert and oriented x 3 ?Psych: normal mood and affect  ? ?Data Reviewed: ?Imaging: ?CT high-resolution 07/21/2021- ?Worsening of interstitial lung disease and probable UIP pattern, dilatation of pulmonary trunk, moderate cardiomegaly, atherosclerosis and three-vessel coronary disease ?I have reviewed the images personally. ? ?PFTs: ?10/23/2017 ?FVC 1.76 [74%], FEV1 1.66 [93%], F/F 94, TLC 2.65 [59%], DLCO 9.34 [49%] ?Moderate restriction, severe diffusion defect ? ?Labs: ?CTD serologies 08/06/2021-negative ?proBNP 08/06/2021-742 ?Hepatic panel 09/16/2021-within normal limits ? ?Labs from primary care dated 12/15/2021 ?CBC-WBC 8, hemoglobin 11.3, platelets 228, eosinophils 0.8%, BMP within normal limits.  Hepatic function panel normal. ? ?  Assessment:  ?Pulmonary fibrosis.   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  No significant CTD symptoms are exposures.  High probability for being IPF ? ?CTD serologies are negative ?We have started Esbriet and she is tolerating it well so far.  We reviewed the dosing schedule and she has been instructed to take the medication 3 times  daily ? ?proBNP is minimally elevated.  No evidence of pulmonary hypertension on previous echocardiogram  ? ?Follow-up in 3 months ? ?Plan/Recommendations: ?Continue Esbriet ?Check PFTs and follow-up in 3 months ? ?Marshell Garfinkel MD ?[facility] ?01/17/2022, 2:08 PM ? ?CC: Harlan Stains, MD ? ?

## 2022-01-18 DIAGNOSIS — H40021 Open angle with borderline findings, high risk, right eye: Secondary | ICD-10-CM | POA: Diagnosis not present

## 2022-01-26 ENCOUNTER — Ambulatory Visit: Payer: Medicare Other | Admitting: Cardiovascular Disease

## 2022-01-26 ENCOUNTER — Encounter: Payer: Self-pay | Admitting: Cardiovascular Disease

## 2022-01-26 VITALS — BP 114/62 | HR 61 | Ht 60.0 in | Wt 222.2 lb

## 2022-01-26 DIAGNOSIS — I10 Essential (primary) hypertension: Secondary | ICD-10-CM

## 2022-01-26 DIAGNOSIS — I5032 Chronic diastolic (congestive) heart failure: Secondary | ICD-10-CM | POA: Diagnosis not present

## 2022-01-26 DIAGNOSIS — R000 Tachycardia, unspecified: Secondary | ICD-10-CM

## 2022-01-26 DIAGNOSIS — Z79899 Other long term (current) drug therapy: Secondary | ICD-10-CM

## 2022-01-26 DIAGNOSIS — I43 Cardiomyopathy in diseases classified elsewhere: Secondary | ICD-10-CM

## 2022-01-26 DIAGNOSIS — I2781 Cor pulmonale (chronic): Secondary | ICD-10-CM

## 2022-01-26 DIAGNOSIS — Z5181 Encounter for therapeutic drug level monitoring: Secondary | ICD-10-CM

## 2022-01-26 DIAGNOSIS — D6869 Other thrombophilia: Secondary | ICD-10-CM

## 2022-01-26 DIAGNOSIS — E1169 Type 2 diabetes mellitus with other specified complication: Secondary | ICD-10-CM

## 2022-01-26 DIAGNOSIS — J84112 Idiopathic pulmonary fibrosis: Secondary | ICD-10-CM | POA: Diagnosis not present

## 2022-01-26 DIAGNOSIS — J9611 Chronic respiratory failure with hypoxia: Secondary | ICD-10-CM

## 2022-01-26 DIAGNOSIS — E669 Obesity, unspecified: Secondary | ICD-10-CM

## 2022-01-26 NOTE — Patient Instructions (Addendum)
Medication Instructions:  ?No changes ?*If you need a refill on your cardiac medications before your next appointment, please call your pharmacy* ? ? ?Lab Work: ?None ordered ?If you have labs (blood work) drawn today and your tests are completely normal, you will receive your results only by: ?MyChart Message (if you have MyChart) OR ?A paper copy in the mail ?If you have any lab test that is abnormal or we need to change your treatment, we will call you to review the results. ? ? ?Testing/Procedures: ?None ordered ? ? ?Follow-Up: ?At [facility], you and your health needs are our priority.  As part of our continuing mission to provide you with exceptional heart care, we have created designated Provider Care Teams.  These Care Teams include your primary Cardiologist (physician) and Advanced Practice Providers (APPs -  Physician Assistants and Nurse Practitioners) who all work together to provide you with the care you need, when you need it. ? ?We recommend signing up for the patient portal called "MyChart".  Sign up information is provided on this After Visit Summary.  MyChart is used to connect with patients for Virtual Visits (Telemedicine).  Patients are able to view lab/test results, encounter notes, upcoming appointments, etc.  Non-urgent messages can be sent to your provider as well.   ?To learn more about what you can do with MyChart, go to [email or web address].   ? ?Your next appointment:   ?Follow up in 6 months with an APP ?Follow up in 12 months with Dr. Royann Shivers ? ? ?Important Information About Sugar ? ? ? ? ? ? ?

## 2022-01-26 NOTE — Progress Notes (Signed)
?Cardiology Office Note:   ? ?Date:  01/28/2022  ? ?ID:  Carrie Haynes, DOB 1941/04/07, MRN [id number] ? ?PCP:  Harlan Stains, MD  ?Cardiologist:  Sanda Klein, MD   ? ?Referring MD: Harlan Stains, MD  ? ?No chief complaint on file. ? ? ? ? ?History of Present Illness:   ? ?Carrie Haynes is a 81 y.o. 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 ? ?The biggest complaint remains exertional dyspnea.  She wears oxygen 2 L by nasal cannula at rest and increasing it to 3 L when she walks.  Also sometimes in the higher oxygen concentration if she is emotional.  Her pulmonologist is Dr. Vaughan Browner.  She is receiving treatment with Esbriet. ? ?She has not had palpitations or clinical documentation of atrial fibrillation since we last saw her in clinic.  Has not had falls, serious injuries or bleeding problems. ? ?She denies chest pain at rest or with activity.  She has not had syncope or dizziness.  She denies lower extremity edema or claudication.   ? ?ECG today shows normal sinus rhythm and longstanding left anterior fascicular block.  Low voltage is likely explained by body habitus.  The QTc is normal at 434 ms (on dofetilide). ? 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  Previous pulmonary function tests have shown mild-moderate reduction in FVC (65-75% of predicted, DLCO corrects for alveolar volume).  She did receive amiodarone for a couple of months in 2018.  Neurologist last notes, he believes that obesity is a large part of the problem. ? ?Initially diagnosed with atrial fibrillation with rapid ventricular response in July 2018 when she presented with tachycardia cardiomyopathy.  Her echo showed mildly depressed LVEF at 45% with diffuse  hypokinesis and moderate mitral regurgitation, mildly dilated left atrium, but also a moderately dilated right atrium with moderate to severe tricuspid regurgitation and mild pulmonary hypertension.  Coronary angiography showed no evidence of significant CAD.  Rate control was difficult.  She underwent cardioversion on August 2 unsuccessfully, but eventually after loading with IV amiodarone was successfully cardioverted on August 7. Pulmonary function tests (before treatment with amiodarone) raised concern for restrictive lung disease, thereby making long-term treatment with amiodarone undesirable.  Amiodarone was stopped in November 2018. Follow-up echocardiogram shows complete normalization of left ventricular systolic function, EF 0000000, with some residual signs of impaired relaxation.  A CT of the chest performed in July 2020 did show evidence of mild-moderate pulmonary fibrosis, but this was unchanged from the previous CT in 2018.   ? ?In November 2020 she presented with recurrent shortness of breath and was found to again have atrial fibrillation.  She was in rapid ventricular response but was unaware of the arrhythmia.  Echocardiography shows her EF has again dropped to 45-50%.  She was hospitalized for dofetilide loading, converted to sinus rhythm without electrical cardioversion during antiarrhythmic administration. ? ? ? ? ?Past Medical History:  ?Diagnosis Date  ? A-fib ([place])   ? Diabetes mellitus without complication ([place])   ? Hyperlipemia   ? Hypertension   ? ? ?Past Surgical History:  ?Procedure Laterality Date  ? CARDIOVERSION N/A 05/18/2017  ? Procedure: CARDIOVERSION;  Surgeon: Josue Hector, MD;  Location: [facility] ENDOSCOPY;  Service: Cardiovascular;  Laterality: N/A;  ? CARDIOVERSION N/A 05/23/2017  ? Procedure:  CARDIOVERSION;  Surgeon: Sueanne Margarita, MD;  Location: [facility] ENDOSCOPY;  Service: Cardiovascular;  Laterality: N/A;  ? CARDIOVERSION N/A 08/28/2019  ? Procedure: CARDIOVERSION;  Surgeon: Jerline Pain, MD;  Location: [facility] ENDOSCOPY;  Service: Cardiovascular;  Laterality: N/A;  ? COLONOSCOPY N/A 08/07/2020  ? Procedure: COLONOSCOPY;  Surgeon: Arta Silence, MD;  Location: [facility];  Service: Endoscopy;  Laterality: N/A;  ? ESOPHAGOGASTRODUODENOSCOPY N/A 08/07/2020  ? Procedure: ESOPHAGOGASTRODUODENOSCOPY (EGD);  Surgeon: Arta Silence, MD;  Location: [facility] ENDOSCOPY;  Service: Endoscopy;  Laterality: N/A;  ? GIVENS CAPSULE STUDY N/A 08/09/2020  ? Procedure: GIVENS CAPSULE STUDY;  Surgeon: Arta Silence, MD;  Location: [facility] ENDOSCOPY;  Service: Endoscopy;  Laterality: N/A;  ? RIGHT/LEFT HEART CATH AND CORONARY ANGIOGRAPHY N/A 05/12/2017  ? Procedure: Right/Left Heart Cath and Coronary Angiography;  Surgeon: Belva Crome, MD;  Location: [facility] CV LAB;  Service: Cardiovascular;  Laterality: N/A;  ? TEE WITHOUT CARDIOVERSION N/A 05/18/2017  ? Procedure: TRANSESOPHAGEAL ECHOCARDIOGRAM (TEE);  Surgeon: Josue Hector, MD;  Location: [facility] ENDOSCOPY;  Service: Cardiovascular;  Laterality: N/A;  ? ? ?Current Medications: ?Current Meds  ?Medication Sig  ? atorvastatin (LIPITOR) 20 MG tablet Take 20 mg by mouth daily.  ? Cholecalciferol (VITAMIN D-3) 5000 units TABS Take 5,000 Units by mouth daily.  ? dofetilide (TIKOSYN) 500 MCG capsule TAKE 1 CAPSULE BY MOUTH 2 TIMES DAILY.  ? FREESTYLE PRECISION NEO TEST test strip USE AS DIRECTED TO TEST BLOOD SUGAR DAILY E11.21  ? losartan (COZAAR) 50 MG tablet Take 50 mg by mouth daily.  ? Magnesium Oxide 500 MG TABS Take 500 mg by mouth daily in the afternoon.  ? metFORMIN (GLUCOPHAGE-XR) 500 MG 24 hr tablet Take 1,500 mg by mouth at bedtime.   ? metoprolol succinate (TOPROL-XL) 50 MG 24 hr tablet TAKE 1 TABLET BY MOUTH DAILY. TAKE WITH OR IMMEDIATELY FOLLOWING A MEAL.  ? Pirfenidone (ESBRIET) 801 MG TABS Take 801 mg by mouth 3 (three) times daily with meals.  ? XARELTO 20 MG TABS tablet TAKE 1 TABLET BY MOUTH DAILY WITH SUPPER  ?  ? ?Allergies:   Calcium channel  blockers, Estrogens conjugated, and Nystatin  ? ?Social History  ? ?Socioeconomic History  ? Marital status: Widowed  ?  Spouse name: Not on file  ? Number of children: Not on file  ? Years of education: Not on file  ? Highest education level: Not on file  ?Occupational History  ? Not on file  ?Tobacco Use  ? Smoking status: Never  ? Smokeless tobacco: Never  ?Vaping Use  ? Vaping Use: Never used  ?Substance and Sexual Activity  ? Alcohol use: No  ? Drug use: No  ? Sexual activity: Not on file  ?Other Topics Concern  ? Not on file  ?Social History Narrative  ? Not on file  ? ?Social Determinants of Health  ? ?Financial Resource Strain: Not on file  ?Food Insecurity: Not on file  ?Transportation Needs: Not on file  ?Physical Activity: Not on file  ?Stress: Not on file  ?Social Connections: Not on file  ?  ? ?Family History: ?The patient's family history includes Breast cancer in an other family member; Heart attack in her sister; Hypertension in her father. ?ROS:   ?Please see the history of present illness.    ?All other systems are reviewed and are negative.  ?EKGs/Labs/Other Studies Reviewed:   ? ?The following studies were reviewed today: ? ?ECHO 09/03/2019: ? 1. Left ventricular ejection fraction,  by visual estimation, is 45 to 50%. ... ? ? ?ECHO 10/25/2019: ? ? 1. Left ventricular ejection fraction, by visual estimation, is 55 to  ?60%. The left ventricle has normal function. There is no left ventricular  ?hypertrophy.  ? 2. Left ventricular diastolic parameters are consistent with Grade I  ?diastolic dysfunction (impaired relaxation).  ? 3. The left ventricle has no regional wall motion abnormalities.  ? 4. Global right ventricle has mildly reduced systolic function.The right  ?ventricular size is mildly enlarged. No increase in right ventricular wall  ?thickness.  ? 5. Left atrial size was mildly dilated.  ? 6. Right atrial size was normal.  ? 7. Mild mitral annular calcification.  ? 8. The mitral valve is  normal in structure. Mild mitral valve  ?regurgitation. No evidence of mitral stenosis.  ? 9. The tricuspid valve is normal in structure.  ?10. The aortic valve is tricuspid. Aortic valve regurgitation is not  ?v

## 2022-02-02 ENCOUNTER — Inpatient Hospital Stay: Payer: Medicare Other | Admitting: Family

## 2022-02-02 ENCOUNTER — Inpatient Hospital Stay: Payer: Medicare Other | Attending: Hematology & Oncology

## 2022-02-02 ENCOUNTER — Encounter: Payer: Self-pay | Admitting: Family

## 2022-02-02 VITALS — BP 112/77 | HR 66 | Temp 98.3°F | Resp 18 | Wt 220.0 lb

## 2022-02-02 DIAGNOSIS — D500 Iron deficiency anemia secondary to blood loss (chronic): Secondary | ICD-10-CM | POA: Insufficient documentation

## 2022-02-02 DIAGNOSIS — D509 Iron deficiency anemia, unspecified: Secondary | ICD-10-CM

## 2022-02-02 DIAGNOSIS — K922 Gastrointestinal hemorrhage, unspecified: Secondary | ICD-10-CM | POA: Diagnosis not present

## 2022-02-02 DIAGNOSIS — Z9981 Dependence on supplemental oxygen: Secondary | ICD-10-CM | POA: Insufficient documentation

## 2022-02-02 DIAGNOSIS — Z79899 Other long term (current) drug therapy: Secondary | ICD-10-CM | POA: Insufficient documentation

## 2022-02-02 DIAGNOSIS — Z7901 Long term (current) use of anticoagulants: Secondary | ICD-10-CM | POA: Insufficient documentation

## 2022-02-02 LAB — CBC WITH DIFFERENTIAL (CANCER CENTER ONLY)
Abs Immature Granulocytes: 0.08 10*3/uL — ABNORMAL HIGH (ref 0.00–0.07)
Basophils Absolute: 0 10*3/uL (ref 0.0–0.1)
Basophils Relative: 1 %
Eosinophils Absolute: 0.1 10*3/uL (ref 0.0–0.5)
Eosinophils Relative: 1 %
HCT: 34.3 % — ABNORMAL LOW (ref 36.0–46.0)
Hemoglobin: 11 g/dL — ABNORMAL LOW (ref 12.0–15.0)
Immature Granulocytes: 1 %
Lymphocytes Relative: 24 %
Lymphs Abs: 1.4 10*3/uL (ref 0.7–4.0)
MCH: 31.9 pg (ref 26.0–34.0)
MCHC: 32.1 g/dL (ref 30.0–36.0)
MCV: 99.4 fL (ref 80.0–100.0)
Monocytes Absolute: 0.4 10*3/uL (ref 0.1–1.0)
Monocytes Relative: 7 %
Neutro Abs: 3.8 10*3/uL (ref 1.7–7.7)
Neutrophils Relative %: 66 %
Platelet Count: 195 10*3/uL (ref 150–400)
RBC: 3.45 MIL/uL — ABNORMAL LOW (ref 3.87–5.11)
RDW: 13 % (ref 11.5–15.5)
WBC Count: 5.7 10*3/uL (ref 4.0–10.5)
nRBC: 0 % (ref 0.0–0.2)

## 2022-02-02 LAB — RETICULOCYTES
Immature Retic Fract: 13.4 % (ref 2.3–15.9)
RBC.: 3.38 MIL/uL — ABNORMAL LOW (ref 3.87–5.11)
Retic Count, Absolute: 88.6 10*3/uL (ref 19.0–186.0)
Retic Ct Pct: 2.6 % (ref 0.4–3.1)

## 2022-02-02 LAB — FERRITIN: Ferritin: 45 ng/mL (ref 11–307)

## 2022-02-02 NOTE — Progress Notes (Signed)
?Hematology and Oncology Follow Up Visit ? ?Carrie Haynes ?[id number] ?1940-10-18 81 y.o. ?02/02/2022 ? ? ?Principle Diagnosis:  ?Iron deficiency anemia secondary to GI blood loss ?  ?Current Therapy:        ?IV iron as indicated  ?  ?Interim History:  Carrie Haynes is here today with her husband for follow-up. She is doing well and has no complaints at this time.  ?She has not noted any abnormal blood loss. No bruising or petechiae.  ?SOB is stable on 2 L [place] supplemental O2 24 hours a day.  ?No fever, chills, n/v, cough, rash, dizziness, chest pain, palpitations, abdominal pain or changes in bowel or bladder habits.  ?No swelling, tenderness, numbness or tingling in her extremities at this time.  ?No falls or syncope.  ?She has maintained a good appetite and is staying well hydrated. Her weight is stable at 220 lbs.  ? ?ECOG Performance Status: 1 - Symptomatic but completely ambulatory ? ?Medications:  ?Allergies as of 02/02/2022   ? ?   Reactions  ? Calcium Channel Blockers Other (See Comments)  ? Acute hypotension, patient became brady/asystole < 5 seconds  ? Estrogens Conjugated Other (See Comments)  ? Other reaction(s): DVT  ? Nystatin Hives  ? ?  ? ?  ?Medication List  ?  ? ?  ? Accurate as of February 02, 2022  2:44 PM. If you have any questions, ask your nurse or doctor.  ?  ?  ? ?  ? ?atorvastatin 20 MG tablet ?Commonly known as: LIPITOR ?Take 20 mg by mouth daily. ?  ?dofetilide 500 MCG capsule ?Commonly known as: TIKOSYN ?TAKE 1 CAPSULE BY MOUTH 2 TIMES DAILY. ?  ?FreeStyle Precision Neo Test test strip ?Generic drug: glucose blood ?USE AS DIRECTED TO TEST BLOOD SUGAR DAILY E11.21 ?  ?losartan 50 MG tablet ?Commonly known as: COZAAR ?Take 50 mg by mouth daily. ?  ?Magnesium Oxide 500 MG Tabs ?Take 500 mg by mouth daily in the afternoon. ?  ?metFORMIN 500 MG 24 hr tablet ?Commonly known as: GLUCOPHAGE-XR ?Take 1,500 mg by mouth at bedtime. ?  ?metoprolol succinate 50 MG 24 hr tablet ?Commonly known as:  TOPROL-XL ?TAKE 1 TABLET BY MOUTH DAILY. TAKE WITH OR IMMEDIATELY FOLLOWING A MEAL. ?  ?Pirfenidone 801 MG Tabs ?Commonly known as: Esbriet ?Take 801 mg by mouth 3 (three) times daily with meals. ?  ?Vitamin D-3 125 MCG (5000 UT) Tabs ?Take 5,000 Units by mouth daily. ?  ?Xarelto 20 MG Tabs tablet ?Generic drug: rivaroxaban ?TAKE 1 TABLET BY MOUTH DAILY WITH SUPPER ?  ? ?  ? ? ?Allergies:  ?Allergies  ?Allergen Reactions  ? Calcium Channel Blockers Other (See Comments)  ?  Acute hypotension, patient became brady/asystole < 5 seconds  ? Estrogens Conjugated Other (See Comments)  ?  Other reaction(s): DVT  ? Nystatin Hives  ? ? ?Past Medical History, Surgical history, Social history, and Family History were reviewed and updated. ? ?Review of Systems: ?All other 10 point review of systems is negative.  ? ?Physical Exam: ? vitals were not taken for this visit.  ? ?Wt Readings from Last 3 Encounters:  ?01/26/22 222 lb 3.2 oz (100.8 kg)  ?01/17/22 217 lb 12.8 oz (98.8 kg)  ?10/20/21 221 lb (100.2 kg)  ? ? ?Ocular: Sclerae unicteric, pupils equal, round and reactive to light ?Ear-nose-throat: Oropharynx clear, dentition fair ?Lymphatic: No cervical or supraclavicular adenopathy ?Lungs no rales or rhonchi, good excursion bilaterally ?Heart regular rate and rhythm,  no murmur appreciated ?Abd soft, nontender, positive bowel sounds ?MSK no focal spinal tenderness, no joint edema ?Neuro: non-focal, well-oriented, appropriate affect ?Breasts: Deferred  ? ?Lab Results  ?Component Value Date  ? WBC 5.7 02/02/2022  ? HGB 11.0 (L) 02/02/2022  ? HCT 34.3 (L) 02/02/2022  ? MCV 99.4 02/02/2022  ? PLT 195 02/02/2022  ? ?Lab Results  ?Component Value Date  ? FERRITIN 72 10/04/2021  ? IRON 100 10/04/2021  ? TIBC 344 10/04/2021  ? UIBC 244 10/04/2021  ? IRONPCTSAT 29 10/04/2021  ? ?Lab Results  ?Component Value Date  ? RETICCTPCT 2.6 02/02/2022  ? RBC 3.45 (L) 02/02/2022  ? RBC 3.38 (L) 02/02/2022  ? ?No results found for: KPAFRELGTCHN,  LAMBDASER, KAPLAMBRATIO ?No results found for: IGGSERUM, IGA, IGMSERUM ?No results found for: TOTALPROTELP, ALBUMINELP, A1GS, A2GS, BETS, BETA2SER, GAMS, MSPIKE, SPEI ?  Chemistry   ?   ?Component Value Date/Time  ? NA 137 08/06/2021 1642  ? NA 137 10/10/2019 1025  ? K 4.6 08/06/2021 1642  ? CL 103 08/06/2021 1642  ? CO2 24 08/06/2021 1642  ? BUN 20 08/06/2021 1642  ? BUN 25 10/10/2019 1025  ? CREATININE 0.90 08/06/2021 1642  ?    ?Component Value Date/Time  ? CALCIUM 9.0 08/06/2021 1642  ? ALKPHOS 46 10/20/2021 1013  ? AST 13 10/20/2021 1013  ? AST 15 02/09/2021 1016  ? ALT 12 10/20/2021 1013  ? ALT 10 02/09/2021 1016  ? BILITOT 0.5 10/20/2021 1013  ? BILITOT 0.7 02/09/2021 1016  ?  ? ? ? ?Impression and Plan: Carrie Haynes is a very pleasant 81 yo caucasian female with anemia secondary to GI blood loss while on anticoagulation.  ?Iron studies are pending.  ?Follow-up in 4 months.  ? ?Lottie Dawson, NP ?4/19/20232:44 PM ? ?

## 2022-02-03 LAB — IRON AND IRON BINDING CAPACITY (CC-WL,HP ONLY)
Iron: 93 ug/dL (ref 28–170)
Saturation Ratios: 28 % (ref 10.4–31.8)
TIBC: 336 ug/dL (ref 250–450)
UIBC: 243 ug/dL (ref 148–442)

## 2022-02-12 DIAGNOSIS — I5043 Acute on chronic combined systolic (congestive) and diastolic (congestive) heart failure: Secondary | ICD-10-CM | POA: Diagnosis not present

## 2022-02-12 DIAGNOSIS — J84112 Idiopathic pulmonary fibrosis: Secondary | ICD-10-CM | POA: Diagnosis not present

## 2022-02-15 DIAGNOSIS — L720 Epidermal cyst: Secondary | ICD-10-CM | POA: Diagnosis not present

## 2022-02-15 DIAGNOSIS — D225 Melanocytic nevi of trunk: Secondary | ICD-10-CM | POA: Diagnosis not present

## 2022-02-15 DIAGNOSIS — L821 Other seborrheic keratosis: Secondary | ICD-10-CM | POA: Diagnosis not present

## 2022-02-15 DIAGNOSIS — L820 Inflamed seborrheic keratosis: Secondary | ICD-10-CM | POA: Diagnosis not present

## 2022-03-02 DIAGNOSIS — H401221 Low-tension glaucoma, left eye, mild stage: Secondary | ICD-10-CM | POA: Diagnosis not present

## 2022-03-02 DIAGNOSIS — H43813 Vitreous degeneration, bilateral: Secondary | ICD-10-CM | POA: Diagnosis not present

## 2022-03-02 DIAGNOSIS — H40021 Open angle with borderline findings, high risk, right eye: Secondary | ICD-10-CM | POA: Diagnosis not present

## 2022-03-02 DIAGNOSIS — H2589 Other age-related cataract: Secondary | ICD-10-CM | POA: Diagnosis not present

## 2022-03-14 DIAGNOSIS — J84112 Idiopathic pulmonary fibrosis: Secondary | ICD-10-CM | POA: Diagnosis not present

## 2022-03-14 DIAGNOSIS — I5043 Acute on chronic combined systolic (congestive) and diastolic (congestive) heart failure: Secondary | ICD-10-CM | POA: Diagnosis not present

## 2022-03-31 ENCOUNTER — Other Ambulatory Visit: Payer: Self-pay | Admitting: Cardiovascular Disease

## 2022-04-14 DIAGNOSIS — J84112 Idiopathic pulmonary fibrosis: Secondary | ICD-10-CM | POA: Diagnosis not present

## 2022-04-14 DIAGNOSIS — I5043 Acute on chronic combined systolic (congestive) and diastolic (congestive) heart failure: Secondary | ICD-10-CM | POA: Diagnosis not present

## 2022-04-26 ENCOUNTER — Ambulatory Visit: Payer: Medicare Other | Admitting: Pulmonary Disease

## 2022-04-26 ENCOUNTER — Ambulatory Visit (INDEPENDENT_AMBULATORY_CARE_PROVIDER_SITE_OTHER): Payer: Medicare Other | Admitting: Pulmonary Disease

## 2022-04-26 ENCOUNTER — Encounter: Payer: Self-pay | Admitting: Pulmonary Disease

## 2022-04-26 VITALS — BP 120/64 | HR 88 | Ht 60.0 in | Wt 216.0 lb

## 2022-04-26 DIAGNOSIS — J84112 Idiopathic pulmonary fibrosis: Secondary | ICD-10-CM

## 2022-04-26 DIAGNOSIS — Z5181 Encounter for therapeutic drug level monitoring: Secondary | ICD-10-CM

## 2022-04-26 LAB — PULMONARY FUNCTION TEST
DL/VA % pred: 78 %
DL/VA: 3.32 ml/min/mmHg/L
DLCO cor % pred: 58 %
DLCO cor: 9.62 ml/min/mmHg
DLCO unc % pred: 58 %
DLCO unc: 9.62 ml/min/mmHg
FEF 25-75 Post: 1.82 L/sec
FEF 25-75 Pre: 1.26 L/sec
FEF2575-%Change-Post: 44 %
FEF2575-%Pred-Post: 141 %
FEF2575-%Pred-Pre: 97 %
FEV1-%Change-Post: 7 %
FEV1-%Pred-Post: 72 %
FEV1-%Pred-Pre: 67 %
FEV1-Post: 1.19 L
FEV1-Pre: 1.11 L
FEV1FVC-%Change-Post: 7 %
FEV1FVC-%Pred-Pre: 116 %
FEV6-%Change-Post: 2 %
FEV6-%Pred-Post: 61 %
FEV6-%Pred-Pre: 59 %
FEV6-Post: 1.28 L
FEV6-Pre: 1.24 L
FEV6FVC-%Pred-Post: 106 %
FEV6FVC-%Pred-Pre: 106 %
FVC-%Change-Post: 0 %
FVC-%Pred-Post: 57 %
FVC-%Pred-Pre: 57 %
FVC-Post: 1.28 L
FVC-Pre: 1.29 L
Post FEV1/FVC ratio: 93 %
Post FEV6/FVC ratio: 100 %
Pre FEV1/FVC ratio: 86 %
Pre FEV6/FVC Ratio: 100 %
RV % pred: 26 %
RV: 0.57 L
TLC % pred: 62 %
TLC: 2.78 L

## 2022-04-26 NOTE — Addendum Note (Signed)
Addended by: Jacquiline Doe on: 04/26/2022 03:55 PM   Modules accepted: Orders

## 2022-04-26 NOTE — Patient Instructions (Signed)
Full PFT performed today. °

## 2022-04-26 NOTE — Patient Instructions (Signed)
I am glad you are stable with your breathing We will check some labs today We will order high-res CT scan in 3 months and follow-up in clinic after CT scan

## 2022-04-26 NOTE — Progress Notes (Signed)
Full PFT performed today. °

## 2022-04-26 NOTE — Progress Notes (Signed)
Carrie Haynes    [id number]    1941/09/21  Primary Care Physician:White, Aram Beecham, MD  Referring Physician: Laurann Montana, MD [phone] [address] [place],  [place] [address]  Chief complaint: Follow-up for IPF Started Esbriet in November 2022  HPI: 81 year old with history of hypertension, hyperlipidemia, atrial fibrillation Referred for evaluation of pulmonary fibrosis  She has CT scans showing progressive probable UIP pattern with worsening dyspnea.  She was started on supplemental oxygen 1 year ago Has chronic cough.  Denies any wheezing, sputum production, fevers, chills  History significant for atrial fibrillation for which she was on Xarelto.  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  No feather pillows or comforter ILD questionnaire 08/06/2021-negative Smoking history: Never smoker Travel history: No significant travel history Relevant family history: No family history of lung disease  Interim history: She started on Esbriet in November 2022 Tolerating it well without any problem However she is taking it just twice a day States that dyspnea on exertion is stable.  Continues on supplemental oxygen.  Outpatient Encounter Medications as of 04/26/2022  Medication Sig   atorvastatin (LIPITOR) 20 MG tablet Take 20 mg by mouth daily.   Cholecalciferol (VITAMIN D-3) 5000 units TABS Take 5,000 Units by mouth daily.   dofetilide (TIKOSYN) 500 MCG capsule TAKE 1 CAPSULE BY MOUTH TWICE A DAY   FREESTYLE PRECISION NEO TEST test strip USE AS DIRECTED TO TEST BLOOD SUGAR DAILY E11.21   losartan (COZAAR) 50 MG tablet Take 50 mg by mouth daily.   Magnesium Oxide 500 MG TABS Take 500 mg by mouth daily in the afternoon.   metFORMIN (GLUCOPHAGE-XR) 500 MG 24 hr tablet Take 1,500 mg by mouth at bedtime.    metoprolol succinate (TOPROL-XL) 50 MG 24 hr  tablet TAKE 1 TABLET BY MOUTH DAILY. TAKE WITH OR IMMEDIATELY FOLLOWING A MEAL.   Pirfenidone (ESBRIET) 801 MG TABS Take 801 mg by mouth 3 (three) times daily with meals.   XARELTO 20 MG TABS tablet TAKE 1 TABLET BY MOUTH DAILY WITH SUPPER   No facility-administered encounter medications on file as of 04/26/2022.   Physical Exam: Blood pressure 120/64, pulse 88, height 5' (1.524 m), weight 216 lb (98 kg), SpO2 91 %. Gen:      No acute distress HEENT:  EOMI, sclera anicteric Neck:     No masses; no thyromegaly Lungs:    Clear to auscultation bilaterally; normal respiratory effort CV:         Regular rate and rhythm; no murmurs Abd:      + bowel sounds; soft, non-tender; no palpable masses, no distension Ext:    No edema; adequate peripheral perfusion Skin:      Warm and dry; no rash Neuro: alert and oriented x 3 Psych: normal mood and affect   Data Reviewed: Imaging: CT high-resolution 07/21/2021- Worsening of interstitial lung disease and probable UIP pattern, dilatation of pulmonary trunk, moderate cardiomegaly, atherosclerosis and three-vessel coronary disease I have reviewed the images personally.  PFTs: 10/23/2017 FVC 1.76 [74%], FEV1 1.66 [93%], F/F 94, TLC 2.65 [59%], DLCO 9.34 [49%] Moderate restriction, severe diffusion defect  04/26/2022 FVC 1.28 [57%], FEV1 1.19 [72%], F/F 93, TLC 2.78 [62%], DLCO 9.62 [58%] Moderate restriction and diffusion defect.  Labs: CTD serologies 08/06/2021-negative proBNP 08/06/2021-742 Hepatic panel 09/16/2021-within normal limits  Labs from primary  care dated 12/15/2021 CBC-WBC 8, hemoglobin 11.3, platelets 228, eosinophils 0.8%, BMP within normal limits.  Hepatic function panel normal.  Assessment:  Pulmonary fibrosis.   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  No significant CTD symptoms are exposures.  High probability for being IPF  CTD serologies are negative We have started  Esbriet and she is tolerating it well so far.  We reviewed the dosing schedule and she has been instructed to take the medication 3 times daily  proBNP is minimally elevated.  No evidence of pulmonary hypertension on previous echocardiogram  PFTs reviewed today with stable restriction and diffusion defect Check LFTs today and follow-up high-res CT in 3 months  Plan/Recommendations: Continue Esbriet Check LFTs today High-res CT in 3 months and follow-up in clinic after  Chilton Greathouse MD [facility] 04/26/2022, 3:30 PM  CC: Laurann Montana, MD

## 2022-04-27 LAB — COMPREHENSIVE METABOLIC PANEL
ALT: 12 U/L (ref 0–35)
AST: 16 U/L (ref 0–37)
Albumin: 4 g/dL (ref 3.5–5.2)
Alkaline Phosphatase: 59 U/L (ref 39–117)
BUN: 20 mg/dL (ref 6–23)
CO2: 24 mEq/L (ref 19–32)
Calcium: 9.2 mg/dL (ref 8.4–10.5)
Chloride: 100 mEq/L (ref 96–112)
Creatinine, Ser: 0.93 mg/dL (ref 0.40–1.20)
GFR: 58.79 mL/min — ABNORMAL LOW (ref >60.00)
Glucose, Bld: 126 mg/dL — ABNORMAL HIGH (ref 70–99)
Potassium: 4.5 mEq/L (ref 3.5–5.1)
Sodium: 135 mEq/L (ref 135–145)
Total Bilirubin: 0.5 mg/dL (ref 0.2–1.2)
Total Protein: 6.9 g/dL (ref 6.0–8.3)

## 2022-05-03 ENCOUNTER — Encounter: Payer: Self-pay | Admitting: *Deleted

## 2022-05-14 DIAGNOSIS — I5043 Acute on chronic combined systolic (congestive) and diastolic (congestive) heart failure: Secondary | ICD-10-CM | POA: Diagnosis not present

## 2022-05-14 DIAGNOSIS — J84112 Idiopathic pulmonary fibrosis: Secondary | ICD-10-CM | POA: Diagnosis not present

## 2022-06-02 ENCOUNTER — Inpatient Hospital Stay: Payer: Medicare Other

## 2022-06-02 ENCOUNTER — Inpatient Hospital Stay: Payer: Medicare Other | Admitting: Family

## 2022-06-03 ENCOUNTER — Ambulatory Visit: Payer: Medicare Other | Admitting: Family

## 2022-06-03 ENCOUNTER — Other Ambulatory Visit: Payer: Medicare Other

## 2022-06-09 ENCOUNTER — Inpatient Hospital Stay: Payer: Medicare Other | Attending: Family | Admitting: Family

## 2022-06-09 ENCOUNTER — Inpatient Hospital Stay: Payer: Medicare Other

## 2022-06-09 ENCOUNTER — Encounter: Payer: Self-pay | Admitting: Family

## 2022-06-09 VITALS — BP 114/69 | HR 72 | Temp 97.6°F | Resp 18 | Wt 211.8 lb

## 2022-06-09 DIAGNOSIS — D509 Iron deficiency anemia, unspecified: Secondary | ICD-10-CM

## 2022-06-09 DIAGNOSIS — K922 Gastrointestinal hemorrhage, unspecified: Secondary | ICD-10-CM | POA: Insufficient documentation

## 2022-06-09 DIAGNOSIS — Z7901 Long term (current) use of anticoagulants: Secondary | ICD-10-CM | POA: Diagnosis not present

## 2022-06-09 DIAGNOSIS — D500 Iron deficiency anemia secondary to blood loss (chronic): Secondary | ICD-10-CM | POA: Diagnosis not present

## 2022-06-09 LAB — CBC WITH DIFFERENTIAL (CANCER CENTER ONLY)
Abs Immature Granulocytes: 0.03 10*3/uL (ref 0.00–0.07)
Basophils Absolute: 0 10*3/uL (ref 0.0–0.1)
Basophils Relative: 0 %
Eosinophils Absolute: 0.1 10*3/uL (ref 0.0–0.5)
Eosinophils Relative: 1 %
HCT: 35.9 % — ABNORMAL LOW (ref 36.0–46.0)
Hemoglobin: 11.5 g/dL — ABNORMAL LOW (ref 12.0–15.0)
Immature Granulocytes: 0 %
Lymphocytes Relative: 36 %
Lymphs Abs: 2.7 10*3/uL (ref 0.7–4.0)
MCH: 31.6 pg (ref 26.0–34.0)
MCHC: 32 g/dL (ref 30.0–36.0)
MCV: 98.6 fL (ref 80.0–100.0)
Monocytes Absolute: 0.7 10*3/uL (ref 0.1–1.0)
Monocytes Relative: 9 %
Neutro Abs: 3.9 10*3/uL (ref 1.7–7.7)
Neutrophils Relative %: 54 %
Platelet Count: 224 10*3/uL (ref 150–400)
RBC: 3.64 MIL/uL — ABNORMAL LOW (ref 3.87–5.11)
RDW: 13.4 % (ref 11.5–15.5)
WBC Count: 7.4 10*3/uL (ref 4.0–10.5)
nRBC: 0 % (ref 0.0–0.2)

## 2022-06-09 LAB — RETICULOCYTES
Immature Retic Fract: 11.9 % (ref 2.3–15.9)
RBC.: 3.61 MIL/uL — ABNORMAL LOW (ref 3.87–5.11)
Retic Count, Absolute: 89.5 10*3/uL (ref 19.0–186.0)
Retic Ct Pct: 2.5 % (ref 0.4–3.1)

## 2022-06-09 LAB — FERRITIN: Ferritin: 51 ng/mL (ref 11–307)

## 2022-06-09 NOTE — Progress Notes (Signed)
Hematology and Oncology Follow Up Visit  Carrie Haynes [id number] October 20, 1940 81 y.o. 06/09/2022   Principle Diagnosis:  Iron deficiency anemia secondary to GI blood loss   Current Therapy:        IV iron as indicated    Interim History:  Carrie Haynes is here today with her husband for follow-up. She is doing well and has no complaints at this time.  She is on 3L supplemental O2 24 hours a day. SOB is stable. She is avoiding the heat outside.  No fever, chills, n/v, cough, rash, dizziness, chest pain, palpitations, abdominal pain or changes in bowel or bladder habits.  No swelling, numbness or tingling in her extremities.  No falls or syncope reported.  Appetite and hydration good. Her weight is stable at 211 lbs.   ECOG Performance Status: 1 - Symptomatic but completely ambulatory  Medications:  Allergies as of 06/09/2022       Reactions   Calcium Channel Blockers Other (See Comments)   Acute hypotension, patient became brady/asystole < 5 seconds   Estrogens Conjugated Other (See Comments)   Other reaction(s): DVT   Nystatin Hives        Medication List        Accurate as of June 09, 2022  3:01 PM. If you have any questions, ask your nurse or doctor.          atorvastatin 20 MG tablet Commonly known as: LIPITOR Take 20 mg by mouth daily.   dofetilide 500 MCG capsule Commonly known as: TIKOSYN TAKE 1 CAPSULE BY MOUTH TWICE A DAY   FreeStyle Precision Neo Test test strip Generic drug: glucose blood USE AS DIRECTED TO TEST BLOOD SUGAR DAILY E11.21   losartan 50 MG tablet Commonly known as: COZAAR Take 50 mg by mouth daily.   Magnesium Oxide -Mg Supplement 500 MG Tabs Take 500 mg by mouth daily in the afternoon.   metFORMIN 500 MG 24 hr tablet Commonly known as: GLUCOPHAGE-XR Take 1,500 mg by mouth at bedtime.   metoprolol succinate 50 MG 24 hr tablet Commonly known as: TOPROL-XL TAKE 1 TABLET BY MOUTH DAILY. TAKE WITH OR IMMEDIATELY FOLLOWING A  MEAL.   Pirfenidone 801 MG Tabs Commonly known as: Esbriet Take 801 mg by mouth 3 (three) times daily with meals.   Vitamin D-3 125 MCG (5000 UT) Tabs Take 5,000 Units by mouth daily.   Xarelto 20 MG Tabs tablet Generic drug: rivaroxaban TAKE 1 TABLET BY MOUTH DAILY WITH SUPPER        Allergies:  Allergies  Allergen Reactions   Calcium Channel Blockers Other (See Comments)    Acute hypotension, patient became brady/asystole < 5 seconds   Estrogens Conjugated Other (See Comments)    Other reaction(s): DVT   Nystatin Hives    Past Medical History, Surgical history, Social history, and Family History were reviewed and updated.  Review of Systems: All other 10 point review of systems is negative.   Physical Exam:  vitals were not taken for this visit.   Wt Readings from Last 3 Encounters:  04/26/22 216 lb (98 kg)  02/02/22 220 lb 0.6 oz (99.8 kg)  01/26/22 222 lb 3.2 oz (100.8 kg)    Ocular: Sclerae unicteric, pupils equal, round and reactive to light Ear-nose-throat: Oropharynx clear, dentition fair Lymphatic: No cervical or supraclavicular adenopathy Lungs no rales or rhonchi, good excursion bilaterally Heart regular rate and rhythm, no murmur appreciated Abd soft, nontender, positive bowel sounds MSK no focal spinal tenderness, no  joint edema Neuro: non-focal, well-oriented, appropriate affect Breasts: Deferred   Lab Results  Component Value Date   WBC 7.4 06/09/2022   HGB 11.5 (L) 06/09/2022   HCT 35.9 (L) 06/09/2022   MCV 98.6 06/09/2022   PLT 224 06/09/2022   Lab Results  Component Value Date   FERRITIN 45 02/02/2022   IRON 93 02/02/2022   TIBC 336 02/02/2022   UIBC 243 02/02/2022   IRONPCTSAT 28 02/02/2022   Lab Results  Component Value Date   RETICCTPCT 2.5 06/09/2022   RBC 3.61 (L) 06/09/2022   No results found for: "KPAFRELGTCHN", "LAMBDASER", "KAPLAMBRATIO" No results found for: "IGGSERUM", "IGA", "IGMSERUM" No results found for:  "TOTALPROTELP", "ALBUMINELP", "A1GS", "A2GS", "BETS", "BETA2SER", "GAMS", "MSPIKE", "SPEI"   Chemistry      Component Value Date/Time   NA 135 04/26/2022 1600   NA 137 10/10/2019 1025   K 4.5 04/26/2022 1600   CL 100 04/26/2022 1600   CO2 24 04/26/2022 1600   BUN 20 04/26/2022 1600   BUN 25 10/10/2019 1025   CREATININE 0.93 04/26/2022 1600   CREATININE 0.90 08/06/2021 1642      Component Value Date/Time   CALCIUM 9.2 04/26/2022 1600   ALKPHOS 59 04/26/2022 1600   AST 16 04/26/2022 1600   AST 15 02/09/2021 1016   ALT 12 04/26/2022 1600   ALT 10 02/09/2021 1016   BILITOT 0.5 04/26/2022 1600   BILITOT 0.7 02/09/2021 1016       Impression and Plan: Carrie Haynes is a very pleasant 81 yo caucasian female with anemia secondary to GI blood loss while on anticoagulation.  Iron studies pending.  Follow-up in 6 weeks.   Eileen Stanford, NP 8/24/20233:01 PM

## 2022-06-10 LAB — IRON AND IRON BINDING CAPACITY (CC-WL,HP ONLY)
Iron: 82 ug/dL (ref 28–170)
Saturation Ratios: 24 % (ref 10.4–31.8)
TIBC: 344 ug/dL (ref 250–450)
UIBC: 262 ug/dL (ref 148–442)

## 2022-06-13 ENCOUNTER — Telehealth: Payer: Self-pay | Admitting: Pulmonary Disease

## 2022-06-13 DIAGNOSIS — J849 Interstitial pulmonary disease, unspecified: Secondary | ICD-10-CM

## 2022-06-13 DIAGNOSIS — I129 Hypertensive chronic kidney disease with stage 1 through stage 4 chronic kidney disease, or unspecified chronic kidney disease: Secondary | ICD-10-CM | POA: Diagnosis not present

## 2022-06-13 DIAGNOSIS — E1169 Type 2 diabetes mellitus with other specified complication: Secondary | ICD-10-CM | POA: Diagnosis not present

## 2022-06-13 DIAGNOSIS — Z0000 Encounter for general adult medical examination without abnormal findings: Secondary | ICD-10-CM | POA: Diagnosis not present

## 2022-06-13 DIAGNOSIS — N183 Chronic kidney disease, stage 3 unspecified: Secondary | ICD-10-CM | POA: Diagnosis not present

## 2022-06-14 ENCOUNTER — Other Ambulatory Visit: Payer: Self-pay | Admitting: Family Medicine

## 2022-06-14 DIAGNOSIS — Z780 Asymptomatic menopausal state: Secondary | ICD-10-CM

## 2022-06-14 DIAGNOSIS — I5043 Acute on chronic combined systolic (congestive) and diastolic (congestive) heart failure: Secondary | ICD-10-CM | POA: Diagnosis not present

## 2022-06-14 DIAGNOSIS — J84112 Idiopathic pulmonary fibrosis: Secondary | ICD-10-CM | POA: Diagnosis not present

## 2022-06-14 DIAGNOSIS — E2839 Other primary ovarian failure: Secondary | ICD-10-CM

## 2022-06-14 NOTE — Telephone Encounter (Signed)
Refill pended for ESBRIET to [employer] [facility]) for Esbriet: [phone]  Dose: 801 mg three times daily  Last OV: 04/26/22 Provider: Dr. Isaiah Serge  Next OV: 08/04/22  Left VM for patient to confirm she has increase to 3 tabs three times daily and if she'd be comfortable with taking 801mg  tablet strength. Will f/u  , PharmD, MPH, BCPS Clinical Pharmacist (Rheumatology and Pulmonology)

## 2022-06-15 MED ORDER — PIRFENIDONE 801 MG PO TABS: 801.0000 mg | ORAL_TABLET | Freq: Three times a day (TID) | ORAL | 1 refills | Status: DC

## 2022-06-15 NOTE — Telephone Encounter (Signed)
Spoke with patient. She is comfortable switching to Esbriet 801mg  tablet. She is tolerating without issue.  Reviewed dose would be 1 tablet three times daily.   She will plan to f/u with [facility] to schedule shipment to home  , PharmD, MPH, BCPS, CPP Clinical Pharmacist (Rheumatology and Pulmonology)

## 2022-06-27 ENCOUNTER — Other Ambulatory Visit: Payer: Self-pay | Admitting: Family Medicine

## 2022-06-27 DIAGNOSIS — Z1231 Encounter for screening mammogram for malignant neoplasm of breast: Secondary | ICD-10-CM

## 2022-07-05 ENCOUNTER — Other Ambulatory Visit: Payer: Self-pay | Admitting: Cardiovascular Disease

## 2022-07-05 DIAGNOSIS — I4819 Other persistent atrial fibrillation: Secondary | ICD-10-CM

## 2022-07-05 NOTE — Telephone Encounter (Signed)
Prescription refill request for Xarelto received.  Indication:Afib  Last office visit: 01/26/22 ([facility])  Weight: 96.1kg Age: 81 Scr: 0.93 (04/26/22)  CrCl: 75.9ml/min  Appropriate dose and refill sent to requested pharmacy.

## 2022-07-08 DIAGNOSIS — H401220 Low-tension glaucoma, left eye, stage unspecified: Secondary | ICD-10-CM | POA: Diagnosis not present

## 2022-07-08 DIAGNOSIS — H43813 Vitreous degeneration, bilateral: Secondary | ICD-10-CM | POA: Diagnosis not present

## 2022-07-08 DIAGNOSIS — H40021 Open angle with borderline findings, high risk, right eye: Secondary | ICD-10-CM | POA: Diagnosis not present

## 2022-07-08 DIAGNOSIS — H2589 Other age-related cataract: Secondary | ICD-10-CM | POA: Diagnosis not present

## 2022-07-15 DIAGNOSIS — J84112 Idiopathic pulmonary fibrosis: Secondary | ICD-10-CM | POA: Diagnosis not present

## 2022-07-15 DIAGNOSIS — I5043 Acute on chronic combined systolic (congestive) and diastolic (congestive) heart failure: Secondary | ICD-10-CM | POA: Diagnosis not present

## 2022-07-25 ENCOUNTER — Ambulatory Visit (INDEPENDENT_AMBULATORY_CARE_PROVIDER_SITE_OTHER): Payer: Medicare Other

## 2022-07-25 DIAGNOSIS — I700 Atherosclerosis of aorta: Secondary | ICD-10-CM | POA: Diagnosis not present

## 2022-07-25 DIAGNOSIS — J84112 Idiopathic pulmonary fibrosis: Secondary | ICD-10-CM

## 2022-07-25 DIAGNOSIS — I2510 Atherosclerotic heart disease of native coronary artery without angina pectoris: Secondary | ICD-10-CM | POA: Diagnosis not present

## 2022-07-25 DIAGNOSIS — J479 Bronchiectasis, uncomplicated: Secondary | ICD-10-CM | POA: Diagnosis not present

## 2022-08-04 ENCOUNTER — Ambulatory Visit: Payer: Medicare Other | Admitting: Pulmonary Disease

## 2022-08-04 ENCOUNTER — Encounter: Payer: Self-pay | Admitting: Pulmonary Disease

## 2022-08-04 VITALS — BP 116/62 | HR 62 | Temp 97.7°F | Ht 60.0 in | Wt 209.0 lb

## 2022-08-04 DIAGNOSIS — Z5181 Encounter for therapeutic drug level monitoring: Secondary | ICD-10-CM

## 2022-08-04 DIAGNOSIS — J84112 Idiopathic pulmonary fibrosis: Secondary | ICD-10-CM

## 2022-08-04 DIAGNOSIS — J849 Interstitial pulmonary disease, unspecified: Secondary | ICD-10-CM

## 2022-08-04 NOTE — Progress Notes (Signed)
Carrie Haynes    [id number]    Apr 08, 1941  Primary Care Physician:White, Aram Beecham, MD  Referring Physician: Laurann Montana, MD [phone] [address] [place],  [place] [address]  Chief complaint: Follow-up for IPF Started Esbriet in November 2022  HPI: 81 year old with history of hypertension, hyperlipidemia, atrial fibrillation Referred for evaluation of pulmonary fibrosis  She has CT scans showing progressive probable UIP pattern with worsening dyspnea.  She was started on supplemental oxygen 1 year ago Has chronic cough.  Denies any wheezing, sputum production, fevers, chills  History significant for atrial fibrillation for which she was on Xarelto.  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  No feather pillows or comforter ILD questionnaire 08/06/2021-negative Smoking history: Never smoker Travel history: No significant travel history Relevant family history: No family history of lung disease  Interim history: Doing okay with Esbriet However she is taking it just twice a day States that dyspnea on exertion is stable.  Continues on supplemental oxygen.  Outpatient Encounter Medications as of 08/04/2022  Medication Sig   atorvastatin (LIPITOR) 20 MG tablet Take 20 mg by mouth daily.   Cholecalciferol (VITAMIN D-3) 5000 units TABS Take 5,000 Units by mouth daily.   dofetilide (TIKOSYN) 500 MCG capsule TAKE 1 CAPSULE BY MOUTH TWICE A DAY   FREESTYLE PRECISION NEO TEST test strip USE AS DIRECTED TO TEST BLOOD SUGAR DAILY E11.21   losartan (COZAAR) 50 MG tablet Take 50 mg by mouth daily.   Magnesium Oxide 500 MG TABS Take 500 mg by mouth daily in the afternoon.   metFORMIN (GLUCOPHAGE-XR) 500 MG 24 hr tablet Take 1,000 mg by mouth at bedtime. Taking 1000mg  daily   metoprolol succinate (TOPROL-XL) 50 MG 24 hr tablet TAKE 1 TABLET BY MOUTH DAILY.  TAKE WITH OR IMMEDIATELY FOLLOWING A MEAL.   Pirfenidone (ESBRIET) 801 MG TABS Take 801 mg by mouth 3 (three) times daily with meals.   XARELTO 20 MG TABS tablet TAKE 1 TABLET BY MOUTH DAILY WITH SUPPER   No facility-administered encounter medications on file as of 08/04/2022.   Physical Exam: Blood pressure 116/62, pulse 62, temperature 97.7 F (36.5 C), temperature source Oral, height 5' (1.524 m), weight 209 lb (94.8 kg), SpO2 91 %. Gen:      No acute distress HEENT:  EOMI, sclera anicteric Neck:     No masses; no thyromegaly Lungs:    Bibasal crackles CV:         Regular rate and rhythm; no murmurs Abd:      + bowel sounds; soft, non-tender; no palpable masses, no distension Ext:    No edema; adequate peripheral perfusion Skin:      Warm and dry; no rash Neuro: alert and oriented x 3 Psych: normal mood and affect   Data Reviewed: Imaging: CT high-resolution 07/21/2021- Worsening of interstitial lung disease and probable UIP pattern, dilatation of pulmonary trunk, moderate cardiomegaly, atherosclerosis and three-vessel coronary disease  CT high-resolution 07/25/2022 Pulmonary pattern of fibrosis which looks unchanged I have reviewed the images personally.  PFTs: 10/23/2017 FVC 1.76 [74%], FEV1 1.66 [93%], F/F 94, TLC 2.65 [59%], DLCO 9.34 [49%] Moderate restriction, severe diffusion defect  04/26/2022 FVC 1.28 [57%], FEV1 1.19 [72%], F/F 93, TLC 2.78 [62%], DLCO 9.62 [58%] Moderate restriction and diffusion defect.  Labs: CTD serologies 08/06/2021-negative proBNP 08/06/2021-742 Hepatic panel 09/16/2021-within  normal limits  Labs from primary care dated 12/15/2021 CBC-WBC 8, hemoglobin 11.3, platelets 228, eosinophils 0.8%, BMP within normal limits.    Hepatic panel 04/26/2022 - within normal limits  Assessment:  Pulmonary fibrosis.   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  No significant CTD symptoms are exposures.   High probability for being IPF  CTD serologies are negative We have started Esbriet and she is tolerating it well so far.  We reviewed the dosing schedule and she has been instructed to take the medication 3 times daily.  Hepatic panel from July is within normal limits.  proBNP is minimally elevated.  No evidence of pulmonary hypertension on previous echocardiogram  PFTs reviewed with stable restriction and diffusion defect Follow-up CT reviewed.  Although there is motion artifact grossly it looks stable compared to prior  Plan/Recommendations: Continue Esbriet.  Encouraged to take 3 times a day Follow-up in 6 months  Marshell Garfinkel MD [facility] 08/04/2022, 11:49 AM  CC: Harlan Stains, MD

## 2022-08-04 NOTE — Patient Instructions (Signed)
I am glad you are stable with your breathing Your CT scan looks stable as well Please take the Esbriet 3 times a day Follow-up in 6 months.

## 2022-08-09 ENCOUNTER — Telehealth: Payer: Self-pay | Admitting: Pharmacist

## 2022-08-09 NOTE — Telephone Encounter (Signed)
Submitted a Prior Authorization request to [facility] for ESBRIET via CoverMyMeds. Will update once we receive a response.   Key: [id number]

## 2022-08-09 NOTE — Telephone Encounter (Signed)
Received fax from [facility] requesting reverification of patient's eligibility for patient assistance program for her Esbriet. Spoke with patient and completed form over the phone. Faxed to [place] [phone])  PA for Esbriet appears to expire on 08/12/22. Pharmacy team - please resubmit PA renewal for Esbriet 801mg  TID in case [employer] requests in future.  Knox Saliva, PharmD, MPH, BCPS, CPP Clinical Pharmacist (Rheumatology and Pulmonology)

## 2022-08-10 NOTE — Telephone Encounter (Signed)
[place] from [place] denied prior authorization for [employer]. [place] phone number is [phone] option 5.

## 2022-08-11 NOTE — Telephone Encounter (Signed)
Received a fax regarding Prior Authorization from [facility] for [place]. Authorization has been DENIED because patient must try generic pirfenidone or Ofev (or prednisone?).  Please resubmit authorization for generic pirfendione  Knox Saliva, PharmD, MPH, BCPS, CPP Clinical Pharmacist (Rheumatology and Pulmonology)

## 2022-08-12 NOTE — Telephone Encounter (Signed)
Submitted a Prior Authorization request to [facility] for PIRFENIDONE 801mg  via CoverMyMeds. Will update once we receive a response.   Key: [id number]

## 2022-08-14 DIAGNOSIS — I5043 Acute on chronic combined systolic (congestive) and diastolic (congestive) heart failure: Secondary | ICD-10-CM | POA: Diagnosis not present

## 2022-08-14 DIAGNOSIS — J84112 Idiopathic pulmonary fibrosis: Secondary | ICD-10-CM | POA: Diagnosis not present

## 2022-08-15 ENCOUNTER — Other Ambulatory Visit (HOSPITAL_COMMUNITY): Payer: Self-pay

## 2022-08-15 NOTE — Telephone Encounter (Signed)
Received notification from [facility] regarding a prior authorization for PIRFENIDONE. Authorization has been APPROVED from 08/12/2022 to 08/13/2023. Approval letter sent to scan center for retention.

## 2022-08-16 ENCOUNTER — Other Ambulatory Visit: Payer: Self-pay | Admitting: Cardiovascular Disease

## 2022-08-26 ENCOUNTER — Other Ambulatory Visit: Payer: Self-pay

## 2022-08-26 MED ORDER — METOPROLOL SUCCINATE ER 50 MG PO TB24: 50.0000 mg | ORAL_TABLET | Freq: Every day | ORAL | 1 refills | Status: DC

## 2022-08-26 NOTE — Telephone Encounter (Signed)
Pt's medication was sent to pt's pharmacy as requested. Confirmation received.  °

## 2022-09-14 DIAGNOSIS — I5043 Acute on chronic combined systolic (congestive) and diastolic (congestive) heart failure: Secondary | ICD-10-CM | POA: Diagnosis not present

## 2022-09-14 DIAGNOSIS — J84112 Idiopathic pulmonary fibrosis: Secondary | ICD-10-CM | POA: Diagnosis not present

## 2022-09-29 ENCOUNTER — Other Ambulatory Visit: Payer: Self-pay | Admitting: Cardiovascular Disease

## 2022-10-14 DIAGNOSIS — I5043 Acute on chronic combined systolic (congestive) and diastolic (congestive) heart failure: Secondary | ICD-10-CM | POA: Diagnosis not present

## 2022-10-14 DIAGNOSIS — J84112 Idiopathic pulmonary fibrosis: Secondary | ICD-10-CM | POA: Diagnosis not present

## 2022-11-06 ENCOUNTER — Encounter: Payer: Self-pay | Admitting: Family

## 2022-11-14 DIAGNOSIS — J84112 Idiopathic pulmonary fibrosis: Secondary | ICD-10-CM | POA: Diagnosis not present

## 2022-11-14 DIAGNOSIS — I5043 Acute on chronic combined systolic (congestive) and diastolic (congestive) heart failure: Secondary | ICD-10-CM | POA: Diagnosis not present

## 2022-12-06 ENCOUNTER — Ambulatory Visit
Admission: RE | Admit: 2022-12-06 | Discharge: 2022-12-06 | Disposition: A | Discharge status: Home / Self Care | Payer: Medicare Other | Source: Ambulatory Visit | Attending: Family Medicine | Admitting: Family Medicine

## 2022-12-06 DIAGNOSIS — Z780 Asymptomatic menopausal state: Secondary | ICD-10-CM | POA: Diagnosis not present

## 2022-12-06 DIAGNOSIS — Z1231 Encounter for screening mammogram for malignant neoplasm of breast: Secondary | ICD-10-CM | POA: Diagnosis not present

## 2022-12-06 DIAGNOSIS — E2839 Other primary ovarian failure: Secondary | ICD-10-CM

## 2022-12-09 ENCOUNTER — Inpatient Hospital Stay: Payer: Medicare Other | Admitting: Family

## 2022-12-09 ENCOUNTER — Inpatient Hospital Stay: Payer: Medicare Other | Attending: Hematology & Oncology

## 2022-12-09 ENCOUNTER — Encounter: Payer: Self-pay | Admitting: Family

## 2022-12-09 VITALS — BP 131/65 | HR 73 | Temp 98.5°F | Resp 24 | Ht 60.0 in | Wt 214.0 lb

## 2022-12-09 DIAGNOSIS — R0602 Shortness of breath: Secondary | ICD-10-CM | POA: Diagnosis not present

## 2022-12-09 DIAGNOSIS — Z9981 Dependence on supplemental oxygen: Secondary | ICD-10-CM | POA: Diagnosis not present

## 2022-12-09 DIAGNOSIS — K922 Gastrointestinal hemorrhage, unspecified: Secondary | ICD-10-CM | POA: Diagnosis not present

## 2022-12-09 DIAGNOSIS — D500 Iron deficiency anemia secondary to blood loss (chronic): Secondary | ICD-10-CM | POA: Diagnosis not present

## 2022-12-09 DIAGNOSIS — D509 Iron deficiency anemia, unspecified: Secondary | ICD-10-CM

## 2022-12-09 DIAGNOSIS — Z79899 Other long term (current) drug therapy: Secondary | ICD-10-CM | POA: Diagnosis not present

## 2022-12-09 DIAGNOSIS — Z7901 Long term (current) use of anticoagulants: Secondary | ICD-10-CM | POA: Diagnosis not present

## 2022-12-09 LAB — CBC WITH DIFFERENTIAL (CANCER CENTER ONLY)
Abs Immature Granulocytes: 0.11 10*3/uL — ABNORMAL HIGH (ref 0.00–0.07)
Basophils Absolute: 0.1 10*3/uL (ref 0.0–0.1)
Basophils Relative: 1 %
Eosinophils Absolute: 0.1 10*3/uL (ref 0.0–0.5)
Eosinophils Relative: 1 %
HCT: 35.1 % — ABNORMAL LOW (ref 36.0–46.0)
Hemoglobin: 11.2 g/dL — ABNORMAL LOW (ref 12.0–15.0)
Immature Granulocytes: 1 %
Lymphocytes Relative: 35 %
Lymphs Abs: 3.1 10*3/uL (ref 0.7–4.0)
MCH: 31.4 pg (ref 26.0–34.0)
MCHC: 31.9 g/dL (ref 30.0–36.0)
MCV: 98.3 fL (ref 80.0–100.0)
Monocytes Absolute: 0.8 10*3/uL (ref 0.1–1.0)
Monocytes Relative: 10 %
Neutro Abs: 4.5 10*3/uL (ref 1.7–7.7)
Neutrophils Relative %: 52 %
Platelet Count: 242 10*3/uL (ref 150–400)
RBC: 3.57 MIL/uL — ABNORMAL LOW (ref 3.87–5.11)
RDW: 12.9 % (ref 11.5–15.5)
WBC Count: 8.6 10*3/uL (ref 4.0–10.5)
nRBC: 0 % (ref 0.0–0.2)

## 2022-12-09 LAB — RETICULOCYTES
Immature Retic Fract: 14.8 % (ref 2.3–15.9)
RBC.: 3.68 MIL/uL — ABNORMAL LOW (ref 3.87–5.11)
Retic Count, Absolute: 86.5 10*3/uL (ref 19.0–186.0)
Retic Ct Pct: 2.4 % (ref 0.4–3.1)

## 2022-12-09 LAB — FERRITIN: Ferritin: 35 ng/mL (ref 11–307)

## 2022-12-09 NOTE — Progress Notes (Signed)
Hematology and Oncology Follow Up Visit  Carrie Haynes OR:[id number] 1941-03-09 82 y.o. 12/09/2022   Principle Diagnosis:  Iron deficiency anemia secondary to GI blood loss   Current Therapy:        IV iron as indicated       Interim History:  Carrie Haynes is here today with her husband for follow-up. She is doing fairly well. She does have issues with SOB with over exertion and takes a break to rest as needed. She is on 2L supplemental O2 24 hours a day and bumps up to 3 when active.  No fever, chills, n/v, cough, rash, dizziness, chest pain, palpitations, abdominal pain or changes in bowel or bladder habits.  No blood loss noted. No abnormal bruising, no petechiae.  No swelling, tenderness, numbness or tingling in her extremities.  No falls or syncope.  Appetite and hydration are good. Weight is stable at 214 lbs.   ECOG Performance Status: 0 - Asymptomatic  Medications:  Allergies as of 12/09/2022       Reactions   Calcium Channel Blockers Other (See Comments)   Acute hypotension, patient became brady/asystole < 5 seconds   Estrogens Conjugated Other (See Comments)   Other reaction(s): DVT   Nystatin Hives        Medication List        Accurate as of December 09, 2022  2:17 PM. If you have any questions, ask your nurse or doctor.          atorvastatin 20 MG tablet Commonly known as: LIPITOR Take 20 mg by mouth daily.   dofetilide 500 MCG capsule Commonly known as: TIKOSYN TAKE 1 CAPSULE BY MOUTH TWICE A DAY   FreeStyle Precision Neo Test test strip Generic drug: glucose blood USE AS DIRECTED TO TEST BLOOD SUGAR DAILY E11.21   losartan 50 MG tablet Commonly known as: COZAAR Take 50 mg by mouth daily.   Magnesium Oxide -Mg Supplement 500 MG Tabs Take 500 mg by mouth daily in the afternoon.   metFORMIN 500 MG 24 hr tablet Commonly known as: GLUCOPHAGE-XR Take 1,000 mg by mouth at bedtime. Taking '1000mg'$  daily   metoprolol succinate 50 MG 24 hr  tablet Commonly known as: TOPROL-XL Take 1 tablet (50 mg total) by mouth daily.   Pirfenidone 801 MG Tabs Commonly known as: Esbriet Take 801 mg by mouth 3 (three) times daily with meals.   Vitamin D-3 125 MCG (5000 UT) Tabs Take 5,000 Units by mouth daily.   Xarelto 20 MG Tabs tablet Generic drug: rivaroxaban TAKE 1 TABLET BY MOUTH DAILY WITH SUPPER        Allergies:  Allergies  Allergen Reactions   Calcium Channel Blockers Other (See Comments)    Acute hypotension, patient became brady/asystole < 5 seconds   Estrogens Conjugated Other (See Comments)    Other reaction(s): DVT   Nystatin Hives    Past Medical History, Surgical history, Social history, and Family History were reviewed and updated.  Review of Systems: All other 10 point review of systems is negative.   Physical Exam:  vitals were not taken for this visit.   Wt Readings from Last 3 Encounters:  08/04/22 209 lb (94.8 kg)  06/09/22 211 lb 12.8 oz (96.1 kg)  04/26/22 216 lb (98 kg)    Ocular: Sclerae unicteric, pupils equal, round and reactive to light Ear-nose-throat: Oropharynx clear, dentition fair Lymphatic: No cervical or supraclavicular adenopathy Lungs no rales or rhonchi, good excursion bilaterally Heart regular rate and rhythm,  no murmur appreciated Abd soft, nontender, positive bowel sounds MSK no focal spinal tenderness, no joint edema Neuro: non-focal, well-oriented, appropriate affect Breasts: Deferred   Lab Results  Component Value Date   WBC 7.4 06/09/2022   HGB 11.5 (L) 06/09/2022   HCT 35.9 (L) 06/09/2022   MCV 98.6 06/09/2022   PLT 224 06/09/2022   Lab Results  Component Value Date   FERRITIN 51 06/09/2022   IRON 82 06/09/2022   TIBC 344 06/09/2022   UIBC 262 06/09/2022   IRONPCTSAT 24 06/09/2022   Lab Results  Component Value Date   RETICCTPCT 2.5 06/09/2022   RBC 3.61 (L) 06/09/2022   No results found for: "KPAFRELGTCHN", "LAMBDASER", "KAPLAMBRATIO" No results  found for: "IGGSERUM", "IGA", "IGMSERUM" No results found for: "TOTALPROTELP", "ALBUMINELP", "A1GS", "A2GS", "BETS", "BETA2SER", "GAMS", "MSPIKE", "SPEI"   Chemistry      Component Value Date/Time   NA 135 04/26/2022 1600   NA 137 10/10/2019 1025   K 4.5 04/26/2022 1600   CL 100 04/26/2022 1600   CO2 24 04/26/2022 1600   BUN 20 04/26/2022 1600   BUN 25 10/10/2019 1025   CREATININE 0.93 04/26/2022 1600   CREATININE 0.90 08/06/2021 1642      Component Value Date/Time   CALCIUM 9.2 04/26/2022 1600   ALKPHOS 59 04/26/2022 1600   AST 16 04/26/2022 1600   AST 15 02/09/2021 1016   ALT 12 04/26/2022 1600   ALT 10 02/09/2021 1016   BILITOT 0.5 04/26/2022 1600   BILITOT 0.7 02/09/2021 1016       Impression and Plan: Carrie Haynes is a very pleasant 82 yo caucasian female with anemia secondary to GI blood loss while on anticoagulation.  Iron studies pending.  Follow-up in 6 months.   Lottie Dawson, NP 2/23/20242:17 PM

## 2022-12-12 DIAGNOSIS — I700 Atherosclerosis of aorta: Secondary | ICD-10-CM | POA: Diagnosis not present

## 2022-12-12 DIAGNOSIS — E1169 Type 2 diabetes mellitus with other specified complication: Secondary | ICD-10-CM | POA: Diagnosis not present

## 2022-12-12 DIAGNOSIS — I130 Hypertensive heart and chronic kidney disease with heart failure and stage 1 through stage 4 chronic kidney disease, or unspecified chronic kidney disease: Secondary | ICD-10-CM | POA: Diagnosis not present

## 2022-12-12 DIAGNOSIS — I480 Paroxysmal atrial fibrillation: Secondary | ICD-10-CM | POA: Diagnosis not present

## 2022-12-12 DIAGNOSIS — E785 Hyperlipidemia, unspecified: Secondary | ICD-10-CM | POA: Diagnosis not present

## 2022-12-12 DIAGNOSIS — N183 Chronic kidney disease, stage 3 unspecified: Secondary | ICD-10-CM | POA: Diagnosis not present

## 2022-12-12 LAB — IRON AND IRON BINDING CAPACITY (CC-WL,HP ONLY)
Iron: 72 ug/dL (ref 28–170)
Saturation Ratios: 20 % (ref 10.4–31.8)
TIBC: 367 ug/dL (ref 250–450)
UIBC: 295 ug/dL (ref 148–442)

## 2022-12-15 DIAGNOSIS — J84112 Idiopathic pulmonary fibrosis: Secondary | ICD-10-CM | POA: Diagnosis not present

## 2022-12-15 DIAGNOSIS — I5043 Acute on chronic combined systolic (congestive) and diastolic (congestive) heart failure: Secondary | ICD-10-CM | POA: Diagnosis not present

## 2022-12-29 ENCOUNTER — Other Ambulatory Visit: Payer: Self-pay | Admitting: Cardiovascular Disease

## 2022-12-29 DIAGNOSIS — I4819 Other persistent atrial fibrillation: Secondary | ICD-10-CM

## 2022-12-30 NOTE — Telephone Encounter (Signed)
Xarelto 20mg  refill request received. Pt is 82 years old, weight-97.1kg, Crea-0.93 on 04/26/22, last seen by Dr. Sallyanne Kuster on 01/26/22, Diagnosis-Afib, [place].19 mL/min; Dose is appropriate based on dosing criteria. Will send in refill to requested pharmacy.

## 2023-01-03 DIAGNOSIS — H2589 Other age-related cataract: Secondary | ICD-10-CM | POA: Diagnosis not present

## 2023-01-03 DIAGNOSIS — H40021 Open angle with borderline findings, high risk, right eye: Secondary | ICD-10-CM | POA: Diagnosis not present

## 2023-01-03 DIAGNOSIS — H401220 Low-tension glaucoma, left eye, stage unspecified: Secondary | ICD-10-CM | POA: Diagnosis not present

## 2023-01-03 DIAGNOSIS — H43813 Vitreous degeneration, bilateral: Secondary | ICD-10-CM | POA: Diagnosis not present

## 2023-01-13 DIAGNOSIS — J84112 Idiopathic pulmonary fibrosis: Secondary | ICD-10-CM | POA: Diagnosis not present

## 2023-01-13 DIAGNOSIS — I5043 Acute on chronic combined systolic (congestive) and diastolic (congestive) heart failure: Secondary | ICD-10-CM | POA: Diagnosis not present

## 2023-02-13 DIAGNOSIS — J84112 Idiopathic pulmonary fibrosis: Secondary | ICD-10-CM | POA: Diagnosis not present

## 2023-02-13 DIAGNOSIS — I5043 Acute on chronic combined systolic (congestive) and diastolic (congestive) heart failure: Secondary | ICD-10-CM | POA: Diagnosis not present

## 2023-03-15 DIAGNOSIS — I5043 Acute on chronic combined systolic (congestive) and diastolic (congestive) heart failure: Secondary | ICD-10-CM | POA: Diagnosis not present

## 2023-03-15 DIAGNOSIS — J84112 Idiopathic pulmonary fibrosis: Secondary | ICD-10-CM | POA: Diagnosis not present

## 2023-03-17 ENCOUNTER — Other Ambulatory Visit: Payer: Self-pay

## 2023-03-17 ENCOUNTER — Telehealth: Payer: Self-pay | Admitting: Pulmonary Disease

## 2023-03-17 ENCOUNTER — Other Ambulatory Visit: Payer: Self-pay | Admitting: Cardiovascular Disease

## 2023-03-17 DIAGNOSIS — J849 Interstitial pulmonary disease, unspecified: Secondary | ICD-10-CM

## 2023-03-17 MED ORDER — METOPROLOL SUCCINATE ER 50 MG PO TB24: 50.0000 mg | ORAL_TABLET | Freq: Every day | ORAL | 0 refills | Status: DC

## 2023-03-17 NOTE — Telephone Encounter (Signed)
Pt calling in requesting a refill for Pirfenidone (ESBRIET) 801 MG TABS [[id number]]  Pt is on her last bottle & wants to know if there is a easier way for her to get her medicine

## 2023-03-21 ENCOUNTER — Other Ambulatory Visit (HOSPITAL_COMMUNITY): Payer: Self-pay

## 2023-03-21 ENCOUNTER — Encounter: Payer: Self-pay | Admitting: Family

## 2023-03-21 MED ORDER — PIRFENIDONE 801 MG PO TABS
801.0000 mg | ORAL_TABLET | Freq: Three times a day (TID) | ORAL | 1 refills | Status: DC
  Filled 2023-03-21: qty 270, 90d supply, fill #0
  Filled 2023-03-22: qty 90, 30d supply, fill #0
  Filled 2023-04-14 (×2): qty 90, 30d supply, fill #1
  Filled 2023-05-25: qty 90, 30d supply, fill #2
  Filled 2023-06-21: qty 90, 30d supply, fill #3
  Filled 2023-07-19: qty 90, 30d supply, fill #4
  Filled 2023-10-05: qty 90, 30d supply, fill #5

## 2023-03-21 NOTE — Telephone Encounter (Signed)
Reached back out to pt. She informs me that whenever she calls [facility] to refill her medication, she ends up getting the runaround and can't ever get to talk to someone who will actually get her scheduled for shipment. I did some investigating and it seems back in October of 2023 the patient was actually denied for name-brand Esbriet through her insurance and thus making her ineligible for the program. A PA was submitted and approved for generic pirfenidone, but nothing further was done beyond that.  Ran a test claim and learned that pt would have a $314.61 copay through her insurance, she states she cannot afford this. I informed pt that I would send a message to Dr. Isaiah Serge to discuss possibility of switching to [facility] as I feel that she will likely be eligible for their free drug program at this time.

## 2023-03-21 NOTE — Telephone Encounter (Addendum)
Patient can stay on pirfenidone for now as there is a pulmonary fibrosis grant open.  Patient enrolled into pulmonary fibrosis grant through [employer] for $5500: Award Period: 09/23/2022 - 03/21/2024 Cardholder: [id number] BIN: [id number] PCN: [id number] Group: [id number] For pharmacy inquiries, contact PDMI at [phone]. For patient inquiries, contact PAF at [phone]  Rx sent to [facility]. Routing to [place] [facility] for onboarding needs  Chesley Mires, PharmD, MPH, BCPS, CPP Clinical Pharmacist (Rheumatology and Pulmonology)

## 2023-03-22 ENCOUNTER — Other Ambulatory Visit (HOSPITAL_COMMUNITY): Payer: Self-pay

## 2023-03-22 ENCOUNTER — Other Ambulatory Visit: Payer: Self-pay

## 2023-03-22 ENCOUNTER — Encounter: Payer: Self-pay | Admitting: Family

## 2023-03-22 NOTE — Telephone Encounter (Signed)
Delivery instructions have been updated in [place], medication will be shipped to patient's home address on 03/23/23.  Rx has been processed in [facility] and  [employer] has been added. Pt has no copay at this time.

## 2023-03-31 ENCOUNTER — Telehealth: Payer: Self-pay | Admitting: Pulmonary Disease

## 2023-03-31 NOTE — Telephone Encounter (Signed)
Pharmacy calling to get prescription sent in for Esbriet

## 2023-03-31 NOTE — Telephone Encounter (Signed)
Patient filling pirfenidone through [facility] now with grant assistance  Chesley Mires, PharmD, MPH, BCPS, CPP Clinical Pharmacist (Rheumatology and Pulmonology)

## 2023-04-04 ENCOUNTER — Other Ambulatory Visit: Payer: Self-pay

## 2023-04-11 ENCOUNTER — Other Ambulatory Visit: Payer: Self-pay | Admitting: Cardiovascular Disease

## 2023-04-12 ENCOUNTER — Other Ambulatory Visit (HOSPITAL_COMMUNITY): Payer: Self-pay

## 2023-04-14 ENCOUNTER — Other Ambulatory Visit: Payer: Self-pay | Admitting: Cardiovascular Disease

## 2023-04-14 ENCOUNTER — Other Ambulatory Visit (HOSPITAL_COMMUNITY): Payer: Self-pay

## 2023-04-15 DIAGNOSIS — J84112 Idiopathic pulmonary fibrosis: Secondary | ICD-10-CM | POA: Diagnosis not present

## 2023-04-15 DIAGNOSIS — I5043 Acute on chronic combined systolic (congestive) and diastolic (congestive) heart failure: Secondary | ICD-10-CM | POA: Diagnosis not present

## 2023-04-17 ENCOUNTER — Other Ambulatory Visit (HOSPITAL_COMMUNITY): Payer: Self-pay

## 2023-04-27 ENCOUNTER — Encounter: Payer: Self-pay | Admitting: Pulmonary Disease

## 2023-04-27 ENCOUNTER — Ambulatory Visit: Payer: Medicare Other | Admitting: Pulmonary Disease

## 2023-04-27 ENCOUNTER — Other Ambulatory Visit: Payer: Self-pay

## 2023-04-27 ENCOUNTER — Other Ambulatory Visit: Payer: Self-pay | Admitting: Pulmonary Disease

## 2023-04-27 VITALS — BP 120/66 | HR 61 | Temp 97.8°F | Ht 60.0 in | Wt 213.0 lb

## 2023-04-27 DIAGNOSIS — J84112 Idiopathic pulmonary fibrosis: Secondary | ICD-10-CM | POA: Diagnosis not present

## 2023-04-27 DIAGNOSIS — R0600 Dyspnea, unspecified: Secondary | ICD-10-CM

## 2023-04-27 DIAGNOSIS — Z5181 Encounter for therapeutic drug level monitoring: Secondary | ICD-10-CM | POA: Diagnosis not present

## 2023-04-27 NOTE — Addendum Note (Signed)
Addended by: Jacquiline Doe on: 04/27/2023 01:55 PM   Modules accepted: Orders

## 2023-04-27 NOTE — Progress Notes (Signed)
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  She was started on supplemental oxygen 1 year ago Has chronic cough.  Denies any wheezing, sputum production, fevers, chills  History significant for atrial fibrillation for which she was on Xarelto.  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  No feather pillows or comforter ILD questionnaire 08/06/2021-negative Smoking history: Never smoker Travel history: No significant travel history Relevant family history: No family history of lung disease  Interim history: Doing okay with Esbriet However she is taking it just twice a day States that dyspnea on exertion is stable.  Continues on supplemental oxygen.  Outpatient Encounter Medications as of 04/27/2023  Medication Sig   atorvastatin (LIPITOR) 20 MG tablet Take 20 mg by mouth daily.   Cholecalciferol (VITAMIN D-3) 5000 units TABS Take 5,000 Units by mouth daily.   dofetilide (TIKOSYN) 500 MCG capsule Take 1 capsule (500 mcg total) by mouth 2 (two) times daily.   FREESTYLE PRECISION NEO TEST test strip USE AS DIRECTED TO TEST BLOOD SUGAR DAILY E11.21   losartan (COZAAR) 50 MG tablet Take 50 mg by mouth daily.   Magnesium Oxide 500 MG TABS Take 500 mg by mouth daily in the afternoon.   metFORMIN (GLUCOPHAGE-XR) 500 MG 24 hr tablet Take 1,000 mg by mouth at bedtime. Taking 1000mg  daily   metoprolol succinate (TOPROL-XL) 50 MG 24 hr tablet TAKE 1  TABLET BY MOUTH EVERY DAY   Pirfenidone (ESBRIET) 801 MG TABS Take 1 tablet (801 mg total) by mouth 3 (three) times daily with meals.   XARELTO 20 MG TABS tablet TAKE 1 TABLET BY MOUTH DAILY WITH SUPPER   No facility-administered encounter medications on file as of 04/27/2023.   Physical Exam: Blood pressure 120/66, pulse 61, temperature 97.8 F (36.6 C), temperature source Oral, height 5' (1.524 m), weight 213 lb (96.6 kg), SpO2 92%. Gen:      No acute distress HEENT:  EOMI, sclera anicteric Neck:     No masses; no thyromegaly Lungs:    Bibasal crackles CV:         Regular rate and rhythm; no murmurs Abd:      + bowel sounds; soft, non-tender; no palpable masses, no distension Ext:    No edema; adequate peripheral perfusion Skin:      Warm and dry; no rash Neuro: alert and oriented x 3 Psych: normal mood and affect   Data Reviewed: Imaging: CT high-resolution 07/21/2021- Worsening of interstitial lung disease and probable UIP pattern, dilatation of pulmonary trunk, moderate cardiomegaly, atherosclerosis and three-vessel coronary disease  CT high-resolution 07/25/2022 Pulmonary pattern of fibrosis which looks unchanged I have reviewed the images personally.  PFTs: 10/23/2017 FVC 1.76 [74%], FEV1 1.66 [93%], F/F 94, TLC 2.65 [59%], DLCO 9.34 [49%] Moderate restriction, severe diffusion defect  04/26/2022 FVC 1.28 [57%], FEV1 1.19 [72%], F/F 93, TLC 2.78 [62%], DLCO 9.62 [58%] Moderate restriction and diffusion defect.  Labs: CTD serologies 08/06/2021-negative proBNP 08/06/2021-742 Hepatic panel 09/16/2021-within  normal limits  Labs from primary care dated 12/15/2021 CBC-WBC 8, hemoglobin 11.3, platelets 228, eosinophils 0.8%, BMP within normal limits.    Hepatic panel 04/26/2022 - within normal limits  Assessment:  Pulmonary fibrosis.   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  No significant CTD symptoms are exposures.  High  probability for being IPF  CTD serologies are negative We have started Esbriet and she is tolerating it well so far.  We reviewed the dosing schedule and she has been instructed to take the medication 3 times daily.  Follow-up repeat monitoring labs  proBNP is minimally elevated.  No evidence of pulmonary hypertension on previous echocardiogram  PFTs reviewed with stable restriction and diffusion defect  High-res CT and PFTs in 4 months  Plan/Recommendations: Continue Esbriet.  Encouraged to take 3 times a day Follow-up in 4 months with CT and PFTs Check CMP, CBC, pro BNP  Chilton Greathouse MD [facility] 04/27/2023, 1:32 PM  CC: Laurann Montana, MD

## 2023-04-27 NOTE — Patient Instructions (Signed)
I am glad you are doing well with your breathing Continue the Esbriet Will check comprehensive metabolic panel, CBC and proBNP for dyspnea today Order high-resolution CT and PFTs in 4 months Return to clinic after these tests

## 2023-04-28 LAB — CBC WITH DIFFERENTIAL/PLATELET
Basophils Absolute: 0 x10E3/uL (ref 0.0–0.2)
Basos: 0 %
EOS (ABSOLUTE): 0.1 x10E3/uL (ref 0.0–0.4)
Eos: 1 %
Hematocrit: 33.3 % — ABNORMAL LOW (ref 34.0–46.6)
Hemoglobin: 11.2 g/dL (ref 11.1–15.9)
Immature Grans (Abs): 0 x10E3/uL (ref 0.0–0.1)
Immature Granulocytes: 0 %
Lymphocytes Absolute: 1.4 x10E3/uL (ref 0.7–3.1)
Lymphs: 19 %
MCH: 32.2 pg (ref 26.6–33.0)
MCHC: 33.6 g/dL (ref 31.5–35.7)
MCV: 96 fL (ref 79–97)
Monocytes Absolute: 0.5 x10E3/uL (ref 0.1–0.9)
Monocytes: 7 %
Neutrophils Absolute: 5.4 x10E3/uL (ref 1.4–7.0)
Neutrophils: 73 %
Platelets: 224 x10E3/uL (ref 150–450)
RBC: 3.48 x10E6/uL — ABNORMAL LOW (ref 3.77–5.28)
RDW: 13 % (ref 11.7–15.4)
WBC: 7.3 x10E3/uL (ref 3.4–10.8)

## 2023-04-28 LAB — COMPREHENSIVE METABOLIC PANEL WITH GFR
ALT: 12 IU/L (ref 0–32)
AST: 16 IU/L (ref 0–40)
Albumin: 3.8 g/dL (ref 3.8–4.8)
Alkaline Phosphatase: 69 IU/L (ref 44–121)
BUN/Creatinine Ratio: 24 (ref 12–28)
BUN: 24 mg/dL (ref 8–27)
Bilirubin Total: 0.4 mg/dL (ref 0.0–1.2)
CO2: 24 mmol/L (ref 20–29)
Calcium: 9.1 mg/dL (ref 8.7–10.3)
Chloride: 101 mmol/L (ref 96–106)
Creatinine, Ser: 1.02 mg/dL — ABNORMAL HIGH (ref 0.57–1.00)
Globulin, Total: 2.6 g/dL (ref 1.5–4.5)
Glucose: 163 mg/dL — ABNORMAL HIGH (ref 70–99)
Potassium: 4.8 mmol/L (ref 3.5–5.2)
Sodium: 139 mmol/L (ref 134–144)
Total Protein: 6.4 g/dL (ref 6.0–8.5)
eGFR: 56 mL/min/1.73 — ABNORMAL LOW

## 2023-04-28 LAB — PRO B NATRIURETIC PEPTIDE: NT-Pro BNP: 858 pg/mL — ABNORMAL HIGH (ref 0–738)

## 2023-05-09 ENCOUNTER — Other Ambulatory Visit (HOSPITAL_COMMUNITY): Payer: Self-pay

## 2023-05-10 ENCOUNTER — Ambulatory Visit: Payer: Medicare Other | Attending: Cardiovascular Disease | Admitting: Cardiovascular Disease

## 2023-05-10 ENCOUNTER — Encounter: Payer: Self-pay | Admitting: Cardiovascular Disease

## 2023-05-10 VITALS — BP 99/51 | HR 68 | Ht 60.0 in | Wt 221.0 lb

## 2023-05-10 DIAGNOSIS — Z5181 Encounter for therapeutic drug level monitoring: Secondary | ICD-10-CM

## 2023-05-10 DIAGNOSIS — I5032 Chronic diastolic (congestive) heart failure: Secondary | ICD-10-CM

## 2023-05-10 DIAGNOSIS — J8410 Pulmonary fibrosis, unspecified: Secondary | ICD-10-CM | POA: Diagnosis not present

## 2023-05-10 DIAGNOSIS — I5043 Acute on chronic combined systolic (congestive) and diastolic (congestive) heart failure: Secondary | ICD-10-CM

## 2023-05-10 DIAGNOSIS — Z7984 Long term (current) use of oral hypoglycemic drugs: Secondary | ICD-10-CM

## 2023-05-10 DIAGNOSIS — E119 Type 2 diabetes mellitus without complications: Secondary | ICD-10-CM

## 2023-05-10 DIAGNOSIS — I469 Cardiac arrest, cause unspecified: Secondary | ICD-10-CM

## 2023-05-10 DIAGNOSIS — D6869 Other thrombophilia: Secondary | ICD-10-CM

## 2023-05-10 DIAGNOSIS — I2781 Cor pulmonale (chronic): Secondary | ICD-10-CM

## 2023-05-10 DIAGNOSIS — Z79899 Other long term (current) drug therapy: Secondary | ICD-10-CM

## 2023-05-10 DIAGNOSIS — I4819 Other persistent atrial fibrillation: Secondary | ICD-10-CM | POA: Diagnosis not present

## 2023-05-10 DIAGNOSIS — I420 Dilated cardiomyopathy: Secondary | ICD-10-CM

## 2023-05-10 DIAGNOSIS — I10 Essential (primary) hypertension: Secondary | ICD-10-CM

## 2023-05-10 NOTE — Patient Instructions (Signed)
Medication Instructions:  No changes *If you need a refill on your cardiac medications before your next appointment, please call your pharmacy*  Testing/Procedures: Your physician has requested that you have an echocardiogram. Echocardiography is a painless test that uses sound waves to create images of your heart. It provides your doctor with information about the size and shape of your heart and how well your heart's chambers and valves are working. This procedure takes approximately one hour. There are no restrictions for this procedure. Please do NOT wear cologne, perfume, aftershave, or lotions (deodorant is allowed). Please arrive 15 minutes prior to your appointment time.    Follow-Up: At [facility], you and your health needs are our priority.  As part of our continuing mission to provide you with exceptional heart care, we have created designated Provider Care Teams.  These Care Teams include your primary Cardiologist (physician) and Advanced Practice Providers (APPs -  Physician Assistants and Nurse Practitioners) who all work together to provide you with the care you need, when you need it.  We recommend signing up for the patient portal called "MyChart".  Sign up information is provided on this After Visit Summary.  MyChart is used to connect with patients for Virtual Visits (Telemedicine).  Patients are able to view lab/test results, encounter notes, upcoming appointments, etc.  Non-urgent messages can be sent to your provider as well.   To learn more about what you can do with MyChart, go to [email or web address].    Your next appointment:   6 month(s)  Provider:   Thurmon Fair, MD

## 2023-05-10 NOTE — Progress Notes (Signed)
Cardiology Office Note:    Date:  05/16/2023   ID:  Carrie Haynes, DOB [birth year], MRN [id number]  PCP:  Laurann Montana, MD  Cardiologist:  Thurmon Fair, MD    Referring MD: Laurann Montana, MD   Chief Complaint  Patient presents with   Atrial Fibrillation      History of Present Illness:    Carrie Haynes is a 82 y.o. 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  She presents today with normal sinus rhythm with occasional PVCs.  She has not had any recent atrial fibrillation that she is aware of.  She continues to use oxygen 24 hours a day (2 L/min at rest, 3 L/min when she is walking).  She is compliant with Xarelto anticoagulation and has not had any bleeding problems or falls.  She is on dofetilide for prevention of atrial fibrillation recurrence and the QTc on today's electrocardiogram is in desirable range at 469 ms.  Her level of exertional dyspnea is unchanged.  She does not have dyspnea at rest as long as she is wearing oxygen.  She denies syncope or recent palpitations.  Does not have chest pain at rest or with activity.  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  Previous pulmonary function tests have shown mild-moderate reduction in FVC (65-75% of predicted, DLCO corrects for alveolar volume).  She did receive amiodarone for a couple of months in 2018.  Neurologist last notes, he believes that obesity is a large part of the problem.  Initially diagnosed with atrial fibrillation with rapid ventricular response in July 2018 when she presented with tachycardia cardiomyopathy.  Her echo showed mildly depressed LVEF at 45% with diffuse hypokinesis and moderate mitral regurgitation, mildly dilated left  atrium, but also a moderately dilated right atrium with moderate to severe tricuspid regurgitation and mild pulmonary hypertension.  Coronary angiography showed no evidence of significant CAD.  Rate control was difficult.  She underwent cardioversion on August 2 unsuccessfully, but eventually after loading with IV amiodarone was successfully cardioverted on August 7. Pulmonary function tests (before treatment with amiodarone) raised concern for restrictive lung disease, thereby making long-term treatment with amiodarone undesirable.  Amiodarone was stopped in November 2018. Follow-up echocardiogram shows complete normalization of left ventricular systolic function, EF 55-60%, with some residual signs of impaired relaxation.  A CT of the chest performed in July 2020 did show evidence of mild-moderate pulmonary fibrosis, but this was unchanged from the previous CT in 2018.    In November 2020 she presented with recurrent shortness of breath and was found to again have atrial fibrillation.  She was in rapid ventricular response but was unaware of the arrhythmia.  Echocardiography shows her EF has again dropped to 45-50%.  She was hospitalized for dofetilide loading, converted to sinus rhythm without electrical cardioversion during antiarrhythmic administration.     Past Medical History:  Diagnosis Date   A-fib (HCC)    Diabetes mellitus without complication (HCC)    Hyperlipemia    Hypertension     Past Surgical History:  Procedure Laterality Date   CARDIOVERSION N/A 05/18/2017   Procedure: CARDIOVERSION;  Surgeon: Wendall Stade, MD;  Location: [facility] ENDOSCOPY;  Service: Cardiovascular;  Laterality: N/A;   CARDIOVERSION N/A 05/23/2017   Procedure: CARDIOVERSION;  Surgeon: Quintella Reichert, MD;  Location:  [facility] ENDOSCOPY;  Service: Cardiovascular;  Laterality: N/A;   CARDIOVERSION N/A 08/28/2019   Procedure: CARDIOVERSION;  Surgeon: Jake Bathe, MD;  Location: [facility] ENDOSCOPY;  Service: Cardiovascular;   Laterality: N/A;   COLONOSCOPY N/A 08/07/2020   Procedure: COLONOSCOPY;  Surgeon: Willis Modena, MD;  Location: [facility] ENDOSCOPY;  Service: Endoscopy;  Laterality: N/A;   ESOPHAGOGASTRODUODENOSCOPY N/A 08/07/2020   Procedure: ESOPHAGOGASTRODUODENOSCOPY (EGD);  Surgeon: Willis Modena, MD;  Location: [facility] ENDOSCOPY;  Service: Endoscopy;  Laterality: N/A;   GIVENS CAPSULE STUDY N/A 08/09/2020   Procedure: GIVENS CAPSULE STUDY;  Surgeon: Willis Modena, MD;  Location: [facility] ENDOSCOPY;  Service: Endoscopy;  Laterality: N/A;   RIGHT/LEFT HEART CATH AND CORONARY ANGIOGRAPHY N/A 05/12/2017   Procedure: Right/Left Heart Cath and Coronary Angiography;  Surgeon: Lyn Records, MD;  Location: [facility] INVASIVE CV LAB;  Service: Cardiovascular;  Laterality: N/A;   TEE WITHOUT CARDIOVERSION N/A 05/18/2017   Procedure: TRANSESOPHAGEAL ECHOCARDIOGRAM (TEE);  Surgeon: Wendall Stade, MD;  Location: [facility] ENDOSCOPY;  Service: Cardiovascular;  Laterality: N/A;    Current Medications: Current Meds  Medication Sig   atorvastatin (LIPITOR) 20 MG tablet Take 20 mg by mouth daily.   Cholecalciferol (VITAMIN D-3) 5000 units TABS Take 5,000 Units by mouth daily.   FREESTYLE PRECISION NEO TEST test strip USE AS DIRECTED TO TEST BLOOD SUGAR DAILY E11.21   losartan (COZAAR) 50 MG tablet Take 50 mg by mouth daily.   Magnesium Oxide 500 MG TABS Take 500 mg by mouth daily in the afternoon.   metFORMIN (GLUCOPHAGE-XR) 500 MG 24 hr tablet Take 500 mg by mouth at bedtime.   metoprolol succinate (TOPROL-XL) 50 MG 24 hr tablet TAKE 1 TABLET BY MOUTH EVERY DAY   Pirfenidone (ESBRIET) 801 MG TABS Take 1 tablet (801 mg total) by mouth 3 (three) times daily with meals.   XARELTO 20 MG TABS tablet TAKE 1 TABLET BY MOUTH DAILY WITH SUPPER   [DISCONTINUED] dofetilide (TIKOSYN) 500 MCG capsule Take 1 capsule (500 mcg total) by mouth 2 (two) times daily.     Allergies:   Calcium channel blockers, Estrogens conjugated, and Nystatin   Social  History   Socioeconomic History   Marital status: Widowed    Spouse name: Not on file   Number of children: Not on file   Years of education: Not on file   Highest education level: Not on file  Occupational History   Not on file  Tobacco Use   Smoking status: Never   Smokeless tobacco: Never  Vaping Use   Vaping status: Never Used  Substance and Sexual Activity   Alcohol use: No   Drug use: No   Sexual activity: Not Currently  Other Topics Concern   Not on file  Social History Narrative   Not on file   Social Determinants of Health   Financial Resource Strain: Not on file  Food Insecurity: Not on file  Transportation Needs: Not on file  Physical Activity: Not on file  Stress: Not on file  Social Connections: Not on file     Family History: The patient's family history includes Breast cancer in an other family member; Heart attack in her sister; Hypertension in her father. ROS:   Please see the history of present illness.    All other systems are reviewed and are negative.  EKGs/Labs/Other Studies Reviewed:    The following studies were reviewed today:  ECHO 09/03/2019:  1. Left ventricular ejection fraction, by visual estimation, is 45 to 50%. Marland Kitchen..  ECHO 10/25/2019:   1. Left ventricular ejection fraction, by visual estimation, is 55 to  60%. The left ventricle has normal function. There is no left ventricular  hypertrophy.   2. Left ventricular diastolic parameters are consistent with Grade I  diastolic dysfunction (impaired relaxation).   3. The left ventricle has no regional wall motion abnormalities.   4. Global right ventricle has mildly reduced systolic function.The right  ventricular size is mildly enlarged. No increase in right ventricular wall  thickness.   5. Left atrial size was mildly dilated.   6. Right atrial size was normal.   7. Mild mitral annular calcification.   8. The mitral valve is normal in structure. Mild mitral valve   regurgitation. No evidence of mitral stenosis.   9. The tricuspid valve is normal in structure.  10. The aortic valve is tricuspid. Aortic valve regurgitation is not  visualized. Mild to moderate aortic valve sclerosis/calcification without  any evidence of aortic stenosis.  11. The tricuspid regurgitant velocity is 3.35 m/s, and with an assumed  right atrial pressure of 3 mmHg, the estimated right ventricular systolic  pressure is moderately elevated at 47.9 mmHg.  12. The inferior vena cava is normal in size with greater than 50%  respiratory variability, suggesting right atrial pressure of 3 mmHg.   EKG:  EKG is ordered today.  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, borderline generalized low voltage, QTc 469 ms    Recent Labs: 04/27/2023: ALT 12; BUN 24; Creatinine, Ser 1.02; Hemoglobin 11.2; NT-Pro BNP 858; Platelets 224; Potassium 4.8; Sodium 139   06/08/2020 Hemoglobin 12.3, creatinine 1.1, potassium 5.0, normal liver function tests, TSH 2.83   Recent Lipid Panel    Component Value Date/Time   CHOL 165 10/10/2019 1025   TRIG 96 10/10/2019 1025   HDL 52 10/10/2019 1025   CHOLHDL 3.2 10/10/2019 1025   CHOLHDL 2.6 05/13/2017 0331   VLDL 10 05/13/2017 0331   LDLCALC 95 10/10/2019 1025   06/08/2020 Total cholesterol 156, HDL 53, LDL 80, triglycerides 126  12/12/2022 Cholesterol 181, HDL 65, LDL 99, triglycerides 97, hemoglobin A1c 7.0% Physical Exam:    VS:  BP (!) 99/51 (BP Location: Left Arm, Patient Position: Sitting, Cuff Size: Large)   Pulse 68   Ht 5' (1.524 m)   Wt 221 lb (100.2 kg)   SpO2 91%   BMI 43.16 kg/m     Wt Readings from Last 3 Encounters:  05/10/23 221 lb (100.2 kg)  04/27/23 213 lb (96.6 kg)  12/09/22 214 lb (97.1 kg)      General: Alert, oriented x3, no distress, morbidly obese.  Appears comfortable while wearing oxygen. Head: no evidence of trauma, PERRL, EOMI, no exophtalmos or lid lag, no  myxedema, no xanthelasma; normal ears, nose and oropharynx Neck: normal jugular venous pulsations and no hepatojugular reflux; brisk carotid pulses without delay and no carotid bruits Chest: Bilateral Velcro-like dry crackles in both lung bases, do not clear with cough Cardiovascular: normal position and quality of the apical impulse, regular rhythm, normal first and second heart sounds, no murmurs, rubs or gallops Abdomen: no tenderness or distention, no masses by palpation, no abnormal pulsatility or arterial bruits, normal bowel sounds, no hepatosplenomegaly Extremities: no clubbing, cyanosis or edema; 2+ radial, ulnar and brachial pulses bilaterally; 2+ right femoral, posterior tibial and dorsalis pedis pulses; 2+ left femoral, posterior tibial and dorsalis pedis pulses; no subclavian or femoral bruits Neurological: grossly nonfocal Psych: Normal  mood and affect     ASSESSMENT:    1. Persistent atrial fibrillation (HCC)   2. Chronic diastolic heart failure (HCC)   3. Pulmonary fibrosis (HCC)   4. Cor pulmonale, chronic (HCC)   5. Essential hypertension   6. Morbid obesity due to excess calories (HCC)   7. Diabetes mellitus without complication (HCC)   8. Encounter for monitoring dofetilide therapy   9. Acquired thrombophilia (HCC)      PLAN:    In order of problems listed above:  AFib: Has good arrhythmia suppression with dofetilide.  She has biatrial dilation.  Continue anticoagulation.  No history of stroke/TIA, but CHA2DS2-VASc 5 (age 43, gender, hypertension, CHF).   CHF: Functional status is impaired by pulmonary problems.  Appears clinically euvolemic without diuretics.  Previous history of tachycardia cardiomyopathy; each time her ejection fraction has dropped due to persistent tachycardia, it has resolved after correction of the arrhythmia.  Recheck her echocardiogram to evaluate for any signs of elevated left atrial pressure contributing to her shortness of breath.  proBNP  performed 2 weeks ago after her pulmonary clinic visit was 858, slightly higher than a year ago at 68. 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  Scheduled for repeat PFTs this year which have not yet been performed.  Last saw Dr. Isaiah Serge just a couple of weeks ago.  Remains on treatment with pirfenidone.  She was only on amiodarone for about 3 months and restrictive lung disease abnormalities on her PFTs were present before amiodarone was ever initiated.   Clearly some part of her restriction is due to her obesity, but high-resolution CT also showed marked progression of widespread areas of groundglass attenuation and evidence of fibrosis, without frank honeycombing.  On chronic O2 at 2-3 L/min. Cor pulmonale: Multifactorial (obesity, restrictive lung disease, may be OSA), she does not have any signs of right heart failure on exam today.  She does not have daytime hypersomnolence.  Chest CT also demonstrated dilation of the pulmonary arteries consistent with pulmonary artery hypertension. HTN: well-controlled on current medications. Obesity: Morbid obesity is part of her restrictive lung disease, but interstitial lung disease appears to be the dominant problem. DM: Fair control with hemoglobin A1c 7.0% in February. Dofetilide monitoring: QTC in normal range.  Recent potassium and renal function were normal.  Reviewed the importance of being aware of potential drug interactions with this medication. Anticoagulation: No overt bleeding on Xarelto anticoagulation.  Minimal reduction in hemoglobin level, normocytic normochromic with normal iron stores.    Medication Adjustments/Labs and Tests Ordered: Current medicines are reviewed at length with the patient today.  Concerns regarding medicines are outlined above.  Orders Placed This  Encounter  Procedures   EKG 12-Lead   ECHOCARDIOGRAM COMPLETE    No orders of the defined types were placed in this encounter.   Patient Instructions   Medication Instructions:  No changes *If you need a refill on your cardiac medications before your next appointment, please call your pharmacy*  Testing/Procedures: Your physician has requested that you have an echocardiogram. Echocardiography is a painless test that uses sound waves to create images of your heart. It provides your doctor with information about the size and shape of your heart and how well your heart's chambers and valves are working. This procedure takes approximately one hour. There are no restrictions for this procedure. Please do NOT wear cologne,  perfume, aftershave, or lotions (deodorant is allowed). Please arrive 15 minutes prior to your appointment time.    Follow-Up: At [facility], you and your health needs are our priority.  As part of our continuing mission to provide you with exceptional heart care, we have created designated Provider Care Teams.  These Care Teams include your primary Cardiologist (physician) and Advanced Practice Providers (APPs -  Physician Assistants and Nurse Practitioners) who all work together to provide you with the care you need, when you need it.  We recommend signing up for the patient portal called "MyChart".  Sign up information is provided on this After Visit Summary.  MyChart is used to connect with patients for Virtual Visits (Telemedicine).  Patients are able to view lab/test results, encounter notes, upcoming appointments, etc.  Non-urgent messages can be sent to your provider as well.   To learn more about what you can do with MyChart, go to [email or web address].    Your next appointment:   6 month(s)  Provider:   Thurmon Fair, MD       Signed, Thurmon Fair, MD  05/16/2023 11:16 AM    [facility]

## 2023-05-11 ENCOUNTER — Other Ambulatory Visit: Payer: Self-pay | Admitting: Cardiovascular Disease

## 2023-05-12 DIAGNOSIS — H2589 Other age-related cataract: Secondary | ICD-10-CM | POA: Diagnosis not present

## 2023-05-12 DIAGNOSIS — H401221 Low-tension glaucoma, left eye, mild stage: Secondary | ICD-10-CM | POA: Diagnosis not present

## 2023-05-12 DIAGNOSIS — H2511 Age-related nuclear cataract, right eye: Secondary | ICD-10-CM | POA: Diagnosis not present

## 2023-05-12 DIAGNOSIS — H40021 Open angle with borderline findings, high risk, right eye: Secondary | ICD-10-CM | POA: Diagnosis not present

## 2023-05-15 DIAGNOSIS — J84112 Idiopathic pulmonary fibrosis: Secondary | ICD-10-CM | POA: Diagnosis not present

## 2023-05-15 DIAGNOSIS — I5043 Acute on chronic combined systolic (congestive) and diastolic (congestive) heart failure: Secondary | ICD-10-CM | POA: Diagnosis not present

## 2023-05-16 ENCOUNTER — Encounter: Payer: Self-pay | Admitting: Cardiovascular Disease

## 2023-05-18 ENCOUNTER — Ambulatory Visit (HOSPITAL_COMMUNITY): Payer: Medicare Other | Attending: Cardiology

## 2023-05-18 DIAGNOSIS — I5032 Chronic diastolic (congestive) heart failure: Secondary | ICD-10-CM | POA: Diagnosis not present

## 2023-05-18 LAB — ECHOCARDIOGRAM COMPLETE
AR max vel: 1.65 cm2
AV Area VTI: 1.85 cm2
AV Area mean vel: 1.56 cm2
AV Mean grad: 5 mmHg
AV Peak grad: 8.6 mmHg
Ao pk vel: 1.47 m/s
Area-P 1/2: 2.93 cm2
S' Lateral: 2.6 cm

## 2023-05-18 MED ORDER — PERFLUTREN LIPID MICROSPHERE
1.0000 mL | INTRAVENOUS | Status: AC | PRN
Start: 2023-05-18 — End: 2023-05-18
  Administered 2023-05-18: 1 mL via INTRAVENOUS

## 2023-05-23 ENCOUNTER — Other Ambulatory Visit (HOSPITAL_COMMUNITY): Payer: Self-pay

## 2023-05-25 ENCOUNTER — Other Ambulatory Visit (HOSPITAL_COMMUNITY): Payer: Self-pay

## 2023-05-26 ENCOUNTER — Other Ambulatory Visit (HOSPITAL_COMMUNITY): Payer: Self-pay

## 2023-06-01 ENCOUNTER — Telehealth: Payer: Self-pay | Admitting: Emergency Medicine

## 2023-06-01 NOTE — Telephone Encounter (Signed)
Called pt to discuss available dates for Right Heart Cath. MyChart visit will be needed and blood work prior to procedure.   Spoke to pt and pt's husband. They prefer Friday 06/23/23- scheduled the procedure- It will be on Friday 06/23/23 at 0900 with Dr Gala Romney- Pt will need to be there at 0700.  They agreed to a MyChart visit tomorrow 06/02/23 at 1000 with Dr Royann Shivers, to discuss the procedure (they will use the husband's phone, he has access on his phone and verbalized understanding of how to do a visit)  I will call after the MyChart visit to go over the pre procedural instructions and also send instructions through MyChart.   Pt will be getting lab work on 06/06/23 at [facility], so she should not need to get further lab work prior to this procedure.   They verbalized understanding.   Message sent to pre-cert for procedure.

## 2023-06-02 ENCOUNTER — Telehealth: Payer: Self-pay | Admitting: Emergency Medicine

## 2023-06-02 ENCOUNTER — Encounter: Payer: Self-pay | Admitting: Cardiovascular Disease

## 2023-06-02 ENCOUNTER — Ambulatory Visit: Payer: Medicare Other | Admitting: Cardiovascular Disease

## 2023-06-02 VITALS — BP 120/72 | HR 63 | Ht 60.0 in | Wt 209.5 lb

## 2023-06-02 DIAGNOSIS — I2721 Secondary pulmonary arterial hypertension: Secondary | ICD-10-CM | POA: Diagnosis not present

## 2023-06-02 DIAGNOSIS — I2781 Cor pulmonale (chronic): Secondary | ICD-10-CM | POA: Diagnosis not present

## 2023-06-02 DIAGNOSIS — J9611 Chronic respiratory failure with hypoxia: Secondary | ICD-10-CM

## 2023-06-02 DIAGNOSIS — J84112 Idiopathic pulmonary fibrosis: Secondary | ICD-10-CM

## 2023-06-02 DIAGNOSIS — I480 Paroxysmal atrial fibrillation: Secondary | ICD-10-CM

## 2023-06-02 DIAGNOSIS — I5032 Chronic diastolic (congestive) heart failure: Secondary | ICD-10-CM

## 2023-06-02 NOTE — Patient Instructions (Addendum)
  [facility] A DEPT OF [facility] AT [facility] [address] [place] [address] Dept: [phone] Loc: [phone]  FEATHER EUSTAQUIO  06/02/2023  You are scheduled for a Cardiac Catheterization on Friday, September 6 with Dr. Arvilla Meres.  1. Please arrive at the [facility] (Main Entrance A) at [facility]: [address] [place], [place] [address] at 7:00 AM (This time is  hour(s) before your procedure to ensure your preparation). Free valet parking service is available. You will check in at ADMITTING. The support person will be asked to wait in the waiting room.  It is OK to have someone drop you off and come back when you are ready to be discharged.    Special note: Every effort is made to have your procedure done on time. Please understand that emergencies sometimes delay scheduled procedures.  2. Diet: Do not eat solid foods after midnight.  The patient may have clear liquids until 5am upon the day of the procedure.  3. Labs: Can use lab work being drawn 06/06/23  4. Medication instructions in preparation for your procedure: You do not need to hold any of your medications. On the morning of procedure, you can take medication with sips of water.   5. Plan to go home the same day, you will only stay overnight if medically necessary. 6. Bring a current list of your medications and current insurance cards. 7. You MUST have a responsible person to drive you home. 8. Someone MUST be with you the first 24 hours after you arrive home or your discharge will be delayed. 9. Please wear clothes that are easy to get on and off and wear slip-on shoes.  Thank you for allowing [place] to care for you!   -- [place]

## 2023-06-02 NOTE — H&P (View-Only) (Signed)
Virtual Visit via Video Note   Because of Carrie Haynes co-morbid illnesses, she is at least at moderate risk for complications without adequate follow up.  This format is felt to be most appropriate for this patient at this time.  All issues noted in this document were discussed and addressed.  A limited physical exam was performed with this format.  Please refer to the patient's chart for her consent to telehealth for [facility].       Date:  06/02/2023   ID:  Carrie Haynes, DOB [birth year], MRN [id number] The patient was identified using 2 identifiers.  Patient Location: Home Provider Location: Office/Clinic   PCP:  Laurann Montana, MD   [facility] Providers Cardiologist:  Thurmon Fair, MD     Evaluation Performed:  Follow-Up Visit  Chief Complaint:  schedule right heart catheterization.  History of Present Illness:    Carrie Haynes is a 82 y.o. 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   Her recent echocardiogram showed significant pulmonary artery hypertension with an estimated systolic PA pressure of 67 mmHg.  Although she has mild diastolic dysfunction, there is no evidence of elevated left heart pressures.  Left ventricular systolic function is normal.  She does not have any significant valvular abnormalities other than moderate tricuspid regurgitation.  Discussed with her pulmonary specialist, Dr. Isaiah Serge who recommended right heart catheterization to confirm the pulmonary hypertension, potentially start her on treatment with inhaled treprostinil.  Discussed the right heart catheterization procedure in detail with [name] today.  Have scheduled her with Dr. Gala Romney on September 6 (she  prefers to have the procedure done after she has cataract surgery).  Her atrial fibrillation has been well-controlled on dofetilide (recent QTc 469 ms) and she is anticoagulated with Xarelto (no serious bleeding complications).  She is on oxygen chronically (2 L/min at rest, 3 L/min with activity).   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  Previous pulmonary function tests have shown mild-moderate reduction in FVC (65-75% of predicted, DLCO corrects for alveolar volume).  She did receive amiodarone for a couple of months in 2018.  Neurologist last notes, he believes that obesity is a large part of the problem.   Initially diagnosed with atrial fibrillation with rapid ventricular response in July 2018 when she presented with tachycardia cardiomyopathy.  Her echo showed mildly depressed LVEF at 45% with diffuse hypokinesis and moderate mitral regurgitation, mildly dilated left atrium, but also a moderately dilated right atrium with moderate to severe tricuspid regurgitation and mild pulmonary hypertension.  Coronary angiography showed no evidence of significant CAD.  Rate control was difficult.  She underwent cardioversion on August 2 unsuccessfully, but eventually after loading with IV amiodarone was successfully cardioverted on August 7. Pulmonary function tests (before treatment with amiodarone) raised concern for restrictive lung disease, thereby making long-term treatment with amiodarone undesirable.  Amiodarone was stopped in November 2018. Follow-up echocardiogram shows complete normalization of left ventricular systolic function, EF 55-60%, with some residual signs of impaired relaxation.  A CT of the chest performed in July 2020 did show evidence of mild-moderate pulmonary fibrosis, but this was unchanged from the previous CT in 2018.     In November 2020 she presented with recurrent shortness of breath and was found to again have atrial fibrillation.  She was in rapid  ventricular response but was unaware of the arrhythmia.  Echocardiography shows her EF has again dropped to 45-50%.  She was hospitalized for dofetilide loading, converted to sinus rhythm without electrical cardioversion during antiarrhythmic administration.   Past Medical History:  Diagnosis Date   A-fib (HCC)    Diabetes mellitus without complication (HCC)    Hyperlipemia    Hypertension    Past Surgical History:  Procedure Laterality Date   CARDIOVERSION N/A 05/18/2017   Procedure: CARDIOVERSION;  Surgeon: Wendall Stade, MD;  Location: [facility] ENDOSCOPY;  Service: Cardiovascular;  Laterality: N/A;   CARDIOVERSION N/A 05/23/2017   Procedure: CARDIOVERSION;  Surgeon: Quintella Reichert, MD;  Location: [facility] ENDOSCOPY;  Service: Cardiovascular;  Laterality: N/A;   CARDIOVERSION N/A 08/28/2019   Procedure: CARDIOVERSION;  Surgeon: Jake Bathe, MD;  Location[facility] ENDOSCOPY;  Service: Cardiovascular;  Laterality: N/A;   COLONOSCOPY N/A 08/07/2020   Procedure: COLONOSCOPY;  Surgeon: Willis Modena, MD;  Location: [facility] ENDOSCOPY;  Service: Endoscopy;  Laterality: N/A;   ESOPHAGOGASTRODUODENOSCOPY N/A 08/07/2020   Procedure: ESOPHAGOGASTRODUODENOSCOPY (EGD);  Surgeon: Willis Modena, MD;  Location: [facility] ENDOSCOPY;  Service: Endoscopy;  Laterality: N/A;   GIVENS CAPSULE STUDY N/A 08/09/2020   Procedure: GIVENS CAPSULE STUDY;  Surgeon: Willis Modena, MD;  Location: [facility] ENDOSCOPY;  Service: Endoscopy;  Laterality: N/A;   RIGHT/LEFT HEART CATH AND CORONARY ANGIOGRAPHY N/A 05/12/2017   Procedure: Right/Left Heart Cath and Coronary Angiography;  Surgeon: Lyn Records, MD;  Location: [facility] INVASIVE CV LAB;  Service: Cardiovascular;  Laterality: N/A;   TEE WITHOUT CARDIOVERSION N/A 05/18/2017   Procedure: TRANSESOPHAGEAL ECHOCARDIOGRAM (TEE);  Surgeon: Wendall Stade, MD;  Location: [facility] ENDOSCOPY;  Service: Cardiovascular;  Laterality: N/A;     No outpatient medications have been marked as taking for the 06/02/23  encounter (Video Visit) with Lynnsie Linders, MD.     Allergies:   Calcium channel blockers, Estrogens conjugated, and Nystatin   Social History   Tobacco Use   Smoking status: Never   Smokeless tobacco: Never  Vaping Use   Vaping status: Never Used  Substance Use Topics   Alcohol use: No   Drug use: No     Family Hx: The patient's family history includes Breast cancer in an other family member; Heart attack in her sister; Hypertension in her father.  ROS:   Please see the history of present illness.     All other systems reviewed and are negative.   Prior CV studies:   The following studies were reviewed today:  Echocardiogram 05/18/2023   1. Left ventricular ejection fraction, by estimation, is 55 to 60%. The  left ventricle has normal function. The left ventricle has no regional  wall motion abnormalities. Left ventricular diastolic parameters are  consistent with Grade I diastolic  dysfunction (impaired relaxation).   2. Right ventricular systolic function is normal. The right ventricular  size is mildly enlarged. There is severely elevated pulmonary artery  systolic pressure. The estimated right ventricular systolic pressure is  67.3 mmHg.   3. Right atrial size was mildly dilated.   4. The mitral valve is normal in structure. Mild mitral valve  regurgitation. No evidence of mitral stenosis.   5. Tricuspid valve regurgitation is moderate.   6. The aortic valve is tricuspid. Aortic valve regurgitation is not  visualized. Aortic valve sclerosis/calcification is present, without any  evidence of aortic stenosis. Aortic valve area, by VTI measures 1.85 cm.  Aortic valve mean gradient measures  5.0  mmHg. Aortic valve Vmax measures 1.47 m/s.   7. The inferior vena cava is normal in size with greater than 50%  respiratory variability, suggesting right atrial pressure of 3 mmHg.   Labs/Other Tests and Data Reviewed:    EKG:  No ECG reviewed.  Recent  Labs: 04/27/2023: ALT 12; BUN 24; Creatinine, Ser 1.02; Hemoglobin 11.2; NT-Pro BNP 858; Platelets 224; Potassium 4.8; Sodium 139   Recent Lipid Panel Lab Results  Component Value Date/Time   CHOL 165 10/10/2019 10:25 AM   TRIG 96 10/10/2019 10:25 AM   HDL 52 10/10/2019 10:25 AM   CHOLHDL 3.2 10/10/2019 10:25 AM   CHOLHDL 2.6 05/13/2017 03:31 AM   LDLCALC 95 10/10/2019 10:25 AM    Wt Readings from Last 3 Encounters:  06/02/23 209 lb 8 oz (95 kg)  05/10/23 221 lb (100.2 kg)  04/27/23 213 lb (96.6 kg)     Risk Assessment/Calculations:    CHA2DS2-VASc Score = 6   This indicates a 9.7% annual risk of stroke. The patient's score is based upon: CHF History: 1 HTN History: 1 Diabetes History: 1 Stroke History: 0 Vascular Disease History: 0 Age Score: 2 Gender Score: 1         Objective:    Vital Signs:  BP 120/72 (BP Location: Left Arm, Patient Position: Sitting)   Pulse 63   Ht 5' (1.524 m)   Wt 209 lb 8 oz (95 kg)   SpO2 (!) 89% Comment: on 2L [place]  BMI 40.92 kg/m    VITAL SIGNS:  reviewed GEN:  no acute distress EYES:  sclerae anicteric, EOMI - Extraocular Movements Intact RESPIRATORY:  normal respiratory effort, symmetric expansion CARDIOVASCULAR:  no peripheral edema SKIN:  no rash, lesions or ulcers. MUSCULOSKELETAL:  no obvious deformities. 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  Dilated pulmonary arteries on CT.  By echo estimated systolic PA pressure over 60 mmHg.  Scheduled for right heart catheterization with Dr. Gala Romney on September 6.  Possible candidate for inhaled treprostinil. 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 Isaiah Serge.  On chronic treatment with pirfenidone. Paroxysmal atrial fibrillation: With previous episodes of tachycardia cardiomyopathy.  Currently in sinus rhythm on dofetilide.  QTc in  desirable range.  On appropriate chronic anticoagulation. CHF: There are some age-related changes and diastolic function noted on the echocardiogram, but I do not think this is a major component of her pulmonary hypertension.     Informed Consent   Shared Decision Making/Informed Consent The risks [stroke (1 in 1000), death (1 in 1000), kidney failure [usually temporary] (1 in 500), bleeding (1 in 200), allergic reaction [possibly serious] (1 in 200)], benefits (diagnostic support and management of coronary artery disease) and alternatives of a cardiac catheterization were discussed in detail with Ms. Moffa and she is willing to proceed.        Time:   Today, I have spent 23 minutes with the patient with telehealth technology discussing the above problems.     Medication Adjustments/Labs and Tests Ordered: Current medicines are reviewed at length with the patient today.  Concerns regarding medicines are outlined above.   Tests Ordered: No orders of the defined types were placed in this encounter.   Medication Changes: No orders of the defined types were placed in this encounter.     Signed, Thurmon Fair, MD  06/02/2023 11:37  AM    [facility]

## 2023-06-02 NOTE — Progress Notes (Signed)
Virtual Visit via Video Note   Because of [name] co-morbid illnesses, she is at least at moderate risk for complications without adequate follow up.  This format is felt to be most appropriate for this patient at this time.  All issues noted in this document were discussed and addressed.  A limited physical exam was performed with this format.  Please refer to the patient's chart for her consent to telehealth for [facility].       Date:  06/02/2023   ID:  Carrie Haynes, DOB [birth year], MRN [id number] The patient was identified using 2 identifiers.  Patient Location: Home Provider Location: Office/Clinic   PCP:  Laurann Montana, MD   [facility] Providers Cardiologist:  Thurmon Fair, MD     Evaluation Performed:  Follow-Up Visit  Chief Complaint:  schedule right heart catheterization.  History of Present Illness:    Carrie Haynes is a 82 y.o. 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   Her recent echocardiogram showed significant pulmonary artery hypertension with an estimated systolic PA pressure of 67 mmHg.  Although she has mild diastolic dysfunction, there is no evidence of elevated left heart pressures.  Left ventricular systolic function is normal.  She does not have any significant valvular abnormalities other than moderate tricuspid regurgitation.  Discussed with her pulmonary specialist, Dr. Isaiah Serge who recommended right heart catheterization to confirm the pulmonary hypertension, potentially start her on treatment with inhaled treprostinil.  Discussed the right heart catheterization procedure in detail with [name] today.  Have scheduled her with Dr. Gala Romney on September 6 (she  prefers to have the procedure done after she has cataract surgery).  Her atrial fibrillation has been well-controlled on dofetilide (recent QTc 469 ms) and she is anticoagulated with Xarelto (no serious bleeding complications).  She is on oxygen chronically (2 L/min at rest, 3 L/min with activity).   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  Previous pulmonary function tests have shown mild-moderate reduction in FVC (65-75% of predicted, DLCO corrects for alveolar volume).  She did receive amiodarone for a couple of months in 2018.  Neurologist last notes, he believes that obesity is a large part of the problem.   Initially diagnosed with atrial fibrillation with rapid ventricular response in July 2018 when she presented with tachycardia cardiomyopathy.  Her echo showed mildly depressed LVEF at 45% with diffuse hypokinesis and moderate mitral regurgitation, mildly dilated left atrium, but also a moderately dilated right atrium with moderate to severe tricuspid regurgitation and mild pulmonary hypertension.  Coronary angiography showed no evidence of significant CAD.  Rate control was difficult.  She underwent cardioversion on August 2 unsuccessfully, but eventually after loading with IV amiodarone was successfully cardioverted on August 7. Pulmonary function tests (before treatment with amiodarone) raised concern for restrictive lung disease, thereby making long-term treatment with amiodarone undesirable.  Amiodarone was stopped in November 2018. Follow-up echocardiogram shows complete normalization of left ventricular systolic function, EF 55-60%, with some residual signs of impaired relaxation.  A CT of the chest performed in July 2020 did show evidence of mild-moderate pulmonary fibrosis, but this was unchanged from the previous CT in 2018.     In November 2020 she presented with recurrent shortness of breath and was found to again have atrial fibrillation.  She was in rapid  ventricular response but was unaware of the arrhythmia.  Echocardiography shows her EF has again dropped to 45-50%.  She was hospitalized for dofetilide loading, converted to sinus rhythm without electrical cardioversion during antiarrhythmic administration.   Past Medical History:  Diagnosis Date   A-fib (HCC)    Diabetes mellitus without complication (HCC)    Hyperlipemia    Hypertension    Past Surgical History:  Procedure Laterality Date   CARDIOVERSION N/A 05/18/2017   Procedure: CARDIOVERSION;  Surgeon: Wendall Stade, MD;  Location: [facility] ENDOSCOPY;  Service: Cardiovascular;  Laterality: N/A;   CARDIOVERSION N/A 05/23/2017   Procedure: CARDIOVERSION;  Surgeon: Quintella Reichert, MD;  Location: [facility] ENDOSCOPY;  Service: Cardiovascular;  Laterality: N/A;   CARDIOVERSION N/A 08/28/2019   Procedure: CARDIOVERSION;  Surgeon: Jake Bathe, MD;  Location: [facility] ENDOSCOPY;  Service: Cardiovascular;  Laterality: N/A;   COLONOSCOPY N/A 08/07/2020   Procedure: COLONOSCOPY;  Surgeon: Willis Modena, MD;  Location: [facility] ENDOSCOPY;  Service: Endoscopy;  Laterality: N/A;   ESOPHAGOGASTRODUODENOSCOPY N/A 08/07/2020   Procedure: ESOPHAGOGASTRODUODENOSCOPY (EGD);  Surgeon: Willis Modena, MD;  Location: [facility] ENDOSCOPY;  Service: Endoscopy;  Laterality: N/A;   GIVENS CAPSULE STUDY N/A 08/09/2020   Procedure: GIVENS CAPSULE STUDY;  Surgeon: Willis Modena, MD;  Location: [facility] ENDOSCOPY;  Service: Endoscopy;  Laterality: N/A;   RIGHT/LEFT HEART CATH AND CORONARY ANGIOGRAPHY N/A 05/12/2017   Procedure: Right/Left Heart Cath and Coronary Angiography;  Surgeon: Lyn Records, MD;  Location: [facility] INVASIVE CV LAB;  Service: Cardiovascular;  Laterality: N/A;   TEE WITHOUT CARDIOVERSION N/A 05/18/2017   Procedure: TRANSESOPHAGEAL ECHOCARDIOGRAM (TEE);  Surgeon: Wendall Stade, MD;  Location: [facility] ENDOSCOPY;  Service: Cardiovascular;  Laterality: N/A;     No outpatient medications have been marked as taking for the 06/02/23  encounter (Video Visit) with Lynnsie Linders, MD.     Allergies:   Calcium channel blockers, Estrogens conjugated, and Nystatin   Social History   Tobacco Use   Smoking status: Never   Smokeless tobacco: Never  Vaping Use   Vaping status: Never Used  Substance Use Topics   Alcohol use: No   Drug use: No     Family Hx: The patient's family history includes Breast cancer in an other family member; Heart attack in her sister; Hypertension in her father.  ROS:   Please see the history of present illness.     All other systems reviewed and are negative.   Prior CV studies:   The following studies were reviewed today:  Echocardiogram 05/18/2023   1. Left ventricular ejection fraction, by estimation, is 55 to 60%. The  left ventricle has normal function. The left ventricle has no regional  wall motion abnormalities. Left ventricular diastolic parameters are  consistent with Grade I diastolic  dysfunction (impaired relaxation).   2. Right ventricular systolic function is normal. The right ventricular  size is mildly enlarged. There is severely elevated pulmonary artery  systolic pressure. The estimated right ventricular systolic pressure is  67.3 mmHg.   3. Right atrial size was mildly dilated.   4. The mitral valve is normal in structure. Mild mitral valve  regurgitation. No evidence of mitral stenosis.   5. Tricuspid valve regurgitation is moderate.   6. The aortic valve is tricuspid. Aortic valve regurgitation is not  visualized. Aortic valve sclerosis/calcification is present, without any  evidence of aortic stenosis. Aortic valve area, by VTI measures 1.85 cm.  Aortic valve mean gradient measures  5.0  mmHg. Aortic valve Vmax measures 1.47 m/s.   7. The inferior vena cava is normal in size with greater than 50%  respiratory variability, suggesting right atrial pressure of 3 mmHg.   Labs/Other Tests and Data Reviewed:    EKG:  No ECG reviewed.  Recent  Labs: 04/27/2023: ALT 12; BUN 24; Creatinine, Ser 1.02; Hemoglobin 11.2; NT-Pro BNP 858; Platelets 224; Potassium 4.8; Sodium 139   Recent Lipid Panel Lab Results  Component Value Date/Time   CHOL 165 10/10/2019 10:25 AM   TRIG 96 10/10/2019 10:25 AM   HDL 52 10/10/2019 10:25 AM   CHOLHDL 3.2 10/10/2019 10:25 AM   CHOLHDL 2.6 05/13/2017 03:31 AM   LDLCALC 95 10/10/2019 10:25 AM    Wt Readings from Last 3 Encounters:  06/02/23 209 lb 8 oz (95 kg)  05/10/23 221 lb (100.2 kg)  04/27/23 213 lb (96.6 kg)     Risk Assessment/Calculations:    CHA2DS2-VASc Score = 6   This indicates a 9.7% annual risk of stroke. The patient's score is based upon: CHF History: 1 HTN History: 1 Diabetes History: 1 Stroke History: 0 Vascular Disease History: 0 Age Score: 2 Gender Score: 1         Objective:    Vital Signs:  BP 120/72 (BP Location: Left Arm, Patient Position: Sitting)   Pulse 63   Ht 5' (1.524 m)   Wt 209 lb 8 oz (95 kg)   SpO2 (!) 89% Comment: on 2L [place]  BMI 40.92 kg/m    VITAL SIGNS:  reviewed GEN:  no acute distress EYES:  sclerae anicteric, EOMI - Extraocular Movements Intact RESPIRATORY:  normal respiratory effort, symmetric expansion CARDIOVASCULAR:  no peripheral edema SKIN:  no rash, lesions or ulcers. MUSCULOSKELETAL:  no obvious deformities. 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  Dilated pulmonary arteries on CT.  By echo estimated systolic PA pressure over 60 mmHg.  Scheduled for right heart catheterization with Dr. Gala Romney on September 6.  Possible candidate for inhaled treprostinil. 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 Isaiah Serge.  On chronic treatment with pirfenidone. Paroxysmal atrial fibrillation: With previous episodes of tachycardia cardiomyopathy.  Currently in sinus rhythm on dofetilide.  QTc in  desirable range.  On appropriate chronic anticoagulation. CHF: There are some age-related changes and diastolic function noted on the echocardiogram, but I do not think this is a major component of her pulmonary hypertension.     Informed Consent   Shared Decision Making/Informed Consent The risks [stroke (1 in 1000), death (1 in 1000), kidney failure [usually temporary] (1 in 500), bleeding (1 in 200), allergic reaction [possibly serious] (1 in 200)], benefits (diagnostic support and management of coronary artery disease) and alternatives of a cardiac catheterization were discussed in detail with Ms. Moffa and she is willing to proceed.        Time:   Today, I have spent 23 minutes with the patient with telehealth technology discussing the above problems.     Medication Adjustments/Labs and Tests Ordered: Current medicines are reviewed at length with the patient today.  Concerns regarding medicines are outlined above.   Tests Ordered: No orders of the defined types were placed in this encounter.   Medication Changes: No orders of the defined types were placed in this encounter.     Signed, Thurmon Fair, MD  06/02/2023 11:37  AM    [facility]

## 2023-06-02 NOTE — Progress Notes (Signed)
Called the patient and went over the AVS

## 2023-06-02 NOTE — Telephone Encounter (Signed)
Called to get this patient checked in for MyChart Visit. Information will be in MyChart Visit

## 2023-06-06 ENCOUNTER — Inpatient Hospital Stay: Payer: Medicare Other | Admitting: Family

## 2023-06-06 ENCOUNTER — Inpatient Hospital Stay: Payer: Medicare Other | Attending: Family

## 2023-06-06 ENCOUNTER — Encounter: Payer: Self-pay | Admitting: Family

## 2023-06-06 VITALS — BP 113/54 | HR 62 | Temp 97.9°F | Resp 19 | Wt 209.1 lb

## 2023-06-06 DIAGNOSIS — D500 Iron deficiency anemia secondary to blood loss (chronic): Secondary | ICD-10-CM | POA: Insufficient documentation

## 2023-06-06 DIAGNOSIS — K922 Gastrointestinal hemorrhage, unspecified: Secondary | ICD-10-CM | POA: Diagnosis not present

## 2023-06-06 LAB — CBC WITH DIFFERENTIAL (CANCER CENTER ONLY)
Abs Immature Granulocytes: 0.01 10*3/uL (ref 0.00–0.07)
Basophils Absolute: 0 10*3/uL (ref 0.0–0.1)
Basophils Relative: 1 %
Eosinophils Absolute: 0.1 10*3/uL (ref 0.0–0.5)
Eosinophils Relative: 1 %
HCT: 35 % — ABNORMAL LOW (ref 36.0–46.0)
Hemoglobin: 11.3 g/dL — ABNORMAL LOW (ref 12.0–15.0)
Immature Granulocytes: 0 %
Lymphocytes Relative: 28 %
Lymphs Abs: 1.5 10*3/uL (ref 0.7–4.0)
MCH: 32.1 pg (ref 26.0–34.0)
MCHC: 32.3 g/dL (ref 30.0–36.0)
MCV: 99.4 fL (ref 80.0–100.0)
Monocytes Absolute: 0.4 10*3/uL (ref 0.1–1.0)
Monocytes Relative: 7 %
Neutro Abs: 3.4 10*3/uL (ref 1.7–7.7)
Neutrophils Relative %: 63 %
Platelet Count: 200 10*3/uL (ref 150–400)
RBC: 3.52 MIL/uL — ABNORMAL LOW (ref 3.87–5.11)
RDW: 13.3 % (ref 11.5–15.5)
WBC Count: 5.3 10*3/uL (ref 4.0–10.5)
nRBC: 0 % (ref 0.0–0.2)

## 2023-06-06 LAB — RETICULOCYTES
Immature Retic Fract: 18.5 % — ABNORMAL HIGH (ref 2.3–15.9)
RBC.: 3.54 MIL/uL — ABNORMAL LOW (ref 3.87–5.11)
Retic Count, Absolute: 93.5 10*3/uL (ref 19.0–186.0)
Retic Ct Pct: 2.6 % (ref 0.4–3.1)

## 2023-06-06 LAB — FERRITIN: Ferritin: 42 ng/mL (ref 11–307)

## 2023-06-06 NOTE — Progress Notes (Signed)
Hematology and Oncology Follow Up Visit  Carrie Haynes [id number] [birth year] 82 y.o. 06/12/2023   Principle Diagnosis:  Iron deficiency anemia secondary to GI blood loss   Current Therapy:        IV iron as indicated     Interim History:  Ms. Carrie Haynes is here today with her husband for follow-up. She is doing fairly well. She is scheduled for a right heart cath on 06/23/2023. She has been diagnosed with pulmonary artery hypertension with the possibility of starting her on treatment with inhaled Treprostinil post cath.  Her SOB is stable at this time on 2 L [place] supplemental O2. She will sometimes increase to 3 if needed and will occasional take a break from if sitting still.  No fever, chills, n/v, cough, rash, dizziness, chest pain, palpitations, abdominal pain or changes in bowel or bladder habits at this time.  No blood loss noted. No bruising or petechiae.  No falls or syncope reported.  Appetite and hydration have been good. Weight was 209 lbs.   ECOG Performance Status: 2 - Symptomatic, <50% confined to bed  Medications:  Allergies as of 06/06/2023       Reactions   Calcium Channel Blockers Other (See Comments)   Acute hypotension, patient became brady/asystole < 5 seconds   Estrogens Conjugated Other (See Comments)   Other reaction(s): DVT   Nystatin Hives        Medication List        Accurate as of June 06, 2023 11:59 PM. If you have any questions, ask your nurse or doctor.          atorvastatin 20 MG tablet Commonly known as: LIPITOR Take 20 mg by mouth daily.   dofetilide 500 MCG capsule Commonly known as: TIKOSYN TAKE 1 CAPSULE BY MOUTH 2 TIMES DAILY.   FreeStyle Precision Neo Test test strip Generic drug: glucose blood USE AS DIRECTED TO TEST BLOOD SUGAR DAILY E11.21   losartan 50 MG tablet Commonly known as: COZAAR Take 50 mg by mouth daily.   Lumigan 0.01 % Soln Generic drug: bimatoprost 1 drop at bedtime.   Magnesium Oxide -Mg Supplement  500 MG Tabs Take 500 mg by mouth daily in the afternoon.   metFORMIN 500 MG 24 hr tablet Commonly known as: GLUCOPHAGE-XR Take 500 mg by mouth at bedtime.   metoprolol succinate 50 MG 24 hr tablet Commonly known as: TOPROL-XL TAKE 1 TABLET BY MOUTH EVERY DAY   Pirfenidone 801 MG Tabs Commonly known as: Esbriet Take 1 tablet (801 mg total) by mouth 3 (three) times daily with meals.   prednisoLONE Acet-Moxifloxacin 1-0.5 % Susp Place 1 drop into the right eye 4 (four) times daily.   Vitamin D-3 125 MCG (5000 UT) Tabs Take 5,000 Units by mouth daily.   Xarelto 20 MG Tabs tablet Generic drug: rivaroxaban TAKE 1 TABLET BY MOUTH DAILY WITH SUPPER        Allergies:  Allergies  Allergen Reactions   Calcium Channel Blockers Other (See Comments)    Acute hypotension, patient became brady/asystole < 5 seconds   Estrogens Conjugated Other (See Comments)    Other reaction(s): DVT   Nystatin Hives    Past Medical History, Surgical history, Social history, and Family History were reviewed and updated.  Review of Systems: All other 10 point review of systems is negative.   Physical Exam:  weight is 209 lb 1.9 oz (94.9 kg). Her oral temperature is 97.9 F (36.6 C). Her blood pressure is 113/54 (  abnormal) and her pulse is 62. Her respiration is 19 and oxygen saturation is 92%.   Wt Readings from Last 3 Encounters:  06/06/23 209 lb 1.9 oz (94.9 kg)  06/02/23 209 lb 8 oz (95 kg)  05/10/23 221 lb (100.2 kg)    Ocular: Sclerae unicteric, pupils equal, round and reactive to light Ear-nose-throat: Oropharynx clear, dentition fair Lymphatic: No cervical or supraclavicular adenopathy Lungs no rales or rhonchi, good excursion bilaterally Heart regular rate and rhythm, no murmur appreciated Abd soft, nontender, positive bowel sounds MSK no focal spinal tenderness, no joint edema Neuro: non-focal, well-oriented, appropriate affect Breasts: Deferred   Lab Results  Component Value  Date   WBC 5.3 06/06/2023   HGB 11.3 (L) 06/06/2023   HCT 35.0 (L) 06/06/2023   MCV 99.4 06/06/2023   PLT 200 06/06/2023   Lab Results  Component Value Date   FERRITIN 42 06/06/2023   IRON 85 06/06/2023   TIBC 332 06/06/2023   UIBC 247 06/06/2023   IRONPCTSAT 26 06/06/2023   Lab Results  Component Value Date   RETICCTPCT 2.6 06/06/2023   RBC 3.52 (L) 06/06/2023   RBC 3.54 (L) 06/06/2023   No results found for: "KPAFRELGTCHN", "LAMBDASER", "KAPLAMBRATIO" No results found for: "IGGSERUM", "IGA", "IGMSERUM" No results found for: "TOTALPROTELP", "ALBUMINELP", "A1GS", "A2GS", "BETS", "BETA2SER", "GAMS", "MSPIKE", "SPEI"   Chemistry      Component Value Date/Time   NA 139 04/27/2023 1648   K 4.8 04/27/2023 1648   CL 101 04/27/2023 1648   CO2 24 04/27/2023 1648   BUN 24 04/27/2023 1648   CREATININE 1.02 (H) 04/27/2023 1648   CREATININE 0.90 08/06/2021 1642      Component Value Date/Time   CALCIUM 9.1 04/27/2023 1648   ALKPHOS 69 04/27/2023 1648   AST 16 04/27/2023 1648   AST 15 02/09/2021 1016   ALT 12 04/27/2023 1648   ALT 10 02/09/2021 1016   BILITOT 0.4 04/27/2023 1648   BILITOT 0.7 02/09/2021 1016       Impression and Plan: Ms. Carrie Haynes is a very pleasant 82 yo caucasian female with anemia secondary to GI blood loss while on anticoagulation.  Iron studies remain stable. No infusion needed at this time.  Follow-up in 6 months.   Eileen Stanford, NP 8/26/20249:21 AM

## 2023-06-07 LAB — IRON AND IRON BINDING CAPACITY (CC-WL,HP ONLY)
Iron: 85 ug/dL (ref 28–170)
Saturation Ratios: 26 % (ref 10.4–31.8)
TIBC: 332 ug/dL (ref 250–450)
UIBC: 247 ug/dL (ref 148–442)

## 2023-06-09 ENCOUNTER — Ambulatory Visit: Payer: Medicare Other | Admitting: Family

## 2023-06-09 ENCOUNTER — Inpatient Hospital Stay: Payer: Medicare Other

## 2023-06-12 ENCOUNTER — Encounter: Payer: Self-pay | Admitting: Family

## 2023-06-13 DIAGNOSIS — H5703 Miosis: Secondary | ICD-10-CM | POA: Diagnosis not present

## 2023-06-13 DIAGNOSIS — H2511 Age-related nuclear cataract, right eye: Secondary | ICD-10-CM | POA: Diagnosis not present

## 2023-06-13 DIAGNOSIS — H2189 Other specified disorders of iris and ciliary body: Secondary | ICD-10-CM | POA: Diagnosis not present

## 2023-06-15 DIAGNOSIS — I5043 Acute on chronic combined systolic (congestive) and diastolic (congestive) heart failure: Secondary | ICD-10-CM | POA: Diagnosis not present

## 2023-06-15 DIAGNOSIS — J84112 Idiopathic pulmonary fibrosis: Secondary | ICD-10-CM | POA: Diagnosis not present

## 2023-06-20 ENCOUNTER — Telehealth: Payer: Self-pay | Admitting: Emergency Medicine

## 2023-06-20 NOTE — Telephone Encounter (Signed)
Staff Messages between Katina Dung, RN, Thurmon Fair, MD CC: Orpah Cobb, RN  ----- Message -----  From: Jacqlyn Krauss, RN  Sent: 06/20/2023   1:35 PM EDT  To: Thurmon Fair, MD; Scheryl Marten, RN; *  Subject: Right Heart Cath 06/23/23                        Hi Dr Royann Shivers,   Xarelto is on her medication list, but I do not see any instructions for her to hold before Right Heart Cath 06/23/23.   Do you want her to hold Xarelto before Right Heart Cath? If so, how long?   Thanks,  Programmer, applications   ----- Message -----  From: Scheryl Marten, RN  Sent: 06/20/2023   1:55 PM EDT  To: Thurmon Fair, MD; Jacqlyn Krauss, RN  Subject: RE: Right Heart Cath 06/23/23                    Pretty sure that he told me that the patient did not need to hold the Xarelto since it is a Right Heart Cath only.   Jacqlyn Krauss, RN  Croitoru, Rachelle Hora, MD Cc: Scheryl Marten, RN; Jacqlyn Krauss, RN Dr Royann Shivers,  If you do not recommend holding Xarelto for Right Heart Cath, would you please document that?  Thanks, Thurston Hole

## 2023-06-20 NOTE — Telephone Encounter (Signed)
RE: Right Heart Cath 06/23/23 Received: Today Croitoru, Rachelle Hora, MD  Scheryl Marten, RN OK to continue the [employer] to Katina Dung, RN that Dr Royann Shivers says that it is ok to continue taking Xarelto.

## 2023-06-21 ENCOUNTER — Other Ambulatory Visit (HOSPITAL_COMMUNITY): Payer: Self-pay

## 2023-06-22 ENCOUNTER — Telehealth: Payer: Self-pay | Admitting: *Deleted

## 2023-06-22 NOTE — Telephone Encounter (Addendum)
Right Heart Cath scheduled at [facility] for: Friday June 23, 2023 9 AM Arrival time [facility] Main Entrance A at: 6:30 AM-needs BMP  Nothing to eat after midnight prior to procedure, clear liquids until 5 AM day of procedure.  Medication instructions: -Patient reports she does not usually take any medications in the mornings, including metformin. -Usual morning medications can be taken with sips of water  Per Dr Lowella Dandy to continue Xarelto-see 06/20/23 Phone Note  Plan to go home the same day, you will only stay overnight if medically necessary.  You must have responsible adult to drive you home.  Someone must be with you the first 24 hours after you arrive home.  Reviewed procedure instructions with patient.

## 2023-06-22 NOTE — Telephone Encounter (Addendum)
RE: Right Heart Cath 06/23/23 Croitoru, Rachelle Hora, MD  Scheryl Marten, RN OK to continue the Xarelto        See 06/20/23 Phone Note.

## 2023-06-23 ENCOUNTER — Encounter (HOSPITAL_COMMUNITY)
Admission: RE | Disposition: A | Discharge status: Home / Self Care | Payer: Self-pay | Source: Home / Self Care | Attending: Internal Medicine

## 2023-06-23 ENCOUNTER — Other Ambulatory Visit: Payer: Self-pay | Admitting: Cardiovascular Disease

## 2023-06-23 ENCOUNTER — Ambulatory Visit (HOSPITAL_COMMUNITY)
Admission: RE | Admit: 2023-06-23 | Discharge: 2023-06-23 | Disposition: A | Discharge status: Home / Self Care | Payer: Medicare Other | Attending: Internal Medicine | Admitting: Internal Medicine

## 2023-06-23 DIAGNOSIS — I4819 Other persistent atrial fibrillation: Secondary | ICD-10-CM

## 2023-06-23 DIAGNOSIS — J9611 Chronic respiratory failure with hypoxia: Secondary | ICD-10-CM | POA: Insufficient documentation

## 2023-06-23 DIAGNOSIS — I509 Heart failure, unspecified: Secondary | ICD-10-CM | POA: Insufficient documentation

## 2023-06-23 DIAGNOSIS — E119 Type 2 diabetes mellitus without complications: Secondary | ICD-10-CM | POA: Insufficient documentation

## 2023-06-23 DIAGNOSIS — Z9981 Dependence on supplemental oxygen: Secondary | ICD-10-CM | POA: Insufficient documentation

## 2023-06-23 DIAGNOSIS — R000 Tachycardia, unspecified: Secondary | ICD-10-CM | POA: Insufficient documentation

## 2023-06-23 DIAGNOSIS — D509 Iron deficiency anemia, unspecified: Secondary | ICD-10-CM | POA: Insufficient documentation

## 2023-06-23 DIAGNOSIS — E785 Hyperlipidemia, unspecified: Secondary | ICD-10-CM | POA: Diagnosis not present

## 2023-06-23 DIAGNOSIS — I110 Hypertensive heart disease with heart failure: Secondary | ICD-10-CM | POA: Diagnosis not present

## 2023-06-23 DIAGNOSIS — I272 Pulmonary hypertension, unspecified: Secondary | ICD-10-CM | POA: Diagnosis not present

## 2023-06-23 DIAGNOSIS — Z01812 Encounter for preprocedural laboratory examination: Secondary | ICD-10-CM

## 2023-06-23 DIAGNOSIS — I081 Rheumatic disorders of both mitral and tricuspid valves: Secondary | ICD-10-CM | POA: Diagnosis not present

## 2023-06-23 DIAGNOSIS — Z79899 Other long term (current) drug therapy: Secondary | ICD-10-CM | POA: Diagnosis not present

## 2023-06-23 HISTORY — PX: RIGHT HEART CATH: CATH118263

## 2023-06-23 LAB — POCT I-STAT, CHEM 8
BUN: 20 mg/dL (ref 8–23)
Calcium, Ion: 1.14 mmol/L — ABNORMAL LOW (ref 1.15–1.40)
Chloride: 101 mmol/L (ref 98–111)
Creatinine, Ser: 1 mg/dL (ref 0.44–1.00)
Glucose, Bld: 128 mg/dL — ABNORMAL HIGH (ref 70–99)
HCT: 35 % — ABNORMAL LOW (ref 36.0–46.0)
Hemoglobin: 11.9 g/dL — ABNORMAL LOW (ref 12.0–15.0)
Potassium: 4.8 mmol/L (ref 3.5–5.1)
Sodium: 135 mmol/L (ref 135–145)
TCO2: 25 mmol/L (ref 22–32)

## 2023-06-23 LAB — POCT I-STAT EG7
Acid-Base Excess: 0 mmol/L (ref 0.0–2.0)
Acid-Base Excess: 1 mmol/L (ref 0.0–2.0)
Bicarbonate: 25.4 mmol/L (ref 20.0–28.0)
Bicarbonate: 26.1 mmol/L (ref 20.0–28.0)
Calcium, Ion: 1.14 mmol/L — ABNORMAL LOW (ref 1.15–1.40)
Calcium, Ion: 1.17 mmol/L (ref 1.15–1.40)
HCT: 33 % — ABNORMAL LOW (ref 36.0–46.0)
HCT: 34 % — ABNORMAL LOW (ref 36.0–46.0)
Hemoglobin: 11.2 g/dL — ABNORMAL LOW (ref 12.0–15.0)
Hemoglobin: 11.6 g/dL — ABNORMAL LOW (ref 12.0–15.0)
O2 Saturation: 58 %
O2 Saturation: 60 %
Potassium: 4.6 mmol/L (ref 3.5–5.1)
Potassium: 4.8 mmol/L (ref 3.5–5.1)
Sodium: 136 mmol/L (ref 135–145)
Sodium: 136 mmol/L (ref 135–145)
TCO2: 27 mmol/L (ref 22–32)
TCO2: 27 mmol/L (ref 22–32)
pCO2, Ven: 43.7 mmHg — ABNORMAL LOW (ref 44–60)
pCO2, Ven: 44.2 mmHg (ref 44–60)
pH, Ven: 7.373 (ref 7.25–7.43)
pH, Ven: 7.38 (ref 7.25–7.43)
pO2, Ven: 31 mmHg — CL (ref 32–45)
pO2, Ven: 32 mmHg (ref 32–45)

## 2023-06-23 SURGERY — RIGHT HEART CATH
Anesthesia: LOCAL

## 2023-06-23 MED ORDER — LIDOCAINE HCL (PF) 1 % IJ SOLN
INTRAMUSCULAR | Status: DC | PRN
  Administered 2023-06-23: 2 mL

## 2023-06-23 MED ORDER — HEPARIN (PORCINE) IN NACL 1000-0.9 UT/500ML-% IV SOLN
INTRAVENOUS | Status: DC | PRN
  Administered 2023-06-23 (×2): 500 mL

## 2023-06-23 MED ORDER — ASPIRIN 81 MG PO CHEW
81.0000 mg | CHEWABLE_TABLET | ORAL | Status: AC
  Administered 2023-06-23: 81 mg via ORAL
  Filled 2023-06-23: qty 1

## 2023-06-23 MED ORDER — LIDOCAINE HCL (PF) 1 % IJ SOLN
INTRAMUSCULAR | Status: AC
  Filled 2023-06-23: qty 30

## 2023-06-23 MED ORDER — SODIUM CHLORIDE 0.9 % IV SOLN: INTRAVENOUS | Status: DC

## 2023-06-23 SURGICAL SUPPLY — 5 items
CATH BALLN WEDGE 5F 110CM (CATHETERS) IMPLANT
PACK CARDIAC CATHETERIZATION (CUSTOM PROCEDURE TRAY) ×1 IMPLANT
SHEATH GLIDE SLENDER 4/5FR (SHEATH) IMPLANT
TRANSDUCER W/STOPCOCK (MISCELLANEOUS) IMPLANT
TUBING ART PRESS 72 MALE/FEM (TUBING) IMPLANT

## 2023-06-23 NOTE — Discharge Instructions (Signed)

## 2023-06-23 NOTE — Telephone Encounter (Signed)
Prescription refill request for Xarelto received.  Indication: Afib  Last office visit: 06/02/23 (Croitoru)  Weight: 94.3kg Age: 82 Scr: 1.02 (04/27/23)  CrCl: 66.62ml/min  Appropriate dose. Refill sent.

## 2023-06-23 NOTE — Interval H&P Note (Signed)
History and Physical Interval Note:  06/23/2023 8:11 AM  Carrie Haynes  has presented today for surgery, with the diagnosis of hp.  The various methods of treatment have been discussed with the patient and family. After consideration of risks, benefits and other options for treatment, the patient has consented to  Procedure(s): RIGHT HEART CATH (N/A) as a surgical intervention.  The patient's history has been reviewed, patient examined, no change in status, stable for surgery.  I have reviewed the patient's chart and labs.  Questions were answered to the patient's satisfaction.     Murdock Jellison

## 2023-06-26 ENCOUNTER — Encounter (HOSPITAL_COMMUNITY): Payer: Self-pay | Admitting: Internal Medicine

## 2023-06-26 DIAGNOSIS — Z0000 Encounter for general adult medical examination without abnormal findings: Secondary | ICD-10-CM | POA: Diagnosis not present

## 2023-06-26 DIAGNOSIS — E785 Hyperlipidemia, unspecified: Secondary | ICD-10-CM | POA: Diagnosis not present

## 2023-06-26 DIAGNOSIS — E1169 Type 2 diabetes mellitus with other specified complication: Secondary | ICD-10-CM | POA: Diagnosis not present

## 2023-06-26 DIAGNOSIS — I129 Hypertensive chronic kidney disease with stage 1 through stage 4 chronic kidney disease, or unspecified chronic kidney disease: Secondary | ICD-10-CM | POA: Diagnosis not present

## 2023-06-26 DIAGNOSIS — Z23 Encounter for immunization: Secondary | ICD-10-CM | POA: Diagnosis not present

## 2023-06-26 DIAGNOSIS — N183 Chronic kidney disease, stage 3 unspecified: Secondary | ICD-10-CM | POA: Diagnosis not present

## 2023-07-16 DIAGNOSIS — J84112 Idiopathic pulmonary fibrosis: Secondary | ICD-10-CM | POA: Diagnosis not present

## 2023-07-16 DIAGNOSIS — I5043 Acute on chronic combined systolic (congestive) and diastolic (congestive) heart failure: Secondary | ICD-10-CM | POA: Diagnosis not present

## 2023-07-19 ENCOUNTER — Other Ambulatory Visit: Payer: Self-pay

## 2023-07-19 NOTE — Progress Notes (Signed)
Specialty Pharmacy Refill Coordination Note  Carrie Haynes is a 82 y.o. female contacted today regarding refills of specialty medication(s) Pirfenidone   Patient requested Delivery   Delivery date: 07/25/23   Verified address: [address]  [place] [address]   Medication will be filled on 07/24/23.

## 2023-07-21 ENCOUNTER — Ambulatory Visit: Payer: Medicare Other | Admitting: Cardiovascular Disease

## 2023-07-21 ENCOUNTER — Encounter: Payer: Self-pay | Admitting: Cardiovascular Disease

## 2023-07-21 ENCOUNTER — Ambulatory Visit: Payer: Medicare Other | Attending: Cardiovascular Disease | Admitting: Cardiovascular Disease

## 2023-07-21 VITALS — BP 130/64 | HR 60 | Ht 60.0 in | Wt 207.8 lb

## 2023-07-21 DIAGNOSIS — Z79899 Other long term (current) drug therapy: Secondary | ICD-10-CM

## 2023-07-21 DIAGNOSIS — I10 Essential (primary) hypertension: Secondary | ICD-10-CM

## 2023-07-21 DIAGNOSIS — I2721 Secondary pulmonary arterial hypertension: Secondary | ICD-10-CM

## 2023-07-21 DIAGNOSIS — J84112 Idiopathic pulmonary fibrosis: Secondary | ICD-10-CM

## 2023-07-21 DIAGNOSIS — J9611 Chronic respiratory failure with hypoxia: Secondary | ICD-10-CM

## 2023-07-21 DIAGNOSIS — I2781 Cor pulmonale (chronic): Secondary | ICD-10-CM

## 2023-07-21 DIAGNOSIS — I480 Paroxysmal atrial fibrillation: Secondary | ICD-10-CM | POA: Diagnosis not present

## 2023-07-21 DIAGNOSIS — I5043 Acute on chronic combined systolic (congestive) and diastolic (congestive) heart failure: Secondary | ICD-10-CM

## 2023-07-21 DIAGNOSIS — D6869 Other thrombophilia: Secondary | ICD-10-CM

## 2023-07-21 DIAGNOSIS — Z5181 Encounter for therapeutic drug level monitoring: Secondary | ICD-10-CM

## 2023-07-21 NOTE — Patient Instructions (Signed)
Medication Instructions:  No changes *If you need a refill on your cardiac medications before your next appointment, please call your pharmacy*   Follow-Up: At [facility], you and your health needs are our priority.  As part of our continuing mission to provide you with exceptional heart care, we have created designated Provider Care Teams.  These Care Teams include your primary Cardiologist (physician) and Advanced Practice Providers (APPs -  Physician Assistants and Nurse Practitioners) who all work together to provide you with the care you need, when you need it.  We recommend signing up for the patient portal called "MyChart".  Sign up information is provided on this After Visit Summary.  MyChart is used to connect with patients for Virtual Visits (Telemedicine).  Patients are able to view lab/test results, encounter notes, upcoming appointments, etc.  Non-urgent messages can be sent to your provider as well.   To learn more about what you can do with MyChart, go to [email or web address].    Your next appointment:   6 month(s)  Provider:   Mihai Croitoru, MD     

## 2023-07-21 NOTE — Progress Notes (Signed)
Cardiology Office Note:    Date:  07/21/2023   ID:  KENSLI KOREY, DOB [birth year], MRN [id number]  PCP:  Laurann Montana, MD  Cardiologist:  Thurmon Fair, MD    Referring MD: Laurann Montana, MD   Chief Complaint  Patient presents with   Atrial Fibrillation      History of Present Illness:    Carrie Haynes is a 82 y.o. 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  She returns after right heart catheterization for pulmonary hypertension.  This showed moderate to severe pulmonary artery hypertension (approximately 70/10 mmHg despite normal pulmonary wedge pressure of only 10 mmHg; preserved cardiac index of 2.2).  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  She feels okay at rest but if she tries to walk she often develops epigastric fullness and right upper quadrant discomfort.  She has not had any lower extremity edema.  She does not have orthopnea or PND, but often feels that her breathing is better if she lies on her left side, rather than on the right side.  She typically uses oxygen 2 L/min at rest and increases it to 3 to 4 L/min while she is walking.  She has not had exertional chest pain, palpitations, syncope.  She denies neurological complaints, falls, bleeding problems.  She has been maintaining sinus rhythm.  QTc today is in acceptable range at 444 ms (on dofetilide).    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  Previous pulmonary function tests have shown mild-moderate reduction in FVC (65-75% of predicted, DLCO corrects for alveolar volume).  She did receive amiodarone for a  couple of months in 2018.    Initially diagnosed with atrial fibrillation with rapid ventricular response in July 2018 when she presented with tachycardia cardiomyopathy.  Her echo showed mildly depressed LVEF at 45% with diffuse hypokinesis and moderate mitral regurgitation, mildly dilated left atrium, but also a moderately dilated right atrium with moderate to severe tricuspid regurgitation and mild pulmonary hypertension.  Coronary angiography showed no evidence of significant CAD.  Rate control was difficult.  She underwent cardioversion on August 2 unsuccessfully, but eventually after loading with IV amiodarone was successfully cardioverted on August 7. Pulmonary function tests (before treatment with amiodarone) raised concern for restrictive lung disease, thereby making long-term treatment with amiodarone undesirable.  Amiodarone was stopped in November 2018. Follow-up echocardiogram shows complete normalization of left ventricular systolic function, EF 55-60%, with some residual signs of impaired relaxation.  A CT of the chest performed in July 2020 did show evidence of mild-moderate pulmonary fibrosis, but this was unchanged from the previous CT in 2018.    In November 2020 she presented with recurrent shortness of breath and was found to again have atrial fibrillation.  She was in rapid ventricular response but was unaware of the arrhythmia.  Echocardiography shows her EF has again dropped to 45-50%.  She was hospitalized for dofetilide loading, converted to sinus rhythm without electrical cardioversion during antiarrhythmic administration.     Allergies:   Calcium channel blockers, Estrogens conjugated, and Nystatin     Recent Labs: 04/27/2023: ALT 12; NT-Pro BNP 858 06/06/2023: Platelet Count 200 06/23/2023: BUN 20; Creatinine, Ser 1.00; Hemoglobin 11.6; Hemoglobin  11.2; Potassium 4.8; Potassium 4.6; Sodium 136; Sodium 136      12/12/2022 Cholesterol 181, HDL 65, LDL 99, triglycerides 97,  hemoglobin A1c 7.0% 06/26/2023 hemoglobin A1c 6.8%, hemoglobin 11.6, creatinine 1.0, potassium 4.8  Studies Reviewed: Marland Kitchen   EKG Interpretation Date/Time:  Friday July 21 2023 08:31:34 EDT Ventricular Rate:  66 PR Interval:  178 QRS Duration:  82 QT Interval:  424 QTC Calculation: 444 R Axis:   -52  Text Interpretation: Normal sinus rhythm with sinus arrhythmia Low voltage QRS Left anterior fascicular block Cannot rule out Anterior infarct , age undetermined When compared with ECG of 23-Jun-2023 07:49, No significant change was found Confirmed by Quantavius Humm [id number]) on 07/21/2023 8:39:08 AM     Risk Assessment/Calculations:    CHA2DS2-VASc Score = 6   This indicates a 9.7% annual risk of stroke. The patient's score is based upon: CHF History: 1 HTN History: 1 Diabetes History: 1 Stroke History: 0 Vascular Disease History: 0 Age Score: 2 Gender Score: 1     Physical Exam:    VS:  BP 130/64 (BP Location: Left Arm, Patient Position: Sitting, Cuff Size: Large)   Pulse 60   Ht 5' (1.524 m)   Wt 207 lb 12.8 oz (94.3 kg)   SpO2 (!) 89%   BMI 40.58 kg/m     Wt Readings from Last 3 Encounters:  07/21/23 207 lb 12.8 oz (94.3 kg)  06/23/23 208 lb (94.3 kg)  06/06/23 209 lb 1.9 oz (94.9 kg)    General: Alert, oriented x3, no distress, morbidly obese.  Appears comfortable while wearing oxygen. Head: no evidence of trauma, PERRL, EOMI, no exophtalmos or lid lag, no myxedema, no xanthelasma; normal ears, nose and oropharynx Neck: normal jugular venous pulsations and no hepatojugular reflux; brisk carotid pulses without delay and no carotid bruits Chest: Bilateral Velcro-like dry crackles in both lung bases, do not clear with cough Cardiovascular: normal position and quality of the apical impulse, regular rhythm, normal first and second heart sounds, no murmurs, rubs or gallops Abdomen: no tenderness or distention, no masses by palpation, no abnormal pulsatility or arterial  bruits, normal bowel sounds, no hepatosplenomegaly Extremities: no clubbing, cyanosis or edema; 2+ radial, ulnar and brachial pulses bilaterally; 2+ right femoral, posterior tibial and dorsalis pedis pulses; 2+ left femoral, posterior tibial and dorsalis pedis pulses; no subclavian or femoral bruits Neurological: grossly nonfocal Psych: Normal mood and affect   ASSESSMENT:    1. Paroxysmal atrial fibrillation (HCC)   2. PAH (pulmonary artery hypertension) (HCC)   3. Cor pulmonale, chronic (HCC)   4. Chronic respiratory failure with hypoxia (HCC)   5. IPF (idiopathic pulmonary fibrosis) (HCC)   6. Essential hypertension   7. Morbid obesity due to excess calories (HCC)   8. Acute on chronic combined systolic and diastolic CHF (congestive heart failure) (HCC)   9. Encounter for monitoring dofetilide therapy   10. Acquired thrombophilia (HCC)      PLAN:    In order of problems listed above:  AFib: Excellent arrhythmia suppression on dofetilide.  She has biatrial dilation.  Continue anticoagulation.  No history of stroke/TIA, but CHA2DS2-VASc 5 (age 22, gender, hypertension, history of CHF).   CHF: She has had previous problems with tachycardia cardiomyopathy, but appears to have had complete resolution of left ventricular dysfunction.  Clinically euvolemic.  Recent heart catheterization showed excellent left heart filling pressures.  Functional status is impaired by pulmonary problems.   PAH: Entirely due to precapillary obstruction.  Left heart  failure is not a contributing factor. Cor pulmonale: Currently without any signs of right heart failure/hypervolemia.  Multifactorial (obesity, restrictive lung disease, maybe OSA), she does not have any signs of right heart failure on exam today.  She does not have daytime hypersomnolence.   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  Scheduled for repeat PFTs this year which have not yet been performed.   Remains on treatment with pirfenidone.  Clearly some part of her restriction is due to her obesity, but high-resolution CT also showed marked progression of widespread areas of groundglass attenuation and evidence of fibrosis, without frank honeycombing.  On chronic O2 at 2-3 L/min. HTN: well-controlled on current medications. Obesity: Morbid obesity is part of her restrictive lung disease, but interstitial lung disease appears to be the dominant problem. DM: good control with hemoglobin A1c 6.8% in February. Dofetilide monitoring: QTC in normal range.  Recent potassium and renal function were normal.  Reviewed the importance of being aware of potential drug interactions with this medication. Anticoagulation: No overt bleeding on Xarelto anticoagulation.  Minimal reduction in hemoglobin level, normocytic normochromic with normal iron stores.           Dispo:  f/u 6 months  Signed, Thurmon Fair, MD

## 2023-08-07 ENCOUNTER — Ambulatory Visit: Payer: Medicare Other | Admitting: Pulmonary Disease

## 2023-08-16 ENCOUNTER — Other Ambulatory Visit: Payer: Self-pay

## 2023-08-28 ENCOUNTER — Ambulatory Visit: Payer: Medicare Other

## 2023-08-28 DIAGNOSIS — I700 Atherosclerosis of aorta: Secondary | ICD-10-CM | POA: Diagnosis not present

## 2023-08-28 DIAGNOSIS — J849 Interstitial pulmonary disease, unspecified: Secondary | ICD-10-CM | POA: Diagnosis not present

## 2023-08-28 DIAGNOSIS — R918 Other nonspecific abnormal finding of lung field: Secondary | ICD-10-CM | POA: Diagnosis not present

## 2023-08-28 DIAGNOSIS — J8410 Pulmonary fibrosis, unspecified: Secondary | ICD-10-CM | POA: Diagnosis not present

## 2023-08-28 DIAGNOSIS — I288 Other diseases of pulmonary vessels: Secondary | ICD-10-CM

## 2023-08-28 DIAGNOSIS — K8020 Calculus of gallbladder without cholecystitis without obstruction: Secondary | ICD-10-CM | POA: Diagnosis not present

## 2023-08-28 DIAGNOSIS — J84112 Idiopathic pulmonary fibrosis: Secondary | ICD-10-CM

## 2023-08-31 ENCOUNTER — Other Ambulatory Visit (HOSPITAL_COMMUNITY): Payer: Self-pay

## 2023-09-01 ENCOUNTER — Other Ambulatory Visit: Payer: Self-pay

## 2023-09-05 ENCOUNTER — Other Ambulatory Visit (HOSPITAL_COMMUNITY): Payer: Self-pay

## 2023-09-06 ENCOUNTER — Telehealth: Payer: Self-pay | Admitting: Pulmonary Disease

## 2023-09-06 NOTE — Telephone Encounter (Signed)
Elevated PT calling because she has not rec'd results of her CT.I advised that unlike the past, results are coming in much later than the usual 2-3 days due to Radiologist shortage.  Maybe we can call to have results STAT to put her at ease. Main concern seems to be "no call must mean bad news" . Pls call PT @ [phone]

## 2023-09-12 NOTE — Telephone Encounter (Signed)
HR CT done 08/28/23  Spoke with Elnita Maxwell in reading room to have pushed.

## 2023-09-19 ENCOUNTER — Other Ambulatory Visit (HOSPITAL_COMMUNITY): Payer: Self-pay

## 2023-09-21 NOTE — Telephone Encounter (Signed)
I called and reviewed the CT scan with the patient which shoes stable ILD. We will review PFTs when completed in Feb. She will also need assessment for treatment options for new diagnosis of pulmonary HTN. She has cancelled her appointment with Dr. Judeth Horn and prefers to wait for her scheduled visit with me in Feb to review options. Nothing further needed

## 2023-10-03 ENCOUNTER — Other Ambulatory Visit (HOSPITAL_COMMUNITY): Payer: Self-pay

## 2023-10-05 ENCOUNTER — Other Ambulatory Visit (HOSPITAL_COMMUNITY): Payer: Self-pay

## 2023-10-05 ENCOUNTER — Other Ambulatory Visit: Payer: Self-pay

## 2023-10-05 NOTE — Progress Notes (Signed)
Specialty Pharmacy Refill Coordination Note  Carrie Haynes is a 82 y.o. female contacted today regarding refills of specialty medication(s) Pirfenidone   Patient requested Delivery   Delivery date: 10/13/23   Verified address: [address] [place], [place] [address]   Medication will be filled on 10/12/23.

## 2023-10-05 NOTE — Progress Notes (Signed)
Specialty Pharmacy Ongoing Clinical Assessment Note  Carrie Haynes is a 82 y.o. female who is being followed by the specialty pharmacy service for RxSp Interstitial Lung Disease   Patient's specialty medication(s) reviewed today: Pirfenidone   Missed doses in the last 4 weeks: 10 (patient often only takes 2 doses a day skipping her first dose because she doesn't eat breakfast)   Patient/Caregiver did not have any additional questions or concerns.   Therapeutic benefit summary: Patient is achieving benefit   Adverse events/side effects summary: No adverse events/side effects   Patient's therapy is appropriate to: Continue    Goals Addressed             This Visit's Progress    Slow Disease Progression       Patient is on track. Patient will maintain adherence.  PFTs were reported to be stable at 04/27/23 office visit.          Follow up:  6 months  Servando Snare Specialty Pharmacist

## 2023-10-12 ENCOUNTER — Other Ambulatory Visit: Payer: Self-pay

## 2023-10-12 ENCOUNTER — Telehealth: Payer: Self-pay

## 2023-10-12 NOTE — Telephone Encounter (Signed)
Received notification from [facility] MEDICARE  regarding a prior authorization for PIRFENIDONE. Authorization has been APPROVED from 10/12/2023 to 10/11/2024.    Authorization # [id number]

## 2023-10-12 NOTE — Telephone Encounter (Signed)
Received notification from [facility] pharmacy that patient requires a new authorization for their medication.  Submitted an URGENT Prior Authorization request to [facility] MEDICARE  for PIRFENIDONE via CoverMyMeds. Will update once we receive a response.  Key: [id number]

## 2023-10-19 ENCOUNTER — Other Ambulatory Visit: Payer: Self-pay | Admitting: Cardiovascular Disease

## 2023-10-20 ENCOUNTER — Telehealth: Payer: Self-pay | Admitting: Cardiovascular Disease

## 2023-10-20 NOTE — Telephone Encounter (Signed)
*  STAT* If patient is at the pharmacy, call can be transferred to refill team.   1. Which medications need to be refilled? (please list name of each medication and dose if known)   metoprolol  succinate (TOPROL -XL) 50 MG 24 hr tablet    2. Which pharmacy/location (including street and city if local pharmacy) is medication to be sent to?  [id number] - [place], [place] - [address]      3. Do they need a 30 day or 90 day supply? 90 day    Pt is out of medication

## 2023-11-11 ENCOUNTER — Other Ambulatory Visit: Payer: Self-pay | Admitting: Cardiovascular Disease

## 2023-11-13 ENCOUNTER — Ambulatory Visit: Payer: Medicare Other | Admitting: Pulmonary Disease

## 2023-11-14 ENCOUNTER — Other Ambulatory Visit: Payer: Self-pay

## 2023-11-17 ENCOUNTER — Other Ambulatory Visit (HOSPITAL_COMMUNITY): Payer: Self-pay

## 2023-11-20 ENCOUNTER — Other Ambulatory Visit: Payer: Self-pay

## 2023-11-21 ENCOUNTER — Ambulatory Visit: Payer: Medicare Other | Admitting: Pulmonary Disease

## 2023-11-21 ENCOUNTER — Encounter: Payer: Self-pay | Admitting: Pulmonary Disease

## 2023-11-21 VITALS — BP 110/64 | HR 64 | Temp 97.3°F | Ht 60.0 in | Wt 211.0 lb

## 2023-11-21 DIAGNOSIS — J84112 Idiopathic pulmonary fibrosis: Secondary | ICD-10-CM

## 2023-11-21 DIAGNOSIS — I272 Pulmonary hypertension, unspecified: Secondary | ICD-10-CM | POA: Diagnosis not present

## 2023-11-21 DIAGNOSIS — Z5181 Encounter for therapeutic drug level monitoring: Secondary | ICD-10-CM

## 2023-11-21 DIAGNOSIS — R0600 Dyspnea, unspecified: Secondary | ICD-10-CM

## 2023-11-21 LAB — PULMONARY FUNCTION TEST
DL/VA % pred: 59 %
DL/VA: 2.51 ml/min/mmHg/L
DLCO cor % pred: 31 %
DLCO cor: 5.17 ml/min/mmHg
DLCO unc % pred: 31 %
DLCO unc: 5.17 ml/min/mmHg
FEF 25-75 Post: 1.55 L/s
FEF 25-75 Pre: 1.33 L/s
FEF2575-%Change-Post: 16 %
FEF2575-%Pred-Post: 130 %
FEF2575-%Pred-Pre: 112 %
FEV1-%Change-Post: 0 %
FEV1-%Pred-Post: 70 %
FEV1-%Pred-Pre: 71 %
FEV1-Post: 1.11 L
FEV1-Pre: 1.12 L
FEV1FVC-%Change-Post: 0 %
FEV1FVC-%Pred-Pre: 118 %
FEV6-%Change-Post: 0 %
FEV6-%Pred-Post: 63 %
FEV6-%Pred-Pre: 63 %
FEV6-Post: 1.28 L
FEV6-Pre: 1.27 L
FEV6FVC-%Pred-Post: 106 %
FEV6FVC-%Pred-Pre: 106 %
FVC-%Change-Post: 0 %
FVC-%Pred-Post: 59 %
FVC-%Pred-Pre: 59 %
FVC-Post: 1.28 L
FVC-Pre: 1.27 L
Post FEV1/FVC ratio: 87 %
Post FEV6/FVC ratio: 100 %
Pre FEV1/FVC ratio: 88 %
Pre FEV6/FVC Ratio: 100 %
RV % pred: 57 %
RV: 1.25 L
TLC % pred: 56 %
TLC: 2.53 L

## 2023-11-21 NOTE — Progress Notes (Signed)
 Full PFT performed today.

## 2023-11-21 NOTE — Patient Instructions (Signed)
 Full PFT performed today.

## 2023-11-21 NOTE — Progress Notes (Signed)
 Carrie Haynes    [id number]    [birth year]  Primary Care Physician:White, Montie, MD  Referring Physician: Teresa Montie, MD [phone] [address],  [place] [address]  Chief complaint: Follow-up for IPF Started Esbriet  in November 2022  HPI: 83 year old with history of hypertension, hyperlipidemia, atrial fibrillation Referred for evaluation of pulmonary fibrosis  She has CT scans showing progressive probable UIP pattern with worsening dyspnea.  She was started on supplemental oxygen  1 year ago Has chronic cough.  Denies any wheezing, sputum production, fevers, chills  History significant for atrial fibrillation for which she was on Xarelto .  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  No feather pillows or comforter ILD questionnaire 08/06/2021-negative Smoking history: Never smoker Travel history: No significant travel history Relevant family history: No family history of lung disease  Interim history: Discussed the use of AI scribe software for clinical note transcription with the patient, who gave verbal consent to proceed.  Carrie Haynes is a 83 year old female with pulmonary fibrosis and pulmonary hypertension who presents with worsening shortness of breath.  She experiences worsening shortness of breath, particularly during ambulation, necessitating frequent rest. Cold air exacerbates her symptoms, leading her to avoid outdoor activities. She has difficulty lying supine and often needs to sit on the side of the bed to regain composure before lying down. She sometimes breathes better on one side than the other.  She is currently taking Esbriet  two to three times daily. Her CT scan remains stable, but lung function tests show a decline, especially in diffusion capacity. She requires more oxygen  than before, using a concentrator set  at three to three and a half liters at home, compared to two liters previously.  She has a history of pulmonary hypertension, confirmed by heart catheterization last year. She has not yet started treatment for this condition.  She was referred to Dr. Annella but she canceled her appointment and prefer to wait till this visit to discuss treatment options.  Outpatient Encounter Medications as of 11/21/2023  Medication Sig   atorvastatin  (LIPITOR) 20 MG tablet Take 20 mg by mouth every evening.   Cholecalciferol  (VITAMIN D -3) 5000 units TABS Take 5,000 Units by mouth daily.   dofetilide  (TIKOSYN ) 500 MCG capsule TAKE 1 CAPSULE BY MOUTH TWICE A DAY   FREESTYLE PRECISION NEO TEST test strip USE AS DIRECTED TO TEST BLOOD SUGAR DAILY E11.21   losartan  (COZAAR ) 50 MG tablet Take 50 mg by mouth daily.   LUMIGAN 0.01 % SOLN Place 1 drop into both eyes at bedtime.   Magnesium  Oxide 500 MG TABS Take 500 mg by mouth daily in the afternoon.   metFORMIN (GLUCOPHAGE-XR) 500 MG 24 hr tablet Take 500 mg by mouth at bedtime.   metoprolol  succinate (TOPROL -XL) 50 MG 24 hr tablet TAKE 1 TABLET BY MOUTH EVERY DAY   Pirfenidone  (ESBRIET ) 801 MG TABS Take 1 tablet (801 mg total) by mouth 3 (three) times daily with meals. (Patient taking differently: Take 801 mg by mouth 2 (two) times daily.)   prednisoLONE Acet-Moxifloxacin 1-0.5 % SUSP Place 1 drop into the right eye 4 (four) times daily.   XARELTO  20 MG TABS tablet TAKE 1 TABLET BY MOUTH DAILY WITH SUPPER   No facility-administered encounter medications on file as of 11/21/2023.   Physical Exam: Blood pressure 110/64,  pulse 64, temperature (!) 97.3 F (36.3 C), temperature source Temporal, height 5' (1.524 m), weight 211 lb (95.7 kg), SpO2 96%. Gen:      No acute distress HEENT:  EOMI, sclera anicteric Neck:     No masses; no thyromegaly Lungs:    Clear to auscultation bilaterally; normal respiratory effort CV:         Regular rate and rhythm; no murmurs Abd:       + bowel sounds; soft, non-tender; no palpable masses, no distension Ext:    No edema; adequate peripheral perfusion Skin:      Warm and dry; no rash Neuro: alert and oriented x 3 Psych: normal mood and affect   Data Reviewed: Imaging: CT high-resolution 07/21/2021- Worsening of interstitial lung disease and probable UIP pattern, dilatation of pulmonary trunk, moderate cardiomegaly, atherosclerosis and three-vessel coronary disease  CT high-resolution 07/25/2022 Pulmonary pattern of fibrosis which looks unchanged  High resolution CT 08/26/2023-stable pattern of pulmonary fibrosis. I have reviewed the images personally.  PFTs: 10/23/2017 FVC 1.76 [74%], FEV1 1.66 [93%], F/F 94, TLC 2.65 [59%], DLCO 9.34 [49%] Moderate restriction, severe diffusion defect  04/26/2022 FVC 1.28 [57%], FEV1 1.19 [72%], F/F 93, TLC 2.78 [62%], DLCO 9.62 [58%] Moderate restriction and diffusion defect.  Labs: CTD serologies 08/06/2021-negative proBNP 08/06/2021-742 Hepatic panel 09/16/2021-within normal limits  Labs from primary care dated 12/15/2021 CBC-WBC 8, hemoglobin 11.3, platelets 228, eosinophils 0.8%, BMP within normal limits.    Hepatic panel 04/26/2022 - within normal limits  Cardiac: Right heart catheterization 06/23/2023 RA = 4 RV = 71/10 PA = 68/21 (42) PCW = 10 Fick cardiac output/index = 4.2/2.2 PVR = 7.5 WU FA sat = 98% PA sat = 59% PAPi = 11.8 Moderate PAH with normal left sided pressures. 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  No significant CTD symptoms are exposures.  High probability for being IPF CTD serologies are negative  Patient on Esbriet  (pirfenidone ) for IPF, taking it two to three times daily. CT scan shows stable scarring, but lung function tests indicate decreased  diffusion capacity. Symptoms include increased dyspnea, orthopnea, and higher oxygen  requirements. Discussed the need to monitor lung function and adjust oxygen  therapy as needed. - Continue Esbriet  (pirfenidone ) - Order labs to monitor Esbriet  therapy - Increase home oxygen  to 3-3.5 L/min as needed  Pulmonary Hypertension Pulmonary hypertension diagnosed last year via heart catheterization. Considering treatment due to declining lung function [especially diffusion capacity] and increased oxygen  requirements. Discussed Tyvaso  inhaler, which can relieve pulmonary hypertension and potentially slow lung scarring. Common side effects include dizziness, excessive coughing, and flushing. Patient expressed concerns about safety and cost but agreed to proceed pending insurance coverage and patient assistance. Informed that Tyvaso  has been on the market for two years and is considered safe. The inhaler works by vasodilation in the lungs to reduce pressure and improve oxygen  exchange. Patient will need training to use the inhaler.  - Start paperwork for Tyvaso  inhaler - Check insurance coverage and patient assistance for Tyvaso  - Schedule training session for Tyvaso  inhaler use  General Health Maintenance Discussed the importance of managing medications and oxygen  therapy. Encouraged to maintain activity levels within her comfort zone. - Encourage adherence to medication regimen - Maintain activity levels within comfort zone  Follow-up - Schedule follow-up appointment for Tyvaso  inhaler  training - Review lab results once available - Monitor lung function and symptoms regularly.    Plan/Recommendations: Continue Esbriet .  Encouraged to take 3 times a day Start Tyvaso  Check CMP, CBC,BNP  Lonna Coder MD [facility] 11/21/2023, 3:11 PM  CC: Teresa Channel, MD

## 2023-11-21 NOTE — Patient Instructions (Signed)
 VISIT SUMMARY:  Carrie Haynes, an 83 year old female with pulmonary fibrosis and pulmonary hypertension, visited today due to worsening shortness of breath. Her symptoms have increased, especially during physical activity and in cold air. She is currently on Esbriet  for pulmonary fibrosis, and her CT scan remains stable, but lung function tests show a decline. She requires more oxygen  than before. Pulmonary hypertension was confirmed last year, and we discussed starting treatment with a Tyvaso  inhaler.  YOUR PLAN:  -IDIOPATHIC PULMONARY FIBROSIS (IPF): Idiopathic Pulmonary Fibrosis (IPF) is a condition where the lungs become scarred and breathing becomes increasingly difficult. You are currently taking Esbriet  (pirfenidone ) to manage this condition. Your CT scan shows stable scarring, but lung function tests indicate decreased diffusion capacity. We will continue with Esbriet , order labs to monitor its effects, and increase your home oxygen  to 3-3.5 L/min as needed.  -PULMONARY HYPERTENSION: Pulmonary Hypertension is high blood pressure in the lungs' arteries, which can worsen lung function. We discussed starting treatment with a Tyvaso  inhaler, which can help reduce pressure in the lungs and improve oxygen  exchange. We will start the paperwork for the inhaler, check your insurance coverage, and schedule a training session for its use.  -GENERAL HEALTH MAINTENANCE: It's important to manage your medications and oxygen  therapy carefully. Please continue to maintain activity levels within your comfort zone and adhere to your medication regimen.  INSTRUCTIONS:  Please schedule a follow-up appointment for Tyvaso  inhaler training. We will review your lab results once they are available and continue to monitor your lung function and symptoms regularly.

## 2023-11-22 ENCOUNTER — Telehealth: Payer: Self-pay | Admitting: Pharmacist

## 2023-11-22 LAB — COMPREHENSIVE METABOLIC PANEL
ALT: 27 U/L (ref 0–35)
AST: 29 U/L (ref 0–37)
Albumin: 3.8 g/dL (ref 3.5–5.2)
Alkaline Phosphatase: 67 U/L (ref 39–117)
BUN: 19 mg/dL (ref 6–23)
CO2: 27 meq/L (ref 19–32)
Calcium: 9.1 mg/dL (ref 8.4–10.5)
Chloride: 98 meq/L (ref 96–112)
Creatinine, Ser: 0.83 mg/dL (ref 0.40–1.20)
GFR: 66.65 mL/min (ref >60.00)
Glucose, Bld: 118 mg/dL — ABNORMAL HIGH (ref 70–99)
Potassium: 5.1 meq/L (ref 3.5–5.1)
Sodium: 133 meq/L — ABNORMAL LOW (ref 135–145)
Total Bilirubin: 0.6 mg/dL (ref 0.2–1.2)
Total Protein: 7.3 g/dL (ref 6.0–8.3)

## 2023-11-22 LAB — CBC WITH DIFFERENTIAL/PLATELET
Basophils Absolute: 0.1 10*3/uL (ref 0.0–0.1)
Basophils Relative: 0.8 % (ref 0.0–3.0)
Eosinophils Absolute: 0 10*3/uL (ref 0.0–0.7)
Eosinophils Relative: 0.3 % (ref 0.0–5.0)
HCT: 37.4 % (ref 36.0–46.0)
Hemoglobin: 12.2 g/dL (ref 12.0–15.0)
Lymphocytes Relative: 13.4 % (ref 12.0–46.0)
Lymphs Abs: 1 10*3/uL (ref 0.7–4.0)
MCHC: 32.7 g/dL (ref 30.0–36.0)
MCV: 99 fL (ref 78.0–100.0)
Monocytes Absolute: 0.4 10*3/uL (ref 0.1–1.0)
Monocytes Relative: 5.8 % (ref 3.0–12.0)
Neutro Abs: 5.7 10*3/uL (ref 1.4–7.7)
Neutrophils Relative %: 79.7 % — ABNORMAL HIGH (ref 43.0–77.0)
Platelets: 241 10*3/uL (ref 150.0–400.0)
RBC: 3.78 Mil/uL — ABNORMAL LOW (ref 3.87–5.11)
RDW: 14.5 % (ref 11.5–15.5)
WBC: 7.1 10*3/uL (ref 4.0–10.5)

## 2023-11-22 LAB — BRAIN NATRIURETIC PEPTIDE: Pro B Natriuretic peptide (BNP): 323 pg/mL — ABNORMAL HIGH (ref 0.0–100.0)

## 2023-11-22 NOTE — Telephone Encounter (Addendum)
 Received new start paperwork for Tyvaso  DPI  Submitted a Prior Authorization request to [facility] for TYVASO  DPI via CoverMyMeds. Will update once we receive a response.  Key: [id number]   Once PA is approved, will need to fax below  Submitted completed referral paperwork to [employer] for TYVASO  along with HRCT, right heart cath results, and most recent progress note.  Fax# [phone] Phone# [phone]  Sherry Pennant, PharmD, MPH, BCPS, CPP Clinical Pharmacist (Rheumatology and Pulmonology)

## 2023-11-23 NOTE — Telephone Encounter (Signed)
 Received notification from [facility] regarding a prior authorization for TYVASO  DPI. Authorization has been APPROVED from 11/22/2023 to 11/21/2024. Approval letter sent to scan center.  Phone # [phone]  Authorization faxed to [facility] @ fax: Fax# [phone]

## 2023-12-06 ENCOUNTER — Other Ambulatory Visit: Payer: Medicare Other

## 2023-12-06 ENCOUNTER — Ambulatory Visit: Payer: Medicare Other | Admitting: Family

## 2023-12-08 ENCOUNTER — Inpatient Hospital Stay: Payer: Medicare Other

## 2023-12-08 ENCOUNTER — Inpatient Hospital Stay: Payer: Medicare Other | Admitting: Family

## 2023-12-12 ENCOUNTER — Encounter: Payer: Self-pay | Admitting: Pulmonary Disease

## 2023-12-18 ENCOUNTER — Other Ambulatory Visit: Payer: Self-pay | Admitting: Cardiovascular Disease

## 2023-12-18 DIAGNOSIS — I4819 Other persistent atrial fibrillation: Secondary | ICD-10-CM

## 2023-12-18 NOTE — Telephone Encounter (Signed)
 Received email from Veneta Penton RN:  SOC date; 12/14/23  Therapy; Tyvaso DPI 16 mcg titrating up every 1-2 wks based on tolerance to a goal dose of 64 mcg or max tolerated dosage four times daily.  Next visit; 12/22/23  Education; Reviewed all aspects of disease process and Tyvaso DPI therapy.  Oxygen; 3.5 liters via [place] continuously  Summary;  Pt and her live-in boyfriend, Weyman Croon, were both taught therapy including possible side effects and how to mitigate. Weyman Croon will be a good supportive back-up partner for her. Pt states she likes to take her meds when she wants to and nurse advised that there is room for flexibility but treatment needs to be taken 4 times daily at least 4-5 hours apart and to try and not take doses after midnight (she mentioned if she could do a treatment at 2am if she wanted to.) Nurse advised pt to aim for some sort of routine and to keep track of treatment times each day to ensure she is getting in the 4 doses per day and not doing them too close together. Pt tolerated first dose well without side effects. Her breathing technique was good. BP range 110-120/60-70's.  Kind Regards,  Christen Butter MSN RN [facility] [place] [facility]  Chesley Mires, PharmD, MPH, BCPS, CPP Clinical Pharmacist (Rheumatology and Pulmonology)

## 2023-12-18 NOTE — Telephone Encounter (Signed)
 Xarelto 20mg  refill request received. Pt is 83 years old, weight-95.7kg, Crea-0.83 on 11/21/23, last seen by Dr. Royann Shivers on 07/21/23, Diagnosis-Afib, CrCl- 81.67 mL/min; Dose is appropriate based on dosing criteria. Will send in refill to requested pharmacy.

## 2023-12-19 ENCOUNTER — Telehealth: Payer: Self-pay | Admitting: Pulmonary Disease

## 2023-12-20 NOTE — Telephone Encounter (Signed)
 Faxed back 12/20/23 confirmation received

## 2023-12-25 DIAGNOSIS — E1169 Type 2 diabetes mellitus with other specified complication: Secondary | ICD-10-CM | POA: Diagnosis not present

## 2023-12-25 DIAGNOSIS — E785 Hyperlipidemia, unspecified: Secondary | ICD-10-CM | POA: Diagnosis not present

## 2023-12-25 DIAGNOSIS — I129 Hypertensive chronic kidney disease with stage 1 through stage 4 chronic kidney disease, or unspecified chronic kidney disease: Secondary | ICD-10-CM | POA: Diagnosis not present

## 2023-12-25 DIAGNOSIS — N183 Chronic kidney disease, stage 3 unspecified: Secondary | ICD-10-CM | POA: Diagnosis not present

## 2023-12-25 DIAGNOSIS — E559 Vitamin D deficiency, unspecified: Secondary | ICD-10-CM | POA: Diagnosis not present

## 2023-12-25 DIAGNOSIS — D500 Iron deficiency anemia secondary to blood loss (chronic): Secondary | ICD-10-CM | POA: Diagnosis not present

## 2024-01-02 ENCOUNTER — Telehealth: Payer: Self-pay | Admitting: Pharmacist

## 2024-01-02 NOTE — Telephone Encounter (Signed)
 Received email from [place], RN with [facility]:  [facility] date:12/14/23  Therapy; Tyvaso DPI  Next visit; 3/20 in-person  Oxygen 3.5 L via [place] continuously  Summary;  Pt was increased to 32 mcg Tyvaso DPI on 12/22/23. She was seen virtually today on 3/13. The only side effect she is reporting is that she feels tired all the time. Nurse confirmed that patient felt this way prior to starting Tyvaso and she says she just feels the same, still gets so tired doing anything. She had a MD visit with primary doctor on 3/10 and showed documentation of her BP being 104/52. She is currently taking Metoprolol 50 mg po daily and Losartan 50 mg daily. I would like to be able to increase her dosage to 48 mcg next week on 3/20 but I'm concerned about her BP reading from [facility] and she is keeping track of her BP at home and while on the virtual visit with her, she took her BP and it was 102/60. She denied any lightheadedness or dizziness. I would like guidance on if I should proceed with increasing her dosage if her BP is still running that low when I see on 3/20. Her weight today is 202lbs. She is trying to stay hydrated with water to help with BP. Just need a little insight on what Dr. Isaiah Serge would like for nurse to do at next visit if SBP still running in low 100's. She is a fall risk. Thank you.  Kind Regards,   Christen Butter MSN RN [facility] [place] [facility] [phone]  Called patient and advised that we will keep medication dose at four times daily. She dnies dizziness, headaches, lightheadedness. She does sit down after each Tyvaso inhalation session. Her last dose was 2pm. She measured BP while on phone with me today (BP 103/58 and HR 67bpm).  Will plan to keep dose at four times daily if SBP is in low 100s at next visit or patient is symptomatic or reports inadequate hydration, let's keep her dose at four times daily for another two weeks and re-assess BP from there.  Chesley Mires, PharmD,  MPH, BCPS, CPP Clinical Pharmacist (Rheumatology and Pulmonology)

## 2024-01-05 ENCOUNTER — Telehealth: Payer: Self-pay | Admitting: Pharmacist

## 2024-01-05 DIAGNOSIS — J849 Interstitial pulmonary disease, unspecified: Secondary | ICD-10-CM

## 2024-01-05 DIAGNOSIS — J84112 Idiopathic pulmonary fibrosis: Secondary | ICD-10-CM

## 2024-01-05 DIAGNOSIS — I2723 Pulmonary hypertension due to lung diseases and hypoxia: Secondary | ICD-10-CM

## 2024-01-05 MED ORDER — TYVASO DPI MAINTENANCE KIT 48 MCG IN POWD
48.0000 ug | Freq: Four times a day (QID) | RESPIRATORY_TRACT | 5 refills | Status: DC
Start: 2024-01-05 — End: 2024-04-17

## 2024-01-05 NOTE — Telephone Encounter (Signed)
 Received email from [place], [place] with [facility]:    Oxygen 3.5 L continuously  Summary;  I was able to increase Carrie Haynes to 48 mcg Tyvaso DPI today. Her BP readings today were better, per patient, her BP at noon was 106/62 but at 1:30pm she was 113/62. Prior to me increasing her, her BP was 109/74, post was 116/72, and post 45 min was 108/62. She denied any side effects. The patient and I discussed titration and she wants to stay on 48 mcg x 2 weeks (at least), before trying to go up to , so that's what we agreed on. She does not want to combine cartridges as that is somewhat confusing to her. [facility] is sending an Rx request for 48 mcg cartridges as she only has one week on hand and her need by date is by 01/11/24. Her next visit will be April 4th.  Thank you, Kind Regards,   Refill for Tyvaso DPI cartridges sent to [facility]  Chesley Mires, PharmD, MPH, BCPS, CPP Clinical Pharmacist (Rheumatology and Pulmonology)

## 2024-01-31 ENCOUNTER — Other Ambulatory Visit: Payer: Self-pay

## 2024-01-31 ENCOUNTER — Other Ambulatory Visit: Payer: Self-pay | Admitting: Pulmonary Disease

## 2024-01-31 ENCOUNTER — Other Ambulatory Visit (HOSPITAL_COMMUNITY): Payer: Self-pay

## 2024-01-31 DIAGNOSIS — J849 Interstitial pulmonary disease, unspecified: Secondary | ICD-10-CM

## 2024-01-31 NOTE — Progress Notes (Signed)
 Specialty Pharmacy Refill Coordination Note  Carrie Haynes is a 83 y.o. female contacted today regarding refills of specialty medication(s) Pirfenidone   Patient requested Delivery   Delivery date: 02/05/24   Verified address: [address], [place], [place] [address]   Medication will be filled when approved.

## 2024-02-01 ENCOUNTER — Other Ambulatory Visit (HOSPITAL_COMMUNITY): Payer: Self-pay

## 2024-02-02 ENCOUNTER — Other Ambulatory Visit (HOSPITAL_COMMUNITY): Payer: Self-pay

## 2024-02-02 ENCOUNTER — Other Ambulatory Visit: Payer: Self-pay

## 2024-02-06 ENCOUNTER — Other Ambulatory Visit (HOSPITAL_COMMUNITY): Payer: Self-pay

## 2024-02-06 ENCOUNTER — Other Ambulatory Visit: Payer: Self-pay

## 2024-02-06 MED ORDER — PIRFENIDONE 801 MG PO TABS
801.0000 mg | ORAL_TABLET | Freq: Three times a day (TID) | ORAL | 1 refills | Status: DC
  Filled 2024-02-06: qty 90, 30d supply, fill #0
  Filled 2024-03-06: qty 90, 30d supply, fill #1
  Filled 2024-04-05 – 2024-04-08 (×2): qty 90, 30d supply, fill #2
  Filled 2024-05-01: qty 90, 30d supply, fill #3
  Filled 2024-05-24 – 2024-06-20 (×4): qty 90, 30d supply, fill #4
  Filled 2024-08-06 – 2024-10-28 (×3): qty 90, 30d supply, fill #5

## 2024-02-06 NOTE — Telephone Encounter (Signed)
 Refill sent for PIRFENIDONE  to [facility]: [phone]   Dose: 801mg  three times daily  Last OV: 11/21/2023 Provider: Dr. Waylan Haggard Pertinent labs: LFTs on 11/21/23 wnl  Next OV: 02/19/24   Geraldene Kleine, PharmD, MPH, BCPS Clinical Pharmacist (Rheumatology and Pulmonology)

## 2024-02-19 ENCOUNTER — Ambulatory Visit: Payer: Medicare Other | Admitting: Pulmonary Disease

## 2024-02-19 ENCOUNTER — Encounter: Payer: Self-pay | Admitting: Pulmonary Disease

## 2024-02-19 VITALS — BP 118/60 | HR 85 | Ht 60.0 in | Wt 210.0 lb

## 2024-02-19 DIAGNOSIS — J84112 Idiopathic pulmonary fibrosis: Secondary | ICD-10-CM | POA: Diagnosis not present

## 2024-02-19 DIAGNOSIS — Z5181 Encounter for therapeutic drug level monitoring: Secondary | ICD-10-CM

## 2024-02-19 DIAGNOSIS — I272 Pulmonary hypertension, unspecified: Secondary | ICD-10-CM | POA: Diagnosis not present

## 2024-02-19 NOTE — Progress Notes (Addendum)
 Carrie Haynes    [id number]    Mar 19, 1941  Primary Care Physician:White, Montie, MD  Referring Physician: Teresa Montie, MD [phone] [address],  [place] [address]  Chief complaint: Follow-up for IPF Started Esbriet  in November 2022 Started Tyvaso  Feb 2025  HPI: 83 year old with history of hypertension, hyperlipidemia, atrial fibrillation Referred for evaluation of pulmonary fibrosis  She has CT scans showing progressive probable UIP pattern with worsening dyspnea.  She was started on supplemental oxygen  1 year ago Has chronic cough.  Denies any wheezing, sputum production, fevers, chills  History significant for atrial fibrillation for which she was on Xarelto .  She was on amiodarone  briefly for 3 months from July 2018 to October 2018  Started Esbriet  in 2022 Right heart catheterization in 2025 confirm pulmonary hypertension.  Started Tyvaso  in February 2025  Pets: No pets Occupation: Retired Sales executive Exposures: No mold, hot tub, Jacuzzi.  No feather pillows or comforter ILD questionnaire 08/06/2021-negative Smoking history: Never smoker Travel history: No significant travel history Relevant family history: No family history of lung disease  Interim history: Discussed the use of AI scribe software for clinical note transcription with the patient, who gave verbal consent to proceed.  History of Present Illness Carrie Haynes is an 83 year old female with pulmonary hypertension and idiopathic pulmonary fibrosis who presents for follow-up.  She started Tyvaso , an inhaled medication, on December 15, 2023, beginning with a dose of 16 mcg and gradually increasing to 64 mcg. The medication causes her to cough and sneeze upon inhalation, although the cough has become smoother with the current dose. She dislikes taking the medication due to these side effects.  She has been on Esbriet  (pirfenidone ) since 2022, taking it two to three times daily to  manage her idiopathic pulmonary fibrosis. She sometimes takes it three times a day.  She experiences significant shortness of breath with minimal exertion, such as walking 10 to 12 feet, which requires her to sit down to recover. This shortness of breath occurs even when she is not taking Tyvaso .  She is currently not taking any medication specifically for her cough, although she uses cough drops as needed. She is resistant to starting any new medications.  She monitors her blood pressure at home using a wearable device, noting that her readings are typically around 111/68 mmHg.    Outpatient Encounter Medications as of 02/19/2024  Medication Sig   atorvastatin  (LIPITOR) 20 MG tablet Take 20 mg by mouth every evening.   Cholecalciferol  (VITAMIN D -3) 5000 units TABS Take 5,000 Units by mouth daily.   dofetilide  (TIKOSYN ) 500 MCG capsule TAKE 1 CAPSULE BY MOUTH TWICE A DAY   FREESTYLE PRECISION NEO TEST test strip USE AS DIRECTED TO TEST BLOOD SUGAR DAILY E11.21   losartan (COZAAR) 50 MG tablet Take 50 mg by mouth daily.   LUMIGAN 0.01 % SOLN Place 1 drop into both eyes at bedtime.   Magnesium  Oxide 500 MG TABS Take 500 mg by mouth daily in the afternoon.   metFORMIN (GLUCOPHAGE-XR) 500 MG 24 hr tablet Take 500 mg by mouth at bedtime.   metoprolol  succinate (TOPROL -XL) 50 MG 24 hr tablet TAKE 1 TABLET BY MOUTH EVERY DAY   Pirfenidone  (ESBRIET ) 801 MG TABS Take 1 tablet (801 mg total) by mouth 3 (three) times daily with meals.   prednisoLONE Acet-Moxifloxacin 1-0.5 % SUSP Place 1 drop into the right eye 4 (four) times daily.   Treprostinil  (  TYVASO  DPI MAINTENANCE KIT) 48 MCG POWD Inhale 48 mcg into the lungs in the morning, at noon, in the evening, and at bedtime.   XARELTO  20 MG TABS tablet TAKE 1 TABLET BY MOUTH DAILY WITH SUPPER   No facility-administered encounter medications on file as of 02/19/2024.   Physical Exam: Blood pressure 118/60, pulse 85, height 5' (1.524 m), weight 210 lb (95.3  kg), SpO2 90%. Gen:      No acute distress HEENT:  EOMI, sclera anicteric Neck:     No masses; no thyromegaly Lungs:   Bibasal crackles CV:         Regular rate and rhythm; no murmurs Abd:      + bowel sounds; soft, non-tender; no palpable masses, no distension Ext:    No edema; adequate peripheral perfusion Skin:      Warm and dry; no rash Neuro: alert and oriented x 3 Psych: normal mood and affect   Data Reviewed: Imaging: CT high-resolution 07/21/2021- Worsening of interstitial lung disease and probable UIP pattern, dilatation of pulmonary trunk, moderate cardiomegaly, atherosclerosis and three-vessel coronary disease  CT high-resolution 07/25/2022 Pulmonary pattern of fibrosis which looks unchanged  High resolution CT 08/26/2023-stable pattern of pulmonary fibrosis. I have reviewed the images personally.  PFTs: 10/23/2017 FVC 1.76 [74%], FEV1 1.66 [93%], F/F 94, TLC 2.65 [59%], DLCO 9.34 [49%] Moderate restriction, severe diffusion defect  04/26/2022 FVC 1.28 [57%], FEV1 1.19 [72%], F/F 93, TLC 2.78 [62%], DLCO 9.62 [58%] Moderate restriction and diffusion defect.  11/21/2023 FVC 1.28 [59%], FEV1 1.11 [70%], F/F87, TLC 2.53 [56%], DLCO 5.17 [31%] Severe restriction, diffusion defect.  Worsening compared to 2023  Labs: CTD serologies 08/06/2021-negative proBNP 08/06/2021-742 Hepatic panel 09/16/2021-within normal limits  Labs from primary care dated 12/15/2021 CBC-WBC 8, hemoglobin 11.3, platelets 228, eosinophils 0.8%, BMP within normal limits.    Hepatic panel 04/26/2022 - within normal limits  Cardiac: Right heart catheterization 06/23/2023 RA = 4 RV = 71/10 PA = 68/21 (42) PCW = 10 Fick cardiac output/index = 4.2/2.2 PVR = 7.5 WU FA sat = 98% PA sat = 59% PAPi = 11.8 Moderate PAH with normal left sided pressures. 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 No significant CTD symptoms or exposures. CTD serologies and HP panel are negative. High probability for being IPF.  On Esbriet  (pirfenidone ) since 2022 to decelerate fibrosis progression. Currently adheres to twice daily dosing, with occasional compliance to thrice daily. Cough likely related to IPF-associated fibrosis. She is hesitant to introduce new medications due to polypharmacy concerns. - Continue Esbriet  (pirfenidone ) to slow fibrosis progression.  LFTs in February are normal.  Does not need labs today  Pulmonary Hypertension Pulmonary Hypertension secondary to lung disease, confirmed by right heart catheterization in 2024. On Tyvaso  (treprostinil ) inhalation therapy, titrated to 64 mcg, to alleviate right heart pressure and stabilize respiratory function. Reports cough and sneezing with inhalation, potentially linked to both medication and underlying pulmonary condition. Advised to maintain current therapy as it is essential for disease management. - Continue Tyvaso  (treprostinil ) inhalation therapy - Consider Tessalon for cough if symptoms exacerbate, though she prefers to avoid additional medications.  Goals of Care Aims to reach age 28, prioritizing stability in current health status. Prefers managing conditions with existing regimen and is open to video consultations to minimize travel burden. -  Schedule a video consultation in six months to assess condition. - Coordinate with primary care provider for lab work in August.  Plan/Recommendations: Continue Esbriet .  Encouraged to take 3 times a day Continue Tyvaso  Monitor labs Follow-up in 6 months  Lonna Coder MD [facility] 02/19/2024, 3:18 PM  CC: Teresa Channel, MD

## 2024-02-19 NOTE — Patient Instructions (Signed)
 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 We reviewed your current medications, including Esbriet  and Tyvaso , and addressed your concerns about side effects and shortness of breath. We also talked about your goals of care and planned for future follow-up.  YOUR PLAN:  -IDIOPATHIC PULMONARY FIBROSIS: Idiopathic Pulmonary Fibrosis (IPF) is a lung disease that causes scarring of the lung tissue, making it difficult to breathe. You will continue taking Esbriet  (pirfenidone ) to help slow down the progression of this scarring. Please continue with your current dosing schedule.  -PULMONARY HYPERTENSION: Pulmonary Hypertension is high blood pressure in the lungs' arteries, often due to lung disease. You will continue using Tyvaso  (treprostinil ) inhalation therapy at 64 mcg to help manage this condition. Although it causes coughing and sneezing, it is important for your treatment. If your cough worsens, we can consider adding Tessalon, but you have expressed a preference to avoid new medications.  -GOALS OF CARE: Your goal is to maintain your current health and reach age 83. We will continue with your current treatment plan and schedule a video consultation in six months to check on your condition. Additionally, we will coordinate with your primary care provider for lab work in August.  INSTRUCTIONS:  Please continue taking your medications as prescribed. We will schedule a video consultation in six months to assess your condition. Additionally, please coordinate with your primary care provider to have lab work done in August.

## 2024-02-20 ENCOUNTER — Telehealth: Payer: Self-pay | Admitting: Cardiovascular Disease

## 2024-02-20 NOTE — Telephone Encounter (Signed)
 Spoke with the patient. She said that she will be able to give bp, hr, and weight. Scheduled a MyChart Visit for 05/14/24 at 08:40.   She was very thankful that Dr Alvis Ba agreed to [facility] visit. She said she is doing well, it just takes a lot out of her to get out of the house.

## 2024-02-20 NOTE — Telephone Encounter (Signed)
 Patient would like to know if her next visit can be a virtual visit. She states its hard to get to appointments and she just not up to all that right now.

## 2024-02-20 NOTE — Telephone Encounter (Signed)
 Yes, it can be a virtual visit as long as she can give me HR, BP and weight

## 2024-02-20 NOTE — Telephone Encounter (Signed)
 Pt called in to sch 6 mon f/u. She states it is hard to get around right now, she asked if her next appt can be mychart visit.

## 2024-03-01 ENCOUNTER — Other Ambulatory Visit: Payer: Self-pay

## 2024-03-05 ENCOUNTER — Other Ambulatory Visit (HOSPITAL_COMMUNITY): Payer: Self-pay

## 2024-03-06 ENCOUNTER — Other Ambulatory Visit: Payer: Self-pay

## 2024-03-06 NOTE — Progress Notes (Signed)
 Specialty Pharmacy Refill Coordination Note  Carrie Haynes is a 83 y.o. female contacted today regarding refills of specialty medication(s) Pirfenidone    Patient requested Delivery   Delivery date: 03/08/24   Verified address: [address] [place], [place] [address]   Medication will be filled on 03/07/24.

## 2024-04-02 ENCOUNTER — Other Ambulatory Visit: Payer: Self-pay

## 2024-04-02 DIAGNOSIS — E1169 Type 2 diabetes mellitus with other specified complication: Secondary | ICD-10-CM | POA: Diagnosis not present

## 2024-04-02 DIAGNOSIS — I480 Paroxysmal atrial fibrillation: Secondary | ICD-10-CM | POA: Diagnosis not present

## 2024-04-02 DIAGNOSIS — I420 Dilated cardiomyopathy: Secondary | ICD-10-CM | POA: Diagnosis not present

## 2024-04-02 DIAGNOSIS — I5032 Chronic diastolic (congestive) heart failure: Secondary | ICD-10-CM | POA: Diagnosis not present

## 2024-04-05 ENCOUNTER — Other Ambulatory Visit: Payer: Self-pay

## 2024-04-08 ENCOUNTER — Other Ambulatory Visit (HOSPITAL_COMMUNITY): Payer: Self-pay

## 2024-04-08 ENCOUNTER — Other Ambulatory Visit: Payer: Self-pay

## 2024-04-08 NOTE — Progress Notes (Signed)
 Specialty Pharmacy Refill Coordination Note  Carrie Haynes is a 83 y.o. female contacted today regarding refills of specialty medication(s) Pirfenidone    Patient requested Delivery   Delivery date: 04/10/24   Verified address: [address] [name], [place] [address]   Medication will be filled on 04/09/24.

## 2024-04-16 ENCOUNTER — Emergency Department (HOSPITAL_COMMUNITY)

## 2024-04-16 ENCOUNTER — Inpatient Hospital Stay (HOSPITAL_COMMUNITY)
Admission: EM | Admit: 2024-04-16 | Discharge: 2024-04-22 | DRG: 291 | Disposition: A | Discharge status: Home / Self Care | Attending: Internal Medicine | Admitting: Internal Medicine

## 2024-04-16 ENCOUNTER — Encounter (HOSPITAL_COMMUNITY): Payer: Self-pay

## 2024-04-16 ENCOUNTER — Other Ambulatory Visit: Payer: Self-pay

## 2024-04-16 ENCOUNTER — Ambulatory Visit: Payer: Self-pay

## 2024-04-16 DIAGNOSIS — I440 Atrioventricular block, first degree: Secondary | ICD-10-CM | POA: Diagnosis present

## 2024-04-16 DIAGNOSIS — R918 Other nonspecific abnormal finding of lung field: Secondary | ICD-10-CM | POA: Diagnosis not present

## 2024-04-16 DIAGNOSIS — I959 Hypotension, unspecified: Secondary | ICD-10-CM | POA: Diagnosis not present

## 2024-04-16 DIAGNOSIS — Z9981 Dependence on supplemental oxygen: Secondary | ICD-10-CM

## 2024-04-16 DIAGNOSIS — E871 Hypo-osmolality and hyponatremia: Secondary | ICD-10-CM | POA: Diagnosis present

## 2024-04-16 DIAGNOSIS — E785 Hyperlipidemia, unspecified: Secondary | ICD-10-CM | POA: Diagnosis not present

## 2024-04-16 DIAGNOSIS — Z7901 Long term (current) use of anticoagulants: Secondary | ICD-10-CM

## 2024-04-16 DIAGNOSIS — I50810 Right heart failure, unspecified: Secondary | ICD-10-CM | POA: Diagnosis not present

## 2024-04-16 DIAGNOSIS — I5033 Acute on chronic diastolic (congestive) heart failure: Secondary | ICD-10-CM | POA: Diagnosis present

## 2024-04-16 DIAGNOSIS — I4819 Other persistent atrial fibrillation: Secondary | ICD-10-CM | POA: Diagnosis not present

## 2024-04-16 DIAGNOSIS — I110 Hypertensive heart disease with heart failure: Secondary | ICD-10-CM | POA: Diagnosis not present

## 2024-04-16 DIAGNOSIS — Z6841 Body Mass Index (BMI) 40.0 and over, adult: Secondary | ICD-10-CM

## 2024-04-16 DIAGNOSIS — I517 Cardiomegaly: Secondary | ICD-10-CM | POA: Diagnosis not present

## 2024-04-16 DIAGNOSIS — N179 Acute kidney failure, unspecified: Secondary | ICD-10-CM | POA: Diagnosis not present

## 2024-04-16 DIAGNOSIS — Z79899 Other long term (current) drug therapy: Secondary | ICD-10-CM

## 2024-04-16 DIAGNOSIS — I272 Pulmonary hypertension, unspecified: Secondary | ICD-10-CM | POA: Diagnosis present

## 2024-04-16 DIAGNOSIS — I5031 Acute diastolic (congestive) heart failure: Secondary | ICD-10-CM | POA: Diagnosis not present

## 2024-04-16 DIAGNOSIS — R531 Weakness: Secondary | ICD-10-CM | POA: Diagnosis not present

## 2024-04-16 DIAGNOSIS — R1310 Dysphagia, unspecified: Secondary | ICD-10-CM | POA: Diagnosis not present

## 2024-04-16 DIAGNOSIS — I10 Essential (primary) hypertension: Secondary | ICD-10-CM | POA: Diagnosis not present

## 2024-04-16 DIAGNOSIS — I2721 Secondary pulmonary arterial hypertension: Secondary | ICD-10-CM | POA: Diagnosis present

## 2024-04-16 DIAGNOSIS — Z7984 Long term (current) use of oral hypoglycemic drugs: Secondary | ICD-10-CM | POA: Diagnosis not present

## 2024-04-16 DIAGNOSIS — Z888 Allergy status to other drugs, medicaments and biological substances status: Secondary | ICD-10-CM

## 2024-04-16 DIAGNOSIS — J84112 Idiopathic pulmonary fibrosis: Secondary | ICD-10-CM | POA: Diagnosis not present

## 2024-04-16 DIAGNOSIS — J690 Pneumonitis due to inhalation of food and vomit: Secondary | ICD-10-CM | POA: Diagnosis not present

## 2024-04-16 DIAGNOSIS — E119 Type 2 diabetes mellitus without complications: Secondary | ICD-10-CM | POA: Diagnosis present

## 2024-04-16 DIAGNOSIS — J8410 Pulmonary fibrosis, unspecified: Secondary | ICD-10-CM | POA: Diagnosis present

## 2024-04-16 DIAGNOSIS — R609 Edema, unspecified: Secondary | ICD-10-CM | POA: Diagnosis not present

## 2024-04-16 DIAGNOSIS — I480 Paroxysmal atrial fibrillation: Secondary | ICD-10-CM | POA: Diagnosis not present

## 2024-04-16 DIAGNOSIS — E66813 Obesity, class 3: Secondary | ICD-10-CM | POA: Diagnosis not present

## 2024-04-16 DIAGNOSIS — Z5181 Encounter for therapeutic drug level monitoring: Secondary | ICD-10-CM | POA: Diagnosis not present

## 2024-04-16 DIAGNOSIS — Z8249 Family history of ischemic heart disease and other diseases of the circulatory system: Secondary | ICD-10-CM | POA: Diagnosis not present

## 2024-04-16 DIAGNOSIS — R600 Localized edema: Secondary | ICD-10-CM | POA: Diagnosis not present

## 2024-04-16 DIAGNOSIS — R0600 Dyspnea, unspecified: Secondary | ICD-10-CM | POA: Diagnosis not present

## 2024-04-16 DIAGNOSIS — I2609 Other pulmonary embolism with acute cor pulmonale: Secondary | ICD-10-CM | POA: Diagnosis not present

## 2024-04-16 DIAGNOSIS — J9611 Chronic respiratory failure with hypoxia: Secondary | ICD-10-CM | POA: Diagnosis present

## 2024-04-16 DIAGNOSIS — E1169 Type 2 diabetes mellitus with other specified complication: Secondary | ICD-10-CM

## 2024-04-16 DIAGNOSIS — R0609 Other forms of dyspnea: Secondary | ICD-10-CM

## 2024-04-16 DIAGNOSIS — I4891 Unspecified atrial fibrillation: Secondary | ICD-10-CM | POA: Diagnosis present

## 2024-04-16 DIAGNOSIS — J984 Other disorders of lung: Secondary | ICD-10-CM | POA: Diagnosis not present

## 2024-04-16 DIAGNOSIS — R0602 Shortness of breath: Secondary | ICD-10-CM | POA: Diagnosis not present

## 2024-04-16 LAB — COMPREHENSIVE METABOLIC PANEL WITH GFR
ALT: 20 U/L (ref 0–44)
AST: 32 U/L (ref 15–41)
Albumin: 2.9 g/dL — ABNORMAL LOW (ref 3.5–5.0)
Alkaline Phosphatase: 71 U/L (ref 38–126)
Anion gap: 10 (ref 5–15)
BUN: 23 mg/dL (ref 8–23)
CO2: 24 mmol/L (ref 22–32)
Calcium: 8.7 mg/dL — ABNORMAL LOW (ref 8.9–10.3)
Chloride: 96 mmol/L — ABNORMAL LOW (ref 98–111)
Creatinine, Ser: 1.01 mg/dL — ABNORMAL HIGH (ref 0.44–1.00)
GFR, Estimated: 56 mL/min — ABNORMAL LOW (ref >60)
Glucose, Bld: 113 mg/dL — ABNORMAL HIGH (ref 70–99)
Potassium: 5.1 mmol/L (ref 3.5–5.1)
Sodium: 130 mmol/L — ABNORMAL LOW (ref 135–145)
Total Bilirubin: 0.8 mg/dL (ref 0.0–1.2)
Total Protein: 6.6 g/dL (ref 6.5–8.1)

## 2024-04-16 LAB — CBC WITH DIFFERENTIAL/PLATELET
Abs Immature Granulocytes: 0.02 10*3/uL (ref 0.00–0.07)
Basophils Absolute: 0 10*3/uL (ref 0.0–0.1)
Basophils Relative: 1 %
Eosinophils Absolute: 0 10*3/uL (ref 0.0–0.5)
Eosinophils Relative: 1 %
HCT: 34.9 % — ABNORMAL LOW (ref 36.0–46.0)
Hemoglobin: 10.7 g/dL — ABNORMAL LOW (ref 12.0–15.0)
Immature Granulocytes: 0 %
Lymphocytes Relative: 16 %
Lymphs Abs: 0.8 10*3/uL (ref 0.7–4.0)
MCH: 28.8 pg (ref 26.0–34.0)
MCHC: 30.7 g/dL (ref 30.0–36.0)
MCV: 93.8 fL (ref 80.0–100.0)
Monocytes Absolute: 0.5 10*3/uL (ref 0.1–1.0)
Monocytes Relative: 9 %
Neutro Abs: 3.7 10*3/uL (ref 1.7–7.7)
Neutrophils Relative %: 73 %
Platelets: 173 10*3/uL (ref 150–400)
RBC: 3.72 MIL/uL — ABNORMAL LOW (ref 3.87–5.11)
RDW: 15.5 % (ref 11.5–15.5)
WBC: 5.1 10*3/uL (ref 4.0–10.5)
nRBC: 0 % (ref 0.0–0.2)

## 2024-04-16 LAB — CBG MONITORING, ED: Glucose-Capillary: 95 mg/dL (ref 70–99)

## 2024-04-16 LAB — BRAIN NATRIURETIC PEPTIDE: B Natriuretic Peptide: 532.1 pg/mL — ABNORMAL HIGH (ref 0.0–100.0)

## 2024-04-16 LAB — TROPONIN I (HIGH SENSITIVITY)
Troponin I (High Sensitivity): 11 ng/L (ref <18)
Troponin I (High Sensitivity): 12 ng/L (ref <18)

## 2024-04-16 MED ORDER — FUROSEMIDE 10 MG/ML IJ SOLN
40.0000 mg | Freq: Once | INTRAMUSCULAR | Status: AC
  Administered 2024-04-16: 40 mg via INTRAVENOUS
  Filled 2024-04-16: qty 4

## 2024-04-16 MED ORDER — INSULIN ASPART 100 UNIT/ML IJ SOLN
0.0000 [IU] | Freq: Three times a day (TID) | INTRAMUSCULAR | Status: DC
  Administered 2024-04-17 – 2024-04-19 (×4): 1 [IU] via SUBCUTANEOUS
  Administered 2024-04-19: 2 [IU] via SUBCUTANEOUS
  Administered 2024-04-20 – 2024-04-22 (×4): 1 [IU] via SUBCUTANEOUS

## 2024-04-16 MED ORDER — ACETAMINOPHEN 325 MG PO TABS
650.0000 mg | ORAL_TABLET | Freq: Four times a day (QID) | ORAL | Status: DC | PRN
  Administered 2024-04-20: 650 mg via ORAL
  Filled 2024-04-16 (×2): qty 2

## 2024-04-16 MED ORDER — INSULIN ASPART 100 UNIT/ML IJ SOLN
0.0000 [IU] | Freq: Every day | INTRAMUSCULAR | Status: DC
  Administered 2024-04-19: 2 [IU] via SUBCUTANEOUS

## 2024-04-16 MED ORDER — LOSARTAN POTASSIUM 50 MG PO TABS
50.0000 mg | ORAL_TABLET | Freq: Every day | ORAL | Status: DC
  Administered 2024-04-17 – 2024-04-19 (×3): 50 mg via ORAL
  Filled 2024-04-16 (×3): qty 1

## 2024-04-16 MED ORDER — METOPROLOL SUCCINATE ER 50 MG PO TB24
50.0000 mg | ORAL_TABLET | Freq: Every day | ORAL | Status: DC
  Administered 2024-04-17 – 2024-04-19 (×3): 50 mg via ORAL
  Filled 2024-04-16 (×3): qty 1

## 2024-04-16 MED ORDER — PIRFENIDONE 801 MG PO TABS
801.0000 mg | ORAL_TABLET | Freq: Three times a day (TID) | ORAL | Status: DC
  Administered 2024-04-20 – 2024-04-22 (×7): 801 mg via ORAL
  Filled 2024-04-16 (×11): qty 1

## 2024-04-16 MED ORDER — SENNOSIDES-DOCUSATE SODIUM 8.6-50 MG PO TABS: 1.0000 | ORAL_TABLET | Freq: Every evening | ORAL | Status: DC | PRN

## 2024-04-16 MED ORDER — SODIUM CHLORIDE 0.9% FLUSH
3.0000 mL | Freq: Two times a day (BID) | INTRAVENOUS | Status: DC
  Administered 2024-04-16 – 2024-04-22 (×12): 3 mL via INTRAVENOUS

## 2024-04-16 MED ORDER — DOFETILIDE 250 MCG PO CAPS
500.0000 ug | ORAL_CAPSULE | Freq: Two times a day (BID) | ORAL | Status: DC
  Administered 2024-04-17 – 2024-04-19 (×6): 500 ug via ORAL
  Filled 2024-04-16: qty 2
  Filled 2024-04-16: qty 1
  Filled 2024-04-16: qty 2
  Filled 2024-04-16: qty 1
  Filled 2024-04-16 (×3): qty 2

## 2024-04-16 MED ORDER — FUROSEMIDE 10 MG/ML IJ SOLN
40.0000 mg | Freq: Two times a day (BID) | INTRAMUSCULAR | Status: DC
  Administered 2024-04-17 – 2024-04-19 (×5): 40 mg via INTRAVENOUS
  Filled 2024-04-16 (×5): qty 4

## 2024-04-16 MED ORDER — ATORVASTATIN CALCIUM 10 MG PO TABS
20.0000 mg | ORAL_TABLET | Freq: Every evening | ORAL | Status: DC
  Administered 2024-04-17 – 2024-04-21 (×5): 20 mg via ORAL
  Filled 2024-04-16 (×6): qty 2

## 2024-04-16 MED ORDER — RIVAROXABAN 20 MG PO TABS
20.0000 mg | ORAL_TABLET | Freq: Every day | ORAL | Status: DC
  Administered 2024-04-17 – 2024-04-21 (×5): 20 mg via ORAL
  Filled 2024-04-16 (×5): qty 1

## 2024-04-16 MED ORDER — ACETAMINOPHEN 650 MG RE SUPP: 650.0000 mg | Freq: Four times a day (QID) | RECTAL | Status: DC | PRN

## 2024-04-16 MED ORDER — OXYCODONE HCL 5 MG PO TABS
2.5000 mg | ORAL_TABLET | ORAL | Status: DC | PRN
  Administered 2024-04-17 – 2024-04-20 (×6): 5 mg via ORAL
  Filled 2024-04-16 (×6): qty 1

## 2024-04-16 NOTE — Telephone Encounter (Signed)
 FYI- Pt has gone to ED

## 2024-04-16 NOTE — ED Provider Notes (Signed)
 [place] EMERGENCY DEPARTMENT AT [facility] Provider Note   CSN: [id number] Arrival date & time: 04/16/24  1403     Patient presents with: Shortness of Breath and Leg Swelling   Carrie Haynes is a 83 y.o. female.   83 year old female presents for evaluation of shortness of breath and leg swelling.  She has a history of pulmonary fibrosis and is on 4 L all the time.  States over the last 2 weeks she has been more short of breath with walking and noticed increased swelling in her bilateral lower extremities.  She is not on a water pill at home.  She admits to some chest tightness but denies any other symptoms or concerns at this time.   Shortness of Breath Associated symptoms: no abdominal pain, no chest pain, no cough, no ear pain, no fever, no rash, no sore throat and no vomiting        Prior to Admission medications   Medication Sig Start Date End Date Taking? Authorizing Provider  atorvastatin  (LIPITOR) 20 MG tablet Take 20 mg by mouth every evening. 08/13/20   [provider]  Cholecalciferol  (VITAMIN D -3) 5000 units TABS Take 5,000 Units by mouth daily.    [provider]  dofetilide  (TIKOSYN ) 500 MCG capsule TAKE 1 CAPSULE BY MOUTH TWICE A DAY 11/13/23   Croitoru, Mihai, MD  FREESTYLE PRECISION NEO TEST test strip USE AS DIRECTED TO TEST BLOOD SUGAR DAILY E11.21 09/06/19   [provider]  losartan (COZAAR) 50 MG tablet Take 50 mg by mouth daily. 12/09/19   [provider]  LUMIGAN 0.01 % SOLN Place 1 drop into both eyes at bedtime. 05/12/23   [provider]  Magnesium  Oxide 500 MG TABS Take 500 mg by mouth daily in the afternoon. 12/16/21   [provider]  metFORMIN (GLUCOPHAGE-XR) 500 MG 24 hr tablet Take 500 mg by mouth at bedtime.    [provider]  metoprolol  succinate (TOPROL -XL) 50 MG 24 hr tablet TAKE 1 TABLET BY MOUTH EVERY DAY 10/20/23   Croitoru, Mihai, MD  Pirfenidone  (ESBRIET ) 801 MG TABS  Take 1 tablet (801 mg total) by mouth 3 (three) times daily with meals. 02/06/24   Mannam, Praveen, MD  prednisoLONE Acet-Moxifloxacin 1-0.5 % SUSP Place 1 drop into the right eye 4 (four) times daily. 05/22/23   [provider]  Treprostinil  (TYVASO  DPI MAINTENANCE KIT) 48 MCG POWD Inhale 48 mcg into the lungs in the morning, at noon, in the evening, and at bedtime. 01/05/24   Mannam, Praveen, MD  XARELTO  20 MG TABS tablet TAKE 1 TABLET BY MOUTH DAILY WITH SUPPER 12/18/23   Croitoru, Mihai, MD    Allergies: Calcium  channel blockers, Estrogens conjugated, and Nystatin    Review of Systems  Constitutional:  Negative for chills and fever.  HENT:  Negative for ear pain and sore throat.   Eyes:  Negative for pain and visual disturbance.  Respiratory:  Positive for shortness of breath. Negative for cough.   Cardiovascular:  Positive for leg swelling. Negative for chest pain and palpitations.  Gastrointestinal:  Negative for abdominal pain and vomiting.  Genitourinary:  Negative for dysuria and hematuria.  Musculoskeletal:  Negative for arthralgias and back pain.  Skin:  Negative for color change and rash.  Neurological:  Negative for seizures and syncope.  All other systems reviewed and are negative.   Updated Vital Signs BP 99/83   Pulse 70   Temp 97.9 F (36.6 C) (Oral)  Resp 19   Wt 94.8 kg   SpO2 100%   BMI 40.82 kg/m   Physical Exam Vitals and nursing note reviewed.  Constitutional:      General: She is not in acute distress.    Appearance: She is well-developed. She is not ill-appearing.  HENT:     Head: Normocephalic and atraumatic.  Eyes:     Conjunctiva/sclera: Conjunctivae normal.  Cardiovascular:     Rate and Rhythm: Normal rate and regular rhythm.     Heart sounds: No murmur heard. Pulmonary:     Effort: Pulmonary effort is normal. No respiratory distress.     Breath sounds: Normal breath sounds. No decreased breath sounds.  Abdominal:     Palpations:  Abdomen is soft.     Tenderness: There is no abdominal tenderness.  Musculoskeletal:        General: No swelling.     Cervical back: Neck supple.     Right lower leg: Edema present.     Left lower leg: Edema present.  Skin:    General: Skin is warm and dry.     Capillary Refill: Capillary refill takes less than 2 seconds.  Neurological:     Mental Status: She is alert.  Psychiatric:        Mood and Affect: Mood normal.     (all labs ordered are listed, but only abnormal results are displayed) Labs Reviewed  CBC WITH DIFFERENTIAL/PLATELET - Abnormal; Notable for the following components:      Result Value   RBC 3.72 (*)    Hemoglobin 10.7 (*)    HCT 34.9 (*)    All other components within normal limits  COMPREHENSIVE METABOLIC PANEL WITH GFR - Abnormal; Notable for the following components:   Sodium 130 (*)    Chloride 96 (*)    Glucose, Bld 113 (*)    Creatinine, Ser 1.01 (*)    Calcium  8.7 (*)    Albumin 2.9 (*)    GFR, Estimated 56 (*)    All other components within normal limits  BRAIN NATRIURETIC PEPTIDE - Abnormal; Notable for the following components:   B Natriuretic Peptide 532.1 (*)    All other components within normal limits  HEMOGLOBIN A1C  BASIC METABOLIC PANEL WITH GFR  MAGNESIUM   CBC  CBG MONITORING, ED  TROPONIN I (HIGH SENSITIVITY)  TROPONIN I (HIGH SENSITIVITY)    EKG: EKG Interpretation Date/Time:  Tuesday April 16 2024 14:27:21 EDT Ventricular Rate:  70 PR Interval:  210 QRS Duration:  88 QT Interval:  410 QTC Calculation: 442 R Axis:   -73  Text Interpretation: Sinus rhythm with 1st degree A-V block Left axis deviation Low voltage QRS  Age indeterminate anterolateral infarct Abnormal ECG When compared with ECG of 21-Jul-2023 08:31, Confirmed by Gennaro Bouchard ([id number]) on 04/16/2024 5:18:38 PM  Radiology: [facility] Chest 2 View Result Date: 04/16/2024 CLINICAL DATA:  Short of breath. EXAM: CHEST - 2 VIEW COMPARISON:  08/05/2020.  High-resolution  chest CT, 08/28/2023. FINDINGS: Cardiac silhouette is mildly enlarged, stable. No mediastinal or hilar masses. No convincing adenopathy. Bilateral interstitial thickening is stable. No lung consolidation or convincing edema. No pleural effusion or pneumothorax. Skeletal structures are demineralized, but intact. IMPRESSION: 1. No acute cardiopulmonary disease. 2. Chronic interstitial thickening consistent with interstitial lung disease. Stable cardiomegaly. Electronically Signed   By: Alm Parkins M.D.   On: 04/16/2024 15:29     Procedures   Medications Ordered in the ED  atorvastatin  (LIPITOR) tablet 20 mg (has  no administration in time range)  dofetilide  (TIKOSYN ) capsule 500 mcg (has no administration in time range)  losartan (COZAAR) tablet 50 mg (has no administration in time range)  metoprolol  succinate (TOPROL -XL) 24 hr tablet 50 mg (has no administration in time range)  rivaroxaban  (XARELTO ) tablet 20 mg (has no administration in time range)  Pirfenidone  TABS 801 mg (has no administration in time range)  insulin  aspart (novoLOG ) injection 0-6 Units (has no administration in time range)  insulin  aspart (novoLOG ) injection 0-5 Units ( Subcutaneous Not Given 04/16/24 2120)  sodium chloride  flush (NS) 0.9 % injection 3 mL (3 mLs Intravenous Given 04/16/24 2121)  acetaminophen  (TYLENOL ) tablet 650 mg (has no administration in time range)    Or  acetaminophen  (TYLENOL ) suppository 650 mg (has no administration in time range)  oxyCODONE  (Oxy IR/ROXICODONE ) immediate release tablet 2.5-5 mg (has no administration in time range)  senna-docusate (Senokot-S) tablet 1 tablet (has no administration in time range)  furosemide  (LASIX ) injection 40 mg (has no administration in time range)  furosemide  (LASIX ) injection 40 mg (40 mg Intravenous Given 04/16/24 1841)  furosemide  (LASIX ) injection 40 mg (40 mg Intravenous Given 04/16/24 1944)                                    Medical Decision  Making Medical Decision Making Nursing notes are reviewed. Differential diagnosis for this patient would include but not limited to: Pulmonary fibrosis, interstitial lung disease, pneumonia, CHF, other  Records reviewed: Prior lab workup reviewed and patient has a higher BNP today than in previous, usually she is around 200  Studies: Chest x-ray reviewed and interpreted independently of radiology and shows no evidence of acute abnormality  EKG interpretation: Interpreted by me in the absence of cardiology and shows sinus rhythm, normal intervals and no STEMI without acute change when compared to prior EKG  Cardiac monitor interpretation: Sinus rhythm, no ectopy  Emergency Department Course:  Vital signs and pulse oximetry are reviewed, evaluated by myself and found to be within normal limits prior to final disposition. Findings of laboratory testing and medical imaging are discussed with patient and family that is available. Patient agrees with the medical care plan as follows:  Patient's lab workup reviewed by me and her BNP is slightly elevated more so than usual.  She does have significant leg swelling I did give her some IV Lasix .  She is very short of breath just moving around in the bed and she is been very short of breath with walking at all at home which is new for her.  Discussed patient's case with hospitalist and patient will be admitted for further workup and management.  Patient and family are agreeable with the plan.  Problems Addressed: Dyspnea on exertion: undiagnosed new problem with uncertain prognosis Peripheral edema: undiagnosed new problem with uncertain prognosis  Amount and/or Complexity of Data Reviewed External Data Reviewed: notes. Labs: ordered. Decision-making details documented in ED Course. Radiology: ordered and independent interpretation performed. Decision-making details documented in ED Course. ECG/medicine tests: ordered and independent interpretation  performed. Decision-making details documented in ED Course.  Risk Prescription drug management. Drug therapy requiring intensive monitoring for toxicity. Decision regarding hospitalization.     Final diagnoses:  Peripheral edema  Dyspnea on exertion    ED Discharge Orders     None          Gennaro Duwaine CROME, DO 04/16/24 2315

## 2024-04-16 NOTE — Telephone Encounter (Signed)
 Reason for Disposition . [1] Difficulty breathing with exertion (e.g., walking) AND [2] new-onset or worsening  Protocols used: Leg Swelling and Edema-A-AH

## 2024-04-16 NOTE — ED Provider Triage Note (Signed)
 Emergency Medicine Provider Triage Evaluation Note  Carrie Haynes , a 83 y.o. female  was evaluated in triage.  Pt complains of shortness of breath and bilateral leg swelling worsening over the past week.  On 2 L nasal cannula baseline, increased requirement.  No chest pain or worsening cough.  Review of Systems  Positive:  Negative:   Physical Exam  BP (!) 130/104   Pulse 67   Temp 98.1 F (36.7 C)   Resp 18   Wt 94.8 kg   SpO2 100%   BMI 40.82 kg/m  Gen:   Awake, no distress   Resp:  Normal effort  MSK:   Moves extremities without difficulty, bilateral 3+ pitting edema, DP/PT pulses 2+ Other:    Medical Decision Making  Medically screening exam initiated at 2:49 PM.  Appropriate orders placed.  MALLERY HARSHMAN was informed that the remainder of the evaluation will be completed by another provider, this initial triage assessment does not replace that evaluation, and the importance of remaining in the ED until their evaluation is complete.     Donnajean Lynwood DEL, PA-C 04/16/24 1450

## 2024-04-16 NOTE — ED Triage Notes (Signed)
 BIB EMS/ chronic issue/ hx of pulmonary fibrosis/ pt c/o bil leg swelling/ SHOB w/ exertion has worsened over the past few day/ 4L O2 @ home/ A&OX4

## 2024-04-16 NOTE — H&P (Signed)
 History and Physical    Carrie Haynes FMW:[id number] DOB: 12/03/1940 DOA: 04/16/2024  PCP: Teresa Channel, MD   Patient coming from: Home   Chief Complaint: SOB, leg swelling  HPI: Carrie Haynes is a 83 y.o. female with medical history significant for interstitial lung disease, chronic hypoxic respiratory failure, type 2 diabetes mellitus, hypertension, atrial fibrillation on Xarelto , and pulmonary hypertension, now presenting with bilateral lower extremity edema and worsening shortness of breath.  Patient reports that she had not experienced leg swelling 9 years until recently developing edema in the bilateral lower extremities.  She has also been experiencing worsening shortness of breath and decreased activity tolerance.  She denies any change in her chronic cough, denies fevers, and denies chest pain.  ED Course: Upon arrival to the ED, patient is found to be afebrile and saturating well on her usual 4 L/min of supplemental oxygen  with normal HR and labile BP.  Labs are most notable for sodium 130, creatinine 1.10, normal WBC, hemoglobin 10.7, normal troponin x 2, and BNP 532.  Chest x-ray is negative for acute findings.  Patient was given 80 mg IV Lasix  in the ED.  Review of Systems:  All other systems reviewed and apart from HPI, are negative.  Past Medical History:  Diagnosis Date   A-fib (HCC)    Diabetes mellitus without complication (HCC)    Hyperlipemia    Hypertension     Past Surgical History:  Procedure Laterality Date   CARDIOVERSION N/A 05/18/2017   Procedure: CARDIOVERSION;  Surgeon: Delford Maude BROCKS, MD;  Location: [facility] ENDOSCOPY;  Service: Cardiovascular;  Laterality: N/A;   CARDIOVERSION N/A 05/23/2017   Procedure: CARDIOVERSION;  Surgeon: Shlomo Wilbert SAUNDERS, MD;  Location: [facility] ENDOSCOPY;  Service: Cardiovascular;  Laterality: N/A;   CARDIOVERSION N/A 08/28/2019   Procedure: CARDIOVERSION;  Surgeon: Jeffrie Oneil BROCKS, MD;  Location: [facility] ENDOSCOPY;  Service:  Cardiovascular;  Laterality: N/A;   COLONOSCOPY N/A 08/07/2020   Procedure: COLONOSCOPY;  Surgeon: Burnette Fallow, MD;  Location: [facility] ENDOSCOPY;  Service: Endoscopy;  Laterality: N/A;   ESOPHAGOGASTRODUODENOSCOPY N/A 08/07/2020   Procedure: ESOPHAGOGASTRODUODENOSCOPY (EGD);  Surgeon: Burnette Fallow, MD;  Location: [facility] ENDOSCOPY;  Service: Endoscopy;  Laterality: N/A;   GIVENS CAPSULE STUDY N/A 08/09/2020   Procedure: GIVENS CAPSULE STUDY;  Surgeon: Burnette Fallow, MD;  Location[facility] ENDOSCOPY;  Service: Endoscopy;  Laterality: N/A;   RIGHT HEART CATH N/A 06/23/2023   Procedure: RIGHT HEART CATH;  Surgeon: Cherrie Toribio SAUNDERS, MD;  Location: [facility] INVASIVE CV LAB;  Service: Cardiovascular;  Laterality: N/A;   RIGHT/LEFT HEART CATH AND CORONARY ANGIOGRAPHY N/A 05/12/2017   Procedure: Right/Left Heart Cath and Coronary Angiography;  Surgeon: Claudene Victory ORN, MD;  Location: [facility] INVASIVE CV LAB;  Service: Cardiovascular;  Laterality: N/A;   TEE WITHOUT CARDIOVERSION N/A 05/18/2017   Procedure: TRANSESOPHAGEAL ECHOCARDIOGRAM (TEE);  Surgeon: Delford Maude BROCKS, MD;  Location: [facility] ENDOSCOPY;  Service: Cardiovascular;  Laterality: N/A;    Social History:   reports that she has never smoked. She has never used smokeless tobacco. She reports that she does not drink alcohol and does not use drugs.  Allergies  Allergen Reactions   Calcium  Channel Blockers Other (See Comments)    Acute hypotension, patient became brady/asystole < 5 seconds   Estrogens Conjugated Other (See Comments)    Other reaction(s): DVT   Nystatin Hives    Family History  Problem Relation Age of Onset   Hypertension Father    Heart attack Sister    Breast  cancer Other      Prior to Admission medications   Medication Sig Start Date End Date Taking? Authorizing Provider  atorvastatin  (LIPITOR) 20 MG tablet Take 20 mg by mouth every evening. 08/13/20   [provider]  Cholecalciferol  (VITAMIN D -3) 5000 units TABS Take 5,000  Units by mouth daily.    [provider]  dofetilide  (TIKOSYN ) 500 MCG capsule TAKE 1 CAPSULE BY MOUTH TWICE A DAY 11/13/23   Croitoru, Mihai, MD  FREESTYLE PRECISION NEO TEST test strip USE AS DIRECTED TO TEST BLOOD SUGAR DAILY E11.21 09/06/19   [provider]  losartan (COZAAR) 50 MG tablet Take 50 mg by mouth daily. 12/09/19   [provider]  LUMIGAN 0.01 % SOLN Place 1 drop into both eyes at bedtime. 05/12/23   [provider]  Magnesium  Oxide 500 MG TABS Take 500 mg by mouth daily in the afternoon. 12/16/21   [provider]  metFORMIN (GLUCOPHAGE-XR) 500 MG 24 hr tablet Take 500 mg by mouth at bedtime.    [provider]  metoprolol  succinate (TOPROL -XL) 50 MG 24 hr tablet TAKE 1 TABLET BY MOUTH EVERY DAY 10/20/23   Croitoru, Mihai, MD  Pirfenidone  (ESBRIET ) 801 MG TABS Take 1 tablet (801 mg total) by mouth 3 (three) times daily with meals. 02/06/24   Mannam, Praveen, MD  prednisoLONE Acet-Moxifloxacin 1-0.5 % SUSP Place 1 drop into the right eye 4 (four) times daily. 05/22/23   [provider]  Treprostinil  (TYVASO  DPI MAINTENANCE KIT) 48 MCG POWD Inhale 48 mcg into the lungs in the morning, at noon, in the evening, and at bedtime. 01/05/24   Mannam, Praveen, MD  XARELTO  20 MG TABS tablet TAKE 1 TABLET BY MOUTH DAILY WITH SUPPER 12/18/23   Croitoru, Jerel, MD    Physical Exam: Vitals:   04/16/24 1855 04/16/24 1945 04/16/24 1947 04/16/24 2031  BP:   106/68 133/76  Pulse: 74 65  80  Resp: (!) 25 19 20  (!) 27  Temp:      TempSrc:      SpO2: 100% 100% 100% 100%  Weight:        Constitutional: NAD, calm  Eyes: PERTLA, lids and conjunctivae normal ENMT: Mucous membranes are moist. Posterior pharynx clear of any exudate or lesions.   Neck: supple, no masses  Respiratory: Speaking full sentences. No wheezing. No accessory muscle use.  Cardiovascular: S1 & S2 heard, regular rate and rhythm. Pretibial pitting edema.   Abdomen: No  tenderness, soft. Bowel sounds active.  Musculoskeletal: no clubbing / cyanosis. No joint deformity upper and lower extremities.   Skin: no significant rashes, lesions, ulcers. Warm, dry, well-perfused. Neurologic: CN 2-12 grossly intact. Moving all extremities. Alert and oriented.  Psychiatric: Pleasant. Cooperative.    Labs and Imaging on Admission: I have personally reviewed following labs and imaging studies  CBC: Recent Labs  Lab 04/16/24 1459  WBC 5.1  NEUTROABS 3.7  HGB 10.7*  HCT 34.9*  MCV 93.8  PLT 173   Basic Metabolic Panel: Recent Labs  Lab 04/16/24 1459  NA 130*  K 5.1  CL 96*  CO2 24  GLUCOSE 113*  BUN 23  CREATININE 1.01*  CALCIUM  8.7*   GFR: Estimated Creatinine Clearance: 45.7 mL/min (A) (by C-G formula based on SCr of 1.01 mg/dL (H)). Liver Function Tests: Recent Labs  Lab 04/16/24 1459  AST 32  ALT 20  ALKPHOS 71  BILITOT 0.8  PROT 6.6  ALBUMIN 2.9*   No results for input(s):  LIPASE, AMYLASE in the last 168 hours. No results for input(s): AMMONIA in the last 168 hours. Coagulation Profile: No results for input(s): INR, PROTIME in the last 168 hours. Cardiac Enzymes: No results for input(s): CKTOTAL, CKMB, CKMBINDEX, TROPONINI in the last 168 hours. BNP (last 3 results) Recent Labs    04/27/23 1648 11/21/23 1607  PROBNP 858* 323.0*   HbA1C: No results for input(s): HGBA1C in the last 72 hours. CBG: No results for input(s): GLUCAP in the last 168 hours. Lipid Profile: No results for input(s): CHOL, HDL, LDLCALC, TRIG, CHOLHDL, LDLDIRECT in the last 72 hours. Thyroid  Function Tests: No results for input(s): TSH, T4TOTAL, FREET4, T3FREE, THYROIDAB in the last 72 hours. Anemia Panel: No results for input(s): VITAMINB12, FOLATE, FERRITIN, TIBC, IRON, RETICCTPCT in the last 72 hours. Urine analysis:    Component Value Date/Time   COLORURINE STRAW (A) 08/05/2020 2318    APPEARANCEUR CLEAR 08/05/2020 2318   LABSPEC 1.006 08/05/2020 2318   PHURINE 6.0 08/05/2020 2318   GLUCOSEU NEGATIVE 08/05/2020 2318   HGBUR NEGATIVE 08/05/2020 2318   BILIRUBINUR NEGATIVE 08/05/2020 2318   KETONESUR NEGATIVE 08/05/2020 2318   PROTEINUR NEGATIVE 08/05/2020 2318   NITRITE NEGATIVE 08/05/2020 2318   LEUKOCYTESUR NEGATIVE 08/05/2020 2318   Sepsis Labs: @LABRCNTIP (procalcitonin:4,lacticidven:4) )No results found for this or any previous visit (from the past 240 hours).   Radiological Exams on Admission: DG Chest 2 View Result Date: 04/16/2024 CLINICAL DATA:  Short of breath. EXAM: CHEST - 2 VIEW COMPARISON:  08/05/2020.  High-resolution chest CT, 08/28/2023. FINDINGS: Cardiac silhouette is mildly enlarged, stable. No mediastinal or hilar masses. No convincing adenopathy. Bilateral interstitial thickening is stable. No lung consolidation or convincing edema. No pleural effusion or pneumothorax. Skeletal structures are demineralized, but intact. IMPRESSION: 1. No acute cardiopulmonary disease. 2. Chronic interstitial thickening consistent with interstitial lung disease. Stable cardiomegaly. Electronically Signed   By: Alm Parkins M.D.   On: 04/16/2024 15:29    EKG: Independently reviewed. Sinus rhythm, 1st degree AV block, LAD.   Assessment/Plan   1. Acute on chronic HFpEF  - Continue diuresis with IV Lasix , monitor weight and I/Os, monitor electrolytes and renal function, update echo    2. ILD; chronic hypoxic respiratory failure  - Continue supplemental O2, pirfenidone    3. Atrial fibrillation  - Continue Tikosyn , metoprolol , Xarelto     4. Hyponatremia  - Serum sodium is 130 in setting of hypervolemia  - Follow daily chem panel while diuresing    5. Type II DM  - Check CBGs, use low-intensity SSI for now     DVT prophylaxis: Xarelto   Code Status: Full  Level of Care: Level of care: Telemetry Cardiac Family Communication: Daughter at home  Disposition Plan:   Patient is from: Home  Anticipated d/c is to: Home  Anticipated d/c date is: Possibly as early as 7/2 or 7/3  Patient currently: Pending improved volume status, echocardiogram  Consults called: None  Admission status: Observation     Evalene GORMAN Sprinkles, MD [facility]  04/16/2024, 8:59 PM

## 2024-04-16 NOTE — Telephone Encounter (Signed)
 FYI Only or Action Required?: FYI only for provider.  Patient is followed in Pulmonology for Pulmonary Fibrosis, last seen on 02/19/2024 by Mannam, Praveen, MD. Called Nurse Triage reporting No chief complaint on file.. Symptoms began a week ago. Interventions attempted: Home oxygen  use. Symptoms are: rapidly worsening.  Triage Disposition: Go to ED or PCP/Alternative with Approval  Patient/caregiver understands and will follow disposition?: Yes  Copied from CRM [id number]. Topic: Clinical - Red Word Triage >> Apr 16, 2024 10:29 AM Leila C wrote: Red Word that prompted transfer to Nurse Triage: Patient [phone] wanted Dr. Theophilus to know she's going to call EMS to take patient hospital because both legs painful, tight and swollen off and on for a few days, cannot rest good, is on oxygen , patient keeps shortness of breath. Patient contact pcp and was advised patient to contact EMS to go to the hospital. Please advise.  Answer Assessment - Initial Assessment Questions 1. ONSET: When did the swelling start? (e.g., minutes, hours, days)     Couple of days  2. LOCATION: What part of the leg is swollen?  Are both legs swollen or just one leg?     Both legs, Thigh down  3. SEVERITY: How bad is the swelling? (e.g., localized; mild, moderate, severe)   - Localized: Small area of swelling localized to one leg.   - MILD pedal edema: Swelling limited to foot and ankle, pitting edema < 1/4 inch (6 mm) deep, rest and elevation eliminate most or all swelling.   - MODERATE edema: Swelling of lower leg to knee, pitting edema > 1/4 inch (6 mm) deep, rest and elevation only partially reduce swelling.   - SEVERE edema: Swelling extends above knee, facial or hand swelling present.      Severe  4. REDNESS: Does the swelling look red or infected?     No  5. PAIN: Is the swelling painful to touch? If Yes, ask: How painful is it?   (Scale 1-10; mild, moderate or severe)     Yes, Kinda sore,  nothing unbearable  6. FEVER: Do you have a fever? If Yes, ask: What is it, how was it measured, and when did it start?      No  7. CAUSE: What do you think is causing the leg swelling?     Unsure  8. MEDICAL HISTORY: Do you have a history of blood clots (e.g., DVT), cancer, heart failure, kidney disease, or liver failure?     Pulmonary Fibrosis, Heart Failure, Hypertension, Afib  9. RECURRENT SYMPTOM: Have you had leg swelling before? If Yes, ask: When was the last time? What happened that time?     Yes  10. OTHER SYMPTOMS: Do you have any other symptoms? (e.g., chest pain, difficulty breathing)       Dyspnea  11. PREGNANCY: Is there any chance you are pregnant? When was your last menstrual period?       No and No  Patient wanted to call and notify the Pulmonary Office that she will be going to [facility] via EMS on [address].  Protocols used: Leg Swelling and Edema-A-AH

## 2024-04-17 ENCOUNTER — Observation Stay (HOSPITAL_COMMUNITY)

## 2024-04-17 ENCOUNTER — Encounter (HOSPITAL_COMMUNITY): Payer: Self-pay | Admitting: Family Medicine

## 2024-04-17 DIAGNOSIS — I110 Hypertensive heart disease with heart failure: Secondary | ICD-10-CM | POA: Diagnosis present

## 2024-04-17 DIAGNOSIS — I2609 Other pulmonary embolism with acute cor pulmonale: Secondary | ICD-10-CM | POA: Diagnosis not present

## 2024-04-17 DIAGNOSIS — I440 Atrioventricular block, first degree: Secondary | ICD-10-CM | POA: Diagnosis present

## 2024-04-17 DIAGNOSIS — Z6841 Body Mass Index (BMI) 40.0 and over, adult: Secondary | ICD-10-CM | POA: Diagnosis not present

## 2024-04-17 DIAGNOSIS — Z7984 Long term (current) use of oral hypoglycemic drugs: Secondary | ICD-10-CM | POA: Diagnosis not present

## 2024-04-17 DIAGNOSIS — E66813 Obesity, class 3: Secondary | ICD-10-CM | POA: Diagnosis present

## 2024-04-17 DIAGNOSIS — I50810 Right heart failure, unspecified: Secondary | ICD-10-CM | POA: Diagnosis not present

## 2024-04-17 DIAGNOSIS — N179 Acute kidney failure, unspecified: Secondary | ICD-10-CM | POA: Diagnosis not present

## 2024-04-17 DIAGNOSIS — I272 Pulmonary hypertension, unspecified: Secondary | ICD-10-CM | POA: Diagnosis not present

## 2024-04-17 DIAGNOSIS — J8410 Pulmonary fibrosis, unspecified: Secondary | ICD-10-CM | POA: Diagnosis present

## 2024-04-17 DIAGNOSIS — I5031 Acute diastolic (congestive) heart failure: Secondary | ICD-10-CM | POA: Diagnosis not present

## 2024-04-17 DIAGNOSIS — Z9981 Dependence on supplemental oxygen: Secondary | ICD-10-CM | POA: Diagnosis not present

## 2024-04-17 DIAGNOSIS — R1310 Dysphagia, unspecified: Secondary | ICD-10-CM | POA: Diagnosis not present

## 2024-04-17 DIAGNOSIS — I959 Hypotension, unspecified: Secondary | ICD-10-CM | POA: Diagnosis not present

## 2024-04-17 DIAGNOSIS — Z79899 Other long term (current) drug therapy: Secondary | ICD-10-CM | POA: Diagnosis not present

## 2024-04-17 DIAGNOSIS — I5033 Acute on chronic diastolic (congestive) heart failure: Secondary | ICD-10-CM | POA: Diagnosis present

## 2024-04-17 DIAGNOSIS — I2721 Secondary pulmonary arterial hypertension: Secondary | ICD-10-CM | POA: Diagnosis present

## 2024-04-17 DIAGNOSIS — Z7901 Long term (current) use of anticoagulants: Secondary | ICD-10-CM | POA: Diagnosis not present

## 2024-04-17 DIAGNOSIS — E119 Type 2 diabetes mellitus without complications: Secondary | ICD-10-CM | POA: Diagnosis present

## 2024-04-17 DIAGNOSIS — Z8249 Family history of ischemic heart disease and other diseases of the circulatory system: Secondary | ICD-10-CM | POA: Diagnosis not present

## 2024-04-17 DIAGNOSIS — E785 Hyperlipidemia, unspecified: Secondary | ICD-10-CM | POA: Diagnosis present

## 2024-04-17 DIAGNOSIS — I480 Paroxysmal atrial fibrillation: Secondary | ICD-10-CM | POA: Diagnosis not present

## 2024-04-17 DIAGNOSIS — E871 Hypo-osmolality and hyponatremia: Secondary | ICD-10-CM | POA: Diagnosis present

## 2024-04-17 DIAGNOSIS — R0602 Shortness of breath: Secondary | ICD-10-CM | POA: Diagnosis present

## 2024-04-17 DIAGNOSIS — Z888 Allergy status to other drugs, medicaments and biological substances status: Secondary | ICD-10-CM | POA: Diagnosis not present

## 2024-04-17 DIAGNOSIS — Z5181 Encounter for therapeutic drug level monitoring: Secondary | ICD-10-CM | POA: Diagnosis not present

## 2024-04-17 DIAGNOSIS — J9611 Chronic respiratory failure with hypoxia: Secondary | ICD-10-CM | POA: Diagnosis present

## 2024-04-17 DIAGNOSIS — I4819 Other persistent atrial fibrillation: Secondary | ICD-10-CM | POA: Diagnosis present

## 2024-04-17 LAB — BASIC METABOLIC PANEL WITH GFR
Anion gap: 10 (ref 5–15)
BUN: 29 mg/dL — ABNORMAL HIGH (ref 8–23)
CO2: 26 mmol/L (ref 22–32)
Calcium: 8.7 mg/dL — ABNORMAL LOW (ref 8.9–10.3)
Chloride: 97 mmol/L — ABNORMAL LOW (ref 98–111)
Creatinine, Ser: 1.06 mg/dL — ABNORMAL HIGH (ref 0.44–1.00)
GFR, Estimated: 53 mL/min — ABNORMAL LOW (ref >60)
Glucose, Bld: 167 mg/dL — ABNORMAL HIGH (ref 70–99)
Potassium: 4.8 mmol/L (ref 3.5–5.1)
Sodium: 133 mmol/L — ABNORMAL LOW (ref 135–145)

## 2024-04-17 LAB — GLUCOSE, CAPILLARY
Glucose-Capillary: 186 mg/dL — ABNORMAL HIGH (ref 70–99)
Glucose-Capillary: 143 mg/dL — ABNORMAL HIGH (ref 70–99)
Glucose-Capillary: 131 mg/dL — ABNORMAL HIGH (ref 70–99)
Glucose-Capillary: 128 mg/dL — ABNORMAL HIGH (ref 70–99)

## 2024-04-17 LAB — ECHOCARDIOGRAM COMPLETE
AR max vel: 2.19 cm2
AV Area VTI: 2.22 cm2
AV Area mean vel: 2.13 cm2
AV Mean grad: 4 mmHg
AV Peak grad: 7.4 mmHg
Ao pk vel: 1.36 m/s
Area-P 1/2: 2.48 cm2
Height: 60 in
S' Lateral: 2.8 cm
Weight: 3344 [oz_av]

## 2024-04-17 LAB — CBC
HCT: 32.4 % — ABNORMAL LOW (ref 36.0–46.0)
Hemoglobin: 10.1 g/dL — ABNORMAL LOW (ref 12.0–15.0)
MCH: 28.9 pg (ref 26.0–34.0)
MCHC: 31.2 g/dL (ref 30.0–36.0)
MCV: 92.6 fL (ref 80.0–100.0)
Platelets: 180 10*3/uL (ref 150–400)
RBC: 3.5 MIL/uL — ABNORMAL LOW (ref 3.87–5.11)
RDW: 15.5 % (ref 11.5–15.5)
WBC: 5.7 10*3/uL (ref 4.0–10.5)
nRBC: 0 % (ref 0.0–0.2)

## 2024-04-17 LAB — MAGNESIUM: Magnesium: 1.9 mg/dL (ref 1.7–2.4)

## 2024-04-17 LAB — HEMOGLOBIN A1C
Hgb A1c MFr Bld: 7.3 % — ABNORMAL HIGH (ref 4.8–5.6)
Mean Plasma Glucose: 162.81 mg/dL

## 2024-04-17 NOTE — TOC CM/SW Note (Addendum)
 Transition of Care [facility]) - Inpatient Brief Assessment   Patient Details  Name: Carrie Haynes MRN: [id number] Date of Birth: [birth year]  Transition of Care [facility] CM/SW Contact:    Waddell Barnie Rama, RN Phone Number: 04/17/2024, 4:27 PM   Clinical Narrative: From home with spouse, has PCP and insurance on file, states has no HH services in place at this time, has home oxygen  4 liters with adapt and a rollator at home.  States family member will transport them home at [employer] and family is support system, states gets medications from [facility] on [address].  Pta self ambulatory with rollator.  Patient gives this NCM permission to speak with spouse.  She knows the importance of eating a low sodium diet and she has a scale for daily weights. Await pt eval.   Transition of Care Asessment: Insurance and Status: Insurance coverage has been reviewed Patient has primary care physician: Yes Home environment has been reviewed: home wiht spouse Prior level of function:: indep Prior/Current Home Services: Current home services (home oxygen  with adapt 4 liters, rollator) Social Drivers of Health Review: SDOH reviewed no interventions necessary Readmission risk has been reviewed: Yes Transition of care needs: transition of care needs identified, TOC will continue to follow

## 2024-04-17 NOTE — Plan of Care (Signed)
 Patient remains short of breath with minimal movement. Patient on 02 at 4l as a baseline. Have increased up to 6 liters for episodes of desaturation. Then titrated back to 4l.

## 2024-04-17 NOTE — Progress Notes (Signed)
 New patient admitted for the ed to room 29. Patient with increased work to breath and decreased 02 sat. Patient is on 4l [place]. Patient is sob at rest coughing and desats with exertion. Patient 02 sat decreased to 87 increased 02 for a few minutes took patient several minutes to recover and titrate 02 back down to 4l/Carrie Haynes. No wounds noted. Patient may have some yeast starting in perineal folds no redness or bri=oken skin noted. Both lower extremities red and hard and edematous. Both bottoms of feet dry and scaley. No wounds noted. Patient oriented to room bed and call bell and belongings policy.

## 2024-04-17 NOTE — Progress Notes (Signed)
 PROGRESS NOTE    Carrie Haynes  FMW:[id number] DOB: 08/07/1941 DOA: 04/16/2024 PCP: Teresa Channel, MD    Brief Narrative:   Carrie Haynes is a 83 y.o. female with past medical history significant for chronic hypoxic respiratory failure/interstitial lung disease on 4 L nasal cannula at baseline, DM2, HTN, paroxysmal atrial fibrillation on Xarelto , pulmonary HTN who presented to [facility] ED on 04/16/2024 with progressive shortness of breath, lower extremity edema.  Additionally reports decreased activity tolerance.  Cough mainly nonproductive.  Denies fever, no chest pain.  In the ED, temperature 98.1 F, HR 67, RR 18, BP 130/104, SpO2 100% on 4 L nasal cannula.  WBC 5.1, hemoglobin 10.7, platelet count 173.  Sodium 130, potassium 5.1, chloride 96, CO2 24, glucose 113, BUN 23, creat 1.01.  AST 32, ALT 20, 2 bilirubin 0.8.  BNP 532.1.  High sensitive troponin 11> 12.  Chest x-ray with no acute cardiopulmonary disease process.  Chronic interstitial thickening consistent with interstitial lung disease, stable cardiomegaly.  EKG with NSR, first-degree AV block, rate 70, QTc 442.  Patient was given furosemide  80 mg IV x 1 in the ED.  TRH consulted for admission for further evaluation management of diastolic CHF exacerbation.  Assessment & Plan:   Acute on chronic diastolic congestive heart failure Patient presenting with progressive dyspnea, lower extremity edema.  BNP elevated 532.1 with 2+ lower extremity edema.  Previous TTE 05/18/2023 with LVEF 55-60%, LV with no regional wall motion abnormalities, grade 1 diastolic dysfunction, RV mildly enlarged, RA mildly dilated, no aortic stenosis, IVC normal in size.  Follows with cardiology, Dr. Bette outpatient.  -- Furosemide  40 mg IV every 12 hours -- Strict I's and O's and daily weights -- BMP daily, repeat BMP in a.m.  Hyponatremia Sodium 130 on admission, likely secondary to hypervolemic hyponatremia in the setting of volume overload. -- Na  130>133 -- Continue IV diuresis as above -- BMP daily  Chronic hypoxic respiratory failure/interstitial lung disease, oxygen  dependent Follows with pulmonology outpatient, Dr. Theophilus. Dependent on 4 L nasal cannula at baseline. -- Continue supplemental oxygen , currently on 4 L nasal cannula which is at her baseline  DM2 On metformin 500 mg p.o. daily at baseline. -- Hold oral hypoglycemics while inpatient -- Very sensitive SSI for coverage -- CBG before every meal/at bedtime  Paroxysmal atrial fibrillation on anticoagulation HTN -- Tikosyn  500 mcg p.o. twice daily --Metoprolol  succinate 50 mg p.o. daily -- Losartan 50 mg p.o. daily -- Xarelto  20 mg p.o. daily  Pulmonary hypertension -- Pifenidone 801mg  PO TID  HLD -- Atorvastatin  20 g p.o. daily  Weakness/debility/deconditioning/gait disturbance: Patient currently resides at home, utilizes a rollator for ambulation. --PT/OT evaluation   DVT prophylaxis:  rivaroxaban  (XARELTO ) tablet 20 mg    Code Status: Full Code Family Communication: Updated family present at bedside this morning  Disposition Plan:  Level of care: Telemetry Cardiac Status is: Inpatient Remains inpatient appropriate because: IV diuresis    Consultants:  None  Procedures:  TTE: Pending  Antimicrobials:  None   Subjective: Patient seen examined bedside, lying in bed.  Family present at bedside.  Reports dyspnea improved with good urine output with IV Lasix .  Continues with mild dyspnea, maintaining on her home oxygen  of 4 L per nasal cannula.  Continues with lower extremity edema.  Patient reports she ambulates at home with the use of a rollator, but now going decreased distances.  Discussed obtaining therapy evaluation tomorrow, agreeable.  Patient with no other [place] complaints or  concerns at this time.  Denies headache, no dizziness, no chest pain, no palpitations, no fever/chills/night sweats, no nausea/vomiting/diarrhea, no focal weakness,  no fatigue, no paresthesias. No acute events overnight per nursing staff.  Objective: Vitals:   04/17/24 0600 04/17/24 0700 04/17/24 0830 04/17/24 1124  BP: 134/73   (!) 118/54  Pulse: 81 82  88  Resp: (!) 22 (!) 23  (!) 24  Temp:    97.8 F (36.6 C)  TempSrc:   Oral Oral  SpO2: (!) 89% 96%  90%  Weight:      Height:   5' (1.524 m)     Intake/Output Summary (Last 24 hours) at 04/17/2024 1532 Last data filed at 04/17/2024 1320 Gross per 24 hour  Intake 120 ml  Output 725 ml  Net -605 ml   Filed Weights   04/16/24 1425  Weight: 94.8 kg    Examination:  Physical Exam: GEN: NAD, alert and oriented x 3, chronically ill appearance HEENT: NCAT, PERRL, EOMI, sclera clear, MMM PULM: Breath sounds slight diminished bilateral bases with crackles, no wheezes, normal respiratory effort without accessory muscle use, on 4 L nasal cannula with SpO2 90% which is her baseline CV: RRR w/o M/G/R GI: abd soft, NTND, NABS, no R/G/M MSK: 1-2+ bilateral lower extremity edema to mid shin with chronic venous changes, moves all extremity independently with preserved muscle strength NEURO: CN II-XII intact, no focal deficits, sensation to light touch intact PSYCH: normal mood/affect Integumentary: Chronic venous changes bilateral lower extremities, otherwise no other concerning rashes/lesions/wounds noted on exposed skin surfaces    Data Reviewed: I have personally reviewed following labs and imaging studies  CBC: Recent Labs  Lab 04/16/24 1459 04/17/24 0530  WBC 5.1 5.7  NEUTROABS 3.7  --   HGB 10.7* 10.1*  HCT 34.9* 32.4*  MCV 93.8 92.6  PLT 173 180   Basic Metabolic Panel: Recent Labs  Lab 04/16/24 1459 04/17/24 0530  NA 130* 133*  K 5.1 4.8  CL 96* 97*  CO2 24 26  GLUCOSE 113* 167*  BUN 23 29*  CREATININE 1.01* 1.06*  CALCIUM  8.7* 8.7*  MG  --  1.9   GFR: Estimated Creatinine Clearance: 43.6 mL/min (A) (by C-G formula based on SCr of 1.06 mg/dL (H)). Liver Function  Tests: Recent Labs  Lab 04/16/24 1459  AST 32  ALT 20  ALKPHOS 71  BILITOT 0.8  PROT 6.6  ALBUMIN 2.9*   No results for input(s): LIPASE, AMYLASE in the last 168 hours. No results for input(s): AMMONIA in the last 168 hours. Coagulation Profile: No results for input(s): INR, PROTIME in the last 168 hours. Cardiac Enzymes: No results for input(s): CKTOTAL, CKMB, CKMBINDEX, TROPONINI in the last 168 hours. BNP (last 3 results) Recent Labs    04/27/23 1648 11/21/23 1607  PROBNP 858* 323.0*   HbA1C: Recent Labs    04/17/24 0530  HGBA1C 7.3*   CBG: Recent Labs  Lab 04/16/24 2116 04/17/24 0859 04/17/24 1122  GLUCAP 95 186* 143*   Lipid Profile: No results for input(s): CHOL, HDL, LDLCALC, TRIG, CHOLHDL, LDLDIRECT in the last 72 hours. Thyroid  Function Tests: No results for input(s): TSH, T4TOTAL, FREET4, T3FREE, THYROIDAB in the last 72 hours. Anemia Panel: No results for input(s): VITAMINB12, FOLATE, FERRITIN, TIBC, IRON, RETICCTPCT in the last 72 hours. Sepsis Labs: No results for input(s): PROCALCITON, LATICACIDVEN in the last 168 hours.  No results found for this or any previous visit (from the past 240 hours).  Radiology Studies: DG Chest 2 View Result Date: 04/16/2024 CLINICAL DATA:  Short of breath. EXAM: CHEST - 2 VIEW COMPARISON:  08/05/2020.  High-resolution chest CT, 08/28/2023. FINDINGS: Cardiac silhouette is mildly enlarged, stable. No mediastinal or hilar masses. No convincing adenopathy. Bilateral interstitial thickening is stable. No lung consolidation or convincing edema. No pleural effusion or pneumothorax. Skeletal structures are demineralized, but intact. IMPRESSION: 1. No acute cardiopulmonary disease. 2. Chronic interstitial thickening consistent with interstitial lung disease. Stable cardiomegaly. Electronically Signed   By: Alm Parkins M.D.   On: 04/16/2024 15:29         Scheduled Meds:  atorvastatin   20 mg Oral QPM   dofetilide   500 mcg Oral BID   furosemide   40 mg Intravenous Q12H   insulin  aspart  0-5 Units Subcutaneous QHS   insulin  aspart  0-6 Units Subcutaneous TID WC   losartan  50 mg Oral Daily   metoprolol  succinate  50 mg Oral Daily   Pirfenidone   801 mg Oral TID WC   rivaroxaban   20 mg Oral Q supper   sodium chloride  flush  3 mL Intravenous Q12H   Continuous Infusions:   LOS: 0 days    Time spent: 50 minutes spent on 04/17/2024 caring for this patient face-to-face including chart review, ordering labs/tests, documenting, discussion with nursing staff, consultants, updating family and interview/physical exam    Camellia PARAS [place], DO [facility] Available via Epic secure chat 7am-7pm After these hours, please refer to coverage provider listed on [email or web address] 04/17/2024, 3:32 PM

## 2024-04-18 DIAGNOSIS — I50810 Right heart failure, unspecified: Secondary | ICD-10-CM

## 2024-04-18 DIAGNOSIS — I272 Pulmonary hypertension, unspecified: Secondary | ICD-10-CM | POA: Diagnosis not present

## 2024-04-18 DIAGNOSIS — I5033 Acute on chronic diastolic (congestive) heart failure: Secondary | ICD-10-CM | POA: Diagnosis not present

## 2024-04-18 DIAGNOSIS — I4819 Other persistent atrial fibrillation: Secondary | ICD-10-CM | POA: Diagnosis not present

## 2024-04-18 LAB — GLUCOSE, CAPILLARY
Glucose-Capillary: 131 mg/dL — ABNORMAL HIGH (ref 70–99)
Glucose-Capillary: 199 mg/dL — ABNORMAL HIGH (ref 70–99)
Glucose-Capillary: 152 mg/dL — ABNORMAL HIGH (ref 70–99)
Glucose-Capillary: 115 mg/dL — ABNORMAL HIGH (ref 70–99)

## 2024-04-18 LAB — BASIC METABOLIC PANEL WITH GFR
Anion gap: 9 (ref 5–15)
BUN: 25 mg/dL — ABNORMAL HIGH (ref 8–23)
CO2: 29 mmol/L (ref 22–32)
Calcium: 8.5 mg/dL — ABNORMAL LOW (ref 8.9–10.3)
Chloride: 92 mmol/L — ABNORMAL LOW (ref 98–111)
Creatinine, Ser: 1.18 mg/dL — ABNORMAL HIGH (ref 0.44–1.00)
GFR, Estimated: 47 mL/min — ABNORMAL LOW (ref >60)
Glucose, Bld: 143 mg/dL — ABNORMAL HIGH (ref 70–99)
Potassium: 4.3 mmol/L (ref 3.5–5.1)
Sodium: 130 mmol/L — ABNORMAL LOW (ref 135–145)

## 2024-04-18 LAB — BRAIN NATRIURETIC PEPTIDE: B Natriuretic Peptide: 466.1 pg/mL — ABNORMAL HIGH (ref 0.0–100.0)

## 2024-04-18 LAB — MAGNESIUM: Magnesium: 1.6 mg/dL — ABNORMAL LOW (ref 1.7–2.4)

## 2024-04-18 MED ORDER — MAGNESIUM SULFATE 2 GM/50ML IV SOLN
2.0000 g | Freq: Once | INTRAVENOUS | Status: AC
  Administered 2024-04-18: 2 g via INTRAVENOUS
  Filled 2024-04-18: qty 50

## 2024-04-18 NOTE — Plan of Care (Signed)

## 2024-04-18 NOTE — Progress Notes (Addendum)
 PROGRESS NOTE    Carrie Haynes  FMW:[id number] DOB: 06-19-1941 DOA: 04/16/2024 PCP: Teresa Channel, MD    Brief Narrative:   Carrie Haynes is a 83 y.o. female with past medical history significant for chronic hypoxic respiratory failure/interstitial lung disease on 4 L nasal cannula at baseline, DM2, HTN, paroxysmal atrial fibrillation on Xarelto , pulmonary HTN who presented to [facility] ED on 04/16/2024 with progressive shortness of breath, lower extremity edema.  Additionally reports decreased activity tolerance.  Cough mainly nonproductive.  Denies fever, no chest pain.  In the ED, temperature 98.1 F, HR 67, RR 18, BP 130/104, SpO2 100% on 4 L nasal cannula.  WBC 5.1, hemoglobin 10.7, platelet count 173.  Sodium 130, potassium 5.1, chloride 96, CO2 24, glucose 113, BUN 23, creat 1.01.  AST 32, ALT 20, 2 bilirubin 0.8.  BNP 532.1.  High sensitive troponin 11> 12.  Chest x-ray with no acute cardiopulmonary disease process.  Chronic interstitial thickening consistent with interstitial lung disease, stable cardiomegaly.  EKG with NSR, first-degree AV block, rate 70, QTc 442.  Patient was given furosemide  80 mg IV x 1 in the ED.  TRH consulted for admission for further evaluation management of diastolic CHF exacerbation.  Assessment & Plan:   Acute on chronic diastolic congestive heart failure Patient presenting with progressive dyspnea, lower extremity edema.  BNP elevated 532.1 with 2+ lower extremity edema.  Previous TTE 05/18/2023 with LVEF 55-60%, LV with no regional wall motion abnormalities, grade 1 diastolic dysfunction, RV mildly enlarged, RA mildly dilated, no aortic stenosis, IVC normal in size.  Follows with cardiology, Dr. Bette outpatient.  Repeat TTE 7/2 with LVEF 55 to 60%, no LV regional wall motion abnormalities, grade 1 diastolic dysfunction, IVC dilated. -- Cardiology consulted -- net negative 4.1L past 24h and net negative 4.8L since admission -- Furosemide  40 mg IV every 12  hours -- Strict I's and O's and daily weights -- BMP daily  Hyponatremia Sodium 130 on admission, likely secondary to hypervolemic hyponatremia in the setting of volume overload. -- Na 130>133>130 -- Continue IV diuresis as above -- BMP daily  Hypomagnesemia Magnesium  1.6, will replete. -- Repeat electrolytes in the a.m.  Chronic hypoxic respiratory failure/interstitial lung disease, oxygen  dependent Follows with pulmonology outpatient, Dr. Theophilus. Dependent on 4 L nasal cannula at baseline. -- Continue supplemental oxygen , currently on 5 L nasal cannula   DM2 On metformin 500 mg p.o. daily at baseline. -- Hold oral hypoglycemics while inpatient -- Very sensitive SSI for coverage -- CBG before every meal/at bedtime  Paroxysmal atrial fibrillation on anticoagulation HTN -- Tikosyn  500 mcg p.o. twice daily -- Metoprolol  succinate 50 mg p.o. daily -- Losartan 50 mg p.o. daily -- Xarelto  20 mg p.o. daily  Pulmonary hypertension -- Pifenidone 801mg  PO TID  HLD -- Atorvastatin  20 g p.o. daily  Weakness/debility/deconditioning/gait disturbance: Patient currently resides at home, utilizes a rollator for ambulation. -- PT recommending home health -- OT evaluation: Pending   DVT prophylaxis:  rivaroxaban  (XARELTO ) tablet 20 mg    Code Status: Full Code Family Communication: Updated family present at bedside this morning  Disposition Plan:  Level of care: Telemetry Cardiac Status is: Inpatient Remains inpatient appropriate because: IV diuresis    Consultants:  Cardiology  Procedures:  TTE:   Antimicrobials:  None   Subjective: Patient seen examined bedside, lying in bed.  No family present at bedside.  Sitting in bedside chair.  Reports dyspnea improved.  Given patient currently on Tikosyn  with decompensated CHF,  will consult cardiology for assistance in management.  Continues with lower extremity edema, but reports this is improved as well.  PT with  recommendation of home health.  Awaiting OT evaluation.  Patient with no other complaints or concerns at this time.  Denies headache, no dizziness, no chest pain, no palpitations, no fever/chills/night sweats, no nausea/vomiting/diarrhea, no focal weakness, no fatigue, no paresthesias. No acute events overnight per nursing staff.  Objective: Vitals:   04/18/24 0011 04/18/24 0506 04/18/24 0622 04/18/24 0714  BP: (!) 98/53 (!) 102/50 101/63 101/60  Pulse: 73 78 77 78  Resp: 20 20 (!) 23 20  Temp: 99 F (37.2 C) 98.1 F (36.7 C)  98.6 F (37 C)  TempSrc: Axillary Oral  Oral  SpO2: 95% 93%  92%  Weight:  96.9 kg    Height:        Intake/Output Summary (Last 24 hours) at 04/18/2024 1111 Last data filed at 04/18/2024 0819 Gross per 24 hour  Intake 530 ml  Output 4600 ml  Net -4070 ml   Filed Weights   04/16/24 1425 04/18/24 0506  Weight: 94.8 kg 96.9 kg    Examination:  Physical Exam: GEN: NAD, alert and oriented x 3, chronically ill appearance HEENT: NCAT, PERRL, EOMI, sclera clear, MMM PULM: Breath sounds slight diminished bilateral bases with crackles, no wheezes, normal respiratory effort without accessory muscle use, on 5 L nasal cannula with SpO2 92% (baseline 4L [place]) CV: RRR w/o M/G/R GI: abd soft, NTND, NABS, no R/G/M MSK: 1-2+ bilateral lower extremity edema to mid shin with chronic venous changes, moves all extremity independently with preserved muscle strength NEURO: CN II-XII intact, no focal deficits, sensation to light touch intact PSYCH: normal mood/affect Integumentary: Chronic venous changes bilateral lower extremities, otherwise no other concerning rashes/lesions/wounds noted on exposed skin surfaces    Data Reviewed: I have personally reviewed following labs and imaging studies  CBC: Recent Labs  Lab 04/16/24 1459 04/17/24 0530  WBC 5.1 5.7  NEUTROABS 3.7  --   HGB 10.7* 10.1*  HCT 34.9* 32.4*  MCV 93.8 92.6  PLT 173 180   Basic Metabolic  Panel: Recent Labs  Lab 04/16/24 1459 04/17/24 0530 04/18/24 0228  NA 130* 133* 130*  K 5.1 4.8 4.3  CL 96* 97* 92*  CO2 24 26 29   GLUCOSE 113* 167* 143*  BUN 23 29* 25*  CREATININE 1.01* 1.06* 1.18*  CALCIUM  8.7* 8.7* 8.5*  MG  --  1.9 1.6*   GFR: Estimated Creatinine Clearance: 39.7 mL/min (A) (by C-G formula based on SCr of 1.18 mg/dL (H)). Liver Function Tests: Recent Labs  Lab 04/16/24 1459  AST 32  ALT 20  ALKPHOS 71  BILITOT 0.8  PROT 6.6  ALBUMIN 2.9*   No results for input(s): LIPASE, AMYLASE in the last 168 hours. No results for input(s): AMMONIA in the last 168 hours. Coagulation Profile: No results for input(s): INR, PROTIME in the last 168 hours. Cardiac Enzymes: No results for input(s): CKTOTAL, CKMB, CKMBINDEX, TROPONINI in the last 168 hours. BNP (last 3 results) Recent Labs    04/27/23 1648 11/21/23 1607  PROBNP 858* 323.0*   HbA1C: Recent Labs    04/17/24 0530  HGBA1C 7.3*   CBG: Recent Labs  Lab 04/17/24 0859 04/17/24 1122 04/17/24 1628 04/17/24 2117 04/18/24 0617  GLUCAP 186* 143* 131* 128* 131*   Lipid Profile: No results for input(s): CHOL, HDL, LDLCALC, TRIG, CHOLHDL, LDLDIRECT in the last 72 hours. Thyroid  Function Tests: No results  for input(s): TSH, T4TOTAL, FREET4, T3FREE, THYROIDAB in the last 72 hours. Anemia Panel: No results for input(s): VITAMINB12, FOLATE, FERRITIN, TIBC, IRON, RETICCTPCT in the last 72 hours. Sepsis Labs: No results for input(s): PROCALCITON, LATICACIDVEN in the last 168 hours.  No results found for this or any previous visit (from the past 240 hours).       Radiology Studies: ECHOCARDIOGRAM COMPLETE Result Date: 04/17/2024    ECHOCARDIOGRAM REPORT   Patient Name:   RHIANNE SOMAN Date of Exam: 04/17/2024 Medical Rec #:  [id number]        Height:       60.0 in Accession #:    [id number]       Weight:       209.0 lb Date of Birth:   October 23, 1940       BSA:          1.902 m Patient Age:    82 years         BP:           124/51 mmHg Patient Gender: F                HR:           77 bpm. Exam Location:  Inpatient Procedure: 2D Echo, Cardiac Doppler and Color Doppler (Both Spectral and Color            Flow Doppler were utilized during procedure). Indications:    CHF-Acute Diastolic I50.31  History:        Patient has prior history of Echocardiogram examinations, most                 recent 05/18/2023. Arrythmias:Atrial Fibrillation; Risk                 Factors:Hypertension and Diabetes.  Sonographer:    Jayson Gaskins Referring Phys: [id number] TIMOTHY S OPYD IMPRESSIONS  1. Left ventricular ejection fraction, by estimation, is 55 to 60%. The left ventricle has normal function. The left ventricle has no regional wall motion abnormalities. Left ventricular diastolic parameters are consistent with Grade I diastolic dysfunction (impaired relaxation).  2. D-shaped ventricular septum suggestive of RV pressure/volume overload. Right ventricular systolic function is moderately reduced. The right ventricular size is severely enlarged. There is severely elevated pulmonary artery systolic pressure. The estimated right ventricular systolic pressure is 92.8 mmHg.  3. Right atrial size was moderately dilated.  4. The mitral valve is normal in structure. Trivial mitral valve regurgitation. No evidence of mitral stenosis. Moderate mitral annular calcification.  5. The tricuspid valve is abnormal. Tricuspid valve regurgitation is severe. Systolic flow reversal in the hepatic vein doppler pattern.  6. The aortic valve is tricuspid. There is moderate calcification of the aortic valve. Aortic valve regurgitation is not visualized. Aortic valve sclerosis/calcification is present, without any evidence of aortic stenosis.  7. The inferior vena cava is dilated in size with <50% respiratory variability, suggesting right atrial pressure of 15 mmHg. FINDINGS  Left Ventricle: Left  ventricular ejection fraction, by estimation, is 55 to 60%. The left ventricle has normal function. The left ventricle has no regional wall motion abnormalities. The left ventricular internal cavity size was normal in size. There is  no left ventricular hypertrophy. Left ventricular diastolic parameters are consistent with Grade I diastolic dysfunction (impaired relaxation). Right Ventricle: D-shaped ventricular septum suggestive of RV pressure/volume overload. The right ventricular size is severely enlarged. No increase in right ventricular wall thickness. Right ventricular systolic function is moderately reduced.  There is severely elevated pulmonary artery systolic pressure. The tricuspid regurgitant velocity is 4.41 m/s, and with an assumed right atrial pressure of 15 mmHg, the estimated right ventricular systolic pressure is 92.8 mmHg. Left Atrium: Left atrial size was normal in size. Right Atrium: Right atrial size was moderately dilated. Pericardium: Trivial pericardial effusion is present. Mitral Valve: The mitral valve is normal in structure. Moderate mitral annular calcification. Trivial mitral valve regurgitation. No evidence of mitral valve stenosis. Tricuspid Valve: The tricuspid valve is abnormal. Tricuspid valve regurgitation is severe. Aortic Valve: The aortic valve is tricuspid. There is moderate calcification of the aortic valve. Aortic valve regurgitation is not visualized. Aortic valve sclerosis/calcification is present, without any evidence of aortic stenosis. Aortic valve mean gradient measures 4.0 mmHg. Aortic valve peak gradient measures 7.4 mmHg. Aortic valve area, by VTI measures 2.22 cm. Pulmonic Valve: The pulmonic valve was normal in structure. Pulmonic valve regurgitation is not visualized. Aorta: The aortic root is normal in size and structure. Venous: The inferior vena cava is dilated in size with less than 50% respiratory variability, suggesting right atrial pressure of 15 mmHg.  IAS/Shunts: No atrial level shunt detected by color flow Doppler.  LEFT VENTRICLE PLAX 2D LVIDd:         4.40 cm   Diastology LVIDs:         2.80 cm   LV e' medial:    10.40 cm/s LV PW:         1.10 cm   LV E/e' medial:  4.7 LV IVS:        0.90 cm   LV e' lateral:   8.81 cm/s LVOT diam:     1.80 cm   LV E/e' lateral: 5.5 LV SV:         51 LV SV Index:   27 LVOT Area:     2.54 cm  RIGHT VENTRICLE             IVC RV Basal diam:  4.80 cm     IVC diam: 2.80 cm RV Mid diam:    4.80 cm RV S prime:     17.50 cm/s TAPSE (M-mode): 2.2 cm LEFT ATRIUM             Index        RIGHT ATRIUM           Index LA Vol (A2C):   52.2 ml 27.45 ml/m  RA Area:     29.60 cm LA Vol (A4C):   28.1 ml 14.78 ml/m  RA Volume:   112.00 ml 58.89 ml/m LA Biplane Vol: 39.6 ml 20.82 ml/m  AORTIC VALVE AV Area (Vmax):    2.19 cm AV Area (Vmean):   2.13 cm AV Area (VTI):     2.22 cm AV Vmax:           136.00 cm/s AV Vmean:          97.800 cm/s AV VTI:            0.229 m AV Peak Grad:      7.4 mmHg AV Mean Grad:      4.0 mmHg LVOT Vmax:         117.00 cm/s LVOT Vmean:        81.700 cm/s LVOT VTI:          0.200 m LVOT/AV VTI ratio: 0.87  AORTA Ao Root diam: 2.70 cm MITRAL VALVE  TRICUSPID VALVE MV Area (PHT): 2.48 cm    TR Peak grad:   77.8 mmHg MV Decel Time: 306 msec    TR Vmax:        441.00 cm/s MV E velocity: 48.70 cm/s MV A velocity: 77.60 cm/s  SHUNTS MV E/A ratio:  0.63        Systemic VTI:  0.20 m                            Systemic Diam: 1.80 cm Dalton McleanMD Electronically signed by Ezra Kanner Signature Date/Time: 04/17/2024/4:09:11 PM    Final    DG Chest 2 View Result Date: 04/16/2024 CLINICAL DATA:  Short of breath. EXAM: CHEST - 2 VIEW COMPARISON:  08/05/2020.  High-resolution chest CT, 08/28/2023. FINDINGS: Cardiac silhouette is mildly enlarged, stable. No mediastinal or hilar masses. No convincing adenopathy. Bilateral interstitial thickening is stable. No lung consolidation or convincing edema. No pleural  effusion or pneumothorax. Skeletal structures are demineralized, but intact. IMPRESSION: 1. No acute cardiopulmonary disease. 2. Chronic interstitial thickening consistent with interstitial lung disease. Stable cardiomegaly. Electronically Signed   By: Alm Parkins M.D.   On: 04/16/2024 15:29        Scheduled Meds:  atorvastatin   20 mg Oral QPM   dofetilide   500 mcg Oral BID   furosemide   40 mg Intravenous Q12H   insulin  aspart  0-5 Units Subcutaneous QHS   insulin  aspart  0-6 Units Subcutaneous TID WC   losartan  50 mg Oral Daily   metoprolol  succinate  50 mg Oral Daily   Pirfenidone   801 mg Oral TID WC   rivaroxaban   20 mg Oral Q supper   sodium chloride  flush  3 mL Intravenous Q12H   Continuous Infusions:   LOS: 1 day    Time spent: 50 minutes spent on 04/18/2024 caring for this patient face-to-face including chart review, ordering labs/tests, documenting, discussion with nursing staff, consultants, updating family and interview/physical exam    Camellia PARAS [place], DO [facility] Available via Epic secure chat 7am-7pm After these hours, please refer to coverage provider listed on [email or web address] 04/18/2024, 11:11 AM

## 2024-04-18 NOTE — Consult Note (Addendum)
 Cardiology Consultation   Patient ID: Carrie Haynes MRN: [id number]; DOB: November 17, 1940  Admit date: 04/16/2024 Date of Consult: 04/18/2024  PCP:  Teresa Channel, MD   [facility] Providers Cardiologist:  Jerel Balding, MD     Patient Profile: Carrie Haynes is a 83 y.o. female with a hx of hypertension, hyperlipidemia, persistent atrial fibrillation, history of tachycardia cardiomyopathy with resolution of LV dysfunction, with normal left heart filling pressures on RHC, type 2 diabetes, remote history of VTE, chronic hypoxic respiratory failure, interstitial lung disease on chronic 4 L oxygen , pulmonary hypertension, iron deficiency anemia, who is being seen 04/18/2024 for the evaluation of acute on chronic HFpEF at the request of Dr. [place].  History of Present Illness: Ms. Carrie Haynes has past medical history as stated above. She presented to [facility] ED on 04/16/2024 complaining of shortness of breath, LE edema. She wears 4 L oxygen  at baseline, and reports that over the last two weeks she has become progressively more short of breath with walking.   Relevant workup in the ED/since admission includes: BMP showed creatinine 1.01 > 1.06 > 1.18, BNP 532 > 466, troponin negative x 2, CBC showed anemia with hemoglobin 10.1, below her baseline. CXR showed no acute disease, chronic thickening consistent with ILD, EKG showed sinus rhythm, HR 85, ectopy.   She was admitted to medicine service and started on IV Lasix , with good urine output. Continued on her home losartan, Toprol , Tikosyn , Xarelto . She had an updated echocardiogram that showed EF 55-60%, G1DD, D-shaped septum, moderately reduced RV function, severely enlarged RV, elevated PASP at 92.8 mmHg, dilated RA, severe TR, dilated IVC.  Prior echo from 05/2023 showed EF 55-60%, G1DD, PASP 67.3 mmHg, mild MR, moderate TR, normal IVC.  Cardiology was consulted in the setting of acute on chronic HFpEF.  After speaking to the patient, she  reports that she was eating more sodium recently and believes that led to her being volume overloaded. She reports that her LE edema was the most noticeable as she is fairly short of breath at baseline. She has responded well to the IV Lasix  with over 5 L of urine output noted. She states that her symptoms feel much improved compared to when she came in. She was not on any diuretics at home. She says that she takes her weight when she feels like she has to, which she indicates is not every day because she is retired and does not wake up early most days. She complains of some LE soreness with the edema improving. She is currently on her baseline oxygen  use and feels better in terms of DOE when compared to presentation to the ED.   Past Medical History:  Diagnosis Date   A-fib (HCC)    Diabetes mellitus without complication (HCC)    Hyperlipemia    Hypertension    Past Surgical History:  Procedure Laterality Date   CARDIOVERSION N/A 05/18/2017   Procedure: CARDIOVERSION;  Surgeon: Delford Maude BROCKS, MD;  Location: [facility] ENDOSCOPY;  Service: Cardiovascular;  Laterality: N/A;   CARDIOVERSION N/A 05/23/2017   Procedure: CARDIOVERSION;  Surgeon: Shlomo Wilbert SAUNDERS, MD;  Location: [facility] ENDOSCOPY;  Service: Cardiovascular;  Laterality: N/A;   CARDIOVERSION N/A 08/28/2019   Procedure: CARDIOVERSION;  Surgeon: Jeffrie Oneil BROCKS, MD;  Location: [facility] ENDOSCOPY;  Service: Cardiovascular;  Laterality: N/A;   COLONOSCOPY N/A 08/07/2020   Procedure: COLONOSCOPY;  Surgeon: Burnette Fallow, MD;  Location: [facility] ENDOSCOPY;  Service: Endoscopy;  Laterality: N/A;   ESOPHAGOGASTRODUODENOSCOPY N/A 08/07/2020  Procedure: ESOPHAGOGASTRODUODENOSCOPY (EGD);  Surgeon: Burnette Fallow, MD;  Location: [facility] ENDOSCOPY;  Service: Endoscopy;  Laterality: N/A;   GIVENS CAPSULE STUDY N/A 08/09/2020   Procedure: GIVENS CAPSULE STUDY;  Surgeon: Burnette Fallow, MD;  Location: [facility] ENDOSCOPY;  Service: Endoscopy;  Laterality: N/A;   RIGHT HEART CATH N/A  06/23/2023   Procedure: RIGHT HEART CATH;  Surgeon: Cherrie Toribio SAUNDERS, MD;  Location: [facility] INVASIVE CV LAB;  Service: Cardiovascular;  Laterality: N/A;   RIGHT/LEFT HEART CATH AND CORONARY ANGIOGRAPHY N/A 05/12/2017   Procedure: Right/Left Heart Cath and Coronary Angiography;  Surgeon: Claudene Victory ORN, MD;  Location: [facility] INVASIVE CV LAB;  Service: Cardiovascular;  Laterality: N/A;   TEE WITHOUT CARDIOVERSION N/A 05/18/2017   Procedure: TRANSESOPHAGEAL ECHOCARDIOGRAM (TEE);  Surgeon: Delford Maude BROCKS, MD;  Location: [facility] ENDOSCOPY;  Service: Cardiovascular;  Laterality: N/A;    Home Medications:  Prior to Admission medications   Medication Sig Start Date End Date Taking? Authorizing Provider  atorvastatin  (LIPITOR) 20 MG tablet Take 20 mg by mouth every evening. 08/13/20  Yes [provider]  Cholecalciferol  (VITAMIN D -3) 5000 units TABS Take 5,000 Units by mouth every evening.   Yes [provider]  dofetilide  (TIKOSYN ) 500 MCG capsule TAKE 1 CAPSULE BY MOUTH TWICE A DAY 11/13/23  Yes Croitoru, Mihai, MD  losartan (COZAAR) 50 MG tablet Take 50 mg by mouth every evening. 12/09/19  Yes [provider]  Magnesium  Oxide 500 MG TABS Take 500 mg by mouth every evening. 12/16/21  Yes [provider]  metFORMIN (GLUCOPHAGE-XR) 500 MG 24 hr tablet Take 500 mg by mouth at bedtime.   Yes [provider]  metoprolol  succinate (TOPROL -XL) 50 MG 24 hr tablet TAKE 1 TABLET BY MOUTH EVERY DAY Patient taking differently: Take 50 mg by mouth every evening. 10/20/23  Yes Croitoru, Mihai, MD  Pirfenidone  (ESBRIET ) 801 MG TABS Take 1 tablet (801 mg total) by mouth 3 (three) times daily with meals. Patient taking differently: Take 801 mg by mouth 2 (two) times daily with a meal. 02/06/24  Yes Mannam, Praveen, MD  XARELTO  20 MG TABS tablet TAKE 1 TABLET BY MOUTH DAILY WITH SUPPER 12/18/23  Yes Croitoru, Mihai, MD  LUMIGAN 0.01 % SOLN Place 1 drop into both eyes at bedtime. Patient not  taking: Reported on 04/17/2024 05/12/23   [provider]    Scheduled Meds:  atorvastatin   20 mg Oral QPM   dofetilide   500 mcg Oral BID   furosemide   40 mg Intravenous Q12H   insulin  aspart  0-5 Units Subcutaneous QHS   insulin  aspart  0-6 Units Subcutaneous TID WC   losartan  50 mg Oral Daily   metoprolol  succinate  50 mg Oral Daily   Pirfenidone   801 mg Oral TID WC   rivaroxaban   20 mg Oral Q supper   sodium chloride  flush  3 mL Intravenous Q12H   Continuous Infusions:  PRN Meds: acetaminophen  **OR** acetaminophen , oxyCODONE , senna-docusate  Allergies:    Allergies  Allergen Reactions   Calcium  Channel Blockers Other (See Comments)    Acute hypotension, patient became brady/asystole < 5 seconds   Estrogens Conjugated Other (See Comments)    Other reaction(s): DVT   Nystatin Hives   Social History:   Social History   Socioeconomic History   Marital status: Widowed    Spouse name: Not on file   Number of children: Not on file   Years of education: Not on file   Highest education level: Not on file  Occupational History   Not on file  Tobacco Use   Smoking status: Never   Smokeless tobacco: Never  Vaping Use   Vaping status: Never Used  Substance and Sexual Activity   Alcohol use: No   Drug use: No   Sexual activity: Not Currently  Other Topics Concern   Not on file  Social History Narrative   Not on file   Social Drivers of Health   Financial Resource Strain: Not on file  Food Insecurity: Patient Declined (04/17/2024)   Hunger Vital Sign    Worried About Running Out of Food in the Last Year: Patient declined    Ran Out of Food in the Last Year: Patient declined  Transportation Needs: Patient Declined (04/17/2024)   PRAPARE - Administrator, Civil Service (Medical): Patient declined    Lack of Transportation (Non-Medical): Patient declined  Physical Activity: Not on file  Stress: Not on file  Social Connections: Patient Declined  (04/17/2024)   Social Connection and Isolation Panel    Frequency of Communication with Friends and Family: Patient declined    Frequency of Social Gatherings with Friends and Family: Patient declined    Attends Religious Services: Patient declined    Database administrator or Organizations: Patient declined    Attends Banker Meetings: Patient declined    Marital Status: Patient declined  Intimate Partner Violence: Patient Declined (04/17/2024)   Humiliation, Afraid, Rape, and Kick questionnaire    Fear of Current or Ex-Partner: Patient declined    Emotionally Abused: Patient declined    Physically Abused: Patient declined    Sexually Abused: Patient declined    Family History:   Family History  Problem Relation Age of Onset   Hypertension Father    Heart attack Sister    Breast cancer Other     ROS:  Please see the history of present illness.  All other ROS reviewed and negative.     Physical Exam/Data: Vitals:   04/18/24 0622 04/18/24 0714 04/18/24 1102 04/18/24 1104  BP: 101/63 101/60 (!) 86/51 (!) 92/58  Pulse: 77 78 76 82  Resp: (!) 23 20 18 20   Temp:  98.6 F (37 C)  98 F (36.7 C)  TempSrc:  Oral  Oral  SpO2:  92% 97% 94%  Weight:      Height:        Intake/Output Summary (Last 24 hours) at 04/18/2024 1204 Last data filed at 04/18/2024 1100 Gross per 24 hour  Intake 888 ml  Output 5600 ml  Net -4712 ml      04/18/2024    5:06 AM 04/16/2024    2:25 PM 02/19/2024    3:07 PM  Last 3 Weights  Weight (lbs) 213 lb 10 oz 209 lb 210 lb  Weight (kg) 96.9 kg 94.802 kg 95.255 kg     Body mass index is 41.72 kg/m.   General:  in no acute distress, sitting up in chair, on chronic oxygen  HEENT: normal Neck: unable to assess JVD Vascular: No carotid bruits; Distal pulses 2+ bilaterally Cardiac:  normal S1, S2; RRR; no murmur  Lungs:  diminished breat sounds with crackles at bases, no accessory muscle use  Abd: soft, nontender Ext: 1+ pitting LE edema,  improving per patient, chronic venous changes noted  Musculoskeletal:  No deformities, BUE and BLE strength normal and equal Skin: warm and dry  Neuro:  no focal abnormalities noted Psych:  Normal affect   EKG:  The EKG was  personally reviewed and demonstrates:  sinus rhythm, HR 85, occasional ectopy  Telemetry:  Telemetry was personally reviewed and demonstrates:  sinus rhythm, HR 70s, PVCs  Relevant CV Studies:  Echocardiogram, 7/2/20525 Left ventricular ejection fraction, by estimation, is 55 to 60% . The left ventricle has normal function. The left ventricle has no regional wall motion abnormalities. Left ventricular diastolic parameters are consistent with Grade I diastolic dysfunction ( impaired relaxation) .  D- shaped ventricular septum suggestive of RV pressure/ volume overload. Right ventricular systolic function is moderately reduced. The right ventricular size is severely enlarged. There is severely elevated pulmonary artery systolic pressure. The estimated right ventricular systolic pressure is 92. 8 mmHg.  Right atrial size was moderately dilated.  The mitral valve is normal in structure. Trivial mitral valve regurgitation. No evidence of mitral stenosis. Moderate mitral annular calcification.  The tricuspid valve is abnormal. Tricuspid valve regurgitation is severe. Systolic flow reversal in the hepatic vein doppler pattern.  The aortic valve is tricuspid. There is moderate calcification of the aortic valve. Aortic valve regurgitation is not visualized. Aortic valve sclerosis/ calcification is present, without any evidence of aortic stenosis.  The inferior vena cava is dilated in size with < 50% respiratory variability, suggesting right atrial pressure of 15 mmHg.  Right heart cath, 06/23/2023 Findings:  RA = 4 RV = 71/10 PA = 68/21 (42) PCW = 10 Fick cardiac output/index = 4.2/2.2 PVR = 7.5 WU FA sat = 98% PA sat = 59% PAPi = 11.8   Assessment: Moderate PAH with normal  left sided pressures. 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  Reinforced importance of using O2 and weight loss.   RHC/LHC, 05/12/2017 Right dominant coronary anatomy. Normal coronary arteries. Estimated left ventricular ejection fraction 35-40%, global hypokinesis, with elevated filling pressures consistent with acute on chronic combined systolic and diastolic heart failure. RECOMMENDATIONS:  Consideration of tachycardia related left ventricular systolic dysfunction should be given. Rate control and possible rhythm control of atrial fibrillation per primary team.  Laboratory Data: High Sensitivity Troponin:   Recent Labs  Lab 04/16/24 1459 04/16/24 1755  TROPONINIHS 11 12     Chemistry Recent Labs  Lab 04/16/24 1459 04/17/24 0530 04/18/24 0228  NA 130* 133* 130*  K 5.1 4.8 4.3  CL 96* 97* 92*  CO2 24 26 29   GLUCOSE 113* 167* 143*  BUN 23 29* 25*  CREATININE 1.01* 1.06* 1.18*  CALCIUM  8.7* 8.7* 8.5*  MG  --  1.9 1.6*  GFRNONAA 56* 53* 47*  ANIONGAP 10 10 9     Recent Labs  Lab 04/16/24 1459  PROT 6.6  ALBUMIN 2.9*  AST 32  ALT 20  ALKPHOS 71  BILITOT 0.8   Lipids No results for input(s): CHOL, TRIG, HDL, LABVLDL, LDLCALC, CHOLHDL in the last 168 hours.  Hematology Recent Labs  Lab 04/16/24 1459 04/17/24 0530  WBC 5.1 5.7  RBC 3.72* 3.50*  HGB 10.7* 10.1*  HCT 34.9* 32.4*  MCV 93.8 92.6  MCH 28.8 28.9  MCHC 30.7 31.2  RDW 15.5 15.5  PLT 173 180   Thyroid  No results for input(s): TSH, FREET4 in the last 168 hours.  BNP Recent Labs  Lab 04/16/24 1459 04/18/24 0228  BNP 532.1* 466.1*    DDimer No results for input(s): DDIMER in the last 168  hours.  Radiology/Studies:  ECHOCARDIOGRAM COMPLETE Result Date: 04/17/2024    ECHOCARDIOGRAM REPORT   Patient Name:    Carrie Haynes Date of Exam: 04/17/2024 Medical Rec #:  [id number]        Height:       60.0 in Accession #:    [id number]       Weight:       209.0 lb Date of Birth:  12-Mar-1941       BSA:          1.902 m Patient Age:    82 years         BP:           124/51 mmHg Patient Gender: F                HR:           77 bpm. Exam Location:  Inpatient Procedure: 2D Echo, Cardiac Doppler and Color Doppler (Both Spectral and Color            Flow Doppler were utilized during procedure). Indications:    CHF-Acute Diastolic I50.31  History:        Patient has prior history of Echocardiogram examinations, most                 recent 05/18/2023. Arrythmias:Atrial Fibrillation; Risk                 Factors:Hypertension and Diabetes.  Sonographer:    Jayson Gaskins Referring Phys: [id number] TIMOTHY S OPYD IMPRESSIONS  1. Left ventricular ejection fraction, by estimation, is 55 to 60%. The left ventricle has normal function. The left ventricle has no regional wall motion abnormalities. Left ventricular diastolic parameters are consistent with Grade I diastolic dysfunction (impaired relaxation).  2. D-shaped ventricular septum suggestive of RV pressure/volume overload. Right ventricular systolic function is moderately reduced. The right ventricular size is severely enlarged. There is severely elevated pulmonary artery systolic pressure. The estimated right ventricular systolic pressure is 92.8 mmHg.  3. Right atrial size was moderately dilated.  4. The mitral valve is normal in structure. Trivial mitral valve regurgitation. No evidence of mitral stenosis. Moderate mitral annular calcification.  5. The tricuspid valve is abnormal. Tricuspid valve regurgitation is severe. Systolic flow reversal in the hepatic vein doppler pattern.  6. The aortic valve is tricuspid. There is moderate calcification of the aortic valve. Aortic valve regurgitation is not visualized. Aortic valve sclerosis/calcification is present, without any evidence of  aortic stenosis.  7. The inferior vena cava is dilated in size with <50% respiratory variability, suggesting right atrial pressure of 15 mmHg. FINDINGS  Left Ventricle: Left ventricular ejection fraction, by estimation, is 55 to 60%. The left ventricle has normal function. The left ventricle has no regional wall motion abnormalities. The left ventricular internal cavity size was normal in size. There is  no left ventricular hypertrophy. Left ventricular diastolic parameters are consistent with Grade I diastolic dysfunction (impaired relaxation). Right Ventricle: D-shaped ventricular septum suggestive of RV pressure/volume overload. The right ventricular size is severely enlarged. No increase in right ventricular wall thickness. Right ventricular systolic function is moderately reduced. There is severely elevated pulmonary artery systolic pressure. The tricuspid regurgitant velocity is 4.41 m/s, and with an assumed right atrial pressure of 15 mmHg, the estimated right ventricular systolic pressure is 92.8 mmHg. Left Atrium: Left atrial size was normal in size. Right Atrium: Right atrial size was moderately dilated. Pericardium: Trivial pericardial effusion is present. Mitral Valve: The mitral  valve is normal in structure. Moderate mitral annular calcification. Trivial mitral valve regurgitation. No evidence of mitral valve stenosis. Tricuspid Valve: The tricuspid valve is abnormal. Tricuspid valve regurgitation is severe. Aortic Valve: The aortic valve is tricuspid. There is moderate calcification of the aortic valve. Aortic valve regurgitation is not visualized. Aortic valve sclerosis/calcification is present, without any evidence of aortic stenosis. Aortic valve mean gradient measures 4.0 mmHg. Aortic valve peak gradient measures 7.4 mmHg. Aortic valve area, by VTI measures 2.22 cm. Pulmonic Valve: The pulmonic valve was normal in structure. Pulmonic valve regurgitation is not visualized. Aorta: The aortic root is  normal in size and structure. Venous: The inferior vena cava is dilated in size with less than 50% respiratory variability, suggesting right atrial pressure of 15 mmHg. IAS/Shunts: No atrial level shunt detected by color flow Doppler.  LEFT VENTRICLE PLAX 2D LVIDd:         4.40 cm   Diastology LVIDs:         2.80 cm   LV e' medial:    10.40 cm/s LV PW:         1.10 cm   LV E/e' medial:  4.7 LV IVS:        0.90 cm   LV e' lateral:   8.81 cm/s LVOT diam:     1.80 cm   LV E/e' lateral: 5.5 LV SV:         51 LV SV Index:   27 LVOT Area:     2.54 cm  RIGHT VENTRICLE             IVC RV Basal diam:  4.80 cm     IVC diam: 2.80 cm RV Mid diam:    4.80 cm RV S prime:     17.50 cm/s TAPSE (M-mode): 2.2 cm LEFT ATRIUM             Index        RIGHT ATRIUM           Index LA Vol (A2C):   52.2 ml 27.45 ml/m  RA Area:     29.60 cm LA Vol (A4C):   28.1 ml 14.78 ml/m  RA Volume:   112.00 ml 58.89 ml/m LA Biplane Vol: 39.6 ml 20.82 ml/m  AORTIC VALVE AV Area (Vmax):    2.19 cm AV Area (Vmean):   2.13 cm AV Area (VTI):     2.22 cm AV Vmax:           136.00 cm/s AV Vmean:          97.800 cm/s AV VTI:            0.229 m AV Peak Grad:      7.4 mmHg AV Mean Grad:      4.0 mmHg LVOT Vmax:         117.00 cm/s LVOT Vmean:        81.700 cm/s LVOT VTI:          0.200 m LVOT/AV VTI ratio: 0.87  AORTA Ao Root diam: 2.70 cm MITRAL VALVE               TRICUSPID VALVE MV Area (PHT): 2.48 cm    TR Peak grad:   77.8 mmHg MV Decel Time: 306 msec    TR Vmax:        441.00 cm/s MV E velocity: 48.70 cm/s MV A velocity: 77.60 cm/s  SHUNTS MV E/A ratio:  0.63  Systemic VTI:  0.20 m                            Systemic Diam: 1.80 cm Dalton McleanMD Electronically signed by Ezra Kanner Signature Date/Time: 04/17/2024/4:09:11 PM    Final    DG Chest 2 View Result Date: 04/16/2024 CLINICAL DATA:  Short of breath. EXAM: CHEST - 2 VIEW COMPARISON:  08/05/2020.  High-resolution chest CT, 08/28/2023. FINDINGS: Cardiac silhouette is mildly  enlarged, stable. No mediastinal or hilar masses. No convincing adenopathy. Bilateral interstitial thickening is stable. No lung consolidation or convincing edema. No pleural effusion or pneumothorax. Skeletal structures are demineralized, but intact. IMPRESSION: 1. No acute cardiopulmonary disease. 2. Chronic interstitial thickening consistent with interstitial lung disease. Stable cardiomegaly. Electronically Signed   By: Alm Parkins M.D.   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 Pulmonary hypertension  Presented with progressive DOE, LE edema  BNP 532 > 466 CXR showed stable cardiomegaly  Given IV Lasix  40 mg x 5 doses so far  Net - 5.4 L this admission Creatinine trending up, 1.18 today continue to monitor Home meds: Toprol  50 mg daily, Losartan 50 mg daily Wears 4 L oxygen  chronically at home, currently on 5 L  Reports improvement in symptoms since admission  Continue IV Lasix  40 mg BID Continue strict I&O's, daily BMPs, daily weights Continue Toprol  50 mg daily Continue Losartan 50 mg daily   Paroxysmal atrial fibrillation   Known history of PAF Home meds: Toprol  50 mg daily, Tikosyn  500 mcg BID, Xarelto  20 mg daily Currently in sinus with HR 70s Continue Toprol  50 mg daily Continue Tikosyn  500 mcg daily Continue Xarelto  20 mg daily   Hyperlipidemia Continue Lipitor 20 mg daily   Per primary Electrolyte disturbances Chronic hypoxic respiratory failure, ILD Idiopathic pulmonary fibrosis  On chronic oxygen   Type 2 diabetes Pulmonary HTN 2/2 lung disease  Weakness, deconditioning    Risk Assessment/Risk Scores:     New [place]  Heart Association (NYHA) Functional Class NYHA Class III  CHA2DS2-VASc Score = 6   This indicates a 9.7% annual risk of stroke. The patient's score is based upon: CHF History: 1 HTN History: 1 Diabetes History: 1 Stroke History: 0 Vascular Disease History: 0 Age Score: 2 Gender Score: 1        For questions or updates, please contact [facility] Please consult [email or web address] for contact info under    Signed, Waddell DELENA Donath, PA-C  04/18/2024 12:04 PM  Patient seen and examined.  Agree with below documentation.  Ms. Corzine is an 83 year old female with history of hypertension, hyperlipidemia, persistent A-fib, T2DM, chronic hypoxic respiratory failure, interstitial lung disease, pulmonary hypertension who we are consulted for evaluation of diastolic heart failure at the request of Dr. [place].  She follows with Dr. Francyne, last seen 07/2023.  She had undergone RHC 06/2023 which showed moderate to severe pulmonary arterial hypertension (70/10 despite normal wedge), felt to be WHO group 3 due to her interstitial lung disease, as she has pulmonary fibrosis/usual interstitial pneumonia for which she follows with pulmonology.  She presented to the ED with worsening shortness of breath.  Labs notable for troponin negative x 2, BNP 532, creatinine 1.01, hemoglobin 10.1.  EKG shows sinus rhythm, rate 85, PVC, left axis deviation, poor R wave progression.  Echocardiogram yesterday showed EF 55 to 60%, grade 1 diastolic dysfunction, D-shaped ventricular septum suggestive of RV pressure/volume overload,  moderate RV dysfunction, severe RV enlargement, RVSP 93 mmHg, severe tricuspid regurgitation, RAP 15.  On exam, patient is alert and oriented, regular rate and rhythm, no murmurs, inspiratory crackles on lung exam, no LE edema, + JVD. Suspect presentation is due to acute on chronic diastolic/RV failure in setting of severe pulmonary hypertension from her interstitial lung disease.  She remains volume overloaded, would continue IV Lasix .  For her A-fib, she is maintaining sinus rhythm, continue Xarelto  metoprolol  and and Tikosyn .  QTc 452 on EKG yesterday.  Lonni LITTIE Nanas, MD

## 2024-04-18 NOTE — Evaluation (Signed)
 Physical Therapy Evaluation Patient Details Name: Carrie Haynes MRN: [id number] DOB: 1941/03/16 Today's Date: 04/18/2024  History of Present Illness  83 yo female adm 04/16/24 with LB edema and SOB, acute on chronic HFpEF. PMhx: ILD, T2DM, HTN, Afib on Xarelto , pulmonary HTN  Clinical Impression  Pt pleasant, talkative and lives with long term boyfriend who assists with IADLs. Pt reports lengthy O2 tubing at least 50' at home and does not generally change FiO2. Pt on 5L at rest at 98%, 4L at rest 95% which is baseline but noted significant desaturation to 78% with in room mobility requiring and 7L to recover. Pt educated for pursed lip breathing, energy conservation, decreased length of tubing to achieve appropriate liter flow and activity progression. Pt with decreased activity and pulmonary function who will benefit from acute therapy to maximize mobility and safety as well as HHPT.         If plan is discharge home, recommend the following: A little help with walking and/or transfers;A little help with bathing/dressing/bathroom;Assistance with cooking/housework;Assist for transportation   Can travel by private vehicle        Equipment Recommendations None recommended by PT  Recommendations for Other Services  OT consult    Functional Status Assessment Patient has had a recent decline in their functional status and/or demonstrates limited ability to make significant improvements in function in a reasonable and predictable amount of time     Precautions / Restrictions Precautions Precautions: Fall;Other (comment) Recall of Precautions/Restrictions: Impaired Precaution/Restrictions Comments: watch SPO2 with activity      Mobility  Bed Mobility               General bed mobility comments: EOB on arrival and chair end of session    Transfers Overall transfer level: Needs assistance   Transfers: Sit to/from Stand Sit to Stand: Supervision           General  transfer comment: supervision for lines    Ambulation/Gait Ambulation/Gait assistance: Supervision Gait Distance (Feet): 25 Feet Assistive device: Rolling walker (2 wheels) Gait Pattern/deviations: Step-through pattern, Decreased stride length, Trunk flexed   Gait velocity interpretation: <1.8 ft/sec, indicate of risk for recurrent falls   General Gait Details: cues to step into RW and for breathing technique, pt with desaturation to 78% on 4L at end of gati requiring 7L and 2 min to recover then able to return to 4L at rest  Stairs            Wheelchair Mobility     Tilt Bed    Modified Rankin (Stroke Patients Only)       Balance Overall balance assessment: Needs assistance   Sitting balance-Leahy Scale: Fair     Standing balance support: Bilateral upper extremity supported, Reliant on assistive device for balance, During functional activity Standing balance-Leahy Scale: Poor Standing balance comment: Rw for gait                             Pertinent Vitals/Pain Pain Assessment Pain Assessment: No/denies pain    Home Living Family/patient expects to be discharged to:: Private residence Living Arrangements: Spouse/significant other Available Help at Discharge: Available 24 hours/day Type of Home: House Home Access: Level entry       Home Layout: One level Home Equipment: Agricultural consultant (2 wheels);Rollator (4 wheels);Cane - single point;Shower seat;Wheelchair - manual      Prior Function Prior Level of Function : Needs assist  Mobility Comments: walks limited household distance with rollator on 4L of O2 but doesn't consistently check pulse ox ADLs Comments: boyfriend does the IADLs and they usually eat out or he grabs food, slow with ADLs     Extremity/Trunk Assessment   Upper Extremity Assessment Upper Extremity Assessment: Generalized weakness    Lower Extremity Assessment Lower Extremity Assessment: Generalized  weakness    Cervical / Trunk Assessment Cervical / Trunk Assessment: Kyphotic;Other exceptions Cervical / Trunk Exceptions: increased body habitus  Communication   Communication Communication: No apparent difficulties    Cognition Arousal: Alert Behavior During Therapy: WFL for tasks assessed/performed   PT - Cognitive impairments: No apparent impairments                       PT - Cognition Comments: pt very slow to initiate movement and also limited by urinary frequency and urgency on lasix  Following commands: Intact       Cueing Cueing Techniques: Verbal cues     General Comments      Exercises     Assessment/Plan    PT Assessment Patient needs continued PT services  PT Problem List Decreased strength;Decreased activity tolerance;Decreased balance;Decreased mobility;Decreased knowledge of use of DME;Cardiopulmonary status limiting activity       PT Treatment Interventions DME instruction;Gait training;Functional mobility training;Therapeutic activities;Therapeutic exercise;Patient/family education    PT Goals (Current goals can be found in the Care Plan section)  Acute Rehab PT Goals Patient Stated Goal: return home PT Goal Formulation: With patient/family Time For Goal Achievement: 05/02/24 Potential to Achieve Goals: Fair    Frequency Min 2X/week     Co-evaluation               AM-PAC PT 6 Clicks Mobility  Outcome Measure Help needed turning from your back to your side while in a flat bed without using bedrails?: A Little Help needed moving from lying on your back to sitting on the side of a flat bed without using bedrails?: A Little Help needed moving to and from a bed to a chair (including a wheelchair)?: A Little Help needed standing up from a chair using your arms (e.g., wheelchair or bedside chair)?: A Little Help needed to walk in hospital room?: A Little Help needed climbing 3-5 steps with a railing? : A Lot 6 Click Score:  17    End of Session Equipment Utilized During Treatment: Oxygen  Activity Tolerance: Patient tolerated treatment well Patient left: in chair;with call bell/phone within reach;with family/visitor present Nurse Communication: Mobility status PT Visit Diagnosis: Other abnormalities of gait and mobility (R26.89);Difficulty in walking, not elsewhere classified (R26.2);Muscle weakness (generalized) (M62.81)    Time: 9258-9181 PT Time Calculation (min) (ACUTE ONLY): 37 min   Charges:   PT Evaluation $PT Eval Moderate Complexity: 1 Mod PT Treatments $Therapeutic Activity: 8-22 mins PT General Charges $$ ACUTE PT VISIT: 1 Visit         Lenoard SQUIBB, PT Acute Rehabilitation Services Office: [phone]   Lenoard NOVAK Shawndra Clute 04/18/2024, 10:35 AM

## 2024-04-18 NOTE — Progress Notes (Signed)
 Heart Failure Navigator Progress Note  Assessed for Heart & Vascular TOC clinic readiness.  Patient does not meet criteria due to - [facility] 7/29 - EF stable 55-60%, No HF TOC.   Navigator will sign off at this time.   Stephane Haddock, BSN, Scientist, clinical (histocompatibility and immunogenetics) Only

## 2024-04-18 NOTE — Evaluation (Signed)
 Occupational Therapy Evaluation Patient Details Name: Carrie Haynes MRN: [id number] DOB: [birth year] Today's Date: 04/18/2024   History of Present Illness   83 yo female adm 04/16/24 with LB edema and SOB, acute on chronic HFpEF. PMhx: ILD, T2DM, HTN, Afib on Xarelto , pulmonary HTN     Clinical Impressions Pt admitted based on above, and was seen based on problem list below. PTA pt was independent with ADLs but required frequent rest breaks. Today pt is requiring set up  to min  assist for ADLs. Bed mobility and functional transfers are  CGA. During ADLs pt destating O2 levels to 84, unable to recover with pursed lip breathing, requiring up to 8L HFNC to recover and maintain >88%. Pt would benefit from continued education on AE and compensatory strategies for ADLs. Recommendation of HHOT to build activity tolerance. OT will continue to follow acutely to maximize functional independence.     If plan is discharge home, recommend the following:   A little help with walking and/or transfers;A little help with bathing/dressing/bathroom;Assistance with cooking/housework     Functional Status Assessment   Patient has had a recent decline in their functional status and demonstrates the ability to make significant improvements in function in a reasonable and predictable amount of time.     Equipment Recommendations   BSC/3in1 (bariatric)      Precautions/Restrictions   Precautions Precautions: Fall;Other (comment) Recall of Precautions/Restrictions: Impaired Precaution/Restrictions Comments: watch SPO2 with activity Restrictions Weight Bearing Restrictions Per Provider Order: No     Mobility Bed Mobility Overal bed mobility: Needs Assistance Bed Mobility: Sit to Supine       Sit to supine: Contact guard assist, HOB elevated, Used rails   General bed mobility comments: Increased time and effort    Transfers Overall transfer level: Needs assistance Equipment used:  Rolling walker (2 wheels) Transfers: Sit to/from Stand, Bed to chair/wheelchair/BSC Sit to Stand: Supervision     Step pivot transfers: Contact guard assist     General transfer comment: CGA for balance      Balance Overall balance assessment: Needs assistance Sitting-balance support: No upper extremity supported, Feet supported Sitting balance-Leahy Scale: Fair     Standing balance support: Bilateral upper extremity supported, Reliant on assistive device for balance, During functional activity Standing balance-Leahy Scale: Poor Standing balance comment: Reliant on RW       ADL either performed or assessed with clinical judgement   ADL Overall ADL's : Needs assistance/impaired Eating/Feeding: Set up;Sitting   Grooming: Set up;Sitting           Upper Body Dressing : Set up;Sitting   Lower Body Dressing: Minimal assistance;Sit to/from stand Lower Body Dressing Details (indicate cue type and reason): Assist for socks, intermittent steadying assist if pt fatiguing Toilet Transfer: Contact guard assist;Rolling walker (2 wheels);Ambulation Toilet Transfer Details (indicate cue type and reason): CGA for balance Toileting- Clothing Manipulation and Hygiene: Contact guard assist;Minimal assistance Toileting - Clothing Manipulation Details (indicate cue type and reason): CGA to min assist depending on fatigue levels     Functional mobility during ADLs: Contact guard assist;Minimal assistance;Rolling walker (2 wheels) General ADL Comments: Pt with safety once fatigue sets in with standing ADLs, began education on AE     Vision Baseline Vision/History: 0 No visual deficits Vision Assessment?: No apparent visual deficits            Pertinent Vitals/Pain Pain Assessment Pain Assessment: No/denies pain     Extremity/Trunk Assessment Upper Extremity Assessment Upper Extremity Assessment: Generalized  weakness   Lower Extremity Assessment Lower Extremity Assessment:  Defer to PT evaluation   Cervical / Trunk Assessment Cervical / Trunk Assessment: Kyphotic   Communication Communication Communication: No apparent difficulties   Cognition Arousal: Alert Behavior During Therapy: WFL for tasks assessed/performed Cognition: No apparent impairments       OT - Cognition Comments: Pt with poor safety awareness, especially when frustrated or fatigued     Following commands: Intact       Cueing  General Comments   Cueing Techniques: Verbal cues  Pt destating during standing ADLs to 84, requiring up to 8L HFNC to recover >88%           Home Living Family/patient expects to be discharged to:: Private residence Living Arrangements: Spouse/significant other Available Help at Discharge: Available 24 hours/day Type of Home: House Home Access: Level entry     Home Layout: One level     Bathroom Shower/Tub: Tub/shower unit;Sponge bathes at baseline   [employer]: Standard     Home Equipment: Agricultural consultant (2 wheels);Rollator (4 wheels);Cane - single point;Shower seat;Wheelchair - manual          Prior Functioning/Environment Prior Level of Function : Needs assist             Mobility Comments: walks limited household distance with rollator on 4L of O2 but doesn't consistently check pulse ox ADLs Comments: significant other completes IADLs and assists with LB prn    OT Problem List: Decreased strength;Decreased range of motion;Decreased activity tolerance;Impaired balance (sitting and/or standing);Decreased safety awareness;Decreased knowledge of use of DME or AE;Cardiopulmonary status limiting activity;Obesity   OT Treatment/Interventions: Self-care/ADL training;Therapeutic exercise;Energy conservation;Therapeutic activities;Patient/family education;Balance training      OT Goals(Current goals can be found in the care plan section)   Acute Rehab OT Goals Patient Stated Goal: To be independent OT Goal Formulation: With  patient Time For Goal Achievement: 05/02/24 Potential to Achieve Goals: Good   OT Frequency:  Min 2X/week       AM-PAC OT 6 Clicks Daily Activity     Outcome Measure Help from another person eating meals?: None Help from another person taking care of personal grooming?: A Little Help from another person toileting, which includes using toliet, bedpan, or urinal?: A Little Help from another person bathing (including washing, rinsing, drying)?: A Little Help from another person to put on and taking off regular upper body clothing?: A Little Help from another person to put on and taking off regular lower body clothing?: A Little 6 Click Score: 19   End of Session Equipment Utilized During Treatment: Gait belt;Rolling walker (2 wheels);Oxygen  Nurse Communication: Mobility status  Activity Tolerance: Patient limited by fatigue Patient left: in bed;with call bell/phone within reach;with bed alarm set;with family/visitor present  OT Visit Diagnosis: Unsteadiness on feet (R26.81);Other abnormalities of gait and mobility (R26.89)                Time: 8795-8753 OT Time Calculation (min): 42 min Charges:  OT General Charges $OT Visit: 1 Visit OT Evaluation $OT Eval Moderate Complexity: 1 Mod OT Treatments $Self Care/Home Management : 23-37 mins  Adrianne BROCKS, OT  Acute Rehabilitation Services Office [phone] Secure chat preferred   Adrianne GORMAN Savers 04/18/2024, 4:10 PM

## 2024-04-19 DIAGNOSIS — I480 Paroxysmal atrial fibrillation: Secondary | ICD-10-CM

## 2024-04-19 DIAGNOSIS — I2609 Other pulmonary embolism with acute cor pulmonale: Secondary | ICD-10-CM | POA: Diagnosis not present

## 2024-04-19 DIAGNOSIS — J84112 Idiopathic pulmonary fibrosis: Secondary | ICD-10-CM

## 2024-04-19 DIAGNOSIS — I5033 Acute on chronic diastolic (congestive) heart failure: Secondary | ICD-10-CM | POA: Diagnosis not present

## 2024-04-19 DIAGNOSIS — Z5181 Encounter for therapeutic drug level monitoring: Secondary | ICD-10-CM

## 2024-04-19 LAB — BASIC METABOLIC PANEL WITH GFR
Anion gap: 10 (ref 5–15)
BUN: 27 mg/dL — ABNORMAL HIGH (ref 8–23)
CO2: 29 mmol/L (ref 22–32)
Calcium: 8.5 mg/dL — ABNORMAL LOW (ref 8.9–10.3)
Chloride: 90 mmol/L — ABNORMAL LOW (ref 98–111)
Creatinine, Ser: 1.26 mg/dL — ABNORMAL HIGH (ref 0.44–1.00)
GFR, Estimated: 43 mL/min — ABNORMAL LOW (ref >60)
Glucose, Bld: 108 mg/dL — ABNORMAL HIGH (ref 70–99)
Potassium: 4 mmol/L (ref 3.5–5.1)
Sodium: 129 mmol/L — ABNORMAL LOW (ref 135–145)

## 2024-04-19 LAB — GLUCOSE, CAPILLARY
Glucose-Capillary: 130 mg/dL — ABNORMAL HIGH (ref 70–99)
Glucose-Capillary: 222 mg/dL — ABNORMAL HIGH (ref 70–99)
Glucose-Capillary: 175 mg/dL — ABNORMAL HIGH (ref 70–99)
Glucose-Capillary: 226 mg/dL — ABNORMAL HIGH (ref 70–99)

## 2024-04-19 LAB — LIPID PANEL
Cholesterol: 95 mg/dL (ref 0–200)
HDL: 36 mg/dL — ABNORMAL LOW (ref >40)
LDL Cholesterol: 47 mg/dL (ref 0–99)
Total CHOL/HDL Ratio: 2.6 ratio
Triglycerides: 60 mg/dL (ref <150)
VLDL: 12 mg/dL (ref 0–40)

## 2024-04-19 LAB — MAGNESIUM: Magnesium: 2.2 mg/dL (ref 1.7–2.4)

## 2024-04-19 MED ORDER — METOPROLOL SUCCINATE ER 25 MG PO TB24
25.0000 mg | ORAL_TABLET | Freq: Every day | ORAL | Status: DC
  Administered 2024-04-20 – 2024-04-22 (×3): 25 mg via ORAL
  Filled 2024-04-19 (×3): qty 1

## 2024-04-19 MED ORDER — FUROSEMIDE 40 MG PO TABS
40.0000 mg | ORAL_TABLET | Freq: Every day | ORAL | Status: DC
  Administered 2024-04-20 – 2024-04-22 (×3): 40 mg via ORAL
  Filled 2024-04-19 (×4): qty 1

## 2024-04-19 MED ORDER — SODIUM CHLORIDE 0.9 % IV BOLUS
500.0000 mL | Freq: Once | INTRAVENOUS | Status: AC
  Administered 2024-04-19: 500 mL via INTRAVENOUS

## 2024-04-19 MED ORDER — SODIUM CHLORIDE 0.9 % IV BOLUS
500.0000 mL | Freq: Once | INTRAVENOUS | Status: AC
  Administered 2024-04-19: 250 mL via INTRAVENOUS

## 2024-04-19 MED ORDER — DOFETILIDE 250 MCG PO CAPS
250.0000 ug | ORAL_CAPSULE | Freq: Two times a day (BID) | ORAL | Status: DC
  Administered 2024-04-19 – 2024-04-21 (×4): 250 ug via ORAL
  Filled 2024-04-19 (×4): qty 1

## 2024-04-19 NOTE — Plan of Care (Signed)

## 2024-04-19 NOTE — Progress Notes (Signed)
 PROGRESS NOTE    Carrie Haynes  FMW:[id number] DOB: 01/13/1941 DOA: 04/16/2024 PCP: Teresa Channel, MD    Brief Narrative:   Carrie Haynes is a 83 y.o. female with past medical history significant for chronic hypoxic respiratory failure/interstitial lung disease on 4 L nasal cannula at baseline, DM2, HTN, paroxysmal atrial fibrillation on Xarelto , pulmonary HTN who presented to [facility] ED on 04/16/2024 with progressive shortness of breath, lower extremity edema.  Additionally reports decreased activity tolerance.  Cough mainly nonproductive.  Denies fever, no chest pain.  In the ED, temperature 98.1 F, HR 67, RR 18, BP 130/104, SpO2 100% on 4 L nasal cannula.  WBC 5.1, hemoglobin 10.7, platelet count 173.  Sodium 130, potassium 5.1, chloride 96, CO2 24, glucose 113, BUN 23, creat 1.01.  AST 32, ALT 20, 2 bilirubin 0.8.  BNP 532.1.  High sensitive troponin 11> 12.  Chest x-ray with no acute cardiopulmonary disease process.  Chronic interstitial thickening consistent with interstitial lung disease, stable cardiomegaly.  EKG with NSR, first-degree AV block, rate 70, QTc 442.  Patient was given furosemide  80 mg IV x 1 in the ED.  TRH consulted for admission for further evaluation management of diastolic CHF exacerbation.  Assessment & Plan:   Acute on chronic diastolic congestive heart failure Patient presenting with progressive dyspnea, lower extremity edema.  BNP elevated 532.1 with 2+ lower extremity edema.  Previous TTE 05/18/2023 with LVEF 55-60%, LV with no regional wall motion abnormalities, grade 1 diastolic dysfunction, RV mildly enlarged, RA mildly dilated, no aortic stenosis, IVC normal in size.  Follows with cardiology, Dr. Bette outpatient.  Repeat TTE 7/2 with LVEF 55 to 60%, no LV regional wall motion abnormalities, grade 1 diastolic dysfunction, IVC dilated. -- Cardiology consulted -- net negative 2.0L past 24h and net negative 6.8L since admission -- Furosemide  transitioned to 40 mg  p.o. daily starting tomorrow  -- Strict I's and O's and daily weights -- BMP daily  Hyponatremia Sodium 130 on admission, likely secondary to hypervolemic hyponatremia in the setting of volume overload. -- Na 130>133>130>129 -- Continue IV diuresis as above -- BMP daily  Hypomagnesemia Repleted magnesium  2.2 this morning -- Repeat electrolytes in the a.m.  Chronic hypoxic respiratory failure/interstitial lung disease, oxygen  dependent Follows with pulmonology outpatient, Dr. Theophilus. Dependent on 4 L nasal cannula at baseline. -- Continue supplemental oxygen , currently on 4 L nasal cannula   DM2 On metformin 500 mg p.o. daily at baseline. -- Hold oral hypoglycemics while inpatient -- Very sensitive SSI for coverage -- CBG before every meal/at bedtime  Paroxysmal atrial fibrillation on anticoagulation HTN -- Tikosyn  reduced to 250 mcg p.o. twice daily by cardiology -- Metoprolol  succinate 50 mg p.o. daily -- Losartan  50 mg p.o. daily -- Xarelto  20 mg p.o. daily  Pulmonary hypertension -- Pifenidone 801mg  PO TID  HLD -- Atorvastatin  20 g p.o. daily  Weakness/debility/deconditioning/gait disturbance: Patient currently resides at home, utilizes a rollator for ambulation. -- PT/OT recommending home health   DVT prophylaxis:  rivaroxaban  (XARELTO ) tablet 20 mg    Code Status: Full Code Family Communication: Updated family present at bedside this morning, anticipate discharge home potentially tomorrow if cardiology signed off  Disposition Plan:  Level of care: Telemetry Cardiac Status is: Inpatient Remains inpatient appropriate because: IV diuresis    Consultants:  Cardiology  Procedures:  TTE:   Antimicrobials:  None   Subjective: Patient seen examined bedside, lying in bed.  Updated family present at bedside this morning.  Seen by  cardiology and transitioning IV Lasix  to oral starting tomorrow.  Tikosyn  dose reduced by cardiology today.  Discussed  recommendations of home health by therapy with patient, agreeable.  Patient with no other complaints or concerns at this time.  Denies headache, no dizziness, no chest pain, no palpitations, no fever/chills/night sweats, no nausea/vomiting/diarrhea, no focal weakness, no fatigue, no paresthesias. No acute events overnight per nursing staff.  Objective: Vitals:   04/19/24 0100 04/19/24 0445 04/19/24 0714 04/19/24 1129  BP: 122/68 (!) 96/57 101/74 102/60  Pulse: 75 77  81  Resp: 20 20 (!) 22 20  Temp: 97.6 F (36.4 C) 97.9 F (36.6 C) 97.7 F (36.5 C) 98.7 F (37.1 C)  TempSrc: Oral Oral Oral Oral  SpO2: 94% 93% 95% 90%  Weight:  96.9 kg    Height:        Intake/Output Summary (Last 24 hours) at 04/19/2024 1135 Last data filed at 04/19/2024 0448 Gross per 24 hour  Intake 480 ml  Output 1850 ml  Net -1370 ml   Filed Weights   04/16/24 1425 04/18/24 0506 04/19/24 0445  Weight: 94.8 kg 96.9 kg 96.9 kg    Examination:  Physical Exam: GEN: NAD, alert and oriented x 3, chronically ill appearance HEENT: NCAT, PERRL, EOMI, sclera clear, MMM PULM: Breath sounds slight diminished bilateral bases with crackles, no wheezes, normal respiratory effort without accessory muscle use, on 4 L nasal cannula with SpO2 90% (baseline 4L [place]) CV: RRR w/o M/G/R GI: abd soft, NTND, NABS, no R/G/M MSK: 1+ bilateral lower extremity edema to mid shin with chronic venous changes, moves all extremity independently with preserved muscle strength NEURO: CN II-XII intact, no focal deficits, sensation to light touch intact PSYCH: normal mood/affect Integumentary: Chronic venous changes bilateral lower extremities, otherwise no other concerning rashes/lesions/wounds noted on exposed skin surfaces    Data Reviewed: I have personally reviewed following labs and imaging studies  CBC: Recent Labs  Lab 04/16/24 1459 04/17/24 0530  WBC 5.1 5.7  NEUTROABS 3.7  --   HGB 10.7* 10.1*  HCT 34.9* 32.4*  MCV 93.8  92.6  PLT 173 180   Basic Metabolic Panel: Recent Labs  Lab 04/16/24 1459 04/17/24 0530 04/18/24 0228 04/19/24 0213  NA 130* 133* 130* 129*  K 5.1 4.8 4.3 4.0  CL 96* 97* 92* 90*  CO2 24 26 29 29   GLUCOSE 113* 167* 143* 108*  BUN 23 29* 25* 27*  CREATININE 1.01* 1.06* 1.18* 1.26*  CALCIUM  8.7* 8.7* 8.5* 8.5*  MG  --  1.9 1.6* 2.2   GFR: Estimated Creatinine Clearance: 37.2 mL/min (A) (by C-G formula based on SCr of 1.26 mg/dL (H)). Liver Function Tests: Recent Labs  Lab 04/16/24 1459  AST 32  ALT 20  ALKPHOS 71  BILITOT 0.8  PROT 6.6  ALBUMIN 2.9*   No results for input(s): LIPASE, AMYLASE in the last 168 hours. No results for input(s): AMMONIA in the last 168 hours. Coagulation Profile: No results for input(s): INR, PROTIME in the last 168 hours. Cardiac Enzymes: No results for input(s): CKTOTAL, CKMB, CKMBINDEX, TROPONINI in the last 168 hours. BNP (last 3 results) Recent Labs    04/27/23 1648 11/21/23 1607  PROBNP 858* 323.0*   HbA1C: Recent Labs    04/17/24 0530  HGBA1C 7.3*   CBG: Recent Labs  Lab 04/18/24 0617 04/18/24 1058 04/18/24 1536 04/18/24 2101 04/19/24 0623  GLUCAP 131* 199* 152* 115* 130*   Lipid Profile: Recent Labs    04/19/24 9786  CHOL 95  HDL 36*  LDLCALC 47  TRIG 60  CHOLHDL 2.6   Thyroid  Function Tests: No results for input(s): TSH, T4TOTAL, FREET4, T3FREE, THYROIDAB in the last 72 hours. Anemia Panel: No results for input(s): VITAMINB12, FOLATE, FERRITIN, TIBC, IRON, RETICCTPCT in the last 72 hours. Sepsis Labs: No results for input(s): PROCALCITON, LATICACIDVEN in the last 168 hours.  No results found for this or any previous visit (from the past 240 hours).       Radiology Studies: ECHOCARDIOGRAM COMPLETE Result Date: 04/17/2024    ECHOCARDIOGRAM REPORT   Patient Name:   NATAHLIA HOGGARD Date of Exam: 04/17/2024 Medical Rec #:  [id number]        Height:       60.0 in  Accession #:    [id number]       Weight:       209.0 lb Date of Birth:  Dec 27, 1940       BSA:          1.902 m Patient Age:    82 years         BP:           124/51 mmHg Patient Gender: F                HR:           77 bpm. Exam Location:  Inpatient Procedure: 2D Echo, Cardiac Doppler and Color Doppler (Both Spectral and Color            Flow Doppler were utilized during procedure). Indications:    CHF-Acute Diastolic I50.31  History:        Patient has prior history of Echocardiogram examinations, most                 recent 05/18/2023. Arrythmias:Atrial Fibrillation; Risk                 Factors:Hypertension and Diabetes.  Sonographer:    Jayson Gaskins Referring Phys: [id number] TIMOTHY S OPYD IMPRESSIONS  1. Left ventricular ejection fraction, by estimation, is 55 to 60%. The left ventricle has normal function. The left ventricle has no regional wall motion abnormalities. Left ventricular diastolic parameters are consistent with Grade I diastolic dysfunction (impaired relaxation).  2. D-shaped ventricular septum suggestive of RV pressure/volume overload. Right ventricular systolic function is moderately reduced. The right ventricular size is severely enlarged. There is severely elevated pulmonary artery systolic pressure. The estimated right ventricular systolic pressure is 92.8 mmHg.  3. Right atrial size was moderately dilated.  4. The mitral valve is normal in structure. Trivial mitral valve regurgitation. No evidence of mitral stenosis. Moderate mitral annular calcification.  5. The tricuspid valve is abnormal. Tricuspid valve regurgitation is severe. Systolic flow reversal in the hepatic vein doppler pattern.  6. The aortic valve is tricuspid. There is moderate calcification of the aortic valve. Aortic valve regurgitation is not visualized. Aortic valve sclerosis/calcification is present, without any evidence of aortic stenosis.  7. The inferior vena cava is dilated in size with <50% respiratory variability,  suggesting right atrial pressure of 15 mmHg. FINDINGS  Left Ventricle: Left ventricular ejection fraction, by estimation, is 55 to 60%. The left ventricle has normal function. The left ventricle has no regional wall motion abnormalities. The left ventricular internal cavity size was normal in size. There is  no left ventricular hypertrophy. Left ventricular diastolic parameters are consistent with Grade I diastolic dysfunction (impaired relaxation). Right Ventricle: D-shaped ventricular septum suggestive of RV pressure/volume overload.  The right ventricular size is severely enlarged. No increase in right ventricular wall thickness. Right ventricular systolic function is moderately reduced. There is severely elevated pulmonary artery systolic pressure. The tricuspid regurgitant velocity is 4.41 m/s, and with an assumed right atrial pressure of 15 mmHg, the estimated right ventricular systolic pressure is 92.8 mmHg. Left Atrium: Left atrial size was normal in size. Right Atrium: Right atrial size was moderately dilated. Pericardium: Trivial pericardial effusion is present. Mitral Valve: The mitral valve is normal in structure. Moderate mitral annular calcification. Trivial mitral valve regurgitation. No evidence of mitral valve stenosis. Tricuspid Valve: The tricuspid valve is abnormal. Tricuspid valve regurgitation is severe. Aortic Valve: The aortic valve is tricuspid. There is moderate calcification of the aortic valve. Aortic valve regurgitation is not visualized. Aortic valve sclerosis/calcification is present, without any evidence of aortic stenosis. Aortic valve mean gradient measures 4.0 mmHg. Aortic valve peak gradient measures 7.4 mmHg. Aortic valve area, by VTI measures 2.22 cm. Pulmonic Valve: The pulmonic valve was normal in structure. Pulmonic valve regurgitation is not visualized. Aorta: The aortic root is normal in size and structure. Venous: The inferior vena cava is dilated in size with less than  50% respiratory variability, suggesting right atrial pressure of 15 mmHg. IAS/Shunts: No atrial level shunt detected by color flow Doppler.  LEFT VENTRICLE PLAX 2D LVIDd:         4.40 cm   Diastology LVIDs:         2.80 cm   LV e' medial:    10.40 cm/s LV PW:         1.10 cm   LV E/e' medial:  4.7 LV IVS:        0.90 cm   LV e' lateral:   8.81 cm/s LVOT diam:     1.80 cm   LV E/e' lateral: 5.5 LV SV:         51 LV SV Index:   27 LVOT Area:     2.54 cm  RIGHT VENTRICLE             IVC RV Basal diam:  4.80 cm     IVC diam: 2.80 cm RV Mid diam:    4.80 cm RV S prime:     17.50 cm/s TAPSE (M-mode): 2.2 cm LEFT ATRIUM             Index        RIGHT ATRIUM           Index LA Vol (A2C):   52.2 ml 27.45 ml/m  RA Area:     29.60 cm LA Vol (A4C):   28.1 ml 14.78 ml/m  RA Volume:   112.00 ml 58.89 ml/m LA Biplane Vol: 39.6 ml 20.82 ml/m  AORTIC VALVE AV Area (Vmax):    2.19 cm AV Area (Vmean):   2.13 cm AV Area (VTI):     2.22 cm AV Vmax:           136.00 cm/s AV Vmean:          97.800 cm/s AV VTI:            0.229 m AV Peak Grad:      7.4 mmHg AV Mean Grad:      4.0 mmHg LVOT Vmax:         117.00 cm/s LVOT Vmean:        81.700 cm/s LVOT VTI:          0.200 m LVOT/AV VTI ratio:  0.87  AORTA Ao Root diam: 2.70 cm MITRAL VALVE               TRICUSPID VALVE MV Area (PHT): 2.48 cm    TR Peak grad:   77.8 mmHg MV Decel Time: 306 msec    TR Vmax:        441.00 cm/s MV E velocity: 48.70 cm/s MV A velocity: 77.60 cm/s  SHUNTS MV E/A ratio:  0.63        Systemic VTI:  0.20 m                            Systemic Diam: 1.80 cm Dalton McleanMD Electronically signed by Ezra Kanner Signature Date/Time: 04/17/2024/4:09:11 PM    Final         Scheduled Meds:  atorvastatin   20 mg Oral QPM   dofetilide   250 mcg Oral BID   [START ON 04/20/2024] furosemide   40 mg Oral Daily   insulin  aspart  0-5 Units Subcutaneous QHS   insulin  aspart  0-6 Units Subcutaneous TID WC   losartan   50 mg Oral Daily   metoprolol  succinate  50 mg  Oral Daily   Pirfenidone   801 mg Oral TID WC   rivaroxaban   20 mg Oral Q supper   sodium chloride  flush  3 mL Intravenous Q12H   Continuous Infusions:   LOS: 2 days    Time spent: 50 minutes spent on 04/19/2024 caring for this patient face-to-face including chart review, ordering labs/tests, documenting, discussion with nursing staff, consultants, updating family and interview/physical exam    Camellia PARAS [place], DO [facility] Available via Epic secure chat 7am-7pm After these hours, please refer to coverage provider listed on [email or web address] 04/19/2024, 11:35 AM

## 2024-04-19 NOTE — Progress Notes (Signed)
 Dr. [place] made aware blood pressure remains soft upper 80's.

## 2024-04-19 NOTE — Progress Notes (Signed)
 Progress Note  Patient Name: Carrie Haynes Date of Encounter: 04/19/2024  Primary Cardiologist: Jerel Balding, MD  Subjective   No acute events overnight.  In NSR.  Doing better.  SOB improved.  Leg swelling completely resolved.  Inpatient Medications    Scheduled Meds:  atorvastatin   20 mg Oral QPM   dofetilide   250 mcg Oral BID   [START ON 04/20/2024] furosemide   40 mg Oral Daily   insulin  aspart  0-5 Units Subcutaneous QHS   insulin  aspart  0-6 Units Subcutaneous TID WC   losartan   50 mg Oral Daily   metoprolol  succinate  50 mg Oral Daily   Pirfenidone   801 mg Oral TID WC   rivaroxaban   20 mg Oral Q supper   sodium chloride  flush  3 mL Intravenous Q12H   Continuous Infusions:  PRN Meds: acetaminophen  **OR** acetaminophen , oxyCODONE , senna-docusate   Vital Signs    Vitals:   04/18/24 1957 04/19/24 0100 04/19/24 0445 04/19/24 0714  BP: (!) 99/56 122/68 (!) 96/57 101/74  Pulse: 70 75 77   Resp: 16 20 20  (!) 22  Temp: 98.4 F (36.9 C) 97.6 F (36.4 C) 97.9 F (36.6 C) 97.7 F (36.5 C)  TempSrc: Oral Oral Oral Oral  SpO2: 92% 94% 93% 95%  Weight:   96.9 kg   Height:        Intake/Output Summary (Last 24 hours) at 04/19/2024 1034 Last data filed at 04/19/2024 0448 Gross per 24 hour  Intake 480 ml  Output 2850 ml  Net -2370 ml   Filed Weights   04/16/24 1425 04/18/24 0506 04/19/24 0445  Weight: 94.8 kg 96.9 kg 96.9 kg    Telemetry     Personally reviewed.  Normal sinus rhythm.  ECG    Not performed today.  Physical Exam   GEN: No acute distress.   Neck: No JVD. Cardiac: RRR, no murmur, rub, or gallop.  Respiratory: Nonlabored.  Bilateral coarse Rales GI: Soft, nontender, bowel sounds present. MS: No edema; No deformity.  Tender and erythematous lower extremities. Neuro:  Nonfocal. Psych: Alert and oriented x 3. Normal affect.  Labs    Chemistry Recent Labs  Lab 04/16/24 1459 04/17/24 0530 04/18/24 0228 04/19/24 0213  NA 130* 133*  130* 129*  K 5.1 4.8 4.3 4.0  CL 96* 97* 92* 90*  CO2 24 26 29 29   GLUCOSE 113* 167* 143* 108*  BUN 23 29* 25* 27*  CREATININE 1.01* 1.06* 1.18* 1.26*  CALCIUM  8.7* 8.7* 8.5* 8.5*  PROT 6.6  --   --   --   ALBUMIN 2.9*  --   --   --   AST 32  --   --   --   ALT 20  --   --   --   ALKPHOS 71  --   --   --   BILITOT 0.8  --   --   --   GFRNONAA 56* 53* 47* 43*  ANIONGAP 10 10 9 10      Hematology Recent Labs  Lab 04/16/24 1459 04/17/24 0530  WBC 5.1 5.7  RBC 3.72* 3.50*  HGB 10.7* 10.1*  HCT 34.9* 32.4*  MCV 93.8 92.6  MCH 28.8 28.9  MCHC 30.7 31.2  RDW 15.5 15.5  PLT 173 180    Cardiac Enzymes Recent Labs  Lab 04/16/24 1459 04/16/24 1755  TROPONINIHS 11 12    BNP Recent Labs  Lab 04/16/24 1459 04/18/24 0228  BNP 532.1* 466.1*  DDimerNo results for input(s): DDIMER in the last 168 hours.    Assessment & Plan   Acute on chronic diastolic heart failure/cor pulmonale: SOB improved and leg swelling resolved completely.  Currently on IV Lasix  40 mg twice daily, made 2.8L urine output in the last 24 hours.  Serum creatinine slowly trending up, 1.01 on admission and currently 1.26. Switch IV Lasix  to p.o. Lasix  40 mg once daily starting tomorrow (she received IV Lasix  40 mg dose this morning).  Paroxysmal A-fib: In normal sinus rhythm on telemetry.  Currently on Tikosyn  500 mcg twice daily.  Due to creatinine clearance 37, will decrease the dose to 250 mcg twice daily.  This will be the appropriate dose for her upon discharge, curbsided with EP.  Continue metoprolol  succinate 50 mg once daily and Xarelto  20 mg daily with supper.  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  Follows with pulmonary.   Signed, Diannah SHAUNNA [place], MD  04/19/2024, 10:34 AM

## 2024-04-19 NOTE — Progress Notes (Signed)
 MD Dr. [place] notified of patient's blood pressure sof in the 80's systolic.

## 2024-04-19 NOTE — Plan of Care (Signed)
 Patient feeling better and edema decreased

## 2024-04-20 DIAGNOSIS — I5033 Acute on chronic diastolic (congestive) heart failure: Secondary | ICD-10-CM | POA: Diagnosis not present

## 2024-04-20 DIAGNOSIS — I272 Pulmonary hypertension, unspecified: Secondary | ICD-10-CM | POA: Diagnosis not present

## 2024-04-20 DIAGNOSIS — I4819 Other persistent atrial fibrillation: Secondary | ICD-10-CM | POA: Diagnosis not present

## 2024-04-20 DIAGNOSIS — I50810 Right heart failure, unspecified: Secondary | ICD-10-CM | POA: Diagnosis not present

## 2024-04-20 LAB — BASIC METABOLIC PANEL WITH GFR
Anion gap: 7 (ref 5–15)
BUN: 29 mg/dL — ABNORMAL HIGH (ref 8–23)
CO2: 28 mmol/L (ref 22–32)
Calcium: 8.1 mg/dL — ABNORMAL LOW (ref 8.9–10.3)
Chloride: 93 mmol/L — ABNORMAL LOW (ref 98–111)
Creatinine, Ser: 1.08 mg/dL — ABNORMAL HIGH (ref 0.44–1.00)
GFR, Estimated: 52 mL/min — ABNORMAL LOW (ref >60)
Glucose, Bld: 107 mg/dL — ABNORMAL HIGH (ref 70–99)
Potassium: 4.2 mmol/L (ref 3.5–5.1)
Sodium: 128 mmol/L — ABNORMAL LOW (ref 135–145)

## 2024-04-20 LAB — MAGNESIUM: Magnesium: 1.9 mg/dL (ref 1.7–2.4)

## 2024-04-20 LAB — GLUCOSE, CAPILLARY
Glucose-Capillary: 139 mg/dL — ABNORMAL HIGH (ref 70–99)
Glucose-Capillary: 141 mg/dL — ABNORMAL HIGH (ref 70–99)
Glucose-Capillary: 162 mg/dL — ABNORMAL HIGH (ref 70–99)
Glucose-Capillary: 181 mg/dL — ABNORMAL HIGH (ref 70–99)

## 2024-04-20 MED ORDER — ONDANSETRON HCL 4 MG/2ML IJ SOLN: 4.0000 mg | Freq: Four times a day (QID) | INTRAMUSCULAR | Status: DC | PRN

## 2024-04-20 MED ORDER — TRIMETHOBENZAMIDE HCL 100 MG/ML IM SOLN
200.0000 mg | Freq: Four times a day (QID) | INTRAMUSCULAR | Status: DC | PRN
  Filled 2024-04-20 (×3): qty 2

## 2024-04-20 NOTE — Progress Notes (Signed)
 PROGRESS NOTE    Carrie Haynes  FMW:[id number] DOB: [birth year] DOA: 04/16/2024 PCP: Teresa Channel, MD    Brief Narrative:   Carrie Haynes is a 83 y.o. female with past medical history significant for chronic hypoxic respiratory failure/interstitial lung disease on 4 L nasal cannula at baseline, DM2, HTN, paroxysmal atrial fibrillation on Xarelto , pulmonary HTN who presented to [facility] ED on 04/16/2024 with progressive shortness of breath, lower extremity edema.  Additionally reports decreased activity tolerance.  Cough mainly nonproductive.  Denies fever, no chest pain.  In the ED, temperature 98.1 F, HR 67, RR 18, BP 130/104, SpO2 100% on 4 L nasal cannula.  WBC 5.1, hemoglobin 10.7, platelet count 173.  Sodium 130, potassium 5.1, chloride 96, CO2 24, glucose 113, BUN 23, creat 1.01.  AST 32, ALT 20, 2 bilirubin 0.8.  BNP 532.1.  High sensitive troponin 11> 12.  Chest x-ray with no acute cardiopulmonary disease process.  Chronic interstitial thickening consistent with interstitial lung disease, stable cardiomegaly.  EKG with NSR, first-degree AV block, rate 70, QTc 442.  Patient was given furosemide  80 mg IV x 1 in the ED.  TRH consulted for admission for further evaluation management of diastolic CHF exacerbation.  Assessment & Plan:   Acute on chronic diastolic congestive heart failure Patient presenting with progressive dyspnea, lower extremity edema.  BNP elevated 532.1 with 2+ lower extremity edema.  Previous TTE 05/18/2023 with LVEF 55-60%, LV with no regional wall motion abnormalities, grade 1 diastolic dysfunction, RV mildly enlarged, RA mildly dilated, no aortic stenosis, IVC normal in size.  Follows with cardiology, Dr. Bette outpatient.  Repeat TTE 7/2 with LVEF 55 to 60%, no LV regional wall motion abnormalities, grade 1 diastolic dysfunction, IVC dilated. -- Cardiology following, appreciate assistance -- net negative 1.1L past 24h and net negative 7.9L since admission --  Furosemide  transitioned to 40 mg p.o. daily today -- Strict I's and O's and daily weights -- BMP daily  Hyponatremia Sodium 130 on admission, likely secondary to hypervolemic hyponatremia in the setting of volume overload. -- Na 130>133>130>129>128 -- Continue IV diuresis as above -- BMP daily  Hypomagnesemia Repleted magnesium  1.9 this morning -- Repeat electrolytes in the a.m.  Chronic hypoxic respiratory failure/interstitial lung disease, oxygen  dependent Follows with pulmonology outpatient, Dr. Theophilus. Dependent on 4 L nasal cannula at baseline. -- Continue supplemental oxygen , currently on 4 L nasal cannula   DM2 On metformin 500 mg p.o. daily at baseline. -- Hold oral hypoglycemics while inpatient -- Very sensitive SSI for coverage -- CBG before every meal/at bedtime  Paroxysmal atrial fibrillation on anticoagulation HTN -- Tikosyn  reduced to 250 mcg p.o. twice daily by cardiology -- Metoprolol  succinate reduced to 25 mg p.o. daily -- Losartan  discontinued due to hypotension -- Lasix  40 mg p.o. daily -- Xarelto  20 mg p.o. daily  Pulmonary hypertension -- Pifenidone 801mg  PO TID  HLD -- Atorvastatin  20 g p.o. daily  Weakness/debility/deconditioning/gait disturbance: Patient currently resides at home, utilizes a rollator for ambulation. -- PT/OT recommending home health   DVT prophylaxis:  rivaroxaban  (XARELTO ) tablet 20 mg    Code Status: Full Code Family Communication: Updated family present at bedside this morning  Disposition Plan:  Level of care: Telemetry Cardiac Status is: Inpatient Remains inpatient appropriate because: Cardiology adjusting medications, anticipate likely discharge home in 1-2 days    Consultants:  Cardiology  Procedures:  TTE:   Antimicrobials:  None   Subjective: Patient seen examined bedside, lying in bed.  Family present at  bedside.  Patient became hypotensive yesterday, likely overdiuresis and was given IV fluid bolus  with improvement.  Losartan  was discontinued and metoprolol  dose reduced by cardiology this morning.  Starting on oral Lasix  today.  Remains on her 4 L nasal cannula which is baseline.  No complaints or concerns at this time.    Denies headache, no dizziness, no chest pain, no palpitations, no fever/chills/night sweats, no nausea/vomiting/diarrhea, no focal weakness, no fatigue, no paresthesias. No acute events overnight per nursing staff.  Objective: Vitals:   04/20/24 0520 04/20/24 0800 04/20/24 0858 04/20/24 0900  BP: (!) 102/53 (!) 102/54  109/66  Pulse: 80 70 77 72  Resp: 20 17 16 19   Temp: 98.3 F (36.8 C) 97.8 F (36.6 C)    TempSrc: Oral Oral    SpO2: 94% 95% 95% 96%  Weight: 95.6 kg     Height:        Intake/Output Summary (Last 24 hours) at 04/20/2024 1103 Last data filed at 04/20/2024 0527 Gross per 24 hour  Intake 250 ml  Output 1350 ml  Net -1100 ml   Filed Weights   04/18/24 0506 04/19/24 0445 04/20/24 0520  Weight: 96.9 kg 96.9 kg 95.6 kg    Examination:  Physical Exam: GEN: NAD, alert and oriented x 3, chronically ill appearance HEENT: NCAT, PERRL, EOMI, sclera clear, MMM PULM: CTAB w/o w/r/r, normal respiratory effort without accessory muscle use, on 4 L nasal cannula with SpO2 90% (baseline 4L E. Lopez) CV: RRR w/o M/G/R GI: abd soft, NTND, NABS, no R/G/M MSK: 1+ bilateral lower extremity edema to mid shin with chronic venous changes, moves all extremity independently with preserved muscle strength NEURO: CN II-XII intact, no focal deficits, sensation to light touch intact PSYCH: normal mood/affect Integumentary: Chronic venous changes bilateral lower extremities, otherwise no other concerning rashes/lesions/wounds noted on exposed skin surfaces    Data Reviewed: I have personally reviewed following labs and imaging studies  CBC: Recent Labs  Lab 04/16/24 1459 04/17/24 0530  WBC 5.1 5.7  NEUTROABS 3.7  --   HGB 10.7* 10.1*  HCT 34.9* 32.4*  MCV 93.8 92.6   PLT 173 180   Basic Metabolic Panel: Recent Labs  Lab 04/16/24 1459 04/17/24 0530 04/18/24 0228 04/19/24 0213 04/20/24 0251  NA 130* 133* 130* 129* 128*  K 5.1 4.8 4.3 4.0 4.2  CL 96* 97* 92* 90* 93*  CO2 24 26 29 29 28   GLUCOSE 113* 167* 143* 108* 107*  BUN 23 29* 25* 27* 29*  CREATININE 1.01* 1.06* 1.18* 1.26* 1.08*  CALCIUM  8.7* 8.7* 8.5* 8.5* 8.1*  MG  --  1.9 1.6* 2.2 1.9   GFR: Estimated Creatinine Clearance: 43 mL/min (A) (by C-G formula based on SCr of 1.08 mg/dL (H)). Liver Function Tests: Recent Labs  Lab 04/16/24 1459  AST 32  ALT 20  ALKPHOS 71  BILITOT 0.8  PROT 6.6  ALBUMIN 2.9*   No results for input(s): LIPASE, AMYLASE in the last 168 hours. No results for input(s): AMMONIA in the last 168 hours. Coagulation Profile: No results for input(s): INR, PROTIME in the last 168 hours. Cardiac Enzymes: No results for input(s): CKTOTAL, CKMB, CKMBINDEX, TROPONINI in the last 168 hours. BNP (last 3 results) Recent Labs    04/27/23 1648 11/21/23 1607  PROBNP 858* 323.0*   HbA1C: No results for input(s): HGBA1C in the last 72 hours.  CBG: Recent Labs  Lab 04/19/24 0623 04/19/24 1128 04/19/24 1531 04/19/24 2108 04/20/24 0622  GLUCAP 130* 222*  175* 226* 139*   Lipid Profile: Recent Labs    04/19/24 0213  CHOL 95  HDL 36*  LDLCALC 47  TRIG 60  CHOLHDL 2.6   Thyroid  Function Tests: No results for input(s): TSH, T4TOTAL, FREET4, T3FREE, THYROIDAB in the last 72 hours. Anemia Panel: No results for input(s): VITAMINB12, FOLATE, FERRITIN, TIBC, IRON, RETICCTPCT in the last 72 hours. Sepsis Labs: No results for input(s): PROCALCITON, LATICACIDVEN in the last 168 hours.  No results found for this or any previous visit (from the past 240 hours).       Radiology Studies: No results found.       Scheduled Meds:  atorvastatin   20 mg Oral QPM   dofetilide   250 mcg Oral BID   furosemide   40  mg Oral Daily   insulin  aspart  0-5 Units Subcutaneous QHS   insulin  aspart  0-6 Units Subcutaneous TID WC   metoprolol  succinate  25 mg Oral Daily   Pirfenidone   801 mg Oral TID WC   rivaroxaban   20 mg Oral Q supper   sodium chloride  flush  3 mL Intravenous Q12H   Continuous Infusions:   LOS: 3 days    Time spent: 50 minutes spent on 04/20/2024 caring for this patient face-to-face including chart review, ordering labs/tests, documenting, discussion with nursing staff, consultants, updating family and interview/physical exam    Camellia PARAS [place], DO [facility] Available via Epic secure chat 7am-7pm After these hours, please refer to coverage provider listed on [email or web address] 04/20/2024, 11:03 AM

## 2024-04-20 NOTE — TOC Initial Note (Signed)
 Transition of Care [facility]) - Initial/Assessment Note    Patient Details  Name: Carrie Haynes MRN: [id number] Date of Birth: [birth year]  Transition of [facility] CM/SW Contact:    Marval Gell, RN Phone Number: 04/20/2024, 11:47 AM  Clinical Narrative:                  Met with patient and daughter at bedside. Patient states that she does not want HH services. She has home oxygen , and rollator, would like BSC, this will be delivered to the room by [employer]        Patient Goals and CMS Choice            Expected Discharge Plan and Services   Discharge Planning Services: CM Consult                     DME Arranged: Bedside commode DME Agency: [employer] Date DME Agency Contacted: 04/20/24 Time DME Agency Contacted: 1147 Representative spoke with at DME Agency: [place] HH Arranged: Patient Refused HH          Prior Living Arrangements/Services                       Activities of Daily Living   ADL Screening (condition at time of admission) Independently performs ADLs?: No Does the patient have a NEW difficulty with bathing/dressing/toileting/self-feeding that is expected to last >3 days?: Yes (Initiates electronic notice to provider for possible OT consult) Does the patient have a NEW difficulty with getting in/out of bed, walking, or climbing stairs that is expected to last >3 days?: Yes (Initiates electronic notice to provider for possible PT consult) Does the patient have a NEW difficulty with communication that is expected to last >3 days?: Yes (Initiates electronic notice to provider for possible SLP consult) Is the patient deaf or have difficulty hearing?: No Does the patient have difficulty seeing, even when wearing glasses/contacts?: No Does the patient have difficulty concentrating, remembering, or making decisions?: No  Permission Sought/Granted                  Emotional Assessment              Admission diagnosis:   Peripheral edema [R60.0] Dyspnea on exertion [R06.09] Acute on chronic heart failure with preserved ejection fraction (HFpEF) (HCC) [I50.33] Patient Active Problem List   Diagnosis Date Noted   RVF (right ventricular failure) (HCC) 04/18/2024   Acute on chronic heart failure with preserved ejection fraction (HFpEF) (HCC) 04/16/2024   Pulmonary hypertension (HCC) 04/16/2024   Hyponatremia 04/16/2024   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   IDA (iron deficiency anemia) 11/12/2020   Chronic respiratory failure with hypoxia (HCC) 09/09/2020   Hypertension    Hyperlipemia    Diabetes mellitus without complication (HCC)    Symptomatic anemia 07/30/2020   Secondary hypercoagulable state (HCC) 09/16/2019   Upper airway cough syndrome 07/13/2017   Morbid obesity due to excess calories (HCC) 07/13/2017   DCM (dilated cardiomyopathy) (HCC)    Atrial fibrillation with RVR (HCC)    Asystole (HCC)    Hypotension    Acute on chronic combined systolic and diastolic CHF (congestive heart failure) (HCC)    DOE (dyspnea on exertion) 05/12/2017   A-fib (HCC) 05/10/2017   PCP:  Teresa Channel, MD Pharmacy:   [id number] - [place], [place] - [address] [place]  [address] Phone: [phone] Fax: [phone]  [facility] - [place], [place] - [address] [place] [address] Phone: [phone] Fax: [phone]  [facility] - [place], [place] - [address] [facility] [address] [place] [address] Phone: [phone] Fax: [phone]     Social Drivers of Health (SDOH) Social History: SDOH Screenings   Food Insecurity: Patient Declined (04/17/2024)  Housing: Patient Declined (04/17/2024)  Transportation Needs: Patient Declined (04/17/2024)  Utilities: Patient Declined (04/17/2024)  Social Connections: Patient Declined (04/17/2024)  Tobacco Use: Low  Risk  (04/17/2024)   SDOH Interventions:     Readmission Risk Interventions    04/17/2024    4:26 PM  Readmission Risk Prevention Plan  Transportation Screening Complete  PCP or Specialist Appt within 5-7 Days Complete  Home Care Screening Complete  Medication Review (RN CM) Complete

## 2024-04-20 NOTE — Progress Notes (Signed)
 Progress Note  Patient Name: Carrie Haynes Date of Encounter: 04/20/2024  Primary Cardiologist: Jerel Balding, MD  Subjective   BP 109/66.  Sodium 128.  Creatinine improved (1.26 > 1.08).  Net -1.1 L yesterday, -7.9 L on admission.  Inpatient Medications    Scheduled Meds:  atorvastatin   20 mg Oral QPM   dofetilide   250 mcg Oral BID   furosemide   40 mg Oral Daily   insulin  aspart  0-5 Units Subcutaneous QHS   insulin  aspart  0-6 Units Subcutaneous TID WC   metoprolol  succinate  25 mg Oral Daily   Pirfenidone   801 mg Oral TID WC   rivaroxaban   20 mg Oral Q supper   sodium chloride  flush  3 mL Intravenous Q12H   Continuous Infusions:  PRN Meds: acetaminophen  **OR** acetaminophen , oxyCODONE , senna-docusate   Vital Signs    Vitals:   04/20/24 0520 04/20/24 0800 04/20/24 0858 04/20/24 0900  BP: (!) 102/53 (!) 102/54  109/66  Pulse: 80 70 77 72  Resp: 20 17 16 19   Temp: 98.3 F (36.8 C) 97.8 F (36.6 C)    TempSrc: Oral Oral    SpO2: 94% 95% 95% 96%  Weight: 95.6 kg     Height:        Intake/Output Summary (Last 24 hours) at 04/20/2024 1026 Last data filed at 04/20/2024 0527 Gross per 24 hour  Intake 250 ml  Output 1350 ml  Net -1100 ml   Filed Weights   04/18/24 0506 04/19/24 0445 04/20/24 0520  Weight: 96.9 kg 96.9 kg 95.6 kg    Telemetry     Personally reviewed.  Normal sinus rhythm.  ECG    Not performed today.  Physical Exam   GEN: No acute distress.   Neck: No JVD. Cardiac: RRR, no murmur, rub, or gallop.  Respiratory: Nonlabored.  Bilateral coarse Rales GI: Soft, nontender, bowel sounds present. MS: No edema; No deformity.  Tender and erythematous lower extremities. Neuro:  Nonfocal. Psych: Alert and oriented x 3. Normal affect.  Labs    Chemistry Recent Labs  Lab 04/16/24 1459 04/17/24 0530 04/18/24 0228 04/19/24 0213 04/20/24 0251  NA 130*   < > 130* 129* 128*  K 5.1   < > 4.3 4.0 4.2  CL 96*   < > 92* 90* 93*  CO2 24   <  > 29 29 28   GLUCOSE 113*   < > 143* 108* 107*  BUN 23   < > 25* 27* 29*  CREATININE 1.01*   < > 1.18* 1.26* 1.08*  CALCIUM  8.7*   < > 8.5* 8.5* 8.1*  PROT 6.6  --   --   --   --   ALBUMIN 2.9*  --   --   --   --   AST 32  --   --   --   --   ALT 20  --   --   --   --   ALKPHOS 71  --   --   --   --   BILITOT 0.8  --   --   --   --   GFRNONAA 56*   < > 47* 43* 52*  ANIONGAP 10   < > 9 10 7    < > = values in this interval not displayed.     Hematology Recent Labs  Lab 04/16/24 1459 04/17/24 0530  WBC 5.1 5.7  RBC 3.72* 3.50*  HGB 10.7* 10.1*  HCT 34.9* 32.4*  MCV 93.8 92.6  MCH 28.8 28.9  MCHC 30.7 31.2  RDW 15.5 15.5  PLT 173 180    Cardiac Enzymes Recent Labs  Lab 04/16/24 1459 04/16/24 1755  TROPONINIHS 11 12    BNP Recent Labs  Lab 04/16/24 1459 04/18/24 0228  BNP 532.1* 466.1*     DDimerNo results for input(s): DDIMER in the last 168 hours.    Assessment & Plan   Acute on chronic diastolic heart failure/cor pulmonale: SOB improved and leg swelling resolved completely.  Diuresed well with IV Lasix , creatinine trending up and hypotensive yesterday.  Switch to p.o. Lasix  today   Paroxysmal A-fib: In normal sinus rhythm on telemetry.  Has been on Tikosyn  500 mcg twice daily.  Due to creatinine clearance 37, will decrease the dose to 250 mcg twice daily.  This will be the appropriate dose for her upon discharge, curbsided with EP.  Continue metoprolol  succinate 50 mg once daily and Xarelto  20 mg daily with supper.   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  Follows with pulmonary.  Hypertension: BP has been soft, will hold losartan  and reduce toprol  XL to 25 mg daily   Signed, Lonni LITTIE [place], MD  04/20/2024, 10:26 AM

## 2024-04-21 ENCOUNTER — Inpatient Hospital Stay (HOSPITAL_COMMUNITY)

## 2024-04-21 DIAGNOSIS — I272 Pulmonary hypertension, unspecified: Secondary | ICD-10-CM | POA: Diagnosis not present

## 2024-04-21 DIAGNOSIS — I5033 Acute on chronic diastolic (congestive) heart failure: Secondary | ICD-10-CM | POA: Diagnosis not present

## 2024-04-21 DIAGNOSIS — I4819 Other persistent atrial fibrillation: Secondary | ICD-10-CM | POA: Diagnosis not present

## 2024-04-21 LAB — GLUCOSE, CAPILLARY
Glucose-Capillary: 108 mg/dL — ABNORMAL HIGH (ref 70–99)
Glucose-Capillary: 152 mg/dL — ABNORMAL HIGH (ref 70–99)
Glucose-Capillary: 198 mg/dL — ABNORMAL HIGH (ref 70–99)
Glucose-Capillary: 132 mg/dL — ABNORMAL HIGH (ref 70–99)

## 2024-04-21 LAB — BASIC METABOLIC PANEL WITH GFR
Anion gap: 11 (ref 5–15)
BUN: 23 mg/dL (ref 8–23)
CO2: 29 mmol/L (ref 22–32)
Calcium: 8.4 mg/dL — ABNORMAL LOW (ref 8.9–10.3)
Chloride: 88 mmol/L — ABNORMAL LOW (ref 98–111)
Creatinine, Ser: 0.86 mg/dL (ref 0.44–1.00)
GFR, Estimated: >60 mL/min (ref >60)
Glucose, Bld: 109 mg/dL — ABNORMAL HIGH (ref 70–99)
Potassium: 4 mmol/L (ref 3.5–5.1)
Sodium: 128 mmol/L — ABNORMAL LOW (ref 135–145)

## 2024-04-21 LAB — MAGNESIUM: Magnesium: 1.7 mg/dL (ref 1.7–2.4)

## 2024-04-21 MED ORDER — DOFETILIDE 250 MCG PO CAPS
500.0000 ug | ORAL_CAPSULE | Freq: Two times a day (BID) | ORAL | Status: DC
  Administered 2024-04-21 – 2024-04-22 (×2): 500 ug via ORAL
  Filled 2024-04-21 (×2): qty 2

## 2024-04-21 MED ORDER — MAGNESIUM SULFATE 2 GM/50ML IV SOLN
2.0000 g | Freq: Once | INTRAVENOUS | Status: AC
  Administered 2024-04-21: 2 g via INTRAVENOUS
  Filled 2024-04-21: qty 50

## 2024-04-21 NOTE — Evaluation (Signed)
 Clinical/Bedside Swallow Evaluation Patient Details  Name: Carrie Haynes MRN: [id number] Date of Birth: 07/03/1941  Today's Date: 04/21/2024 Time: SLP Start Time (ACUTE ONLY): 1220 SLP Stop Time (ACUTE ONLY): 1245 SLP Time Calculation (min) (ACUTE ONLY): 25 min  Past Medical History:  Past Medical History:  Diagnosis Date   A-fib (HCC)    Diabetes mellitus without complication (HCC)    Hyperlipemia    Hypertension    Past Surgical History:  Past Surgical History:  Procedure Laterality Date   CARDIOVERSION N/A 05/18/2017   Procedure: CARDIOVERSION;  Surgeon: Delford Maude BROCKS, MD;  Location: [facility] ENDOSCOPY;  Service: Cardiovascular;  Laterality: N/A;   CARDIOVERSION N/A 05/23/2017   Procedure: CARDIOVERSION;  Surgeon: Shlomo Wilbert SAUNDERS, MD;  Location: [facility] ENDOSCOPY;  Service: Cardiovascular;  Laterality: N/A;   CARDIOVERSION N/A 08/28/2019   Procedure: CARDIOVERSION;  Surgeon: Jeffrie Oneil BROCKS, MD;  Location: [facility] ENDOSCOPY;  Service: Cardiovascular;  Laterality: N/A;   COLONOSCOPY N/A 08/07/2020   Procedure: COLONOSCOPY;  Surgeon: Burnette Fallow, MD;  Location: [facility] ENDOSCOPY;  Service: Endoscopy;  Laterality: N/A;   ESOPHAGOGASTRODUODENOSCOPY N/A 08/07/2020   Procedure: ESOPHAGOGASTRODUODENOSCOPY (EGD);  Surgeon: Burnette Fallow, MD;  Location: [facility] ENDOSCOPY;  Service: Endoscopy;  Laterality: N/A;   GIVENS CAPSULE STUDY N/A 08/09/2020   Procedure: GIVENS CAPSULE STUDY;  Surgeon: Burnette Fallow, MD;  Location: [facility] ENDOSCOPY;  Service: Endoscopy;  Laterality: N/A;   RIGHT HEART CATH N/A 06/23/2023   Procedure: RIGHT HEART CATH;  Surgeon: Cherrie Toribio SAUNDERS, MD;  Location: [facility] INVASIVE CV LAB;  Service: Cardiovascular;  Laterality: N/A;   RIGHT/LEFT HEART CATH AND CORONARY ANGIOGRAPHY N/A 05/12/2017   Procedure: Right/Left Heart Cath and Coronary Angiography;  Surgeon: Claudene Victory ORN, MD;  Location: [facility] INVASIVE CV LAB;  Service: Cardiovascular;  Laterality: N/A;   TEE WITHOUT CARDIOVERSION N/A  05/18/2017   Procedure: TRANSESOPHAGEAL ECHOCARDIOGRAM (TEE);  Surgeon: Delford Maude BROCKS, MD;  Location: [facility] ENDOSCOPY;  Service: Cardiovascular;  Laterality: N/A;   HPI:  Pt is an 83 y.o. female who presented with bilateral lower extremity edema and worsening shortness of breath. CXR 7/1 negative for acute changes. Per referring MD's note, While drinking water this morning, patient had an aspiration event desaturating down to the 70s with slow recovery. PMH: interstitial lung disease, chronic hypoxic respiratory failure, type 2 diabetes mellitus, hypertension, atrial fibrillation on Xarelto , and pulmonary hypertension.    Assessment / Plan / Recommendation  Clinical Impression  Pt was seen for bedside swallow evaluation with her daughter present. Both parties reported that the pt has been having significant, but intermittent, episodes of coughing with p.o. intake for over five years. Pt stated that symptoms have been sporadic with a frequency of once/twice a day or once a week and with no consistency with regards to a specific liquid/texture of foods. 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 There has reportedly been an episode this morning with water when pt's O2 dropped to 70% and the pt turned blue. Oral mechanism exam was [facility] and she presented with adequate, natural dentition. Pt exhibited throat clearing with thin liquids via straw, but no other symptoms of oropharyngeal dysphagia were observed. were noted. Still, considering the severity of pt's symptoms, a modified barium swallow study is recommended to further investigate the structural and functional integrity of the swallow mechanism; pt expressed that she would prefer to rest today and  defer the test to tomorrow if possible. Pt's current diet of regular texture solids and thin liquids will be continued,  but with strict observance of swallowing precautions which have been with discussed with the pt, daughter, and RN. SLP Visit Diagnosis: Dysphagia, unspecified (R13.10)    Aspiration Risk  Mild aspiration risk    Diet Recommendation Regular;Thin liquid    Liquid Administration via: Cup;No straw Medication Administration: Whole meds with puree Supervision: Patient able to self feed Compensations: Slow rate;Small sips/bites Postural Changes: Seated upright at 90 degrees    Other  Recommendations Oral Care Recommendations: Oral care BID     Assistance Recommended at Discharge    Functional Status Assessment Patient has had a recent decline in their functional status and demonstrates the ability to make significant improvements in function in a reasonable and predictable amount of time.  Frequency and Duration min 2x/week  2 weeks       Prognosis Prognosis for improved oropharyngeal function: Good      Swallow Study   General Date of Onset: 04/22/19 HPI: Pt is an 83 y.o. female who presented with bilateral lower extremity edema and worsening shortness of breath. CXR 7/1 negative for acute changes. Per referring MD's note, While drinking water this morning, patient had an aspiration event desaturating down to the 70s with slow recovery. PMH: interstitial lung disease, chronic hypoxic respiratory failure, type 2 diabetes mellitus, hypertension, atrial fibrillation on Xarelto , and pulmonary hypertension. Type of Study: Bedside Swallow Evaluation Previous Swallow Assessment: none Diet Prior to this Study: Regular;Thin liquids (Level 0) Temperature Spikes Noted: No Respiratory Status: Room air History of Recent Intubation: No Behavior/Cognition: Alert;Cooperative;Pleasant mood Oral Cavity Assessment: Within Functional Limits Oral Care Completed by SLP: No Oral Cavity - Dentition: Adequate natural dentition Vision: Functional for self-feeding Self-Feeding Abilities: Able to feed  self Patient Positioning: Upright in bed Baseline Vocal Quality: Normal Volitional Cough: Strong Volitional Swallow: Able to elicit    Oral/Motor/Sensory Function Overall Oral Motor/Sensory Function: Within functional limits   Ice Chips Ice chips: Not tested   Thin Liquid Thin Liquid: Impaired Presentation: Straw;Cup Pharyngeal  Phase Impairments: Throat Clearing - Delayed;Throat Clearing - Immediate (via straw)    Nectar Thick Nectar Thick Liquid: Not tested   Honey Thick Honey Thick Liquid: Not tested   Puree Puree: Within functional limits Presentation: Spoon   Solid     Solid: Within functional limits Presentation: Self Fed     Phong Isenberg I. [place], MS, CCC-SLP Acute Rehabilitation Services Office number [phone]  Thea LILLETTE [place] 04/21/2024,1:26 PM

## 2024-04-21 NOTE — Progress Notes (Signed)
 PROGRESS NOTE    Carrie Haynes  FMW:[id number] DOB: 1941-07-08 DOA: 04/16/2024 PCP: Teresa Channel, MD    Brief Narrative:   Carrie Haynes is a 83 y.o. female with past medical history significant for chronic hypoxic respiratory failure/interstitial lung disease on 4 L nasal cannula at baseline, DM2, HTN, paroxysmal atrial fibrillation on Xarelto , pulmonary HTN who presented to [facility] ED on 04/16/2024 with progressive shortness of breath, lower extremity edema.  Additionally reports decreased activity tolerance.  Cough mainly nonproductive.  Denies fever, no chest pain.  In the ED, temperature 98.1 F, HR 67, RR 18, BP 130/104, SpO2 100% on 4 L nasal cannula.  WBC 5.1, hemoglobin 10.7, platelet count 173.  Sodium 130, potassium 5.1, chloride 96, CO2 24, glucose 113, BUN 23, creat 1.01.  AST 32, ALT 20, 2 bilirubin 0.8.  BNP 532.1.  High sensitive troponin 11> 12.  Chest x-ray with no acute cardiopulmonary disease process.  Chronic interstitial thickening consistent with interstitial lung disease, stable cardiomegaly.  EKG with NSR, first-degree AV block, rate 70, QTc 442.  Patient was given furosemide  80 mg IV x 1 in the ED.  TRH consulted for admission for further evaluation management of diastolic CHF exacerbation.  Assessment & Plan:   Acute on chronic diastolic congestive heart failure Patient presenting with progressive dyspnea, lower extremity edema.  BNP elevated 532.1 with 2+ lower extremity edema.  Previous TTE 05/18/2023 with LVEF 55-60%, LV with no regional wall motion abnormalities, grade 1 diastolic dysfunction, RV mildly enlarged, RA mildly dilated, no aortic stenosis, IVC normal in size.  Follows with cardiology, Dr. Bette outpatient.  Repeat TTE 7/2 with LVEF 55 to 60%, no LV regional wall motion abnormalities, grade 1 diastolic dysfunction, IVC dilated. -- Cardiology following, appreciate assistance -- net negative past 24h and net negative 8.6L since admission --  Furosemide  40 mg p.o. daily -- Strict I's and O's and daily weights -- BMP daily  Aspiration/dysphagia While drinking water this morning, patient had an aspiration event desaturating down to the 70s with slow recovery. -- Check chest x-ray -- SLP evaluation -- Aspiration precautions  Hyponatremia Sodium 130 on admission, likely secondary to hypervolemic hyponatremia in the setting of volume overload. -- Na 130>133>130>129>128>128 -- Continue IV diuresis as above -- BMP daily  Hypomagnesemia Magnesium  1.7 this morning; will replete -- Repeat electrolytes in the a.m.  Chronic hypoxic respiratory failure/interstitial lung disease, oxygen  dependent Follows with pulmonology outpatient, Dr. Theophilus. Dependent on 4 L nasal cannula at baseline. -- Continue supplemental oxygen , currently on 4 L nasal cannula   DM2 On metformin 500 mg p.o. daily at baseline. -- Hold oral hypoglycemics while inpatient -- Very sensitive SSI for coverage -- CBG before every meal/at bedtime  Paroxysmal atrial fibrillation on anticoagulation HTN -- Tikosyn  reduced to 250 mcg p.o. twice daily by cardiology -- Metoprolol  succinate reduced to 25 mg p.o. daily -- Losartan  discontinued due to hypotension -- Lasix  40 mg p.o. daily -- Xarelto  20 mg p.o. daily  Pulmonary hypertension -- Pifenidone 801mg  PO TID  HLD -- Atorvastatin  20 g p.o. daily  Weakness/debility/deconditioning/gait disturbance: Patient currently resides at home, utilizes a rollator for ambulation. -- PT/OT recommending home health   DVT prophylaxis:  rivaroxaban  (XARELTO ) tablet 20 mg    Code Status: Full Code Family Communication: Updated family present at bedside this morning  Disposition Plan:  Level of care: Telemetry Cardiac Status is: Inpatient Remains inpatient appropriate because: Cardiology following, aspiration event this morning, SLP evaluation  Consultants:  Cardiology  Procedures:  TTE:   Antimicrobials:   None   Subjective: Patient seen examined bedside, sitting at edge of bed, family present.  G reports breathing stable, remains on 4 L oxygen  which is her baseline.  Later this morning after being evaluated by cardiology, patient had an aspiration event with water and desaturated down to the 70s with slow recovery.  Will hold discharge today, SLP evaluation and chest x-ray.  No other complaints or concerns at this time.    Denies headache, no dizziness, no chest pain, no palpitations, no fever/chills/night sweats, no nausea/vomiting/diarrhea, no focal weakness, no fatigue, no paresthesias. No acute events overnight per nursing staff.  Objective: Vitals:   04/21/24 0500 04/21/24 0512 04/21/24 0806 04/21/24 0825  BP:  106/66 107/65   Pulse:  87 83 85  Resp:  20 19 18   Temp:  97.8 F (36.6 C) 97.7 F (36.5 C)   TempSrc:  Oral Oral   SpO2:  95% 95% 95%  Weight: 97.6 kg     Height:        Intake/Output Summary (Last 24 hours) at 04/21/2024 1025 Last data filed at 04/21/2024 0700 Gross per 24 hour  Intake --  Output 1675 ml  Net -1675 ml   Filed Weights   04/19/24 0445 04/20/24 0520 04/21/24 0500  Weight: 96.9 kg 95.6 kg 97.6 kg    Examination:  Physical Exam: GEN: NAD, alert and oriented x 3, chronically ill appearance HEENT: NCAT, PERRL, EOMI, sclera clear, MMM PULM: CTAB w/o w/r/r, normal respiratory effort without accessory muscle use, on 4 L nasal cannula with SpO2 90% (baseline 4L [place]) CV: RRR w/o M/G/R GI: abd soft, NTND, NABS, no R/G/M MSK: 1+ bilateral lower extremity edema to mid shin with chronic venous changes, moves all extremity independently with preserved muscle strength NEURO: CN II-XII intact, no focal deficits, sensation to light touch intact PSYCH: normal mood/affect Integumentary: Chronic venous changes bilateral lower extremities, otherwise no other concerning rashes/lesions/wounds noted on exposed skin surfaces    Data Reviewed: I have personally reviewed  following labs and imaging studies  CBC: Recent Labs  Lab 04/16/24 1459 04/17/24 0530  WBC 5.1 5.7  NEUTROABS 3.7  --   HGB 10.7* 10.1*  HCT 34.9* 32.4*  MCV 93.8 92.6  PLT 173 180   Basic Metabolic Panel: Recent Labs  Lab 04/17/24 0530 04/18/24 0228 04/19/24 0213 04/20/24 0251 04/21/24 0256  NA 133* 130* 129* 128* 128*  K 4.8 4.3 4.0 4.2 4.0  CL 97* 92* 90* 93* 88*  CO2 26 29 29 28 29   GLUCOSE 167* 143* 108* 107* 109*  BUN 29* 25* 27* 29* 23  CREATININE 1.06* 1.18* 1.26* 1.08* 0.86  CALCIUM  8.7* 8.5* 8.5* 8.1* 8.4*  MG 1.9 1.6* 2.2 1.9 1.7   GFR: Estimated Creatinine Clearance: 54.6 mL/min (by C-G formula based on SCr of 0.86 mg/dL). Liver Function Tests: Recent Labs  Lab 04/16/24 1459  AST 32  ALT 20  ALKPHOS 71  BILITOT 0.8  PROT 6.6  ALBUMIN 2.9*   No results for input(s): LIPASE, AMYLASE in the last 168 hours. No results for input(s): AMMONIA in the last 168 hours. Coagulation Profile: No results for input(s): INR, PROTIME in the last 168 hours. Cardiac Enzymes: No results for input(s): CKTOTAL, CKMB, CKMBINDEX, TROPONINI in the last 168 hours. BNP (last 3 results) Recent Labs    04/27/23 1648 11/21/23 1607  PROBNP 858* 323.0*   HbA1C: No results for input(s): HGBA1C in  the last 72 hours.  CBG: Recent Labs  Lab 04/20/24 0622 04/20/24 1203 04/20/24 1546 04/20/24 2152 04/21/24 0638  GLUCAP 139* 141* 162* 181* 108*   Lipid Profile: Recent Labs    04/19/24 0213  CHOL 95  HDL 36*  LDLCALC 47  TRIG 60  CHOLHDL 2.6   Thyroid  Function Tests: No results for input(s): TSH, T4TOTAL, FREET4, T3FREE, THYROIDAB in the last 72 hours. Anemia Panel: No results for input(s): VITAMINB12, FOLATE, FERRITIN, TIBC, IRON, RETICCTPCT in the last 72 hours. Sepsis Labs: No results for input(s): PROCALCITON, LATICACIDVEN in the last 168 hours.  No results found for this or any previous visit (from the past  240 hours).       Radiology Studies: No results found.       Scheduled Meds:  atorvastatin   20 mg Oral QPM   dofetilide   500 mcg Oral BID   furosemide   40 mg Oral Daily   insulin  aspart  0-5 Units Subcutaneous QHS   insulin  aspart  0-6 Units Subcutaneous TID WC   metoprolol  succinate  25 mg Oral Daily   Pirfenidone   801 mg Oral TID WC   rivaroxaban   20 mg Oral Q supper   sodium chloride  flush  3 mL Intravenous Q12H   Continuous Infusions:   LOS: 4 days    Time spent: 50 minutes spent on 04/21/2024 caring for this patient face-to-face including chart review, ordering labs/tests, documenting, discussion with nursing staff, consultants, updating family and interview/physical exam    Camellia PARAS [place], DO [facility] Available via Epic secure chat 7am-7pm After these hours, please refer to coverage provider listed on [email or web address] 04/21/2024, 10:25 AM

## 2024-04-21 NOTE — Progress Notes (Signed)
 Progress Note  Patient Name: Carrie Haynes Date of Encounter: 04/21/2024  Primary Cardiologist: Jerel Balding, MD  Subjective   BP 107/65.  Sodium 128.  Creatinine improving (1.26 > 1.08 >0.86).  Net - 9.6 L on admission. When I saw her this morning she had an aspiration event drinking water, SpO2 fell to 70s and slowly improved.    Inpatient Medications    Scheduled Meds:  atorvastatin   20 mg Oral QPM   dofetilide   250 mcg Oral BID   furosemide   40 mg Oral Daily   insulin  aspart  0-5 Units Subcutaneous QHS   insulin  aspart  0-6 Units Subcutaneous TID WC   metoprolol  succinate  25 mg Oral Daily   Pirfenidone   801 mg Oral TID WC   rivaroxaban   20 mg Oral Q supper   sodium chloride  flush  3 mL Intravenous Q12H   Continuous Infusions:  magnesium  sulfate bolus IVPB     PRN Meds: acetaminophen  **OR** acetaminophen , oxyCODONE , senna-docusate, trimethobenzamide    Vital Signs    Vitals:   04/21/24 0500 04/21/24 0512 04/21/24 0806 04/21/24 0825  BP:  106/66 107/65   Pulse:  87 83 85  Resp:  20 19 18   Temp:  97.8 F (36.6 C) 97.7 F (36.5 C)   TempSrc:  Oral Oral   SpO2:  95% 95% 95%  Weight: 97.6 kg     Height:        Intake/Output Summary (Last 24 hours) at 04/21/2024 0915 Last data filed at 04/21/2024 0700 Gross per 24 hour  Intake --  Output 1675 ml  Net -1675 ml   Filed Weights   04/19/24 0445 04/20/24 0520 04/21/24 0500  Weight: 96.9 kg 95.6 kg 97.6 kg    Telemetry     Personally reviewed.  Normal sinus rhythm.  ECG    Not performed today.  Physical Exam   GEN: No acute distress.   Neck: No JVD. Cardiac: RRR, no murmur, rub, or gallop.  Respiratory: Nonlabored.  Bilateral coarse Rales GI: Soft, nontender, bowel sounds present. MS: No edema; No deformity.  Tender and erythematous lower extremities. Neuro:  Nonfocal. Psych: Alert and oriented x 3. Normal affect.  Labs    Chemistry Recent Labs  Lab 04/16/24 1459 04/17/24 0530  04/19/24 0213 04/20/24 0251 04/21/24 0256  NA 130*   < > 129* 128* 128*  K 5.1   < > 4.0 4.2 4.0  CL 96*   < > 90* 93* 88*  CO2 24   < > 29 28 29   GLUCOSE 113*   < > 108* 107* 109*  BUN 23   < > 27* 29* 23  CREATININE 1.01*   < > 1.26* 1.08* 0.86  CALCIUM  8.7*   < > 8.5* 8.1* 8.4*  PROT 6.6  --   --   --   --   ALBUMIN 2.9*  --   --   --   --   AST 32  --   --   --   --   ALT 20  --   --   --   --   ALKPHOS 71  --   --   --   --   BILITOT 0.8  --   --   --   --   GFRNONAA 56*   < > 43* 52* >60  ANIONGAP 10   < > 10 7 11    < > = values in this interval not displayed.  Hematology Recent Labs  Lab 04/16/24 1459 04/17/24 0530  WBC 5.1 5.7  RBC 3.72* 3.50*  HGB 10.7* 10.1*  HCT 34.9* 32.4*  MCV 93.8 92.6  MCH 28.8 28.9  MCHC 30.7 31.2  RDW 15.5 15.5  PLT 173 180    Cardiac Enzymes Recent Labs  Lab 04/16/24 1459 04/16/24 1755  TROPONINIHS 11 12    BNP Recent Labs  Lab 04/16/24 1459 04/18/24 0228  BNP 532.1* 466.1*     DDimerNo results for input(s): DDIMER in the last 168 hours.    Assessment & Plan   Acute on chronic diastolic heart failure/cor pulmonale: SOB improved and leg swelling resolved completely.  Diuresed well with IV Lasix .  Appears euvolemic, transitioned to p.o. Lasix .  Had aspiration event this morning causing worsening hypoxia, she reports this has been on ongoing issue, recommend speech evaluation  Paroxysmal A-fib: In normal sinus rhythm on telemetry.  Has been on Tikosyn  500 mcg twice daily.  Her dose of Tikosyn  had been reduced due to AKI.  With resolution of her AKI, can resume home dose.  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  Follows with pulmonary.  Hypertension: BP has been soft, held losartan  and reduce toprol  XL to 25 mg daily.  BP improved   Signed, Lonni LITTIE Nanas, MD  04/21/2024, 9:15 AM

## 2024-04-22 ENCOUNTER — Inpatient Hospital Stay (HOSPITAL_COMMUNITY)

## 2024-04-22 DIAGNOSIS — I5033 Acute on chronic diastolic (congestive) heart failure: Secondary | ICD-10-CM | POA: Diagnosis not present

## 2024-04-22 LAB — GLUCOSE, CAPILLARY
Glucose-Capillary: 117 mg/dL — ABNORMAL HIGH (ref 70–99)
Glucose-Capillary: 160 mg/dL — ABNORMAL HIGH (ref 70–99)

## 2024-04-22 LAB — CBC
HCT: 32.6 % — ABNORMAL LOW (ref 36.0–46.0)
Hemoglobin: 10.5 g/dL — ABNORMAL LOW (ref 12.0–15.0)
MCH: 28.7 pg (ref 26.0–34.0)
MCHC: 32.2 g/dL (ref 30.0–36.0)
MCV: 89.1 fL (ref 80.0–100.0)
Platelets: 245 K/uL (ref 150–400)
RBC: 3.66 MIL/uL — ABNORMAL LOW (ref 3.87–5.11)
RDW: 15.4 % (ref 11.5–15.5)
WBC: 5 K/uL (ref 4.0–10.5)
nRBC: 0 % (ref 0.0–0.2)

## 2024-04-22 LAB — BASIC METABOLIC PANEL WITH GFR
Anion gap: 8 (ref 5–15)
BUN: 16 mg/dL (ref 8–23)
CO2: 29 mmol/L (ref 22–32)
Calcium: 8.3 mg/dL — ABNORMAL LOW (ref 8.9–10.3)
Chloride: 90 mmol/L — ABNORMAL LOW (ref 98–111)
Creatinine, Ser: 0.75 mg/dL (ref 0.44–1.00)
GFR, Estimated: >60 mL/min (ref >60)
Glucose, Bld: 134 mg/dL — ABNORMAL HIGH (ref 70–99)
Potassium: 3.7 mmol/L (ref 3.5–5.1)
Sodium: 127 mmol/L — ABNORMAL LOW (ref 135–145)

## 2024-04-22 LAB — MAGNESIUM: Magnesium: 1.9 mg/dL (ref 1.7–2.4)

## 2024-04-22 MED ORDER — FUROSEMIDE 40 MG PO TABS: 40.0000 mg | ORAL_TABLET | Freq: Every day | ORAL | 0 refills | Status: DC

## 2024-04-22 MED ORDER — MAGNESIUM SULFATE 2 GM/50ML IV SOLN
2.0000 g | Freq: Once | INTRAVENOUS | Status: AC
  Administered 2024-04-22: 2 g via INTRAVENOUS
  Filled 2024-04-22: qty 50

## 2024-04-22 MED ORDER — METOPROLOL SUCCINATE ER 25 MG PO TB24: 25.0000 mg | ORAL_TABLET | Freq: Every day | ORAL | 0 refills | Status: AC

## 2024-04-22 MED ORDER — POTASSIUM CHLORIDE CRYS ER 20 MEQ PO TBCR
40.0000 meq | EXTENDED_RELEASE_TABLET | Freq: Once | ORAL | Status: AC
  Administered 2024-04-22: 40 meq via ORAL
  Filled 2024-04-22: qty 2

## 2024-04-22 NOTE — Progress Notes (Signed)
 Occupational Therapy Treatment Patient Details Name: Carrie Haynes MRN: [id number] DOB: November 01, 1940 Today's Date: 04/22/2024   History of present illness 83 yo female adm 04/16/24 with LB edema and SOB, acute on chronic HFpEF. PMhx: ILD, T2DM, HTN, Afib on Xarelto , pulmonary HTN   OT comments  Pt progressing towards OT goals.  Provided energy conservation technique handout and educated on safety. Pt using RW for mobility with supervision, min assist for LB dressing, and contact guard for toileting.  Cueing for PLB throughout session, SPO2 on 5L upon entry with required 6L after mobility to bathroom to recover to >88% but left on 5L upon exit and SPO2 stable.  Continue to recommend HHOT.  Will follow acutely.        If plan is discharge home, recommend the following:  A little help with walking and/or transfers;A little help with bathing/dressing/bathroom;Assistance with cooking/housework   Equipment Recommendations  BSC/3in1 (bariatric)    Recommendations for Other Services      Precautions / Restrictions Precautions Precautions: Fall;Other (comment) Recall of Precautions/Restrictions: Intact Precaution/Restrictions Comments: watch SPO2 with activity Restrictions Weight Bearing Restrictions Per Provider Order: No       Mobility Bed Mobility Overal bed mobility: Needs Assistance Bed Mobility: Supine to Sit     Supine to sit: Min assist     General bed mobility comments: daughter assisting to ascend trunk with hand held assist    Transfers Overall transfer level: Needs assistance Equipment used: Rolling walker (2 wheels) Transfers: Sit to/from Stand Sit to Stand: Supervision                 Balance Overall balance assessment: Needs assistance Sitting-balance support: No upper extremity supported, Feet supported Sitting balance-Leahy Scale: Fair     Standing balance support: Bilateral upper extremity supported, During functional activity Standing balance-Leahy  Scale: Poor Standing balance comment: Reliant on RW                           ADL either performed or assessed with clinical judgement   ADL Overall ADL's : Needs assistance/impaired                     Lower Body Dressing: Minimal assistance;Sit to/from stand Lower Body Dressing Details (indicate cue type and reason): assist for socks, supervision standing Toilet Transfer: Supervision/safety;Ambulation;Rolling walker (2 wheels)   Toileting- Clothing Manipulation and Hygiene: Supervision/safety;Sit to/from stand;Sitting/lateral lean       Functional mobility during ADLs: Supervision/safety;Rolling walker (2 wheels)      Extremity/Trunk Assessment Upper Extremity Assessment Upper Extremity Assessment: Generalized weakness            Vision       Perception     Praxis     Communication Communication Communication: No apparent difficulties   Cognition Arousal: Alert Behavior During Therapy: WFL for tasks assessed/performed Cognition: No apparent impairments                               Following commands: Intact        Cueing   Cueing Techniques: Verbal cues  Exercises      Shoulder Instructions       General Comments pt on 5L upon entry, desaturates to 84% with mobility to bathroom and increased to 6L in order to recover, decreased back to 5L resting on commode; pt educated on energy conservation techniques and provided  handout    Pertinent Vitals/ Pain       Pain Assessment Pain Assessment: No/denies pain  Home Living                                          Prior Functioning/Environment              Frequency  Min 2X/week        Progress Toward Goals  OT Goals(current goals can now be found in the care plan section)  Progress towards OT goals: Progressing toward goals  Acute Rehab OT Goals Patient Stated Goal: home today OT Goal Formulation: With patient Time For Goal Achievement:  05/02/24 Potential to Achieve Goals: Good  Plan      Co-evaluation                 AM-PAC OT 6 Clicks Daily Activity     Outcome Measure   Help from another person eating meals?: None Help from another person taking care of personal grooming?: A Little Help from another person toileting, which includes using toliet, bedpan, or urinal?: A Little Help from another person bathing (including washing, rinsing, drying)?: A Little Help from another person to put on and taking off regular upper body clothing?: A Little Help from another person to put on and taking off regular lower body clothing?: A Little 6 Click Score: 19    End of Session Equipment Utilized During Treatment: Rolling walker (2 wheels);Oxygen   OT Visit Diagnosis: Unsteadiness on feet (R26.81);Other abnormalities of gait and mobility (R26.89)   Activity Tolerance Patient tolerated treatment well   Patient Left with call bell/phone within reach;with family/visitor present;Other (comment) (on commode)   Nurse Communication Mobility status;Other (comment) (position on commode)        Time: 9049-8985 OT Time Calculation (min): 24 min  Charges: OT General Charges $OT Visit: 1 Visit OT Treatments $Self Care/Home Management : 23-37 mins  Etta NOVAK, OT Acute Rehabilitation Services Office [phone] Secure Chat Preferred    Etta GORMAN [place] 04/22/2024, 10:20 AM

## 2024-04-22 NOTE — Plan of Care (Signed)

## 2024-04-22 NOTE — Discharge Summary (Signed)
 Physician Discharge Summary  Carrie Haynes FMW:[id number] DOB: 06/21/1941 DOA: 04/16/2024  PCP: Teresa Channel, MD  Admit date: 04/16/2024 Discharge date: 04/22/2024  Admitted From: Home Disposition: Home  Recommendations for Outpatient Follow-up:  Follow up with PCP in 1-2 weeks Follow-up with [facility] gastroenterology, Dr. Burnette as needed for dysphagia, possible esophageal dysmotility Follow-up with cardiology as scheduled Started on furosemide  40 g p.o. daily Discontinue losartan  Metoprolol  succinate decreased to 25 mg p.o. daily Please obtain BMP to assess renal function the  Home Health: Declined Home health services Equipment/Devices: Bedside commode  Discharge Condition: Stable CODE STATUS: Full code Diet recommendation: Heart healthy/consistent carbohydrate diet  History of present illness:  Carrie Haynes is a 83 y.o. female with past medical history significant for chronic hypoxic respiratory failure/interstitial lung disease on 4 L nasal cannula at baseline, DM2, HTN, paroxysmal atrial fibrillation on Xarelto , pulmonary HTN who presented to [facility] ED on 04/16/2024 with progressive shortness of breath, lower extremity edema.  Additionally reports decreased activity tolerance.  Cough mainly nonproductive.  Denies fever, no chest pain.   In the ED, temperature 98.1 F, HR 67, RR 18, BP 130/104, SpO2 100% on 4 L nasal cannula.  WBC 5.1, hemoglobin 10.7, platelet count 173.  Sodium 130, potassium 5.1, chloride 96, CO2 24, glucose 113, BUN 23, creat 1.01.  AST 32, ALT 20, 2 bilirubin 0.8.  BNP 532.1.  High sensitive troponin 11> 12.  Chest x-ray with no acute cardiopulmonary disease process.  Chronic interstitial thickening consistent with interstitial lung disease, stable cardiomegaly.  EKG with NSR, first-degree AV block, rate 70, QTc 442.  Patient was given furosemide  80 mg IV x 1 in the ED.  TRH consulted for admission for further evaluation management of diastolic CHF   Hospital  course:  Acute on chronic diastolic congestive heart failure Patient presenting with progressive dyspnea, lower extremity edema.  BNP elevated 532.1 with 2+ lower extremity edema.  Previous TTE 05/18/2023 with LVEF 55-60%, LV with no regional wall motion abnormalities, grade 1 diastolic dysfunction, RV mildly enlarged, RA mildly dilated, no aortic stenosis, IVC normal in size.  Follows with cardiology, Dr. Bette outpatient.  Repeat TTE 7/2 with LVEF 55 to 60%, no LV regional wall motion abnormalities, grade 1 diastolic dysfunction, IVC dilated.  Cardiology was consulted and followed during hospital course.  Patient was diuresed with IV furosemide  and transition to 40 mg p.o. daily.  Losartan  was discontinued due to borderline hypotension and the need for diuretic treatment.  Outpatient follow-up with cardiology.   Aspiration/dysphagia While drinking water with potassium supplement, patient had an aspiration event desaturating down to the 70s with slow recovery.  Patient was seen by speech therapy and underwent MBS with potential esophageal dysphagia/dysmotility.  Discussed with patient and daughter regarding aspiration precautions, eating and drinking slowly with an upright position.  Outpatient follow-up with GI as needed.  Hyponatremia Sodium 130 on admission, likely secondary to hypervolemic hyponatremia in the setting of volume overload.  Continue furosemide  as above.  Repeat BMP 1 week.   Hypomagnesemia Repleted during hospitalization.   Chronic hypoxic respiratory failure/interstitial lung disease, oxygen  dependent Follows with pulmonology outpatient, Dr. Theophilus. Dependent on 4 L nasal cannula at baseline.   DM2 On metformin 500 mg p.o. daily at baseline.   Paroxysmal atrial fibrillation on anticoagulation HTN Tikosyn  500 mcg p.o. twice daily; Metoprolol  succinate reduced to 25 mg p.o. daily. Losartan  discontinued due to hypotension.  Started on Lasix  2040 mg p.o. daily as above.   Continue Xarelto   20 mg p.o. daily.   Pulmonary hypertension Pifenidone 801mg  PO TID   HLD Atorvastatin  20 g p.o. daily   Weakness/debility/deconditioning/gait disturbance: Patient currently resides at home, utilizes a rollator for ambulation. PT/OT recommending home health; patient declined.  Discharge Diagnoses:  Principal Problem:   Acute on chronic heart failure with preserved ejection fraction (HFpEF) (HCC) Active Problems:   A-fib (HCC)   Diabetes mellitus without complication (HCC)   Chronic respiratory failure with hypoxia (HCC)   Postinflammatory pulmonary fibrosis (HCC)   Pulmonary hypertension (HCC)   Hyponatremia   RVF (right ventricular failure) [facility])    Discharge Instructions  Discharge Instructions     Call MD for:  difficulty breathing, headache or visual disturbances   Complete by: As directed    Call MD for:  extreme fatigue   Complete by: As directed    Call MD for:  persistant dizziness or light-headedness   Complete by: As directed    Call MD for:  persistant nausea and vomiting   Complete by: As directed    Call MD for:  severe uncontrolled pain   Complete by: As directed    Call MD for:  temperature >100.4   Complete by: As directed    Diet - low sodium heart healthy   Complete by: As directed    Increase activity slowly   Complete by: As directed       Allergies as of 04/22/2024       Reactions   Calcium  Channel Blockers Other (See Comments)   Acute hypotension, patient became brady/asystole < 5 seconds   Estrogens Conjugated Other (See Comments)   Other reaction(s): DVT   Nystatin Hives        Medication List     STOP taking these medications    losartan  50 MG tablet Commonly known as: COZAAR        TAKE these medications    atorvastatin  20 MG tablet Commonly known as: LIPITOR Take 20 mg by mouth every evening.   dofetilide  500 MCG capsule Commonly known as: TIKOSYN  TAKE 1 CAPSULE BY MOUTH TWICE A DAY   furosemide   40 MG tablet Commonly known as: LASIX  Take 1 tablet (40 mg total) by mouth daily. Start taking on: April 23, 2024   Lumigan 0.01 % Soln Generic drug: bimatoprost Place 1 drop into both eyes at bedtime.   Magnesium  Oxide -Mg Supplement 500 MG Tabs Take 500 mg by mouth every evening.   metFORMIN 500 MG 24 hr tablet Commonly known as: GLUCOPHAGE-XR Take 500 mg by mouth at bedtime.   metoprolol  succinate 25 MG 24 hr tablet Commonly known as: TOPROL -XL Take 1 tablet (25 mg total) by mouth daily. Start taking on: April 23, 2024 What changed:  medication strength how much to take   Pirfenidone  801 MG Tabs Commonly known as: Esbriet  Take 1 tablet (801 mg total) by mouth 3 (three) times daily with meals. What changed: when to take this   Vitamin D -3 125 MCG (5000 UT) Tabs Take 5,000 Units by mouth every evening.   Xarelto  20 MG Tabs tablet Generic drug: rivaroxaban  TAKE 1 TABLET BY MOUTH DAILY WITH SUPPER               Durable Medical Equipment  (From admission, onward)           Start     Ordered   04/19/24 0700  For home use only DME 3 n 1  Once       Comments: bariatric  04/19/24 0700            Allergies  Allergen Reactions   Calcium  Channel Blockers Other (See Comments)    Acute hypotension, patient became brady/asystole < 5 seconds   Estrogens Conjugated Other (See Comments)    Other reaction(s): DVT   Nystatin Hives    Consultations: Cardiology   Procedures/Studies: DG Swallowing Func-Speech Pathology Result Date: 04/22/2024 Table formatting from the original result was not included. Images from the original result were not included. Modified Barium Swallow Study Patient Details Name: MAHOGANI HOLOHAN MRN: [id number] Date of Birth: Jun 30, 1941 Today's Date: 04/22/2024 HPI/PMH: HPI: Pt is an 83 y.o. female who presented with bilateral lower extremity edema and worsening shortness of breath. CXR 7/1 negative for acute changes. Per referring MD's note,  While drinking water this morning, patient had an aspiration event desaturating down to the 70s with slow recovery. PMH: interstitial lung disease, chronic hypoxic respiratory failure, type 2 diabetes mellitus, hypertension, atrial fibrillation on Xarelto , and pulmonary hypertension. Clinical Impression: Pt presents with grossly normal swallow function.  There was no penetration or aspiration of any consistencies trialed, including straw sips of thin liquid dairy.  Pt exhibited prompt and appropriate oral response to bolus trails with good oral clearance.  During esophageal sweep with pill simulation there was retention of contrast in the esophagus with backflow noted.  Barium tablet transited through esophagus without difficulty.  Appearance of possible cervical osteophytes noted (see image below), but without impeding bolus flow through esophagus. Pt has no further ST needs for swallowing.  Consider further assessment for GERD and/or esophageal dysphagia/dysmotility if symptoms persist.  Recommend continuing regular diet with thin liquids with general aspiration precautions. Factors that may increase risk of adverse event in presence of aspiration Noe & Lianne 2021):  None DIGEST Swallow Severity Rating*  Safety: 0  Efficiency: 0  Overall Pharyngeal Swallow Severity: 0 1: mild; 2: moderate; 3: severe; 4: profound *The Dynamic Imaging Grade of Swallowing Toxicity is standardized for the head and neck cancer population, however, demonstrates promising clinical applications across populations to standardize the clinical rating of pharyngeal swallow safety and severity. Possible cervical osteophytes: Recommendations/Plan: Swallowing Evaluation Recommendations Swallowing Evaluation Recommendations Recommendations: PO diet PO Diet Recommendation: Regular; Thin liquids (Level 0) Liquid Administration via: Cup; Straw Medication Administration: Whole meds with liquid Supervision: Patient able to self-feed Swallowing  strategies  : Slow rate; Small bites/sips Postural changes: Position pt fully upright for meals; Stay upright 30-60 min after meals Oral care recommendations: Oral care BID (2x/day) Recommended consults: Consider esophageal assessment Treatment Plan Treatment Plan Treatment recommendations: No treatment recommended at this time Follow-up recommendations: No SLP follow up Functional status assessment: Patient has not had a recent decline in their functional status. Treatment frequency: -- (N/A) Recommendations Recommendations for follow up therapy are one component of a multi-disciplinary discharge planning process, led by the attending physician.  Recommendations may be updated based on patient status, additional functional criteria and insurance authorization. Assessment: Orofacial Exam: Orofacial Exam Oral Cavity: Oral Hygiene: WFL Oral Cavity - Dentition: Adequate natural dentition Orofacial Anatomy: WFL Oral Motor/Sensory Function: -- (see BSE) Anatomy: Anatomy: Suspected cervical osteophytes (did not impede bolus flow) Boluses Administered: Boluses Administered Boluses Administered: Thin liquids (Level 0); Mildly thick liquids (Level 2, nectar thick); Moderately thick liquids (Level 3, honey thick); Puree; Solid  Oral Impairment Domain: Oral Impairment Domain Lip Closure: No labial escape Tongue control during bolus hold: Cohesive bolus between tongue to palatal seal Bolus preparation/mastication: Timely and efficient  chewing and mashing Bolus transport/lingual motion: Brisk tongue motion Oral residue: Complete oral clearance Location of oral residue : N/A Initiation of pharyngeal swallow : Posterior angle of the ramus  Pharyngeal Impairment Domain: Pharyngeal Impairment Domain Soft palate elevation: No bolus between soft palate (SP)/pharyngeal wall (PW) Laryngeal elevation: Complete superior movement of thyroid  cartilage with complete approximation of arytenoids to epiglottic petiole Anterior hyoid excursion:  Complete anterior movement Epiglottic movement: Complete inversion Laryngeal vestibule closure: Complete, no air/contrast in laryngeal vestibule Pharyngeal stripping wave : Present - complete Pharyngeal contraction (A/P view only): N/A Pharyngoesophageal segment opening: Complete distension and complete duration, no obstruction of flow Tongue base retraction: No contrast between tongue base and posterior pharyngeal wall (PPW) Pharyngeal residue: Trace residue within or on pharyngeal structures Location of pharyngeal residue: Valleculae; Pyriform sinuses  Esophageal Impairment Domain: Esophageal Impairment Domain Esophageal clearance upright position: Esophageal retention with retrograde flow below pharyngoesophageal segment (PES) Pill: Pill Consistency administered: Thin liquids (Level 0) Thin liquids (Level 0): [facility] Penetration/Aspiration Scale Score: Penetration/Aspiration Scale Score 1.  Material does not enter airway: Thin liquids (Level 0); Mildly thick liquids (Level 2, nectar thick); Moderately thick liquids (Level 3, honey thick); Puree; Solid; Pill Compensatory Strategies: Compensatory Strategies Compensatory strategies: No   General Information: Caregiver present: No  Diet Prior to this Study: Regular; Thin liquids (Level 0)   Temperature : Normal   Respiratory Status: WFL   Supplemental O2: Nasal cannula   History of Recent Intubation: No  Behavior/Cognition: Alert; Cooperative; Pleasant mood Self-Feeding Abilities: Able to self-feed Baseline vocal quality/speech: Normal No data recorded Volitional Swallow: Able to elicit Exam Limitations: No limitations Goal Planning: Prognosis for improved oropharyngeal function: -- (N/A) No data recorded No data recorded Patient/Family Stated Goal: none stated, no deficits Consulted and agree with results and recommendations: Patient Pain: Pain Assessment Pain Assessment: No/denies pain End of Session: Start Time:SLP Start Time (ACUTE ONLY): 0910 Stop Time: SLP Stop  Time (ACUTE ONLY): 0921 Time Calculation:SLP Time Calculation (min) (ACUTE ONLY): 11 min Charges: SLP Evaluations $ SLP Speech Visit: 1 Visit SLP Evaluations $BSS Swallow: 1 Procedure $MBS Swallow: 1 Procedure SLP visit diagnosis: SLP Visit Diagnosis: Dysphagia, unspecified (R13.10) Past Medical History: Past Medical History: Diagnosis Date  A-fib (HCC)   Diabetes mellitus without complication (HCC)   Hyperlipemia   Hypertension  Past Surgical History: Past Surgical History: Procedure Laterality Date  CARDIOVERSION N/A 05/18/2017  Procedure: CARDIOVERSION;  Surgeon: Delford Maude BROCKS, MD;  Location: [facility] ENDOSCOPY;  Service: Cardiovascular;  Laterality: N/A;  CARDIOVERSION N/A 05/23/2017  Procedure: CARDIOVERSION;  Surgeon: Shlomo Wilbert SAUNDERS, MD;  Location: [facility] ENDOSCOPY;  Service: Cardiovascular;  Laterality: N/A;  CARDIOVERSION N/A 08/28/2019  Procedure: CARDIOVERSION;  Surgeon: Jeffrie Oneil BROCKS, MD;  Location: [facility] ENDOSCOPY;  Service: Cardiovascular;  Laterality: N/A;  COLONOSCOPY N/A 08/07/2020  Procedure: COLONOSCOPY;  Surgeon: Burnette Fallow, MD;  Location: [facility] ENDOSCOPY;  Service: Endoscopy;  Laterality: N/A;  ESOPHAGOGASTRODUODENOSCOPY N/A 08/07/2020  Procedure: ESOPHAGOGASTRODUODENOSCOPY (EGD);  Surgeon: Burnette Fallow, MD;  Location: [facility] ENDOSCOPY;  Service: Endoscopy;  Laterality: N/A;  GIVENS CAPSULE STUDY N/A 08/09/2020  Procedure: GIVENS CAPSULE STUDY;  Surgeon: Burnette Fallow, MD;  Location: [facility] ENDOSCOPY;  Service: Endoscopy;  Laterality: N/A;  RIGHT HEART CATH N/A 06/23/2023  Procedure: RIGHT HEART CATH;  Surgeon: Cherrie Toribio SAUNDERS, MD;  Location: [facility] INVASIVE CV LAB;  Service: Cardiovascular;  Laterality: N/A;  RIGHT/LEFT HEART CATH AND CORONARY ANGIOGRAPHY N/A 05/12/2017  Procedure: Right/Left Heart Cath and Coronary Angiography;  Surgeon: Claudene Victory ORN, MD;  Location: [facility] INVASIVE CV LAB;  Service: Cardiovascular;  Laterality: N/A;  TEE WITHOUT CARDIOVERSION N/A 05/18/2017  Procedure: TRANSESOPHAGEAL  ECHOCARDIOGRAM (TEE);  Surgeon: Delford Maude BROCKS, MD;  Location: [facility] ENDOSCOPY;  Service: Cardiovascular;  Laterality: N/A; Anette FORBES Grippe, MA, CCC-SLP Acute Rehabilitation Services Office: [phone] 04/22/2024, 9:42 AM  DG CHEST PORT 1 VIEW Result Date: 04/21/2024 CLINICAL DATA:  Aspiration pneumonia EXAM: PORTABLE CHEST 1 VIEW COMPARISON:  04/16/2024 FINDINGS: Single frontal view of the chest demonstrates stable enlargement of the cardiac silhouette. Stable diffuse interstitial prominence with chronic bibasilar scarring and fibrosis. No acute airspace disease, effusion, or pneumothorax. No acute bony abnormalities. IMPRESSION: 1. No acute airspace disease. 2. Stable chronic interstitial lung disease with bibasilar scarring and fibrosis. Electronically Signed   By: Ozell Daring M.D.   On: 04/21/2024 13:08   ECHOCARDIOGRAM COMPLETE Result Date: 04/17/2024    ECHOCARDIOGRAM REPORT   Patient Name:   TRUE SHACKLEFORD Date of Exam: 04/17/2024 Medical Rec #:  [id number]        Height:       60.0 in Accession #:    [id number]       Weight:       209.0 lb Date of Birth:  12-05-1940       BSA:          1.902 m Patient Age:    82 years         BP:           124/51 mmHg Patient Gender: F                HR:           77 bpm. Exam Location:  Inpatient Procedure: 2D Echo, Cardiac Doppler and Color Doppler (Both Spectral and Color            Flow Doppler were utilized during procedure). Indications:    CHF-Acute Diastolic I50.31  History:        Patient has prior history of Echocardiogram examinations, most                 recent 05/18/2023. Arrythmias:Atrial Fibrillation; Risk                 Factors:Hypertension and Diabetes.  Sonographer:    Jayson Gaskins Referring Phys: [id number] TIMOTHY S OPYD IMPRESSIONS  1. Left ventricular ejection fraction, by estimation, is 55 to 60%. The left ventricle has normal function. The left ventricle has no regional wall motion abnormalities. Left ventricular diastolic parameters are consistent  with Grade I diastolic dysfunction (impaired relaxation).  2. D-shaped ventricular septum suggestive of RV pressure/volume overload. Right ventricular systolic function is moderately reduced. The right ventricular size is severely enlarged. There is severely elevated pulmonary artery systolic pressure. The estimated right ventricular systolic pressure is 92.8 mmHg.  3. Right atrial size was moderately dilated.  4. The mitral valve is normal in structure. Trivial mitral valve regurgitation. No evidence of mitral stenosis. Moderate mitral annular calcification.  5. The tricuspid valve is abnormal. Tricuspid valve regurgitation is severe. Systolic flow reversal in the hepatic vein doppler pattern.  6. The aortic valve is tricuspid. There is moderate calcification of the aortic valve. Aortic valve regurgitation is not visualized. Aortic valve sclerosis/calcification is present, without any evidence of aortic stenosis.  7. The inferior vena cava is dilated in size with <50% respiratory variability, suggesting right atrial pressure of 15 mmHg. FINDINGS  Left Ventricle: Left ventricular ejection fraction, by estimation, is 55 to 60%.  The left ventricle has normal function. The left ventricle has no regional wall motion abnormalities. The left ventricular internal cavity size was normal in size. There is  no left ventricular hypertrophy. Left ventricular diastolic parameters are consistent with Grade I diastolic dysfunction (impaired relaxation). Right Ventricle: D-shaped ventricular septum suggestive of RV pressure/volume overload. The right ventricular size is severely enlarged. No increase in right ventricular wall thickness. Right ventricular systolic function is moderately reduced. There is severely elevated pulmonary artery systolic pressure. The tricuspid regurgitant velocity is 4.41 m/s, and with an assumed right atrial pressure of 15 mmHg, the estimated right ventricular systolic pressure is 92.8 mmHg. Left Atrium:  Left atrial size was normal in size. Right Atrium: Right atrial size was moderately dilated. Pericardium: Trivial pericardial effusion is present. Mitral Valve: The mitral valve is normal in structure. Moderate mitral annular calcification. Trivial mitral valve regurgitation. No evidence of mitral valve stenosis. Tricuspid Valve: The tricuspid valve is abnormal. Tricuspid valve regurgitation is severe. Aortic Valve: The aortic valve is tricuspid. There is moderate calcification of the aortic valve. Aortic valve regurgitation is not visualized. Aortic valve sclerosis/calcification is present, without any evidence of aortic stenosis. Aortic valve mean gradient measures 4.0 mmHg. Aortic valve peak gradient measures 7.4 mmHg. Aortic valve area, by VTI measures 2.22 cm. Pulmonic Valve: The pulmonic valve was normal in structure. Pulmonic valve regurgitation is not visualized. Aorta: The aortic root is normal in size and structure. Venous: The inferior vena cava is dilated in size with less than 50% respiratory variability, suggesting right atrial pressure of 15 mmHg. IAS/Shunts: No atrial level shunt detected by color flow Doppler.  LEFT VENTRICLE PLAX 2D LVIDd:         4.40 cm   Diastology LVIDs:         2.80 cm   LV e' medial:    10.40 cm/s LV PW:         1.10 cm   LV E/e' medial:  4.7 LV IVS:        0.90 cm   LV e' lateral:   8.81 cm/s LVOT diam:     1.80 cm   LV E/e' lateral: 5.5 LV SV:         51 LV SV Index:   27 LVOT Area:     2.54 cm  RIGHT VENTRICLE             IVC RV Basal diam:  4.80 cm     IVC diam: 2.80 cm RV Mid diam:    4.80 cm RV S prime:     17.50 cm/s TAPSE (M-mode): 2.2 cm LEFT ATRIUM             Index        RIGHT ATRIUM           Index LA Vol (A2C):   52.2 ml 27.45 ml/m  RA Area:     29.60 cm LA Vol (A4C):   28.1 ml 14.78 ml/m  RA Volume:   112.00 ml 58.89 ml/m LA Biplane Vol: 39.6 ml 20.82 ml/m  AORTIC VALVE AV Area (Vmax):    2.19 cm AV Area (Vmean):   2.13 cm AV Area (VTI):     2.22 cm  AV Vmax:           136.00 cm/s AV Vmean:          97.800 cm/s AV VTI:            0.229 m AV Peak Grad:  7.4 mmHg AV Mean Grad:      4.0 mmHg LVOT Vmax:         117.00 cm/s LVOT Vmean:        81.700 cm/s LVOT VTI:          0.200 m LVOT/AV VTI ratio: 0.87  AORTA Ao Root diam: 2.70 cm MITRAL VALVE               TRICUSPID VALVE MV Area (PHT): 2.48 cm    TR Peak grad:   77.8 mmHg MV Decel Time: 306 msec    TR Vmax:        441.00 cm/s MV E velocity: 48.70 cm/s MV A velocity: 77.60 cm/s  SHUNTS MV E/A ratio:  0.63        Systemic VTI:  0.20 m                            Systemic Diam: 1.80 cm Dalton McleanMD Electronically signed by Ezra Kanner Signature Date/Time: 04/17/2024/4:09:11 PM    Final    DG Chest 2 View Result Date: 04/16/2024 CLINICAL DATA:  Short of breath. EXAM: CHEST - 2 VIEW COMPARISON:  08/05/2020.  High-resolution chest CT, 08/28/2023. FINDINGS: Cardiac silhouette is mildly enlarged, stable. No mediastinal or hilar masses. No convincing adenopathy. Bilateral interstitial thickening is stable. No lung consolidation or convincing edema. No pleural effusion or pneumothorax. Skeletal structures are demineralized, but intact. IMPRESSION: 1. No acute cardiopulmonary disease. 2. Chronic interstitial thickening consistent with interstitial lung disease. Stable cardiomegaly. Electronically Signed   By: Alm Parkins M.D.   On: 04/16/2024 15:29     Subjective: Patient seen examined bedside, lying in bed.  Just returned from modified barium swallow study.  No complaints this morning.  No further aspiration events since yesterday.  Discussed needs to eat more slowly, maintain upright position.  Discussed medication changes with reduction of metoprolol , addition of furosemide , discontinuation of losartan  with the patient and daughter at bedside.  No other specific complaints or concerns at this time.  Denies headache, no dizziness, no chest pain, no palpitations, no shortness of breath, no abdominal pain,  no fever/chills/night sweats, no nausea/vomiting/diarrhea, no focal weakness, no fatigue, no paresthesia.  No acute events overnight per nursing staff.  Discharge Exam: Vitals:   04/22/24 0426 04/22/24 0711  BP: 125/61 107/61  Pulse: 71 79  Resp: 20 18  Temp: 98.2 F (36.8 C) 98.1 F (36.7 C)  SpO2: 100% 95%   Vitals:   04/21/24 1934 04/21/24 2357 04/22/24 0426 04/22/24 0711  BP: 108/62 109/60 125/61 107/61  Pulse: 69 72 71 79  Resp: (!) 21 20 20 18   Temp: 97.9 F (36.6 C) 97.6 F (36.4 C) 98.2 F (36.8 C) 98.1 F (36.7 C)  TempSrc: Oral Axillary Axillary Oral  SpO2: 96% 96% 100% 95%  Weight:   96.4 kg   Height:        Physical Exam: GEN: NAD, alert and oriented x 3, chronically ill appearance HEENT: NCAT, PERRL, EOMI, sclera clear, MMM PULM: CTAB w/o w/r/r, normal respiratory effort without accessory muscle use, on 4 L nasal cannula with SpO2 95% (baseline 4L [place]) CV: RRR w/o M/G/R GI: abd soft, NTND, NABS, no R/G/M MSK: 1+ bilateral lower extremity edema to mid shin with chronic venous changes, moves all extremity independently with preserved muscle strength NEURO: CN II-XII intact, no focal deficits, sensation to light touch intact PSYCH: normal mood/affect Integumentary: Chronic venous changes  bilateral lower extremities, otherwise no other concerning rashes/lesions/wounds noted on exposed skin surfaces    The results of significant diagnostics from this hospitalization (including imaging, microbiology, ancillary and laboratory) are listed below for reference.     Microbiology: No results found for this or any previous visit (from the past 240 hours).   Labs: BNP (last 3 results) Recent Labs    04/16/24 1459 04/18/24 0228  BNP 532.1* 466.1*   Basic Metabolic Panel: Recent Labs  Lab 04/18/24 0228 04/19/24 0213 04/20/24 0251 04/21/24 0256 04/22/24 0446  NA 130* 129* 128* 128* 127*  K 4.3 4.0 4.2 4.0 3.7  CL 92* 90* 93* 88* 90*  CO2 29 29 28 29 29    GLUCOSE 143* 108* 107* 109* 134*  BUN 25* 27* 29* 23 16  CREATININE 1.18* 1.26* 1.08* 0.86 0.75  CALCIUM  8.5* 8.5* 8.1* 8.4* 8.3*  MG 1.6* 2.2 1.9 1.7 1.9   Liver Function Tests: Recent Labs  Lab 04/16/24 1459  AST 32  ALT 20  ALKPHOS 71  BILITOT 0.8  PROT 6.6  ALBUMIN 2.9*   No results for input(s): LIPASE, AMYLASE in the last 168 hours. No results for input(s): AMMONIA in the last 168 hours. CBC: Recent Labs  Lab 04/16/24 1459 04/17/24 0530 04/22/24 0446  WBC 5.1 5.7 5.0  NEUTROABS 3.7  --   --   HGB 10.7* 10.1* 10.5*  HCT 34.9* 32.4* 32.6*  MCV 93.8 92.6 89.1  PLT 173 180 245   Cardiac Enzymes: No results for input(s): CKTOTAL, CKMB, CKMBINDEX, TROPONINI in the last 168 hours. BNP: Invalid input(s): POCBNP CBG: Recent Labs  Lab 04/21/24 1206 04/21/24 1549 04/21/24 2041 04/22/24 0538 04/22/24 1129  GLUCAP 152* 198* 132* 117* 160*   D-Dimer No results for input(s): DDIMER in the last 72 hours. Hgb A1c No results for input(s): HGBA1C in the last 72 hours. Lipid Profile No results for input(s): CHOL, HDL, LDLCALC, TRIG, CHOLHDL, LDLDIRECT in the last 72 hours. Thyroid  function studies No results for input(s): TSH, T4TOTAL, T3FREE, THYROIDAB in the last 72 hours.  Invalid input(s): FREET3 Anemia work up No results for input(s): VITAMINB12, FOLATE, FERRITIN, TIBC, IRON, RETICCTPCT in the last 72 hours. Urinalysis    Component Value Date/Time   COLORURINE STRAW (A) 08/05/2020 2318   APPEARANCEUR CLEAR 08/05/2020 2318   LABSPEC 1.006 08/05/2020 2318   PHURINE 6.0 08/05/2020 2318   GLUCOSEU NEGATIVE 08/05/2020 2318   HGBUR NEGATIVE 08/05/2020 2318   BILIRUBINUR NEGATIVE 08/05/2020 2318   KETONESUR NEGATIVE 08/05/2020 2318   PROTEINUR NEGATIVE 08/05/2020 2318   NITRITE NEGATIVE 08/05/2020 2318   LEUKOCYTESUR NEGATIVE 08/05/2020 2318   Sepsis Labs Recent Labs  Lab 04/16/24 1459 04/17/24 0530  04/22/24 0446  WBC 5.1 5.7 5.0   Microbiology No results found for this or any previous visit (from the past 240 hours).   Time coordinating discharge: Over 30 minutes  SIGNED:   Camellia PARAS [place], DO  [facility] 04/22/2024, 12:36 PM

## 2024-04-22 NOTE — Progress Notes (Signed)
 PT Cancellation Note  Patient Details Name: Carrie Haynes MRN: [id number] DOB: Jun 29, 1941   Cancelled Treatment:    Reason Eval/Treat Not Completed: Patient declined, no reason specified (pt on toilet on arrival but reports moving without assist in room, back to baseline and plan for D/C today. Pt declined P.T. reattempt this date)   Lenoard NOVAK [place] Provencio 04/22/2024, 11:01 AM Lenoard SQUIBB, PT Acute Rehabilitation Services Office: [phone]

## 2024-04-22 NOTE — Procedures (Signed)
 Modified Barium Swallow Study  Patient Details  Name: Carrie Haynes MRN: [id number] Date of Birth: Mar 02, 1941  Today's Date: 04/22/2024  Modified Barium Swallow completed.  Full report located under Chart Review in the Imaging Section.  History of Present Illness Pt is an 83 y.o. female who presented with bilateral lower extremity edema and worsening shortness of breath. CXR 7/1 negative for acute changes. Per referring MD's note, While drinking water this morning, patient had an aspiration event desaturating down to the 70s with slow recovery. PMH: interstitial lung disease, chronic hypoxic respiratory failure, type 2 diabetes mellitus, hypertension, atrial fibrillation on Xarelto , and pulmonary hypertension.   Clinical Impression Pt presents with grossly normal swallow function.  There was no penetration or aspiration of any consistencies trialed, including straw sips of thin liquid dairy.  Pt exhibited prompt and appropriate oral response to bolus trails with good oral clearance.  During esophageal sweep with pill simulation there was retention of contrast in the esophagus with backflow noted.  Barium tablet transited through esophagus without difficulty.  Appearance of possible cervical osteophytes noted (see image below), but without impeding bolus flow through esophagus. Pt has no further ST needs for swallowing.  Consider further assessment for GERD and/or esophageal dysphagia/dysmotility if symptoms persist.  Recommend continuing regular diet with thin liquids with general aspiration precautions. Factors that may increase risk of adverse event in presence of aspiration Noe & Lianne 2021):  None  DIGEST Swallow Severity Rating*  Safety: 0  Efficiency: 0  Overall Pharyngeal Swallow Severity: 0 1: mild; 2: moderate; 3: severe; 4: profound  *The Dynamic Imaging Grade of Swallowing Toxicity is standardized for the head and neck cancer population, however, demonstrates promising  clinical applications across populations to standardize the clinical rating of pharyngeal swallow safety and severity.  Possible cervical osteophytes:   Swallow Evaluation Recommendations Recommendations: PO diet PO Diet Recommendation: Regular;Thin liquids (Level 0) Liquid Administration via: Cup;Straw Medication Administration: Whole meds with liquid Supervision: Patient able to self-feed Swallowing strategies  : Slow rate;Small bites/sips Postural changes: Position pt fully upright for meals;Stay upright 30-60 min after meals Oral care recommendations: Oral care BID (2x/day) Recommended consults: Consider esophageal assessment      Terriana Barreras E Avondre Richens 04/22/2024,9:40 AM

## 2024-04-22 NOTE — TOC Transition Note (Signed)
 Transition of Care [facility]) - Discharge Note   Patient Details  Name: Carrie Haynes MRN: [id number] Date of Birth: August 24, 1941  [facility] CM/SW Contact:  Waddell Barnie Rama, RN Phone Number: 04/22/2024, 12:34 PM   Clinical Narrative:    For dc today, daughter is at the bedside, and the bsc is at the bedside also.  She does not want HH services.         Patient Goals and CMS Choice            Discharge Placement                       Discharge Plan and Services Additional resources added to the After Visit Summary for     Discharge Planning Services: CM Consult            DME Arranged: Bedside commode DME Agency: [employer] Date DME Agency Contacted: 04/20/24 Time DME Agency Contacted: 1147 Representative spoke with at DME Agency: [place] HH Arranged: Patient Refused HH          Social Drivers of Health (SDOH) Interventions SDOH Screenings   Food Insecurity: Patient Declined (04/17/2024)  Housing: Patient Declined (04/17/2024)  Transportation Needs: Patient Declined (04/17/2024)  Utilities: Patient Declined (04/17/2024)  Social Connections: Patient Declined (04/17/2024)  Tobacco Use: Low Risk  (04/17/2024)     Readmission Risk Interventions    04/17/2024    4:26 PM  Readmission Risk Prevention Plan  Transportation Screening Complete  PCP or Specialist Appt within 5-7 Days Complete  Home Care Screening Complete  Medication Review (RN CM) Complete

## 2024-04-26 ENCOUNTER — Other Ambulatory Visit: Payer: Self-pay

## 2024-05-01 ENCOUNTER — Other Ambulatory Visit: Payer: Self-pay

## 2024-05-01 NOTE — Progress Notes (Signed)
 Specialty Pharmacy Refill Coordination Note  Carrie Haynes is a 83 y.o. female contacted today regarding refills of specialty medication(s) Pirfenidone    Patient requested Delivery   Delivery date: 05/03/24   Verified address: [address] [place], [place] [address]   Medication will be filled on 05/02/24.

## 2024-05-03 DIAGNOSIS — I5032 Chronic diastolic (congestive) heart failure: Secondary | ICD-10-CM | POA: Diagnosis not present

## 2024-05-03 DIAGNOSIS — I480 Paroxysmal atrial fibrillation: Secondary | ICD-10-CM | POA: Diagnosis not present

## 2024-05-03 DIAGNOSIS — E1169 Type 2 diabetes mellitus with other specified complication: Secondary | ICD-10-CM | POA: Diagnosis not present

## 2024-05-03 DIAGNOSIS — I420 Dilated cardiomyopathy: Secondary | ICD-10-CM | POA: Diagnosis not present

## 2024-05-14 ENCOUNTER — Ambulatory Visit: Attending: Cardiovascular Disease | Admitting: Cardiovascular Disease

## 2024-05-14 ENCOUNTER — Encounter: Payer: Self-pay | Admitting: Cardiovascular Disease

## 2024-05-14 VITALS — BP 94/53 | HR 81 | Ht 60.0 in | Wt 196.0 lb

## 2024-05-14 DIAGNOSIS — E871 Hypo-osmolality and hyponatremia: Secondary | ICD-10-CM

## 2024-05-14 DIAGNOSIS — I2781 Cor pulmonale (chronic): Secondary | ICD-10-CM

## 2024-05-14 DIAGNOSIS — I2721 Secondary pulmonary arterial hypertension: Secondary | ICD-10-CM | POA: Diagnosis not present

## 2024-05-14 DIAGNOSIS — D6869 Other thrombophilia: Secondary | ICD-10-CM

## 2024-05-14 DIAGNOSIS — E119 Type 2 diabetes mellitus without complications: Secondary | ICD-10-CM

## 2024-05-14 DIAGNOSIS — Z5181 Encounter for therapeutic drug level monitoring: Secondary | ICD-10-CM

## 2024-05-14 DIAGNOSIS — I50812 Chronic right heart failure: Secondary | ICD-10-CM | POA: Diagnosis not present

## 2024-05-14 DIAGNOSIS — J84112 Idiopathic pulmonary fibrosis: Secondary | ICD-10-CM

## 2024-05-14 DIAGNOSIS — I10 Essential (primary) hypertension: Secondary | ICD-10-CM

## 2024-05-14 DIAGNOSIS — I4819 Other persistent atrial fibrillation: Secondary | ICD-10-CM | POA: Diagnosis not present

## 2024-05-14 DIAGNOSIS — Z79899 Other long term (current) drug therapy: Secondary | ICD-10-CM

## 2024-05-14 DIAGNOSIS — J9611 Chronic respiratory failure with hypoxia: Secondary | ICD-10-CM

## 2024-05-14 MED ORDER — RIVAROXABAN 20 MG PO TABS: 20.0000 mg | ORAL_TABLET | Freq: Every day | ORAL | 1 refills | Status: AC

## 2024-05-14 NOTE — Progress Notes (Signed)
 Virtual Visit via Video Note   Because of [name] co-morbid illnesses, she is at least at moderate risk for complications without adequate follow up.  This format is felt to be most appropriate for this patient at this time.  All issues noted in this document were discussed and addressed.  A limited physical exam was performed with this format.  Please refer to the patient's chart for her consent to telehealth for [facility].       Date:  05/14/2024   ID:  Carrie Haynes, DOB 06/23/1941, MRN [id number] The patient was identified using 2 identifiers.  Patient Location: Home Provider Location: Office/Clinic   PCP:  Teresa Channel, MD   [name] Cardiologist:  Jerel Balding, MD     Evaluation Performed:  Follow-Up Visit  Chief Complaint: Recent hospitalization follow-up   History of Present Illness:    Carrie Haynes is a 83 y.o. 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  She has severe pulmonary artery hypertension primarily due to [facility] group 3, possibly also group 4 mechanism (by cardiac catheterization in September 2024 70/10 mmHg in the setting of a normal PAWP of 10 mmHg and low normal cardiac index 2.2 L/min/m).  She was recently hospitalized July 1 - April 22, 2024 with shortness of breath and lower extremity edema.  She was in normal sinus rhythm.  BNP was elevated at 532.  Echo again showed normal LVEF 55 to 60% and only grade 1 diastolic dysfunction.  She received initially intravenous then oral diuretics.  She had an aspiration event during the hospitalization, desaturating down to 70% range.  She underwent a modified barium swallow that showed evidence of potential esophageal  dysphagia/dysmotility.  Follow-up with GI was recommended.  Mild hyponatremia 130 was noted during the hospitalization.  QTc on her ECG was 482 ms.  She feels okay at rest but if she tries to walk she often develops epigastric fullness and right upper quadrant discomfort.  She has not had any lower extremity edema.  She does not have orthopnea or PND, but often feels that her breathing is better if she lies on her left side, rather than on the right side.  She typically uses oxygen  2 L/min at rest and increases it to 3 to 4 L/min while she is walking.  She has not had exertional chest pain, palpitations, syncope.  She denies neurological complaints, falls, bleeding problems.  She has been maintaining sinus rhythm.  QTc today is in acceptable range at 444 ms (on dofetilide ).    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  Previous pulmonary function tests have shown mild-moderate reduction in FVC (65-75% of predicted, DLCO corrects for alveolar volume).  She did receive amiodarone  for a couple of months in 2018.    Initially diagnosed with atrial fibrillation with rapid ventricular response in July 2018 when she presented with tachycardia cardiomyopathy.  Her echo showed mildly depressed LVEF at 45% with diffuse hypokinesis and moderate mitral regurgitation, mildly dilated left atrium, but also a moderately dilated right atrium with moderate to severe tricuspid regurgitation and mild pulmonary hypertension.  Coronary angiography showed no evidence of significant CAD.  Rate control was difficult.  She underwent cardioversion on August 2 unsuccessfully, but eventually after loading with IV amiodarone  was  successfully cardioverted on August 7. 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  Amiodarone  was stopped in November 2018. Follow-up echocardiogram shows  complete normalization of left ventricular systolic function, EF 55-60%, with some residual signs of impaired relaxation.  A CT of the chest performed in July 2020 did show evidence of mild-moderate pulmonary fibrosis, but this was unchanged from the previous CT in 2018.    In November 2020 she presented with recurrent shortness of breath and was found to again have atrial fibrillation.  She was in rapid ventricular response but was unaware of the arrhythmia.  Echocardiography shows her EF has again dropped to 45-50%.  She was hospitalized for dofetilide  loading, converted to sinus rhythm without electrical cardioversion during antiarrhythmic administration.     Allergies:   Calcium  channel blockers, Estrogens conjugated, and Nystatin     Recent Labs: 11/21/2023: Pro B Natriuretic peptide (BNP) 323.0 04/16/2024: ALT 20 04/18/2024: B Natriuretic Peptide 466.1 04/22/2024: BUN 16; Creatinine, Ser 0.75; Hemoglobin 10.5; Magnesium  1.9; Platelets 245; Potassium 3.7; Sodium 127      12/12/2022 Cholesterol 181, HDL 65, LDL 99, triglycerides 97, hemoglobin A1c 7.0% 06/26/2023 hemoglobin A1c 6.8%, hemoglobin 11.6, creatinine 1.0, potassium 4.8  Studies Reviewed: SABRA   EKG Interpretation Date/Time:  Friday July 21 2023 08:31:34 EDT Ventricular Rate:  66 PR Interval:  178 QRS Duration:  82 QT Interval:  424 QTC Calculation: 444 R Axis:   -52  Text Interpretation: Normal sinus rhythm with sinus arrhythmia Low voltage QRS Left anterior fascicular block Cannot rule out Anterior infarct , age undetermined When compared with ECG of 23-Jun-2023 07:49, No significant change was found Confirmed by Marijayne Rauth [id number]) on 07/21/2023 8:39:08 AM     Risk Assessment/Calculations:    CHA2DS2-VASc Score = 6   This indicates a 9.7% annual risk of stroke. The patient's score is based upon: CHF History: 1 HTN History: 1 Diabetes History: 1 Stroke History: 0 Vascular Disease History: 0 Age Score: 2 Gender  Score: 1     Physical Exam:    VS:  BP (!) 94/53   Pulse 81   Ht 5' (1.524 m)   Wt 196 lb (88.9 kg)   SpO2 91%   BMI 38.28 kg/m     Wt Readings from Last 3 Encounters:  05/14/24 196 lb (88.9 kg)  04/22/24 212 lb 8.4 oz (96.4 kg)  02/19/24 210 lb (95.3 kg)    EKG:  An ECG dated 04/22/2024 was personally reviewed today and demonstrated: Sinus rhythm with occasional premature atrial complexes, left axis deviation with pulmonary disease pattern, QTc 482 ms  Recent Labs: 11/21/2023: Pro B Natriuretic peptide (BNP) 323.0 04/16/2024: ALT 20 04/18/2024: B Natriuretic Peptide 466.1 04/22/2024: BUN 16; Creatinine, Ser 0.75; Hemoglobin 10.5; Magnesium  1.9; Platelets 245; Potassium 3.7; Sodium 127   Recent Lipid Panel Lab Results  Component Value Date/Time   CHOL 95 04/19/2024 02:13 AM   CHOL 165 10/10/2019 10:25 AM   TRIG 60 04/19/2024 02:13 AM   HDL 36 (L) 04/19/2024 02:13 AM   HDL 52 10/10/2019 10:25 AM   CHOLHDL 2.6 04/19/2024 02:13 AM   LDLCALC 47 04/19/2024 02:13 AM   LDLCALC 95 10/10/2019 10:25 AM    Wt Readings from Last 3 Encounters:  05/14/24 196 lb (88.9 kg)  04/22/24 212 lb 8.4 oz (96.4 kg)  02/19/24 210 lb (95.3 kg)    Objective:    Vital Signs:  BP ROLLEN)  94/53   Pulse 81   Ht 5' (1.524 m)   Wt 196 lb (88.9 kg)   SpO2 91%   BMI 38.28 kg/m    VITAL SIGNS:  reviewed GEN:  no acute distress EYES:  sclerae anicteric, EOMI - Extraocular Movements Intact RESPIRATORY:  normal respiratory effort, symmetric expansion CARDIOVASCULAR:  no peripheral edema NEURO:  alert and oriented x 3, no obvious focal deficit PSYCH:  normal affect    ASSESSMENT:    1. Persistent atrial fibrillation (HCC)   2. Chronic right heart failure (HCC)   3. Hyponatremia   4. PAH (pulmonary artery hypertension) (HCC)   5. Cor pulmonale, chronic (HCC)   6. Chronic respiratory failure with hypoxia (HCC)   7. IPF (idiopathic pulmonary fibrosis) (HCC)   8. Essential hypertension   9. Severe  obesity (BMI 35.0-39.9) with comorbidity (HCC)   10. Diabetes mellitus without complication (HCC)   11. Encounter for monitoring dofetilide  therapy   12. Acquired thrombophilia (HCC)   13. Medication management       PLAN:    In order of problems listed above:  AFib: She maintained normal sinus rhythm throughout her hospitalization despite acute heart failure exacerbation, hypoxemia and electrolyte imbalances.  She has had excellent arrhythmia suppression on dofetilide .  QTc during hospitalization was acceptable at 486.  Potassium was at the lower end of normal at 3.7 at the time of discharge and we need to keep an eye on on that.  She has biatrial dilation.  Continue anticoagulation.  No history of stroke/TIA, but CHA2DS2-VASc 5 (age 49, gender, hypertension, history of CHF).   CHF: In the past she had depressed LVEF due to tachycardia cardiomyopathy but this has resolved completely and echo during the recent hospital stay confirm normal LV systolic function.  She develops edema due to right heart failure, in turn due to severe pulmonary hypertension.   Recent heart catheterization showed marked elevation in PA pressure despite excellent left heart filling pressures.  Echocardiogram during recent hospitalization also seems to show normal left heart filling pressures based on mitral inflow pattern (note the extremely low E/e' of approximately 5, consistent with very low mean left atrial pressure).  If anything, left heart circulation was underfilled at the time and additional diuresis would not be beneficial.  Weigh daily.  Adjust diuretics if her weight exceeds 200 pounds..  Functional status is impaired by pulmonary problems.  Cardiac output is limited by pulmonary hypertension and right heart dysfunction. Hyponatremia: Due to heart failure and also possibly due to her lung abnormalities.  This needs to be rechecked.  She has difficulty leaving the house.  Will try to see if we can get home health  and lab check at home.  Otherwise her next in office evaluation would be on 06/13/2024 with Dr. Theophilus. 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  Left heart failure is not a contributing factor. Cor pulmonale: Recent exacerbation of heart failure with edema, currently resolved, without any signs of right heart failure/hypervolemia (as far as I can tell with a video visit).  Multifactorial (obesity, restrictive lung disease, maybe OSA, maybe previous PE), she does not have any signs of right heart failure on exam today.  She does not have daytime hypersomnolence.   Chronic respiratory failure with hypoxia due to pulmonary fibrosis/usual interstitial pneumonia: On chronic O2 now up to 5 L/min.  Longstanding history of probable usual interstitial  pneumonia pattern going back for over 10 years without identifiable autoimmune disorder or clear causal environmental exposure.   Remains on treatment with pirfenidone .  Clearly some part of her restriction is due to her obesity, but high-resolution CT also showed marked progression of widespread areas of groundglass attenuation and evidence of fibrosis, without frank honeycombing.   HTN: Her blood pressure is low, a consequence of reduced left heart filling from severe pulmonary hypertension.  Losartan  has been stopped and her dose of metoprolol  has been reduced. Obesity: Morbid obesity is part of her restrictive lung disease, but interstitial lung disease appears to be the dominant problem. DM: Acceptable control, most recent hemoglobin A1c 7.3%. Dofetilide  monitoring: QTc in appropriate range.  Labs during hospitalization showed potassium and renal function were normal.  Reviewed the importance of being aware of potential drug interactions with this medication. Anticoagulation: She has frequent nosebleeds, but no serious bleeding on Xarelto  anticoagulation.   Most recent hemoglobin stable around 10.           Time:   Today, I have spent 25 minutes with the patient with telehealth technology discussing the above problems.   Dispo: Check weight daily and call if it exceeds 200 pounds.  F/u 6 months  Signed, Jerel Balding, MD

## 2024-05-14 NOTE — Patient Instructions (Signed)
 Medication Instructions:  Your physician recommends that you continue on your current medications as directed. Please refer to the Current Medication list given to you today.  *If you need a refill on your cardiac medications before your next appointment, please call your pharmacy*  Lab Work: BMET If you have labs (blood work) drawn today and your tests are completely normal, you will receive your results only by: MyChart Message (if you have MyChart) OR A paper copy in the mail If you have any lab test that is abnormal or we need to change your treatment, we will call you to review the results.  Testing/Procedures: None ordered.   Follow-Up: At [facility], you and your health needs are our priority.  As part of our continuing mission to provide you with exceptional heart care, our providers are all part of one team.  This team includes your primary Cardiologist (physician) and Advanced Practice Providers or APPs (Physician Assistants and Nurse Practitioners) who all work together to provide you with the care you need, when you need it.  Your next appointment:   6 months with Dr Francyne  We recommend signing up for the patient portal called MyChart.  Sign up information is provided on this After Visit Summary.  MyChart is used to connect with patients for Virtual Visits (Telemedicine).  Patients are able to view lab/test results, encounter notes, upcoming appointments, etc.  Non-urgent messages can be sent to your provider as well.   To learn more about what you can do with MyChart, go to [email or web address].   Other Instructions Please weigh daily and contact Dr Francyne for weight greater than 200 pounds.

## 2024-05-16 DIAGNOSIS — I5032 Chronic diastolic (congestive) heart failure: Secondary | ICD-10-CM | POA: Diagnosis not present

## 2024-05-16 DIAGNOSIS — I420 Dilated cardiomyopathy: Secondary | ICD-10-CM | POA: Diagnosis not present

## 2024-05-16 DIAGNOSIS — I480 Paroxysmal atrial fibrillation: Secondary | ICD-10-CM | POA: Diagnosis not present

## 2024-05-16 DIAGNOSIS — I129 Hypertensive chronic kidney disease with stage 1 through stage 4 chronic kidney disease, or unspecified chronic kidney disease: Secondary | ICD-10-CM | POA: Diagnosis not present

## 2024-05-16 DIAGNOSIS — E1169 Type 2 diabetes mellitus with other specified complication: Secondary | ICD-10-CM | POA: Diagnosis not present

## 2024-05-22 DIAGNOSIS — D509 Iron deficiency anemia, unspecified: Secondary | ICD-10-CM | POA: Diagnosis not present

## 2024-05-22 DIAGNOSIS — I5032 Chronic diastolic (congestive) heart failure: Secondary | ICD-10-CM | POA: Diagnosis not present

## 2024-05-22 DIAGNOSIS — K429 Umbilical hernia without obstruction or gangrene: Secondary | ICD-10-CM | POA: Diagnosis not present

## 2024-05-22 DIAGNOSIS — Z860100 Personal history of colon polyps, unspecified: Secondary | ICD-10-CM | POA: Diagnosis not present

## 2024-05-22 DIAGNOSIS — K219 Gastro-esophageal reflux disease without esophagitis: Secondary | ICD-10-CM | POA: Diagnosis not present

## 2024-05-22 DIAGNOSIS — M19041 Primary osteoarthritis, right hand: Secondary | ICD-10-CM | POA: Diagnosis not present

## 2024-05-22 DIAGNOSIS — J84112 Idiopathic pulmonary fibrosis: Secondary | ICD-10-CM | POA: Diagnosis not present

## 2024-05-22 DIAGNOSIS — E1122 Type 2 diabetes mellitus with diabetic chronic kidney disease: Secondary | ICD-10-CM | POA: Diagnosis not present

## 2024-05-22 DIAGNOSIS — E559 Vitamin D deficiency, unspecified: Secondary | ICD-10-CM | POA: Diagnosis not present

## 2024-05-22 DIAGNOSIS — Z6835 Body mass index (BMI) 35.0-35.9, adult: Secondary | ICD-10-CM | POA: Diagnosis not present

## 2024-05-22 DIAGNOSIS — E871 Hypo-osmolality and hyponatremia: Secondary | ICD-10-CM | POA: Diagnosis not present

## 2024-05-22 DIAGNOSIS — K760 Fatty (change of) liver, not elsewhere classified: Secondary | ICD-10-CM | POA: Diagnosis not present

## 2024-05-22 DIAGNOSIS — I130 Hypertensive heart and chronic kidney disease with heart failure and stage 1 through stage 4 chronic kidney disease, or unspecified chronic kidney disease: Secondary | ICD-10-CM | POA: Diagnosis not present

## 2024-05-22 DIAGNOSIS — H409 Unspecified glaucoma: Secondary | ICD-10-CM | POA: Diagnosis not present

## 2024-05-22 DIAGNOSIS — M19042 Primary osteoarthritis, left hand: Secondary | ICD-10-CM | POA: Diagnosis not present

## 2024-05-22 DIAGNOSIS — I420 Dilated cardiomyopathy: Secondary | ICD-10-CM | POA: Diagnosis not present

## 2024-05-22 DIAGNOSIS — J9611 Chronic respiratory failure with hypoxia: Secondary | ICD-10-CM | POA: Diagnosis not present

## 2024-05-22 DIAGNOSIS — Z86718 Personal history of other venous thrombosis and embolism: Secondary | ICD-10-CM | POA: Diagnosis not present

## 2024-05-22 DIAGNOSIS — I480 Paroxysmal atrial fibrillation: Secondary | ICD-10-CM | POA: Diagnosis not present

## 2024-05-22 DIAGNOSIS — N183 Chronic kidney disease, stage 3 unspecified: Secondary | ICD-10-CM | POA: Diagnosis not present

## 2024-05-22 DIAGNOSIS — I272 Pulmonary hypertension, unspecified: Secondary | ICD-10-CM | POA: Diagnosis not present

## 2024-05-22 DIAGNOSIS — E7800 Pure hypercholesterolemia, unspecified: Secondary | ICD-10-CM | POA: Diagnosis not present

## 2024-05-22 DIAGNOSIS — J45909 Unspecified asthma, uncomplicated: Secondary | ICD-10-CM | POA: Diagnosis not present

## 2024-05-24 ENCOUNTER — Other Ambulatory Visit: Payer: Self-pay

## 2024-05-24 DIAGNOSIS — Z6835 Body mass index (BMI) 35.0-35.9, adult: Secondary | ICD-10-CM | POA: Diagnosis not present

## 2024-05-24 DIAGNOSIS — K760 Fatty (change of) liver, not elsewhere classified: Secondary | ICD-10-CM | POA: Diagnosis not present

## 2024-05-24 DIAGNOSIS — E7800 Pure hypercholesterolemia, unspecified: Secondary | ICD-10-CM | POA: Diagnosis not present

## 2024-05-24 DIAGNOSIS — E1122 Type 2 diabetes mellitus with diabetic chronic kidney disease: Secondary | ICD-10-CM | POA: Diagnosis not present

## 2024-05-24 DIAGNOSIS — I480 Paroxysmal atrial fibrillation: Secondary | ICD-10-CM | POA: Diagnosis not present

## 2024-05-24 DIAGNOSIS — E871 Hypo-osmolality and hyponatremia: Secondary | ICD-10-CM | POA: Diagnosis not present

## 2024-05-24 DIAGNOSIS — D509 Iron deficiency anemia, unspecified: Secondary | ICD-10-CM | POA: Diagnosis not present

## 2024-05-24 DIAGNOSIS — K219 Gastro-esophageal reflux disease without esophagitis: Secondary | ICD-10-CM | POA: Diagnosis not present

## 2024-05-24 DIAGNOSIS — I420 Dilated cardiomyopathy: Secondary | ICD-10-CM | POA: Diagnosis not present

## 2024-05-24 DIAGNOSIS — I272 Pulmonary hypertension, unspecified: Secondary | ICD-10-CM | POA: Diagnosis not present

## 2024-05-24 DIAGNOSIS — H409 Unspecified glaucoma: Secondary | ICD-10-CM | POA: Diagnosis not present

## 2024-05-24 DIAGNOSIS — M19041 Primary osteoarthritis, right hand: Secondary | ICD-10-CM | POA: Diagnosis not present

## 2024-05-24 DIAGNOSIS — N183 Chronic kidney disease, stage 3 unspecified: Secondary | ICD-10-CM | POA: Diagnosis not present

## 2024-05-24 DIAGNOSIS — E559 Vitamin D deficiency, unspecified: Secondary | ICD-10-CM | POA: Diagnosis not present

## 2024-05-24 DIAGNOSIS — Z860100 Personal history of colon polyps, unspecified: Secondary | ICD-10-CM | POA: Diagnosis not present

## 2024-05-24 DIAGNOSIS — J45909 Unspecified asthma, uncomplicated: Secondary | ICD-10-CM | POA: Diagnosis not present

## 2024-05-24 DIAGNOSIS — I5032 Chronic diastolic (congestive) heart failure: Secondary | ICD-10-CM | POA: Diagnosis not present

## 2024-05-24 DIAGNOSIS — J84112 Idiopathic pulmonary fibrosis: Secondary | ICD-10-CM | POA: Diagnosis not present

## 2024-05-24 DIAGNOSIS — M19042 Primary osteoarthritis, left hand: Secondary | ICD-10-CM | POA: Diagnosis not present

## 2024-05-24 DIAGNOSIS — Z86718 Personal history of other venous thrombosis and embolism: Secondary | ICD-10-CM | POA: Diagnosis not present

## 2024-05-24 DIAGNOSIS — I130 Hypertensive heart and chronic kidney disease with heart failure and stage 1 through stage 4 chronic kidney disease, or unspecified chronic kidney disease: Secondary | ICD-10-CM | POA: Diagnosis not present

## 2024-05-24 DIAGNOSIS — K429 Umbilical hernia without obstruction or gangrene: Secondary | ICD-10-CM | POA: Diagnosis not present

## 2024-05-24 DIAGNOSIS — J9611 Chronic respiratory failure with hypoxia: Secondary | ICD-10-CM | POA: Diagnosis not present

## 2024-05-28 DIAGNOSIS — K760 Fatty (change of) liver, not elsewhere classified: Secondary | ICD-10-CM | POA: Diagnosis not present

## 2024-05-28 DIAGNOSIS — N183 Chronic kidney disease, stage 3 unspecified: Secondary | ICD-10-CM | POA: Diagnosis not present

## 2024-05-28 DIAGNOSIS — I480 Paroxysmal atrial fibrillation: Secondary | ICD-10-CM | POA: Diagnosis not present

## 2024-05-28 DIAGNOSIS — J84112 Idiopathic pulmonary fibrosis: Secondary | ICD-10-CM | POA: Diagnosis not present

## 2024-05-28 DIAGNOSIS — M19041 Primary osteoarthritis, right hand: Secondary | ICD-10-CM | POA: Diagnosis not present

## 2024-05-28 DIAGNOSIS — E559 Vitamin D deficiency, unspecified: Secondary | ICD-10-CM | POA: Diagnosis not present

## 2024-05-28 DIAGNOSIS — Z6835 Body mass index (BMI) 35.0-35.9, adult: Secondary | ICD-10-CM | POA: Diagnosis not present

## 2024-05-28 DIAGNOSIS — K429 Umbilical hernia without obstruction or gangrene: Secondary | ICD-10-CM | POA: Diagnosis not present

## 2024-05-28 DIAGNOSIS — M19042 Primary osteoarthritis, left hand: Secondary | ICD-10-CM | POA: Diagnosis not present

## 2024-05-28 DIAGNOSIS — I272 Pulmonary hypertension, unspecified: Secondary | ICD-10-CM | POA: Diagnosis not present

## 2024-05-28 DIAGNOSIS — I130 Hypertensive heart and chronic kidney disease with heart failure and stage 1 through stage 4 chronic kidney disease, or unspecified chronic kidney disease: Secondary | ICD-10-CM | POA: Diagnosis not present

## 2024-05-28 DIAGNOSIS — E1122 Type 2 diabetes mellitus with diabetic chronic kidney disease: Secondary | ICD-10-CM | POA: Diagnosis not present

## 2024-05-28 DIAGNOSIS — K219 Gastro-esophageal reflux disease without esophagitis: Secondary | ICD-10-CM | POA: Diagnosis not present

## 2024-05-28 DIAGNOSIS — J45909 Unspecified asthma, uncomplicated: Secondary | ICD-10-CM | POA: Diagnosis not present

## 2024-05-28 DIAGNOSIS — H409 Unspecified glaucoma: Secondary | ICD-10-CM | POA: Diagnosis not present

## 2024-05-28 DIAGNOSIS — J9611 Chronic respiratory failure with hypoxia: Secondary | ICD-10-CM | POA: Diagnosis not present

## 2024-05-28 DIAGNOSIS — Z86718 Personal history of other venous thrombosis and embolism: Secondary | ICD-10-CM | POA: Diagnosis not present

## 2024-05-28 DIAGNOSIS — I420 Dilated cardiomyopathy: Secondary | ICD-10-CM | POA: Diagnosis not present

## 2024-05-28 DIAGNOSIS — E7800 Pure hypercholesterolemia, unspecified: Secondary | ICD-10-CM | POA: Diagnosis not present

## 2024-05-28 DIAGNOSIS — D509 Iron deficiency anemia, unspecified: Secondary | ICD-10-CM | POA: Diagnosis not present

## 2024-05-28 DIAGNOSIS — E871 Hypo-osmolality and hyponatremia: Secondary | ICD-10-CM | POA: Diagnosis not present

## 2024-05-28 DIAGNOSIS — Z860100 Personal history of colon polyps, unspecified: Secondary | ICD-10-CM | POA: Diagnosis not present

## 2024-05-28 DIAGNOSIS — I5032 Chronic diastolic (congestive) heart failure: Secondary | ICD-10-CM | POA: Diagnosis not present

## 2024-05-29 DIAGNOSIS — E871 Hypo-osmolality and hyponatremia: Secondary | ICD-10-CM | POA: Diagnosis not present

## 2024-05-29 DIAGNOSIS — K760 Fatty (change of) liver, not elsewhere classified: Secondary | ICD-10-CM | POA: Diagnosis not present

## 2024-05-29 DIAGNOSIS — J45909 Unspecified asthma, uncomplicated: Secondary | ICD-10-CM | POA: Diagnosis not present

## 2024-05-29 DIAGNOSIS — Z86718 Personal history of other venous thrombosis and embolism: Secondary | ICD-10-CM | POA: Diagnosis not present

## 2024-05-29 DIAGNOSIS — M19041 Primary osteoarthritis, right hand: Secondary | ICD-10-CM | POA: Diagnosis not present

## 2024-05-29 DIAGNOSIS — I5032 Chronic diastolic (congestive) heart failure: Secondary | ICD-10-CM | POA: Diagnosis not present

## 2024-05-29 DIAGNOSIS — I480 Paroxysmal atrial fibrillation: Secondary | ICD-10-CM | POA: Diagnosis not present

## 2024-05-29 DIAGNOSIS — J84112 Idiopathic pulmonary fibrosis: Secondary | ICD-10-CM | POA: Diagnosis not present

## 2024-05-29 DIAGNOSIS — J9611 Chronic respiratory failure with hypoxia: Secondary | ICD-10-CM | POA: Diagnosis not present

## 2024-05-29 DIAGNOSIS — Z860100 Personal history of colon polyps, unspecified: Secondary | ICD-10-CM | POA: Diagnosis not present

## 2024-05-29 DIAGNOSIS — H409 Unspecified glaucoma: Secondary | ICD-10-CM | POA: Diagnosis not present

## 2024-05-29 DIAGNOSIS — D509 Iron deficiency anemia, unspecified: Secondary | ICD-10-CM | POA: Diagnosis not present

## 2024-05-29 DIAGNOSIS — E1122 Type 2 diabetes mellitus with diabetic chronic kidney disease: Secondary | ICD-10-CM | POA: Diagnosis not present

## 2024-05-29 DIAGNOSIS — I420 Dilated cardiomyopathy: Secondary | ICD-10-CM | POA: Diagnosis not present

## 2024-05-29 DIAGNOSIS — E559 Vitamin D deficiency, unspecified: Secondary | ICD-10-CM | POA: Diagnosis not present

## 2024-05-29 DIAGNOSIS — K429 Umbilical hernia without obstruction or gangrene: Secondary | ICD-10-CM | POA: Diagnosis not present

## 2024-05-29 DIAGNOSIS — I130 Hypertensive heart and chronic kidney disease with heart failure and stage 1 through stage 4 chronic kidney disease, or unspecified chronic kidney disease: Secondary | ICD-10-CM | POA: Diagnosis not present

## 2024-05-29 DIAGNOSIS — N183 Chronic kidney disease, stage 3 unspecified: Secondary | ICD-10-CM | POA: Diagnosis not present

## 2024-05-29 DIAGNOSIS — Z6835 Body mass index (BMI) 35.0-35.9, adult: Secondary | ICD-10-CM | POA: Diagnosis not present

## 2024-05-29 DIAGNOSIS — I272 Pulmonary hypertension, unspecified: Secondary | ICD-10-CM | POA: Diagnosis not present

## 2024-05-29 DIAGNOSIS — E7800 Pure hypercholesterolemia, unspecified: Secondary | ICD-10-CM | POA: Diagnosis not present

## 2024-05-29 DIAGNOSIS — K219 Gastro-esophageal reflux disease without esophagitis: Secondary | ICD-10-CM | POA: Diagnosis not present

## 2024-05-29 DIAGNOSIS — M19042 Primary osteoarthritis, left hand: Secondary | ICD-10-CM | POA: Diagnosis not present

## 2024-05-31 ENCOUNTER — Other Ambulatory Visit: Payer: Self-pay

## 2024-06-02 DIAGNOSIS — E1169 Type 2 diabetes mellitus with other specified complication: Secondary | ICD-10-CM | POA: Diagnosis not present

## 2024-06-02 DIAGNOSIS — I5032 Chronic diastolic (congestive) heart failure: Secondary | ICD-10-CM | POA: Diagnosis not present

## 2024-06-02 DIAGNOSIS — I480 Paroxysmal atrial fibrillation: Secondary | ICD-10-CM | POA: Diagnosis not present

## 2024-06-02 DIAGNOSIS — I420 Dilated cardiomyopathy: Secondary | ICD-10-CM | POA: Diagnosis not present

## 2024-06-07 DIAGNOSIS — Z6835 Body mass index (BMI) 35.0-35.9, adult: Secondary | ICD-10-CM | POA: Diagnosis not present

## 2024-06-07 DIAGNOSIS — H409 Unspecified glaucoma: Secondary | ICD-10-CM | POA: Diagnosis not present

## 2024-06-07 DIAGNOSIS — E871 Hypo-osmolality and hyponatremia: Secondary | ICD-10-CM | POA: Diagnosis not present

## 2024-06-07 DIAGNOSIS — N183 Chronic kidney disease, stage 3 unspecified: Secondary | ICD-10-CM | POA: Diagnosis not present

## 2024-06-07 DIAGNOSIS — J9611 Chronic respiratory failure with hypoxia: Secondary | ICD-10-CM | POA: Diagnosis not present

## 2024-06-07 DIAGNOSIS — Z860100 Personal history of colon polyps, unspecified: Secondary | ICD-10-CM | POA: Diagnosis not present

## 2024-06-07 DIAGNOSIS — K219 Gastro-esophageal reflux disease without esophagitis: Secondary | ICD-10-CM | POA: Diagnosis not present

## 2024-06-07 DIAGNOSIS — J45909 Unspecified asthma, uncomplicated: Secondary | ICD-10-CM | POA: Diagnosis not present

## 2024-06-07 DIAGNOSIS — I5032 Chronic diastolic (congestive) heart failure: Secondary | ICD-10-CM | POA: Diagnosis not present

## 2024-06-07 DIAGNOSIS — I130 Hypertensive heart and chronic kidney disease with heart failure and stage 1 through stage 4 chronic kidney disease, or unspecified chronic kidney disease: Secondary | ICD-10-CM | POA: Diagnosis not present

## 2024-06-07 DIAGNOSIS — E1122 Type 2 diabetes mellitus with diabetic chronic kidney disease: Secondary | ICD-10-CM | POA: Diagnosis not present

## 2024-06-07 DIAGNOSIS — I420 Dilated cardiomyopathy: Secondary | ICD-10-CM | POA: Diagnosis not present

## 2024-06-07 DIAGNOSIS — I272 Pulmonary hypertension, unspecified: Secondary | ICD-10-CM | POA: Diagnosis not present

## 2024-06-07 DIAGNOSIS — D509 Iron deficiency anemia, unspecified: Secondary | ICD-10-CM | POA: Diagnosis not present

## 2024-06-07 DIAGNOSIS — J84112 Idiopathic pulmonary fibrosis: Secondary | ICD-10-CM | POA: Diagnosis not present

## 2024-06-07 DIAGNOSIS — K760 Fatty (change of) liver, not elsewhere classified: Secondary | ICD-10-CM | POA: Diagnosis not present

## 2024-06-07 DIAGNOSIS — Z86718 Personal history of other venous thrombosis and embolism: Secondary | ICD-10-CM | POA: Diagnosis not present

## 2024-06-07 DIAGNOSIS — M19041 Primary osteoarthritis, right hand: Secondary | ICD-10-CM | POA: Diagnosis not present

## 2024-06-07 DIAGNOSIS — E559 Vitamin D deficiency, unspecified: Secondary | ICD-10-CM | POA: Diagnosis not present

## 2024-06-07 DIAGNOSIS — M19042 Primary osteoarthritis, left hand: Secondary | ICD-10-CM | POA: Diagnosis not present

## 2024-06-07 DIAGNOSIS — E7800 Pure hypercholesterolemia, unspecified: Secondary | ICD-10-CM | POA: Diagnosis not present

## 2024-06-07 DIAGNOSIS — I480 Paroxysmal atrial fibrillation: Secondary | ICD-10-CM | POA: Diagnosis not present

## 2024-06-07 DIAGNOSIS — K429 Umbilical hernia without obstruction or gangrene: Secondary | ICD-10-CM | POA: Diagnosis not present

## 2024-06-12 DIAGNOSIS — J9611 Chronic respiratory failure with hypoxia: Secondary | ICD-10-CM | POA: Diagnosis not present

## 2024-06-12 DIAGNOSIS — J84112 Idiopathic pulmonary fibrosis: Secondary | ICD-10-CM | POA: Diagnosis not present

## 2024-06-12 DIAGNOSIS — H409 Unspecified glaucoma: Secondary | ICD-10-CM | POA: Diagnosis not present

## 2024-06-12 DIAGNOSIS — M19042 Primary osteoarthritis, left hand: Secondary | ICD-10-CM | POA: Diagnosis not present

## 2024-06-12 DIAGNOSIS — I130 Hypertensive heart and chronic kidney disease with heart failure and stage 1 through stage 4 chronic kidney disease, or unspecified chronic kidney disease: Secondary | ICD-10-CM | POA: Diagnosis not present

## 2024-06-12 DIAGNOSIS — I480 Paroxysmal atrial fibrillation: Secondary | ICD-10-CM | POA: Diagnosis not present

## 2024-06-12 DIAGNOSIS — K219 Gastro-esophageal reflux disease without esophagitis: Secondary | ICD-10-CM | POA: Diagnosis not present

## 2024-06-12 DIAGNOSIS — Z860100 Personal history of colon polyps, unspecified: Secondary | ICD-10-CM | POA: Diagnosis not present

## 2024-06-12 DIAGNOSIS — M19041 Primary osteoarthritis, right hand: Secondary | ICD-10-CM | POA: Diagnosis not present

## 2024-06-12 DIAGNOSIS — J45909 Unspecified asthma, uncomplicated: Secondary | ICD-10-CM | POA: Diagnosis not present

## 2024-06-12 DIAGNOSIS — I5032 Chronic diastolic (congestive) heart failure: Secondary | ICD-10-CM | POA: Diagnosis not present

## 2024-06-12 DIAGNOSIS — E1122 Type 2 diabetes mellitus with diabetic chronic kidney disease: Secondary | ICD-10-CM | POA: Diagnosis not present

## 2024-06-12 DIAGNOSIS — Z86718 Personal history of other venous thrombosis and embolism: Secondary | ICD-10-CM | POA: Diagnosis not present

## 2024-06-12 DIAGNOSIS — K760 Fatty (change of) liver, not elsewhere classified: Secondary | ICD-10-CM | POA: Diagnosis not present

## 2024-06-12 DIAGNOSIS — N183 Chronic kidney disease, stage 3 unspecified: Secondary | ICD-10-CM | POA: Diagnosis not present

## 2024-06-12 DIAGNOSIS — D509 Iron deficiency anemia, unspecified: Secondary | ICD-10-CM | POA: Diagnosis not present

## 2024-06-12 DIAGNOSIS — E559 Vitamin D deficiency, unspecified: Secondary | ICD-10-CM | POA: Diagnosis not present

## 2024-06-12 DIAGNOSIS — E871 Hypo-osmolality and hyponatremia: Secondary | ICD-10-CM | POA: Diagnosis not present

## 2024-06-12 DIAGNOSIS — I420 Dilated cardiomyopathy: Secondary | ICD-10-CM | POA: Diagnosis not present

## 2024-06-12 DIAGNOSIS — E7800 Pure hypercholesterolemia, unspecified: Secondary | ICD-10-CM | POA: Diagnosis not present

## 2024-06-12 DIAGNOSIS — K429 Umbilical hernia without obstruction or gangrene: Secondary | ICD-10-CM | POA: Diagnosis not present

## 2024-06-12 DIAGNOSIS — I272 Pulmonary hypertension, unspecified: Secondary | ICD-10-CM | POA: Diagnosis not present

## 2024-06-12 DIAGNOSIS — Z6835 Body mass index (BMI) 35.0-35.9, adult: Secondary | ICD-10-CM | POA: Diagnosis not present

## 2024-06-13 ENCOUNTER — Encounter: Payer: Self-pay | Admitting: Pulmonary Disease

## 2024-06-13 ENCOUNTER — Other Ambulatory Visit (HOSPITAL_COMMUNITY): Payer: Self-pay

## 2024-06-13 ENCOUNTER — Telehealth: Admitting: Pulmonary Disease

## 2024-06-13 VITALS — BP 87/77 | HR 71 | Ht 60.0 in | Wt 195.0 lb

## 2024-06-13 DIAGNOSIS — I272 Pulmonary hypertension, unspecified: Secondary | ICD-10-CM

## 2024-06-13 DIAGNOSIS — J84112 Idiopathic pulmonary fibrosis: Secondary | ICD-10-CM | POA: Diagnosis not present

## 2024-06-13 DIAGNOSIS — Z5181 Encounter for therapeutic drug level monitoring: Secondary | ICD-10-CM

## 2024-06-13 NOTE — Progress Notes (Signed)
 Carrie Haynes    [id number]    05-14-1941  Primary Care Physician:White, Montie, MD  Referring Physician: Teresa Montie, MD [phone] [address]ICA[address] Carrie Haynes [address] [place],  [place] [address]  Virtual Visit via Video Note  I connected with Carrie Haynes on 06/13/24 at 11:15 AM EDT by a video enabled telemedicine application and verified that I am speaking with the correct person using two identifiers.  Location: Patient: Home Provider: Office at [facility]   I discussed the limitations of evaluation and management by telemedicine and the availability of in person appointments. The patient expressed understanding and agreed to proceed.  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  She was started on supplemental oxygen  1 year ago Has chronic cough.  Denies any wheezing, sputum production, fevers, chills  History significant for atrial fibrillation for which she was on Xarelto .  She was on amiodarone  briefly for 3 months from July 2018 to October 2018  Started Esbriet  in 2022 Right heart catheterization in 2025 confirm pulmonary hypertension.  Started Tyvaso  in February 2025    Interim history: Discussed the use of AI scribe software for clinical note transcription with the patient, who gave verbal consent to proceed.  History of Present Illness Carrie Haynes is an 83 year old female with idiopathic pulmonary fibrosis and pulmonary hypertension who presents for a follow-up visit. She is accompanied by her husband.  Peripheral edema and fluid retention - Hospitalized last month for swelling and fluid retention, primarily in the legs and feet - Describes legs and feet  as feeling tight - No associated pain with the swelling - History of poor circulation in the lower extremities - Excess fluid was removed during hospitalization - Started on Lasix  for ongoing management of fluid retention  Idiopathic pulmonary fibrosis - Requires 4 liters of supplemental oxygen  - Takes Esbriet  (pirfenidone ) twice daily - Difficulty adhering to the recommended three times daily dosing due to changes in daily routine after retirement  Pulmonary hypertension - Previously treated with Tyvaso , started treatment in May 2025 and discontinued after two months due to significant discomfort - Inhalation process with Tyvaso  caused coughing and was irritating, leading to strong aversion and discontinuation of the medication  Laboratory findings - Recent labs showed slightly low sodium level - No major concerns identified with current laboratory results   Relevant pulmonary history Pets: No pets Occupation: Retired Sales executive Exposures: No mold, hot tub, Jacuzzi.  No feather pillows or comforter ILD questionnaire 08/06/2021-negative Smoking history: Never smoker Travel history: No significant travel history Relevant family history: No family history of lung disease  Outpatient Encounter Medications as of 06/13/2024  Medication Sig   atorvastatin  (LIPITOR) 20 MG tablet Take 20 mg by mouth every evening.   Cholecalciferol  (VITAMIN D -3) 5000 units TABS Take 5,000 Units by mouth every evening.   dofetilide  (TIKOSYN ) 500 MCG capsule TAKE 1 CAPSULE BY MOUTH TWICE A DAY   furosemide  (LASIX ) 40 MG tablet Take 1 tablet (40 mg total) by mouth daily.   LUMIGAN 0.01 % SOLN Place 1 drop into both eyes at bedtime.   Magnesium  Oxide 500 MG TABS Take 500 mg by mouth every evening.   metFORMIN (GLUCOPHAGE-XR) 500 MG 24 hr tablet  Take 500 mg by mouth at bedtime.   metoprolol  succinate (TOPROL -XL) 25 MG 24 hr tablet Take 1 tablet (25 mg total) by mouth daily.   Pirfenidone  (ESBRIET ) 801 MG TABS  Take 1 tablet (801 mg total) by mouth 3 (three) times daily with meals.   rivaroxaban  (XARELTO ) 20 MG TABS tablet Take 1 tablet (20 mg total) by mouth daily with supper.   No facility-administered encounter medications on file as of 06/13/2024.   Physical Exam: Tele  Data Reviewed: Imaging: CT high-resolution 07/21/2021- Worsening of interstitial lung disease and probable UIP pattern, dilatation of pulmonary trunk, moderate cardiomegaly, atherosclerosis and three-vessel coronary disease  CT high-resolution 07/25/2022 Pulmonary pattern of fibrosis which looks unchanged  High resolution CT 08/26/2023-stable pattern of pulmonary fibrosis.  Chest x-ray 04/21/2024-no acute airspace disease, stable chronic interstitial lung disease I have reviewed the images personally.  PFTs: 10/23/2017 FVC 1.76 [74%], FEV1 1.66 [93%], F/F 94, TLC 2.65 [59%], DLCO 9.34 [49%] Moderate restriction, severe diffusion defect  04/26/2022 FVC 1.28 [57%], FEV1 1.19 [72%], F/F 93, TLC 2.78 [62%], DLCO 9.62 [58%] Moderate restriction and diffusion defect.  11/21/2023 FVC 1.28 [59%], FEV1 1.11 [70%], F/F87, TLC 2.53 [56%], DLCO 5.17 [31%] Severe restriction, diffusion defect.  Worsening compared to 2023  Labs: CTD serologies 08/06/2021-negative proBNP 08/06/2021-742 Hepatic panel 09/16/2021-within normal limits  Labs from primary care dated 12/15/2021 CBC-WBC 8, hemoglobin 11.3, platelets 228, eosinophils 0.8%, BMP within normal limits.    Hepatic panel 04/26/2022 - within normal limits  Cardiac: Right heart catheterization 06/23/2023 RA = 4 RV = 71/10 PA = 68/21 (42) PCW = 10 Fick cardiac output/index = 4.2/2.2 PVR = 7.5 WU FA sat = 98% PA sat = 59% PAPi = 11.8 Moderate PAH with normal left sided pressures. Most likely WHO group 3 in setting of ILD and FEV 1.1 Moderately reduced CO with normal PAPi  Echocardiogram 04/17/2024-LVEF 55 to 60%, D-shaped ventricular septum, severely elevated PA systolic  pressure Assessment & Plan Idiopathic pulmonary fibrosis She has progressive pulmonary fibrosis dating back to over 10 years and probable UIP pattern. No significant CTD symptoms or exposures. CTD serologies and HP panel are negative. High probability for being IPF. Currently on Esbriet  (pirfenidone ) with good tolerance but not taking the full dose due to scheduling difficulties. 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  She had recent hospitalization for heart failure with echocardiogram showing worsening pulmonary hypertension.  Previous Tyvaso  in powdered form caused irritation and coughing. - Switch Tyvaso  to nebulizer form to improve tolerance and manage condition, potentially reducing hospitalizations.  Will send message to pharmacy team - Continue Lasix  (furosemide ) - Obtain recent lab work from [facility] - Coordinate with pharmacy for nebulizer prescription  Chronic lower extremity edema Chronic lower extremity edema likely secondary to pulmonary hypertension and right heart failure. - Continue Lasix  (furosemide )  Goals of Care Aims to reach age 52, prioritizing stability in current health status. Prefers managing conditions with existing regimen and is open to video consultations to minimize travel burden.  Plan/Recommendations: Continue Esbriet .  Encouraged to take 3 times a day Switch Tyvaso  to nebulized form Monitor labs.  Obtain recent lab test from primary care Follow-up in 6 months  I discussed the assessment and treatment plan with the patient. The patient was provided an opportunity to ask questions and all were answered. The patient agreed with the plan and  demonstrated an understanding of the instructions.   The patient was advised to call back or seek an in-person evaluation if the symptoms  worsen or if the condition fails to improve as anticipated.  Carrie Coder MD [facility] 06/13/2024, 11:32 AM  CC: Teresa Channel, MD

## 2024-06-13 NOTE — Patient Instructions (Signed)
  VISIT SUMMARY: Today, you had a follow-up visit to address your idiopathic pulmonary fibrosis, pulmonary hypertension, and related symptoms. We discussed your recent hospitalization for swelling and fluid retention, and reviewed your current medications and lab results.  YOUR PLAN: -IDIOPATHIC PULMONARY FIBROSIS: Idiopathic pulmonary fibrosis is a lung disease that causes scarring of the lungs, making it difficult to breathe. You are currently taking Esbriet  (pirfenidone ) twice daily, and we recommend continuing this medication. Although you are having difficulty taking it three times daily, it is important to maintain the current dosage for now.  -SEVERE PULMONARY HYPERTENSION WITH RIGHT HEART FAILURE: Pulmonary hypertension is high blood pressure in the lungs' arteries, which can lead to right heart failure. You have been experiencing fluid retention and swelling in your legs and feet. We will switch your Tyvaso  medication to a nebulizer form to improve your tolerance and help manage your condition. Continue taking Lasix  (furosemide ) to help reduce fluid retention. We will also obtain recent lab work from [facility] and coordinate with the pharmacy for your new prescription.  -CHRONIC LOWER EXTREMITY EDEMA: Chronic lower extremity edema is persistent swelling in the legs, often due to underlying conditions like pulmonary hypertension and heart failure. Continue taking Lasix  (furosemide ) to manage this swelling.  INSTRUCTIONS: Please follow up with recent lab work from [facility]. We will also coordinate with the pharmacy to get your Tyvaso  medication in nebulizer form. Continue taking your current medications as prescribed.

## 2024-06-16 DIAGNOSIS — I420 Dilated cardiomyopathy: Secondary | ICD-10-CM | POA: Diagnosis not present

## 2024-06-16 DIAGNOSIS — I5032 Chronic diastolic (congestive) heart failure: Secondary | ICD-10-CM | POA: Diagnosis not present

## 2024-06-16 DIAGNOSIS — I480 Paroxysmal atrial fibrillation: Secondary | ICD-10-CM | POA: Diagnosis not present

## 2024-06-16 DIAGNOSIS — E1169 Type 2 diabetes mellitus with other specified complication: Secondary | ICD-10-CM | POA: Diagnosis not present

## 2024-06-16 DIAGNOSIS — I129 Hypertensive chronic kidney disease with stage 1 through stage 4 chronic kidney disease, or unspecified chronic kidney disease: Secondary | ICD-10-CM | POA: Diagnosis not present

## 2024-06-20 ENCOUNTER — Other Ambulatory Visit: Payer: Self-pay

## 2024-06-20 ENCOUNTER — Telehealth: Payer: Self-pay

## 2024-06-20 NOTE — Progress Notes (Signed)
 Specialty Pharmacy Refill Coordination Note  Carrie Haynes is a 83 y.o. female contacted today regarding refills of specialty medication(s) Pirfenidone    Patient requested Delivery   Delivery date: 07/11/24   Verified address: [address] [place],  [address]   Medication will be filled on 09.24.25.   Patient still has a surplus on hand. Patient aware to call if medication needed sooner

## 2024-06-20 NOTE — Telephone Encounter (Signed)
 Mannam, Praveen, MD  P Rx Rheum/Pulm; Dayne Sherry RAMAN, RPH-CPP; Veronia Aleck CROME, [facility] Patient was recently started on Tyvaso  but does not like the powdered form.  Meanwhile her pulmonary hypertension is worse and required recent hospitalization.  Can we switch her to nebulized inhaled prostacyclin?  Thank you    ----------------------------------------------------------------------------------  Completed the Tyvaso  form for the nebulized formulation and faxed in to [facility].  Phone# [phone] Fax# [phone]

## 2024-06-21 DIAGNOSIS — I480 Paroxysmal atrial fibrillation: Secondary | ICD-10-CM | POA: Diagnosis not present

## 2024-06-21 DIAGNOSIS — I130 Hypertensive heart and chronic kidney disease with heart failure and stage 1 through stage 4 chronic kidney disease, or unspecified chronic kidney disease: Secondary | ICD-10-CM | POA: Diagnosis not present

## 2024-06-21 DIAGNOSIS — J84112 Idiopathic pulmonary fibrosis: Secondary | ICD-10-CM | POA: Diagnosis not present

## 2024-06-21 DIAGNOSIS — M19042 Primary osteoarthritis, left hand: Secondary | ICD-10-CM | POA: Diagnosis not present

## 2024-06-21 DIAGNOSIS — K219 Gastro-esophageal reflux disease without esophagitis: Secondary | ICD-10-CM | POA: Diagnosis not present

## 2024-06-21 DIAGNOSIS — K760 Fatty (change of) liver, not elsewhere classified: Secondary | ICD-10-CM | POA: Diagnosis not present

## 2024-06-21 DIAGNOSIS — Z860100 Personal history of colon polyps, unspecified: Secondary | ICD-10-CM | POA: Diagnosis not present

## 2024-06-21 DIAGNOSIS — J9611 Chronic respiratory failure with hypoxia: Secondary | ICD-10-CM | POA: Diagnosis not present

## 2024-06-21 DIAGNOSIS — E7800 Pure hypercholesterolemia, unspecified: Secondary | ICD-10-CM | POA: Diagnosis not present

## 2024-06-21 DIAGNOSIS — K429 Umbilical hernia without obstruction or gangrene: Secondary | ICD-10-CM | POA: Diagnosis not present

## 2024-06-21 DIAGNOSIS — J45909 Unspecified asthma, uncomplicated: Secondary | ICD-10-CM | POA: Diagnosis not present

## 2024-06-21 DIAGNOSIS — N183 Chronic kidney disease, stage 3 unspecified: Secondary | ICD-10-CM | POA: Diagnosis not present

## 2024-06-21 DIAGNOSIS — Z86718 Personal history of other venous thrombosis and embolism: Secondary | ICD-10-CM | POA: Diagnosis not present

## 2024-06-21 DIAGNOSIS — M19041 Primary osteoarthritis, right hand: Secondary | ICD-10-CM | POA: Diagnosis not present

## 2024-06-21 DIAGNOSIS — D509 Iron deficiency anemia, unspecified: Secondary | ICD-10-CM | POA: Diagnosis not present

## 2024-06-21 DIAGNOSIS — Z6835 Body mass index (BMI) 35.0-35.9, adult: Secondary | ICD-10-CM | POA: Diagnosis not present

## 2024-06-21 DIAGNOSIS — I272 Pulmonary hypertension, unspecified: Secondary | ICD-10-CM | POA: Diagnosis not present

## 2024-06-21 DIAGNOSIS — E871 Hypo-osmolality and hyponatremia: Secondary | ICD-10-CM | POA: Diagnosis not present

## 2024-06-21 DIAGNOSIS — H409 Unspecified glaucoma: Secondary | ICD-10-CM | POA: Diagnosis not present

## 2024-06-21 DIAGNOSIS — I5032 Chronic diastolic (congestive) heart failure: Secondary | ICD-10-CM | POA: Diagnosis not present

## 2024-06-21 DIAGNOSIS — I420 Dilated cardiomyopathy: Secondary | ICD-10-CM | POA: Diagnosis not present

## 2024-06-21 DIAGNOSIS — E1122 Type 2 diabetes mellitus with diabetic chronic kidney disease: Secondary | ICD-10-CM | POA: Diagnosis not present

## 2024-06-21 DIAGNOSIS — E559 Vitamin D deficiency, unspecified: Secondary | ICD-10-CM | POA: Diagnosis not present

## 2024-06-21 NOTE — Telephone Encounter (Signed)
 Received fax from [facility] confirming reception of application.

## 2024-06-27 NOTE — Telephone Encounter (Signed)
 Received email from [facility], Provider Support Associate -   Good afternoon, Thank you for the Tyvaso  referral on P. Quin 26-Mar-1941. Our nurse has reviewed documentation for the patient and we are requesting additional documentation. Requesting:          Most recent 2025 right heart catheterization (as documented in 06/13/2024 telehealth visit note, i.e., right heart catheterization in 2025 confirm pulmonary hypertension). Please fax request to [facility] [phone]. Thank you, [facility] Provider Support Associate   Faxed R heart cath from 06/20/24 to [facility] at [phone].   Emailed response:   Good morning,   Per our records in the medical chart, the most recent right heart catheterization was 06/23/23. Faxed documentation to [facility] at [phone].   The 06/13/24 telehealth visit note states, Right heart catheterization in 2025 confirm pulmonary hypertension.  Started Tyvaso  in February 2025.   The 11/21/23 visit note states, Pulmonary hypertension diagnosed last year via heart catheterization with plan to Start paperwork for Tyvaso  inhaler.   Please let us  know if you need further information from us .   Thanks, Aleck

## 2024-07-02 ENCOUNTER — Telehealth: Payer: Self-pay | Admitting: *Deleted

## 2024-07-02 DIAGNOSIS — E1169 Type 2 diabetes mellitus with other specified complication: Secondary | ICD-10-CM | POA: Diagnosis not present

## 2024-07-02 DIAGNOSIS — I420 Dilated cardiomyopathy: Secondary | ICD-10-CM | POA: Diagnosis not present

## 2024-07-02 DIAGNOSIS — Z0000 Encounter for general adult medical examination without abnormal findings: Secondary | ICD-10-CM | POA: Diagnosis not present

## 2024-07-02 DIAGNOSIS — I129 Hypertensive chronic kidney disease with stage 1 through stage 4 chronic kidney disease, or unspecified chronic kidney disease: Secondary | ICD-10-CM | POA: Diagnosis not present

## 2024-07-02 DIAGNOSIS — I480 Paroxysmal atrial fibrillation: Secondary | ICD-10-CM | POA: Diagnosis not present

## 2024-07-02 DIAGNOSIS — N183 Chronic kidney disease, stage 3 unspecified: Secondary | ICD-10-CM | POA: Diagnosis not present

## 2024-07-02 DIAGNOSIS — Z1331 Encounter for screening for depression: Secondary | ICD-10-CM | POA: Diagnosis not present

## 2024-07-02 DIAGNOSIS — J849 Interstitial pulmonary disease, unspecified: Secondary | ICD-10-CM

## 2024-07-02 DIAGNOSIS — I5032 Chronic diastolic (congestive) heart failure: Secondary | ICD-10-CM | POA: Diagnosis not present

## 2024-07-02 DIAGNOSIS — R0600 Dyspnea, unspecified: Secondary | ICD-10-CM

## 2024-07-02 DIAGNOSIS — J84112 Idiopathic pulmonary fibrosis: Secondary | ICD-10-CM

## 2024-07-02 NOTE — Telephone Encounter (Signed)
 Patient states she was given a 5 Liter concentrator instead of usual 10 Liter. Today. She wants an order for 10 Liter concentrator. Also, on 06/13/24 tele-visit she says there was mention of ordering a nebulizer.  06/13/24 Idiopathic pulmonary fibrosis - Requires 4 liters of supplemental oxygen  - Takes Esbriet  (pirfenidone ) twice daily - Difficulty adhering to the recommended three times daily dosing due to changes in daily routine after retirement

## 2024-07-04 ENCOUNTER — Other Ambulatory Visit: Payer: Self-pay

## 2024-07-04 NOTE — Telephone Encounter (Signed)
 Spoke to [facility] and was advised that the patient had spoken to [facility] earlier today about the Tyvaso . I'm not sure how she got to [facility] but she told them she did not want to take the medication anymore since she thought they were still talking about the dry powder instead of the neb.   Sean at [facility] confirmed the patient is ready to schedule shipment once she completes her Tyvaso  Consent call and does the clinical call with the pharmacist. He also stated she should be receiving another nurse visit as well.  I will follow up with the patient to tell her to reach out to [facility] to complete her calls.

## 2024-07-04 NOTE — Progress Notes (Signed)
 Specialty Pharmacy Ongoing Clinical Assessment Note  Carrie Haynes is a 83 y.o. female who is being followed by the specialty pharmacy service for RxSp Interstitial Lung Disease   Patient's specialty medication(s) reviewed today: Pirfenidone    Missed doses in the last 4 weeks: 10   Patient/Caregiver asked additional questions regarding Tyvaso  prior authorization status.  Therapeutic benefit summary: Patient is achieving benefit   Adverse events/side effects summary: No adverse events/side effects   Patient's therapy is appropriate to: Continue    Goals Addressed             This Visit's Progress    Slow Disease Progression       Patient is on track. Patient will maintain adherence. On 8/28 visit provider encourage to take 3 times daily.        Follow up: 1 year  [name] Gallus Specialty Pharmacist

## 2024-07-04 NOTE — Telephone Encounter (Signed)
 Received fax notification from [employer] that [facility] has been trying to get in touch with the patient to schedule her Tyvaso  neb shipment. Called and spoke to the patient who said she spoke to [facility] earlier today and they were needing something additional but she was not able to understand what exactly they were needing.  She also expressed that she was not explicitly told what would be happening since she informed us  she does not like the powder. I explained that we are trying to switch her to the nebulized version instead and that we were trying to work out the paperwork on the back end before confirming we will be able to get her set up with the neb. She verbalized her understanding of this and was grateful to have someone explain the process.  I advised her I will reach out to [facility] and see what additional information they are needing. The patient also expressed she expects someone to come out and show her how to use the medication again, I'm assuming she is referring to the nurse visit. I told the patient I will follow up with her and keep her posted as things move along.  [facility] direct line: [phone]

## 2024-07-05 NOTE — Telephone Encounter (Signed)
 Okay to order 10 liter concentrator as requested by the patient  Regarding the nebulizer discussion-it looks like pharmacy is already working with changing Tyvaso  to nebulized therapy.  Nothing further needed regarding this particular issue

## 2024-07-05 NOTE — Telephone Encounter (Signed)
 Spoke to the patient and advised her [facility] should be reaching out to schedule shipment and a nurse visit. I gave her their phone number in case they don't and provided her with my direct line if she has any further questions. Pt verbalized understanding.

## 2024-07-09 NOTE — Telephone Encounter (Signed)
 Order placed

## 2024-07-10 ENCOUNTER — Other Ambulatory Visit: Payer: Self-pay

## 2024-07-12 NOTE — Telephone Encounter (Signed)
 Called and spoke with the patient. Pt is very confused and doesn't understand her medication list nor what she is talking about.  Pt has never started the Tyvaso  nebulizer medication. She never received medication and was never directed on the use of the medication.   Dr. Theophilus she is very confused on her medications including Esbriet . She doesn't know what exactly she is taking.

## 2024-07-12 NOTE — Telephone Encounter (Signed)
 Please let patient know that the medication is for a condition called pulmonary hypertension and unfortunately it needs to be taken multiple times a day and taking it once a day or every so often is not effective.  Let the patient know that I encouraged her to continue to take it but I understand if she decides to stop the medication  Ramses Klecka MD [facility] 07/12/2024, 12:20 PM

## 2024-07-12 NOTE — Telephone Encounter (Signed)
 Copied from CRM [id number]. Topic: Clinical - Prescription Issue >> Jul 09, 2024  4:12 PM Rozanna MATSU wrote: Reason for CRM: Carrie Haynes with [facility] stated pt advised them she does not want to take Tyvaso  and would like an alternate something she only needs to take once a day or every so often. Pt advised [facility] she will be taking and from them not to ship the medicine.  Routing to Dr. Theophilus

## 2024-07-12 NOTE — Telephone Encounter (Signed)
 Can you please make a referral to pharmacy to see Aleck Puls to sort out her medication.  She is already on pirfenidone  [Esbriet ] which she is taking at a lower dose and we had started her on Tyvaso  but she did not like the powdered form due to cough and I had asked her to be switched to nebulized form of the medication.  Lonna Coder MD [facility] 07/12/2024, 2:11 PM

## 2024-07-15 NOTE — Telephone Encounter (Signed)
 Spoke with patient - she states I'm just not going to do it regarding use of Tyvaso  four times daily.   After discussion of overall purpose of medication and potential benefits, she is willing to try Tyvaso  nebulizer.   Provided [facility] line. [phone]   Will alert [facility] of patient's intent to try Tyvaso  neb.   Aleck Puls, PharmD, BCPS Clinical Pharmacist  [facility]

## 2024-07-16 DIAGNOSIS — I420 Dilated cardiomyopathy: Secondary | ICD-10-CM | POA: Diagnosis not present

## 2024-07-16 DIAGNOSIS — I129 Hypertensive chronic kidney disease with stage 1 through stage 4 chronic kidney disease, or unspecified chronic kidney disease: Secondary | ICD-10-CM | POA: Diagnosis not present

## 2024-07-16 DIAGNOSIS — I5032 Chronic diastolic (congestive) heart failure: Secondary | ICD-10-CM | POA: Diagnosis not present

## 2024-07-16 DIAGNOSIS — I480 Paroxysmal atrial fibrillation: Secondary | ICD-10-CM | POA: Diagnosis not present

## 2024-07-16 DIAGNOSIS — E1169 Type 2 diabetes mellitus with other specified complication: Secondary | ICD-10-CM | POA: Diagnosis not present

## 2024-07-23 NOTE — Telephone Encounter (Signed)
 Received fax from [facility] that Tyvaso  solution nebs has been shipped to patient from [facility] (phone [phone]) on 07/22/24  Sherry Pennant, PharmD, MPH, BCPS, CPP Clinical Pharmacist [facility])

## 2024-07-26 NOTE — Telephone Encounter (Signed)
 Attempted to call patient. Left voicemail for patient to call back to get scheduled.

## 2024-07-26 NOTE — Telephone Encounter (Signed)
 Received email from [facility] nurse on 07/25/24 at 4:52PM -   Patient name; Carrie Haynes DOB; September 09, 1941 Prescriber; Dr. Theophilus [facility]; 07/25/24 Therapy; Tyvaso  neb No next visit scheduled Oxygen ; yes Summary;  I saw [name] today to try and get her started on Tyvaso  neb. She had been on Tyvaso  DPI but stopped it months ago. She expressed to me she didn't like the DPI therapy due to having to be on a strict timeline and having to keep up with doing it every 4-5 hours apart, four times daily. She described that she would get so sick and stressed about doing the therapy that she would rather die than to do it. I discussed with her at length the possible benefits of therapy but when I told and showed her that she had to set-up the TD-300 pump in the morning and take apart at night and still do the treatment four times daily, 4-5 hours apart, starting at 3 breaths and titrating up,  she stated I'm not going to do that. She was upset that it would be the same regimen and it would be more steps to it.  After discussing everything with her, she was still adamant that she would not be compliant with the therapy. I asked if she was refusing to start the therapy and she agreed that she was refusing. Her partner, [name], was present at the visit and stated he knew her so well and knew she would not be compliant with therapy.   I've asked her to call Dr. Theophilus and discuss her decision with him. The med, pumps, and supplies will remain in her home and hopefully she might change her mind, but as of today, she refused starting therapy.  Thank you,  Mont Perna MSN RN [facility]  [facility] Psychologist, occupational, a part of [email or web address]

## 2024-07-26 NOTE — Telephone Encounter (Signed)
 After several attempts by pharmacy team and [facility], patient ultimately refuses to start Tyvaso  neb. Barriers include frequency of dosing. Both patient and spouse report she will not be compliant with therapy.   Last OV with Dr. Theophilus 02/19/24.  Next OV due November, not yet scheduled. Routing to scheduling team for assistance in scheduling.   Aleck Puls, PharmD, BCPS, CPP Clinical Pharmacist  [facility]

## 2024-07-29 NOTE — Telephone Encounter (Signed)
 ATC 2x left VM

## 2024-07-31 ENCOUNTER — Encounter: Payer: Self-pay | Admitting: Pulmonary Disease

## 2024-07-31 NOTE — Telephone Encounter (Signed)
 ATC 3x sent LTR

## 2024-08-01 ENCOUNTER — Inpatient Hospital Stay (HOSPITAL_COMMUNITY)
Admission: EM | Admit: 2024-08-01 | Discharge: 2024-08-10 | DRG: 291 | Disposition: A | Discharge status: Home / Self Care | Attending: Internal Medicine | Admitting: Internal Medicine

## 2024-08-01 ENCOUNTER — Other Ambulatory Visit: Payer: Self-pay

## 2024-08-01 ENCOUNTER — Telehealth: Payer: Self-pay

## 2024-08-01 DIAGNOSIS — N1831 Chronic kidney disease, stage 3a: Secondary | ICD-10-CM | POA: Diagnosis present

## 2024-08-01 DIAGNOSIS — I5033 Acute on chronic diastolic (congestive) heart failure: Secondary | ICD-10-CM | POA: Diagnosis not present

## 2024-08-01 DIAGNOSIS — E1169 Type 2 diabetes mellitus with other specified complication: Secondary | ICD-10-CM | POA: Diagnosis not present

## 2024-08-01 DIAGNOSIS — Z9981 Dependence on supplemental oxygen: Secondary | ICD-10-CM

## 2024-08-01 DIAGNOSIS — I071 Rheumatic tricuspid insufficiency: Secondary | ICD-10-CM | POA: Diagnosis present

## 2024-08-01 DIAGNOSIS — J9621 Acute and chronic respiratory failure with hypoxia: Secondary | ICD-10-CM | POA: Diagnosis present

## 2024-08-01 DIAGNOSIS — Z91199 Patient's noncompliance with other medical treatment and regimen due to unspecified reason: Secondary | ICD-10-CM

## 2024-08-01 DIAGNOSIS — N179 Acute kidney failure, unspecified: Secondary | ICD-10-CM | POA: Diagnosis present

## 2024-08-01 DIAGNOSIS — Z7901 Long term (current) use of anticoagulants: Secondary | ICD-10-CM

## 2024-08-01 DIAGNOSIS — J84112 Idiopathic pulmonary fibrosis: Secondary | ICD-10-CM | POA: Diagnosis present

## 2024-08-01 DIAGNOSIS — I4891 Unspecified atrial fibrillation: Secondary | ICD-10-CM | POA: Diagnosis present

## 2024-08-01 DIAGNOSIS — Z6839 Body mass index (BMI) 39.0-39.9, adult: Secondary | ICD-10-CM

## 2024-08-01 DIAGNOSIS — I5032 Chronic diastolic (congestive) heart failure: Secondary | ICD-10-CM | POA: Diagnosis not present

## 2024-08-01 DIAGNOSIS — Z8249 Family history of ischemic heart disease and other diseases of the circulatory system: Secondary | ICD-10-CM

## 2024-08-01 DIAGNOSIS — J984 Other disorders of lung: Secondary | ICD-10-CM | POA: Diagnosis present

## 2024-08-01 DIAGNOSIS — Z79899 Other long term (current) drug therapy: Secondary | ICD-10-CM

## 2024-08-01 DIAGNOSIS — I10 Essential (primary) hypertension: Secondary | ICD-10-CM | POA: Diagnosis present

## 2024-08-01 DIAGNOSIS — K5900 Constipation, unspecified: Secondary | ICD-10-CM | POA: Diagnosis present

## 2024-08-01 DIAGNOSIS — I130 Hypertensive heart and chronic kidney disease with heart failure and stage 1 through stage 4 chronic kidney disease, or unspecified chronic kidney disease: Secondary | ICD-10-CM | POA: Diagnosis not present

## 2024-08-01 DIAGNOSIS — E7849 Other hyperlipidemia: Secondary | ICD-10-CM | POA: Diagnosis present

## 2024-08-01 DIAGNOSIS — D6869 Other thrombophilia: Secondary | ICD-10-CM | POA: Diagnosis present

## 2024-08-01 DIAGNOSIS — J449 Chronic obstructive pulmonary disease, unspecified: Secondary | ICD-10-CM | POA: Diagnosis present

## 2024-08-01 DIAGNOSIS — E871 Hypo-osmolality and hyponatremia: Secondary | ICD-10-CM | POA: Diagnosis present

## 2024-08-01 DIAGNOSIS — I420 Dilated cardiomyopathy: Secondary | ICD-10-CM | POA: Diagnosis present

## 2024-08-01 DIAGNOSIS — E1122 Type 2 diabetes mellitus with diabetic chronic kidney disease: Secondary | ICD-10-CM | POA: Diagnosis present

## 2024-08-01 DIAGNOSIS — Z23 Encounter for immunization: Secondary | ICD-10-CM

## 2024-08-01 DIAGNOSIS — E66812 Obesity, class 2: Secondary | ICD-10-CM | POA: Diagnosis present

## 2024-08-01 DIAGNOSIS — I4819 Other persistent atrial fibrillation: Secondary | ICD-10-CM | POA: Diagnosis present

## 2024-08-01 DIAGNOSIS — Z86718 Personal history of other venous thrombosis and embolism: Secondary | ICD-10-CM

## 2024-08-01 DIAGNOSIS — I480 Paroxysmal atrial fibrillation: Secondary | ICD-10-CM | POA: Diagnosis not present

## 2024-08-01 DIAGNOSIS — I272 Pulmonary hypertension, unspecified: Secondary | ICD-10-CM | POA: Diagnosis present

## 2024-08-01 DIAGNOSIS — Z7984 Long term (current) use of oral hypoglycemic drugs: Secondary | ICD-10-CM

## 2024-08-01 NOTE — ED Triage Notes (Addendum)
 Pt in from home via [facility] with sob, wears 4LNC, hx of pulmonary fibrosis. Pt reports bilateral leg swelling.  Initial RR were in 40's 80% initial sats > now 92% 71HR ETCO2 17 180/100 20G RAC

## 2024-08-01 NOTE — Telephone Encounter (Signed)
 Copied from CRM [id number]. Topic: Clinical - Order For Equipment >> Jul 31, 2024 10:22 AM Russell PARAS wrote: Reason for CRM:   Pt has decided to not use the nebulizer that was ordered by [facility]. She was advised by DME company to contact clinic and notify him. She is unable to return machine herself due to being unable to leave the house.   Requested call back to verify message was received and to speak about how to return machine.   CB#  [phone]  I called and spoke to pt. Pt states she decided she was not going to do as it seemed to be too much and she didn't fell like dealing with it 4 times a day.  Pt states she spoke to someone a couple days ago about the DME sending someone out to get it as she is unable to return it herself. NFN

## 2024-08-02 ENCOUNTER — Emergency Department (HOSPITAL_COMMUNITY)

## 2024-08-02 ENCOUNTER — Encounter (HOSPITAL_COMMUNITY): Payer: Self-pay | Admitting: Emergency Medicine

## 2024-08-02 ENCOUNTER — Other Ambulatory Visit: Payer: Self-pay

## 2024-08-02 DIAGNOSIS — I5033 Acute on chronic diastolic (congestive) heart failure: Secondary | ICD-10-CM | POA: Diagnosis not present

## 2024-08-02 DIAGNOSIS — J9621 Acute and chronic respiratory failure with hypoxia: Secondary | ICD-10-CM | POA: Diagnosis not present

## 2024-08-02 DIAGNOSIS — I4891 Unspecified atrial fibrillation: Secondary | ICD-10-CM | POA: Diagnosis not present

## 2024-08-02 DIAGNOSIS — I517 Cardiomegaly: Secondary | ICD-10-CM | POA: Diagnosis not present

## 2024-08-02 DIAGNOSIS — R0602 Shortness of breath: Secondary | ICD-10-CM | POA: Diagnosis not present

## 2024-08-02 DIAGNOSIS — M7989 Other specified soft tissue disorders: Secondary | ICD-10-CM | POA: Diagnosis not present

## 2024-08-02 DIAGNOSIS — J8410 Pulmonary fibrosis, unspecified: Secondary | ICD-10-CM | POA: Diagnosis not present

## 2024-08-02 LAB — CBC
HCT: 33.2 % — ABNORMAL LOW (ref 36.0–46.0)
Hemoglobin: 10.7 g/dL — ABNORMAL LOW (ref 12.0–15.0)
MCH: 30.5 pg (ref 26.0–34.0)
MCHC: 32.2 g/dL (ref 30.0–36.0)
MCV: 94.6 fL (ref 80.0–100.0)
Platelets: 212 K/uL (ref 150–400)
RBC: 3.51 MIL/uL — ABNORMAL LOW (ref 3.87–5.11)
RDW: 15.9 % — ABNORMAL HIGH (ref 11.5–15.5)
WBC: 6.5 K/uL (ref 4.0–10.5)
nRBC: 0 % (ref 0.0–0.2)

## 2024-08-02 LAB — TROPONIN I (HIGH SENSITIVITY)
Troponin I (High Sensitivity): 12 ng/L (ref <18)
Troponin I (High Sensitivity): 12 ng/L (ref <18)

## 2024-08-02 LAB — BASIC METABOLIC PANEL WITH GFR
Anion gap: 15 (ref 5–15)
BUN: 33 mg/dL — ABNORMAL HIGH (ref 8–23)
CO2: 23 mmol/L (ref 22–32)
Calcium: 8.7 mg/dL — ABNORMAL LOW (ref 8.9–10.3)
Chloride: 96 mmol/L — ABNORMAL LOW (ref 98–111)
Creatinine, Ser: 1.38 mg/dL — ABNORMAL HIGH (ref 0.44–1.00)
GFR, Estimated: 39 mL/min — ABNORMAL LOW (ref >60)
Glucose, Bld: 130 mg/dL — ABNORMAL HIGH (ref 70–99)
Potassium: 4.1 mmol/L (ref 3.5–5.1)
Sodium: 134 mmol/L — ABNORMAL LOW (ref 135–145)

## 2024-08-02 LAB — CBG MONITORING, ED
Glucose-Capillary: 103 mg/dL — ABNORMAL HIGH (ref 70–99)
Glucose-Capillary: 147 mg/dL — ABNORMAL HIGH (ref 70–99)

## 2024-08-02 LAB — MAGNESIUM: Magnesium: 2 mg/dL (ref 1.7–2.4)

## 2024-08-02 LAB — GLUCOSE, CAPILLARY
Glucose-Capillary: 186 mg/dL — ABNORMAL HIGH (ref 70–99)
Glucose-Capillary: 104 mg/dL — ABNORMAL HIGH (ref 70–99)

## 2024-08-02 LAB — BRAIN NATRIURETIC PEPTIDE: B Natriuretic Peptide: 317.8 pg/mL — ABNORMAL HIGH (ref 0.0–100.0)

## 2024-08-02 MED ORDER — ACETAMINOPHEN 650 MG RE SUPP: 650.0000 mg | Freq: Four times a day (QID) | RECTAL | Status: DC | PRN

## 2024-08-02 MED ORDER — PIRFENIDONE 801 MG PO TABS
801.0000 mg | ORAL_TABLET | Freq: Three times a day (TID) | ORAL | Status: DC
  Administered 2024-08-02 – 2024-08-10 (×24): 801 mg via ORAL
  Filled 2024-08-02 (×26): qty 1

## 2024-08-02 MED ORDER — VITAMIN D 25 MCG (1000 UNIT) PO TABS
5000.0000 [IU] | ORAL_TABLET | Freq: Every evening | ORAL | Status: DC
  Administered 2024-08-02 – 2024-08-09 (×8): 5000 [IU] via ORAL
  Filled 2024-08-02 (×8): qty 5

## 2024-08-02 MED ORDER — MAGNESIUM OXIDE -MG SUPPLEMENT 400 (240 MG) MG PO TABS
400.0000 mg | ORAL_TABLET | Freq: Every evening | ORAL | Status: DC
  Administered 2024-08-02 – 2024-08-09 (×8): 400 mg via ORAL
  Filled 2024-08-02 (×8): qty 1

## 2024-08-02 MED ORDER — METOPROLOL SUCCINATE ER 25 MG PO TB24
25.0000 mg | ORAL_TABLET | Freq: Every day | ORAL | Status: DC
  Administered 2024-08-03 – 2024-08-10 (×8): 25 mg via ORAL
  Filled 2024-08-02 (×8): qty 1

## 2024-08-02 MED ORDER — RIVAROXABAN 20 MG PO TABS
20.0000 mg | ORAL_TABLET | Freq: Every day | ORAL | Status: DC
  Administered 2024-08-02: 20 mg via ORAL
  Filled 2024-08-02: qty 1

## 2024-08-02 MED ORDER — LATANOPROST 0.005 % OP SOLN
1.0000 [drp] | Freq: Every day | OPHTHALMIC | Status: DC
  Filled 2024-08-02 (×2): qty 2.5

## 2024-08-02 MED ORDER — FUROSEMIDE 10 MG/ML IJ SOLN: 40.0000 mg | Freq: Two times a day (BID) | INTRAMUSCULAR | Status: DC

## 2024-08-02 MED ORDER — FUROSEMIDE 10 MG/ML IJ SOLN
40.0000 mg | Freq: Every day | INTRAMUSCULAR | Status: DC
  Administered 2024-08-03: 40 mg via INTRAVENOUS
  Filled 2024-08-02: qty 4

## 2024-08-02 MED ORDER — IPRATROPIUM-ALBUTEROL 0.5-2.5 (3) MG/3ML IN SOLN: 3.0000 mL | Freq: Four times a day (QID) | RESPIRATORY_TRACT | Status: DC

## 2024-08-02 MED ORDER — COVID-19 MRNA VAC-TRIS(PFIZER) 30 MCG/0.3ML IM SUSY
0.3000 mL | PREFILLED_SYRINGE | Freq: Once | INTRAMUSCULAR | Status: AC
Start: 2024-08-02 — End: 2024-08-04
  Administered 2024-08-04: 0.3 mL via INTRAMUSCULAR
  Filled 2024-08-02: qty 0.3

## 2024-08-02 MED ORDER — METOPROLOL SUCCINATE ER 25 MG PO TB24: 50.0000 mg | ORAL_TABLET | Freq: Every day | ORAL | Status: DC

## 2024-08-02 MED ORDER — INFLUENZA VAC SPLIT HIGH-DOSE 0.5 ML IM SUSY
0.5000 mL | PREFILLED_SYRINGE | INTRAMUSCULAR | Status: AC
  Administered 2024-08-03: 0.5 mL via INTRAMUSCULAR
  Filled 2024-08-02: qty 0.5

## 2024-08-02 MED ORDER — RIVAROXABAN 15 MG PO TABS
15.0000 mg | ORAL_TABLET | Freq: Every day | ORAL | Status: DC
  Administered 2024-08-03 – 2024-08-04 (×2): 15 mg via ORAL
  Filled 2024-08-02 (×2): qty 1

## 2024-08-02 MED ORDER — ATORVASTATIN CALCIUM 10 MG PO TABS
20.0000 mg | ORAL_TABLET | Freq: Every evening | ORAL | Status: DC
  Administered 2024-08-02 – 2024-08-09 (×8): 20 mg via ORAL
  Filled 2024-08-02 (×8): qty 2

## 2024-08-02 MED ORDER — SODIUM CHLORIDE 0.9% FLUSH
3.0000 mL | Freq: Two times a day (BID) | INTRAVENOUS | Status: DC
  Administered 2024-08-02 – 2024-08-10 (×11): 3 mL via INTRAVENOUS

## 2024-08-02 MED ORDER — FUROSEMIDE 10 MG/ML IJ SOLN
40.0000 mg | Freq: Once | INTRAMUSCULAR | Status: AC
  Administered 2024-08-02: 40 mg via INTRAVENOUS
  Filled 2024-08-02: qty 4

## 2024-08-02 MED ORDER — IPRATROPIUM-ALBUTEROL 0.5-2.5 (3) MG/3ML IN SOLN
3.0000 mL | Freq: Four times a day (QID) | RESPIRATORY_TRACT | Status: DC
  Administered 2024-08-02 – 2024-08-03 (×2): 3 mL via RESPIRATORY_TRACT
  Filled 2024-08-02 (×3): qty 3

## 2024-08-02 MED ORDER — ACETAMINOPHEN 325 MG PO TABS
650.0000 mg | ORAL_TABLET | Freq: Four times a day (QID) | ORAL | Status: DC | PRN
  Administered 2024-08-04: 650 mg via ORAL
  Filled 2024-08-02 (×2): qty 2

## 2024-08-02 MED ORDER — INSULIN ASPART 100 UNIT/ML IJ SOLN
0.0000 [IU] | Freq: Three times a day (TID) | INTRAMUSCULAR | Status: DC
  Administered 2024-08-02: 3 [IU] via SUBCUTANEOUS
  Administered 2024-08-02 – 2024-08-03 (×2): 2 [IU] via SUBCUTANEOUS
  Administered 2024-08-03: 3 [IU] via SUBCUTANEOUS
  Administered 2024-08-04 – 2024-08-05 (×4): 2 [IU] via SUBCUTANEOUS
  Administered 2024-08-06 (×2): 3 [IU] via SUBCUTANEOUS
  Administered 2024-08-07 – 2024-08-08 (×2): 5 [IU] via SUBCUTANEOUS
  Administered 2024-08-08 – 2024-08-10 (×4): 3 [IU] via SUBCUTANEOUS

## 2024-08-02 MED ORDER — INSULIN ASPART 100 UNIT/ML IJ SOLN
0.0000 [IU] | Freq: Every day | INTRAMUSCULAR | Status: DC
  Administered 2024-08-05 – 2024-08-09 (×2): 2 [IU] via SUBCUTANEOUS

## 2024-08-02 NOTE — ED Notes (Signed)
 CCMD called, pt on monitor

## 2024-08-02 NOTE — TOC CM/SW Note (Signed)
 Transition of Care [facility]) - Inpatient Brief Assessment   Patient Details  Name: Carrie Haynes MRN: [id number] Date of Birth: 06-18-1941  Transition of Care [facility]) CM/SW Contact:    Waddell Barnie Rama, RN Phone Number: 08/02/2024, 4:46 PM   Clinical Narrative: From home with boyfriend, has PCP and insurance on file, states has no HH services in place at this time , has rollator, home oxygen  4-5 liters could not remember name of agency.  States family member (boyfriend) will transport them home at [employer]  and will bring her portable oxygen  and family is support system, states gets medications from [facility] on [address].  Pta self ambulatory with rollator.   There are no ICM needs identified  at this time.  Please place consult for ICM  needs.     Transition of Care Asessment: Insurance and Status: Insurance coverage has been reviewed Patient has primary care physician: Yes Home environment has been reviewed: lives with friend Prior level of function:: ambulatory with rollator/rolling walker Prior/Current Home Services: Current home services (rollator, oyxgen 4 -5 liters , shower chair) Social Drivers of Health Review: SDOH reviewed no interventions necessary Readmission risk has been reviewed: Yes Transition of care needs: no transition of care needs at this time

## 2024-08-02 NOTE — Consult Note (Addendum)
 Cardiology Consultation  Patient ID: MOYINOLUWA DAWE MRN: [id number]; DOB: 06/26/1941  Admit date: 08/01/2024 Date of Consult: 08/02/2024  PCP:  Teresa Channel, MD   [facility] Providers Cardiologist:  Jerel Balding, MD     Patient Profile: Carrie Haynes is a 83 y.o. female with a hx of hypertension, hyperlipidemia, paroxysmal atrial fibrillation on Tikosyn , history of tachycardia chronic HFpEF with RV failure, type 2 diabetes, remote history of VTE, chronic hypoxic respiratory failure, interstitial lung disease on chronic 4 L oxygen , severe pulmonary hypertension, iron deficiency anemia  who is being seen 08/02/2024 for the evaluation of acute on chronic HFpEF at the request of Isaiah Lever, NP.  History of Present Illness: Carrie Haynes has past medical history as listed above. She presented to [facility] ED on 08/01/2024 for shortness of breath. When EMS arrived at her house, she was noted to have SpO2 in the 80s with RR in the 40s. She called EMS prior to going to the bathroom when she became profoundly short of breath and was not recovering.   Relevant workup while in the ED includes: BNP elevated at 317 (down compared to 466 in 04/2024), troponin negative x 2, Mag 2.0, CBC stable compared to prior, BMP showed AKI with creatinine 1.38 (was 0.75 in 04/2024), EKG showed sinus rhythm, HR 67.  Echo from 04/2024 showed: LVEF 55-60%, G1DD, moderately reduced RV function, severely enlarged V, severely elevated PASP at 92.8 mmHg, moderate MAC, severe TR, dilated IVC.   She was last seen by our team when she was admitted for a CHF exacerbation 04/2024. During that admission she required IV Lasix  that admission and was sent home with PO Lasix .   After speaking with the patient, and her family present in the room she agrees with history stated above.  She tells me that basically since she was discharged back in July she has been getting progressively more volume overloaded every day.   However she does note that this past week she had gotten significantly more short of breath and noticed a 3 to 4 pound weight gain in just a few days.  Of note when she was discharged back in July, and noted to be euvolemic her weight was 212 pounds.  There is on an accurate weight and yet today.  Past Medical History:  Diagnosis Date   A-fib (HCC)    Diabetes mellitus without complication (HCC)    Hyperlipemia    Hypertension    Past Surgical History:  Procedure Laterality Date   CARDIOVERSION N/A 05/18/2017   Procedure: CARDIOVERSION;  Surgeon: Delford Maude BROCKS, MD;  Location: [facility] ENDOSCOPY;  Service: Cardiovascular;  Laterality: N/A;   CARDIOVERSION N/A 05/23/2017   Procedure: CARDIOVERSION;  Surgeon: Shlomo Wilbert SAUNDERS, MD;  Location: [facility] ENDOSCOPY;  Service: Cardiovascular;  Laterality: N/A;   CARDIOVERSION N/A 08/28/2019   Procedure: CARDIOVERSION;  Surgeon: Jeffrie Oneil BROCKS, MD;  Location: [facility] ENDOSCOPY;  Service: Cardiovascular;  Laterality: N/A;   COLONOSCOPY N/A 08/07/2020   Procedure: COLONOSCOPY;  Surgeon: Burnette Fallow, MD;  Location: [facility] ENDOSCOPY;  Service: Endoscopy;  Laterality: N/A;   ESOPHAGOGASTRODUODENOSCOPY N/A 08/07/2020   Procedure: ESOPHAGOGASTRODUODENOSCOPY (EGD);  Surgeon: Burnette Fallow, MD;  Location: [facility] ENDOSCOPY;  Service: Endoscopy;  Laterality: N/A;   GIVENS CAPSULE STUDY N/A 08/09/2020   Procedure: GIVENS CAPSULE STUDY;  Surgeon: Burnette Fallow, MD;  Location: [facility] ENDOSCOPY;  Service: Endoscopy;  Laterality: N/A;   RIGHT HEART CATH N/A 06/23/2023   Procedure: RIGHT HEART CATH;  Surgeon: Cherrie Sieving  R, MD;  Location: [facility] INVASIVE CV LAB;  Service: Cardiovascular;  Laterality: N/A;   RIGHT/LEFT HEART CATH AND CORONARY ANGIOGRAPHY N/A 05/12/2017   Procedure: Right/Left Heart Cath and Coronary Angiography;  Surgeon: Claudene Victory ORN, MD;  Location: [facility] INVASIVE CV LAB;  Service: Cardiovascular;  Laterality: N/A;   TEE WITHOUT CARDIOVERSION N/A 05/18/2017   Procedure:  TRANSESOPHAGEAL ECHOCARDIOGRAM (TEE);  Surgeon: Delford Maude BROCKS, MD;  Location: [facility] ENDOSCOPY;  Service: Cardiovascular;  Laterality: N/A;    Home Medications:  Prior to Admission medications   Medication Sig Start Date End Date Taking? Authorizing Provider  acetaminophen  (TYLENOL ) 500 MG tablet Take 1,000 mg by mouth every 6 (six) hours as needed for moderate pain (pain score 4-6).   Yes [provider]  atorvastatin  (LIPITOR) 20 MG tablet Take 20 mg by mouth every evening. 08/13/20  Yes [provider]  Cholecalciferol  (VITAMIN D -3) 5000 units TABS Take 5,000 Units by mouth every evening.   Yes [provider]  dofetilide  (TIKOSYN ) 500 MCG capsule TAKE 1 CAPSULE BY MOUTH TWICE A DAY 11/13/23  Yes Croitoru, Mihai, MD  furosemide  (LASIX ) 40 MG tablet Take 1 tablet (40 mg total) by mouth daily. Patient taking differently: Take 40 mg by mouth every evening. 04/23/24 08/02/24 Yes [place], Eric J, DO  Magnesium  Oxide 500 MG TABS Take 500 mg by mouth every evening. 12/16/21  Yes [provider]  metFORMIN (GLUCOPHAGE-XR) 500 MG 24 hr tablet Take 500 mg by mouth at bedtime.   Yes [provider]  metoprolol  succinate (TOPROL -XL) 25 MG 24 hr tablet Take 1 tablet (25 mg total) by mouth daily. 04/23/24 08/02/24 Yes [place], Camellia PARAS, DO  Pirfenidone  (ESBRIET ) 801 MG TABS Take 1 tablet (801 mg total) by mouth 3 (three) times daily with meals. 02/06/24  Yes Mannam, Praveen, MD  rivaroxaban  (XARELTO ) 20 MG TABS tablet Take 1 tablet (20 mg total) by mouth daily with supper. 05/14/24  Yes Croitoru, Mihai, MD   Scheduled Meds:  atorvastatin   20 mg Oral QPM   cholecalciferol   5,000 Units Oral QPM   COVID-19 mRNA vaccine (Pfizer)  0.3 mL Intramuscular Once   [START ON 08/03/2024] Influenza vac split trivalent PF  0.5 mL Intramuscular Tomorrow-1000   insulin  aspart  0-15 Units Subcutaneous TID WC   insulin  aspart  0-5 Units Subcutaneous QHS   latanoprost  1 drop Both Eyes QHS    magnesium  oxide  400 mg Oral QPM   metoprolol  succinate  25 mg Oral Daily   Pirfenidone   801 mg Oral TID WC   rivaroxaban   20 mg Oral Q supper   sodium chloride  flush  3 mL Intravenous Q12H   Continuous Infusions:  PRN Meds: acetaminophen  **OR** acetaminophen   Allergies:    Allergies  Allergen Reactions   Calcium  Channel Blockers Other (See Comments)    Acute hypotension, patient became brady/asystole < 5 seconds   Estrogens Conjugated Other (See Comments)    Other reaction(s): DVT   Nystatin Hives   Social History:   Social History   Socioeconomic History   Marital status: Widowed    Spouse name: Not on file   Number of children: Not on file   Years of education: Not on file   Highest education level: Not on file  Occupational History   Not on file  Tobacco Use   Smoking status: Never   Smokeless tobacco: Never  Vaping Use   Vaping status: Never Used  Substance and Sexual Activity   Alcohol use:  No   Drug use: No   Sexual activity: Not Currently  Other Topics Concern   Not on file  Social History Narrative   Not on file   Social Drivers of Health   Financial Resource Strain: Not on file  Food Insecurity: No Food Insecurity (08/02/2024)   Hunger Vital Sign    Worried About Running Out of Food in the Last Year: Never true    Ran Out of Food in the Last Year: Never true  Transportation Needs: Patient Declined (04/17/2024)   PRAPARE - Administrator, Civil Service (Medical): Patient declined    Lack of Transportation (Non-Medical): Patient declined  Physical Activity: Not on file  Stress: Not on file  Social Connections: Patient Declined (04/17/2024)   Social Connection and Isolation Panel    Frequency of Communication with Friends and Family: Patient declined    Frequency of Social Gatherings with Friends and Family: Patient declined    Attends Religious Services: Patient declined    Database administrator or Organizations: Patient declined     Attends Banker Meetings: Patient declined    Marital Status: Patient declined  Intimate Partner Violence: Patient Declined (04/17/2024)   Humiliation, Afraid, Rape, and Kick questionnaire    Fear of Current or Ex-Partner: Patient declined    Emotionally Abused: Patient declined    Physically Abused: Patient declined    Sexually Abused: Patient declined    Family History:   Family History  Problem Relation Age of Onset   Hypertension Father    Heart attack Sister    Breast cancer Other     ROS:  Please see the history of present illness.  All other ROS reviewed and negative.     Physical Exam/Data: Vitals:   08/02/24 1045 08/02/24 1100 08/02/24 1115 08/02/24 1200  BP: 106/65 107/70 107/68 112/76  Pulse:      Resp: 20 (!) 23 19 (!) 26  Temp:      TempSrc:      SpO2: 93% 94% 91% (!) 89%  Weight:        Intake/Output Summary (Last 24 hours) at 08/02/2024 1216 Last data filed at 08/02/2024 0500 Gross per 24 hour  Intake --  Output 500 ml  Net -500 ml      08/02/2024   12:07 AM 06/13/2024   11:21 AM 05/14/2024    8:23 AM  Last 3 Weights  Weight (lbs) 195 lb 1.7 oz 195 lb 196 lb  Weight (kg) 88.5 kg 88.451 kg 88.905 kg     Body mass index is 38.1 kg/m.   General:  in no acute distress, on baseline 4 L oxygen , family present in room HEENT: normal Neck: Unable to assess JVD Vascular: No carotid bruits; Distal pulses 2+ bilaterally Cardiac:  normal S1, S2; RRR; no murmur  Lungs: Diminished breath sounds with crackles at bases, conversationally dyspneic at times Abd: soft, nontender Ext: no edema Musculoskeletal:  No deformities, BUE and BLE strength normal and equal Skin: warm and dry  Neuro:  no focal abnormalities noted Psych:  Normal affect   EKG:  The EKG was personally reviewed and demonstrates: Sinus rhythm, occasional ectopy  Relevant CV Studies:  Echocardiogram, 04/17/2024 Left ventricular ejection fraction, by estimation, is 55 to 60% . The  left ventricle has normal function. The left ventricle has no regional wall motion abnormalities. Left ventricular diastolic parameters are consistent with Grade I diastolic dysfunction ( impaired relaxation) D- shaped ventricular septum suggestive of RV  pressure/ volume overload. Right ventricular systolic function is moderately reduced. The right ventricular size is severely enlarged. There is severely elevated pulmonary artery systolic pressure. The estimated right ventricular systolic pressure is 92. 8 mmHg.  Right atrial size was moderately dilated.  The mitral valve is normal in structure. Trivial mitral valve regurgitation. No evidence of mitral stenosis. Moderate mitral annular calcification.  The tricuspid valve is abnormal. Tricuspid valve regurgitation is severe. Systolic flow reversal in the hepatic vein doppler pattern.  The aortic valve is tricuspid. There is moderate calcification of the aortic valve. Aortic valve regurgitation is not visualized. Aortic valve sclerosis/ calcification is present, without any evidence of aortic stenosis.  The inferior vena cava is dilated in size with < 50% respiratory variability, suggesting right atrial pressure of 15 mmHg.  Right heart cath, 06/22/2024 Findings:   RA = 4 RV = 71/10 PA = 68/21 (42) PCW = 10 Fick cardiac output/index = 4.2/2.2 PVR = 7.5 WU FA sat = 98% PA sat = 59% PAPi = 11.8   Assessment: 1. Moderate PAH with normal left sided pressures. 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 Moderately reduced CO with normal PAPi   Plan/Discussion:    Will defer to ILD clinic for potential trial of inhaled treprostinil  versus low-dose oral PDE-5i.    Reinforced importance of using O2 and weight loss.   Right/left heart cath, 05/12/2017 Right dominant coronary anatomy. Normal coronary arteries. Estimated left ventricular ejection fraction 35-40%, global hypokinesis, with elevated filling pressures consistent with acute  on chronic combined systolic and diastolic heart failure.     RECOMMENDATIONS: Consideration of tachycardia related left ventricular systolic dysfunction should be given. Rate control and possible rhythm control of atrial fibrillation per primary team.  Laboratory Data: High Sensitivity Troponin:   Recent Labs  Lab 08/02/24 0200 08/02/24 0429  TROPONINIHS 12 12     Chemistry Recent Labs  Lab 08/02/24 0016 08/02/24 0429  NA 134*  --   K 4.1  --   CL 96*  --   CO2 23  --   GLUCOSE 130*  --   BUN 33*  --   CREATININE 1.38*  --   CALCIUM  8.7*  --   MG  --  2.0  GFRNONAA 39*  --   ANIONGAP 15  --     No results for input(s): PROT, ALBUMIN, AST, ALT, ALKPHOS, BILITOT in the last 168 hours. Lipids No results for input(s): CHOL, TRIG, HDL, LABVLDL, LDLCALC, CHOLHDL in the last 168 hours.  Hematology Recent Labs  Lab 08/02/24 0016  WBC 6.5  RBC 3.51*  HGB 10.7*  HCT 33.2*  MCV 94.6  MCH 30.5  MCHC 32.2  RDW 15.9*  PLT 212   Thyroid  No results for input(s): TSH, FREET4 in the last 168 hours.  BNP Recent Labs  Lab 08/02/24 0200  BNP 317.8*    DDimer No results for input(s): DDIMER in the last 168 hours.  Radiology/Studies:  DG Chest Portable 1 View Result Date: 08/02/2024 EXAM: 1 VIEW(S) XRAY OF THE CHEST 08/02/2024 12:33:06 AM COMPARISON: 04/21/2024 CLINICAL HISTORY: sob. home via [facility] with sob, wears 4LNC, hx of pulmonary fibrosis. Pt reports bilateral leg swelling. FINDINGS: LUNGS AND PLEURA: Chronic coarsened interstitial markings, likely reflecting chronic interstitial lung disease. No focal pulmonary opacity. No pulmonary edema. No pleural effusion. No pneumothorax. HEART AND MEDIASTINUM: Cardiomegaly. Atherosclerotic calcifications. BONES AND SOFT TISSUES: No acute osseous abnormality. IMPRESSION: 1. Cardiomegaly. 2. Stable chronic interstitial  lung disease. Electronically signed by: Pinkie Pebbles MD 08/02/2024 12:45 AM EDT RP  Workstation: [id number]   Assessment and Plan:  Acute on chronic HFpEF/RV failure Pulmonary hypertension, severe Home meds: Toprol  25 mg daily, PO Lasix  40 mg daily Presented with progressive DOE, LE edema, weight gain BNP 317 today CXR showed stable cardiomegaly, ILD Given IV Lasix  40 mg x 1 dose so far  Creatine up from baseline 1.38 today, previously 0.75 Wears 4 L oxygen  chronically at home, currently on 4 L  Reports improvement in symptoms since admission  Dry weight in July when noted to be euvolemic 212 pounds Reports good urine output, resolution of some symptoms with single dose of IV Lasix  Issues with hypotension in the past especially with diuresis Consider starting IV Lasix  40 mg daily, would like to be more gentle in terms of diuresis as she easily becomes hypotensive May require either increase of daily Lasix  dose at discharge or further instructions on how to take a as needed dose for weight gain/symptoms Holding home Toprol  for now, due to hypotension Continue strict I&O's, daily BMPs, daily weights   Paroxysmal atrial fibrillation   Known history of PAF Home meds: Toprol  25 mg daily, Tikosyn  500 mcg BID, Xarelto  20 mg daily Currently in sinus with HR 70s-80s She reports she has only been taking Tikosyn  once daily for months because she typically sleeps in until the afternoon.  Unfortunately with only taking Tikosyn  once daily, she would have to be reloaded on Tikosyn  to continue.  Discussed with her cardiologist, Dr. Francyne and would favor staying on Tikosyn  if possible.  Reports her A-fib was previously very difficult to rate control and she developed tachycardia induced cardiomyopathy and amiodarone  not a good option given her interstitial lung disease.  Since being on Tikosyn  she has been maintaining sinus rhythm.  Will consult EP to consider reloading with Tikosyn  Continue Xarelto  20 mg daily   ADDENDUM: d/w EP, they will plan to see on Monday for Tikosyn  reload  once acute issues have improved   Hyperlipidemia 04/16/2024: ALT 20 04/19/2024: HDL 36; LDL Cholesterol 47  Continue Lipitor 20 mg daily    Per primary Electrolyte disturbances Chronic hypoxic respiratory failure, ILD Idiopathic pulmonary fibrosis  On chronic oxygen   Type 2 diabetes Pulmonary HTN 2/2 lung disease  Weakness, deconditioning    Risk Assessment/Risk Scores:      New York  Heart Association (NYHA) Functional Class NYHA Class III  CHA2DS2-VASc Score = 6   This indicates a 9.7% annual risk of stroke. The patient's score is based upon: CHF History: 1 HTN History: 1 Diabetes History: 1 Stroke History: 0 Vascular Disease History: 0 Age Score: 2 Gender Score: 1     For questions or updates, please contact [facility] Please consult [email or web address] for contact info under     Signed, Waddell DELENA Donath, PA-C  08/02/2024 12:16 PM  Patient seen and examined.  Agree with above documentation.  Carrie Haynes is an 83 year old female with a history of chronic diastolic heart failure, RV failure, T2DM, chronic hypoxic respiratory failure, interstitial lung disease, severe pulmonary hypertension, paroxysmal atrial fibrillation.  Consulted for evaluation of acute on chronic diastolic heart failure at the request of Isaiah Lever, NP.  She was admitted for heart failure exacerbation 04/2024, diuresed with IV Lasix .  Echocardiogram during that admission showed EF 55 to 60%, moderately reduced RV function with severe enlargement, severely elevated pulmonary pressures measuring 93 mmHg, severe TR.  She presented overnight with shortness of breath.  Called EMS and SpO2 initially in 80s with respiratory rate in 40s.  On arrival to ED, initial vital signs notable for BP 112/66, SpO2 97% on 4 L, pulse 60.  Labs notable for creatinine 1.38 (increased from 0.75 on 04/22/2024), sodium 134, BNP 318, troponin 12 > 12, hemoglobin 10.7, platelets 212, WBC 6.5.  EKG shows sinus rhythm with PACs,  low voltage, poor R wave progression; initial EKG showed QTC 608, on repeat was 490.  Chest x-ray shows cardiomegaly and stable chronic interstitial lung disease.On exam, patient is alert and oriented, irregular rhythm, normal rate, no murmurs, crackles on lung exam, no LE edema, +JVD.  For her acute on chronic diastolic heart failure/RV failure, continue IV Lasix .  She reports good response to Lasix  this morning.  Appears mildly hypervolemic on exam but does not appear significantly volume overloaded.  Her ILD may be driving a lot of her respiratory symptoms.  Will monitor renal function with diuresis.  For her atrial fibrillation, she is currently in sinus rhythm.  QT initially prolonged on EKG but appeared improved on repeat EKG (490).  She reports she has only been taking Tikosyn  once daily for months because she typically sleeps in until the afternoon.  Unfortunately with only taking Tikosyn  once daily, she would have to be reloaded on Tikosyn  to continue.  Discussed with her cardiologist, Dr. Francyne and would favor staying on Tikosyn  if possible.  Reports her A-fib was previously very difficult to rate control and she developed tachycardia induced cardiomyopathy and amiodarone  not a good option given her interstitial lung disease.  Since being on Tikosyn  she has been maintaining sinus rhythm.  Will consult EP to consider reloading with Tikosyn   Carrie LITTIE Nanas, MD

## 2024-08-02 NOTE — Progress Notes (Signed)
 MEWS Progress Note  Patient Details Name: Carrie Haynes MRN: [id number] DOB: 11-Sep-1941 Today's Date: 08/02/2024   MEWS Flowsheet Documentation:  Assess: MEWS Score Temp: 97.9 F (36.6 C) BP: 112/73 MAP (mmHg): 85 Pulse Rate: 73 ECG Heart Rate: 80 Resp: (!) 28 Level of Consciousness: Alert SpO2: 97 % O2 Device: High Flow Nasal Cannula Patient Activity (if Appropriate): In bed O2 Flow Rate (L/min): 7 L/min Assess: MEWS Score MEWS Temp: 0 MEWS Systolic: 0 MEWS Pulse: 0 MEWS RR: 2 MEWS LOC: 0 MEWS Score: 2 MEWS Score Color: Yellow Assess: SIRS CRITERIA SIRS Temperature : 0 SIRS Respirations : 1 SIRS Pulse: 0 SIRS WBC: 0 SIRS Score Sum : 1 SIRS Temperature : 0 SIRS Pulse: 0 SIRS Respirations : 1 SIRS WBC: 0 SIRS Score Sum : 1 Assess: if the MEWS score is Yellow or Red Were vital signs accurate and taken at a resting state?: No, vital signs rechecked Does the patient meet 2 or more of the SIRS criteria?: No    Patient's O2 was satting in 78-82 on 4 L (baseline), and turned [place] up to 6L/min. O2 Sat went up to 91 on 6L, and we still implemented the HFNC, at 9L, and have now weaned down to 6L/min, to wean back to [place].  Part of the event may have been from mild exertion turning in the beds, and holding onto handrails.   [name] LOISE Server 08/02/2024, 6:35 PM

## 2024-08-02 NOTE — ED Provider Notes (Addendum)
 [place] [facility]ERGENC[facility] DEPARTMENT AT [facility] Provider Note   CSN: [id number] Arrival date & time: 08/01/24  2358     Patient presents with: Shortness of Breath   Carrie Haynes is a 83 y.o. female.   Patient presents to the emergency department for evaluation of shortness of breath.  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  She is normally on 4 L by nasal cannula continually.  Patient comes to the emergency department from home by ambulance.       Prior to Admission medications   Medication Sig Start Date End Date Taking? Authorizing Provider  atorvastatin  (LIPITOR) 20 MG tablet Take 20 mg by mouth every evening. 08/13/20   [provider]  Cholecalciferol  (VITAMIN D -3) 5000 units TABS Take 5,000 Units by mouth every evening.    [provider]  dofetilide  (TIKOSYN ) 500 MCG capsule TAKE 1 CAPSULE BY MOUTH TWICE A DAY 11/13/23   Croitoru, Mihai, MD  furosemide  (LASIX ) 40 MG tablet Take 1 tablet (40 mg total) by mouth daily. 04/23/24 07/22/24  [place], Eric J, DO  LUMIGAN 0.01 % SOLN Place 1 drop into both eyes at bedtime. 05/12/23   [provider]  Magnesium  Oxide 500 MG TABS Take 500 mg by mouth every evening. 12/16/21   [provider]  metFORMIN (GLUCOPHAGE-XR) 500 MG 24 hr tablet Take 500 mg by mouth at bedtime.    [provider]  metoprolol  succinate (TOPROL -XL) 25 MG 24 hr tablet Take 1 tablet (25 mg total) by mouth daily. 04/23/24 07/22/24  [place], Camellia PARAS, DO  Pirfenidone  (ESBRIET ) 801 MG TABS Take 1 tablet (801 mg total) by mouth 3 (three) times daily with meals. 02/06/24   Mannam, Praveen, MD  rivaroxaban  (XARELTO ) 20 MG TABS tablet Take 1 tablet (20 mg total) by mouth daily with supper. 05/14/24   Croitoru, Mihai, MD    Allergies: Calcium  channel blockers, Estrogens conjugated, and Nystatin    Review of Systems  Updated Vital Signs BP 123/83   Pulse 62   Temp 98 F (36.7 C)   Resp (!) 25   Wt  88.5 kg   SpO2 100%   BMI 38.10 kg/m   Physical Exam  (all labs ordered are listed, but only abnormal results are displayed) Labs Reviewed  BASIC METABOLIC PANEL WITH GFR - Abnormal; Notable for the following components:      Result Value   Sodium 134 (*)    Chloride 96 (*)    Glucose, Bld 130 (*)    BUN 33 (*)    Creatinine, Ser 1.38 (*)    Calcium  8.7 (*)    GFR, Estimated 39 (*)    All other components within normal limits  CBC - Abnormal; Notable for the following components:   RBC 3.51 (*)    Hemoglobin 10.7 (*)    HCT 33.2 (*)    RDW 15.9 (*)    All other components within normal limits  BRAIN NATRIURETIC PEPTIDE - Abnormal; Notable for the following components:   B Natriuretic Peptide 317.8 (*)    All other components within normal limits  MAGNESIUM   TROPONIN I (HIGH SENSITIVITY)  TROPONIN I (HIGH SENSITIVITY)    EKG: EKG Interpretation Date/Time:  Friday August 02 2024 00:12:54 EDT Ventricular Rate:  67 PR Interval:  182 QRS Duration:  86 QT Interval:  576 QTC Calculation: 608 R Axis:   249  Text Interpretation: Sinus rhythm with Premature atrial complexes with Abberant conduction Right superior axis  deviation Low voltage QRS Cannot rule out Anterior infarct , age undetermined Prolonged QT Abnormal ECG When compared with ECG of 22-Apr-2024 05:33, PREVIOUS ECG IS PRESENT Confirmed by Haze Lonni PARAS [phone]) on 08/02/2024 3:25:56 AM  Radiology: DG Chest Portable 1 View Result Date: 08/02/2024 EXAM: 1 VIEW(S) XRAY OF THE CHEST 08/02/2024 12:33:06 AM COMPARISON: 04/21/2024 CLINICAL HISTORY: sob. home via [facility] with sob, wears 4LNC, hx of pulmonary fibrosis. Pt reports bilateral leg swelling. FINDINGS: LUNGS AND PLEURA: Chronic coarsened interstitial markings, likely reflecting chronic interstitial lung disease. No focal pulmonary opacity. No pulmonary edema. No pleural effusion. No pneumothorax. HEART AND MEDIASTINUM: Cardiomegaly. Atherosclerotic  calcifications. BONES AND SOFT TISSUES: No acute osseous abnormality. IMPRESSION: 1. Cardiomegaly. 2. Stable chronic interstitial lung disease. Electronically signed by: Pinkie Pebbles MD 08/02/2024 12:45 AM EDT RP Workstation: [id number]     Procedures   Medications Ordered in the ED  furosemide  (LASIX ) injection 40 mg (40 mg Intravenous Given 08/02/24 0325)                                    Medical Decision Making Amount and/or Complexity of Data Reviewed External Data Reviewed: labs, radiology, ECG and notes. Labs: ordered. Decision-making details documented in ED Course. Radiology: ordered and independent interpretation performed. Decision-making details documented in ED Course. ECG/medicine tests: ordered and independent interpretation performed. Decision-making details documented in ED Course.  Risk Prescription drug management. Decision regarding hospitalization.   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  Patient reports a 3-day history of progressively worsening dyspnea on exertion.  No cough or fever.  Patient reports that her legs are progressively swelling over this period of time.  Patient indicates recent hospitalization for congestive heart failure exacerbation with similar presentation.  She has gained 11 pounds, consistent with volume overload.  Will initiate diuresis, admit patient.  QTc significantly prolonged.  Potassium normal, will check magnesium .     Final diagnoses:  Acute on chronic diastolic congestive heart failure [facility])    ED Discharge Orders     None          Haze Lonni PARAS, MD 08/02/24 [id number]    Haze Lonni PARAS, MD 08/02/24 [phone]

## 2024-08-02 NOTE — H&P (Addendum)
 History and Physical    Patient: Carrie Haynes FMW:[id number] DOB: [birth year] DOA: 08/01/2024 DOS: the patient was seen and examined on 08/02/2024 PCP: Teresa Channel, MD  Patient coming from: Home  Chief Complaint:  Chief Complaint  Patient presents with   Shortness of Breath   HPI: Carrie Haynes is a 83 y.o. 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  In discussing with the patient she is on 4 L of oxygen  at baseline and due to severe chronic dyspnea on exertion she intermittently increases her oxygen  with activity.  She was recently diagnosed with heart failure back in July during a hospitalization for respiratory symptoms.  Last echocardiogram was in July 2025.  She was discharged home on Lasix  and was instructed to weigh daily and monitor it for rapid weight gain over 24 hours.  Over the past 3 days she has noticed progressive lower extremity edema and shortness of breath worse than baseline.  At presentation to the ED she was noted to have tight bilateral lower extremity edema and an elevated BNP of 317.  When EMS presented to her home she had O2 sats down in the 80s on her usual oxygen  and her respiratory rate was in the 40s.  The patient clarified that prior to calling EMS she had mobilized to the bathroom and became profoundly dyspneic but was unable to recover.  Additional lab work revealed mild AKI with a BUN of 33 and a creatinine of 1.38 noting her baseline creatinine is 0.75 with a BUN of 16.  She denies taking any extra Lasix , no changes in medications otherwise, no dietary indiscretion although she did eat a few pickles recently but not excessive amounts.  She reports 4 pounds of weight gain in the past 24 hours.  She denied any constitutional  symptoms such as fevers or chills, no cough.  Chest x-ray revealed cardiomegaly with stable chronic interstitial lung disease her QTc is 603 ms.  Patient is on Tikosyn  for underlying A-fib.  Hospital service has been asked to evaluate the patient for admission.  In addition to the above over the past several years patient's functional status and quality of life have progressively worsened.  She is no longer able to leave the house to do basic things such as shop for groceries or go out to eat.  In addition she is no longer able to attend physician visits in person and accomplishes visits by FaceTime.  She states she she would like to get a flu shot and a COVID shot but was unable to travel to the physician's office to obtain this.  She is very concerned over significant shortness of breath even while moving on the stretcher and would like to keep her PureWick device in place.  She states on a good day she can walk about 5 feet before becoming profoundly dyspneic.  I discussed CODE STATUS with her and her daughter.  Primary concerns are if patient is intubated that she would not be able to be liberated from the ventilator and likely would require tracheostomy tube placement and possible chronic ventilator.  Patient and daughter verbalized understanding but patient does want everything done.  Daughter is aware that if patient is unable to be liberated from the ventilator she may be required to make difficult decisions regarding removing her mother from the ventilator  and declining tracheostomy tube placement.   Review of Systems: As mentioned in the history of present illness. All other systems reviewed and are negative.   Past Medical History:  Diagnosis Date   A-fib (HCC)    Diabetes mellitus without complication (HCC)    Hyperlipemia    Hypertension    Past Surgical History:  Procedure Laterality Date   CARDIOVERSION N/A 05/18/2017   Procedure: CARDIOVERSION;  Surgeon: Delford Maude BROCKS, MD;   Location: [facility] ENDOSCOPY;  Service: Cardiovascular;  Laterality: N/A;   CARDIOVERSION N/A 05/23/2017   Procedure: CARDIOVERSION;  Surgeon: Shlomo Wilbert SAUNDERS, MD;  Location: [facility] ENDOSCOPY;  Service: Cardiovascular;  Laterality: N/A;   CARDIOVERSION N/A 08/28/2019   Procedure: CARDIOVERSION;  Surgeon: Jeffrie Oneil BROCKS, MD;  Location: [facility] ENDOSCOPY;  Service: Cardiovascular;  Laterality: N/A;   COLONOSCOPY N/A 08/07/2020   Procedure: COLONOSCOPY;  Surgeon: Burnette Fallow, MD;  Location: [facility] ENDOSCOPY;  Service: Endoscopy;  Laterality: N/A;   ESOPHAGOGASTRODUODENOSCOPY N/A 08/07/2020   Procedure: ESOPHAGOGASTRODUODENOSCOPY (EGD);  Surgeon: Burnette Fallow, MD;  Location: [facility] ENDOSCOPY;  Service: Endoscopy;  Laterality: N/A;   GIVENS CAPSULE STUDY N/A 08/09/2020   Procedure: GIVENS CAPSULE STUDY;  Surgeon: Burnette Fallow, MD;  Location: [facility] ENDOSCOPY;  Service: Endoscopy;  Laterality: N/A;   RIGHT HEART CATH N/A 06/23/2023   Procedure: RIGHT HEART CATH;  Surgeon: Cherrie Toribio SAUNDERS, MD;  Location: [facility] INVASIVE CV LAB;  Service: Cardiovascular;  Laterality: N/A;   RIGHT/LEFT HEART CATH AND CORONARY ANGIOGRAPHY N/A 05/12/2017   Procedure: Right/Left Heart Cath and Coronary Angiography;  Surgeon: Claudene Victory ORN, MD;  Location: [facility] INVASIVE CV LAB;  Service: Cardiovascular;  Laterality: N/A;   TEE WITHOUT CARDIOVERSION N/A 05/18/2017   Procedure: TRANSESOPHAGEAL ECHOCARDIOGRAM (TEE);  Surgeon: Delford Maude BROCKS, MD;  Location: [facility] ENDOSCOPY;  Service: Cardiovascular;  Laterality: N/A;   Social History:  reports that she has never smoked. She has never used smokeless tobacco. She reports that she does not drink alcohol and does not use drugs.  Allergies  Allergen Reactions   Calcium  Channel Blockers Other (See Comments)    Acute hypotension, patient became brady/asystole < 5 seconds   Estrogens Conjugated Other (See Comments)    Other reaction(s): DVT   Nystatin Hives    Family History  Problem Relation Age of Onset    Hypertension Father    Heart attack Sister    Breast cancer Other     Prior to Admission medications   Medication Sig Start Date End Date Taking? Authorizing Provider  atorvastatin  (LIPITOR) 20 MG tablet Take 20 mg by mouth every evening. 08/13/20   [provider]  Cholecalciferol  (VITAMIN D -3) 5000 units TABS Take 5,000 Units by mouth every evening.    [provider]  dofetilide  (TIKOSYN ) 500 MCG capsule TAKE 1 CAPSULE BY MOUTH TWICE A DAY 11/13/23   Croitoru, Mihai, MD  furosemide  (LASIX ) 40 MG tablet Take 1 tablet (40 mg total) by mouth daily. 04/23/24 07/22/24  [place], Eric J, DO  LUMIGAN 0.01 % SOLN Place 1 drop into both eyes at bedtime. 05/12/23   [provider]  Magnesium  Oxide 500 MG TABS Take 500 mg by mouth every evening. 12/16/21   [provider]  metFORMIN (GLUCOPHAGE-XR) 500 MG 24 hr tablet Take 500 mg by mouth at bedtime.    [provider]  metoprolol  succinate (TOPROL -XL) 25 MG 24 hr tablet Take 1 tablet (25 mg total) by mouth daily. 04/23/24 07/22/24  [place], Eric J, DO  metoprolol  succinate (TOPROL -XL)  50 MG 24 hr tablet Take 50 mg by mouth daily.    [provider]  Pirfenidone  (ESBRIET ) 801 MG TABS Take 1 tablet (801 mg total) by mouth 3 (three) times daily with meals. 02/06/24   Mannam, Praveen, MD  rivaroxaban  (XARELTO ) 20 MG TABS tablet Take 1 tablet (20 mg total) by mouth daily with supper. 05/14/24   Croitoru, Jerel, MD    Physical Exam: Vitals:   08/02/24 0945 08/02/24 1000 08/02/24 1015 08/02/24 1022  BP: (!) 105/49 (!) 92/52 (!) 78/46 98/66  Pulse:      Resp: (!) 22 (!) 26 (!) 21 (!) 31  Temp:  98.2 F (36.8 C)    TempSrc:  Oral    SpO2: 92% 92% 93% 91%  Weight:       Constitutional: NAD, calm, comfortable at rest Respiratory: Diffuse coarse lung sounds on posterior exam with what sounds like chronic rales/crackles.  Normal respiratory effort. No accessory muscle use.  Stable at rest but even with minimal  activity on the stretcher she becomes dyspneic.  She is able to talk without becoming dyspneic.  She is tolerating 4 L nasal cannula oxygen  at baseline Cardiovascular: Regular rate and maintaining sinus rhythm, no murmurs / rubs / gallops. No extremity edema. 2+ pedal pulses. .  Abdomen: no tenderness, no masses palpated. No hepatosplenomegaly. Bowel sounds positive.  Musculoskeletal: no clubbing / cyanosis. No joint deformity upper and lower extremities. Good ROM, no contractures. Normal muscle tone.  Skin: no rashes, lesions, ulcers. No induration Neurologic: CN 2-12 grossly intact. Sensation intact, . Strength 3-4/5 x all 4 extremities.  Psychiatric: Normal judgment and insight. Alert and oriented x 3. Normal mood.     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  We will repeat 1 more dose and follow-up labs in the morning given presentation with AKI Daily weights with strict intake and output; due to profound dyspnea with activity including attempts to sit on bedpan while on stretcher plan is to utilize PureWick for now Continue preadmission beta-blocker Unclear how much her IPF is contributing-I have discussed with my attending physician and we may consider consulting pulmonary medicine for additional recommendations Continue Esbriet  for pulmonary hypertension When patient last spoke with Dr. Theophilus he  recommended adding in a nebulizer treatment.  Patient felt like the nebulizer would be too much work regarding having to take 4 times per day and having to clean the equipment after each use  Chronic atrial fibrillation maintaining sinus rhythm Continue Xarelto  for stroke prophylaxis QTc prolonged at 603 ms-repeat EKG and hold Tikosyn  for now and consult cardiology for recommendations Patient reports that at home she typically sleeps and so does not take an a.m. dose of the Tikosyn  and is actually only taking 500 mg with supper Potassium and magnesium  currently normal Follow labs closely Continue preadmission magnesium   Acute kidney injury Baseline creatinine 0.75 with current creatinine 1.38 Etiology unclear Avoid nephrotoxic medication-will hold preadmission metformin  Diabetes  mellitus 2 Follow CBGs and provide SSI Holding metformin secondary to AKI Hemoglobin A1c in July was 7.3 which indicates inadequately controlled glucose  Hypertension Current blood pressure readings are actually somewhat low Continue to follow vital signs while receiving diuretic  HLD Continue Lipitor  Healthcare maintenance Per patient request I have ordered influenza and COVID-vaccine    Advance Care Planning:   Code Status: Full Code discussed at length with patient and daughter.  See HPI  VTE prophylaxis: Xarelto   Consults: Cardiology   Family Communication: Daughter at bedside  Severity of Illness: The appropriate patient status for this patient is OBSERVATION. Observation status is judged to be reasonable and necessary in order to provide the required intensity of service to ensure the patient's safety. The patient's presenting symptoms, physical exam findings, and initial radiographic and laboratory data in the context of their medical condition is felt to place them at decreased risk for further clinical deterioration. Furthermore, it is anticipated that the patient will be medically stable  for discharge from the hospital within 2 midnights of admission.   Author: Isaiah Lever, NP 08/02/2024 10:35 AM  For on call review [email or web address].

## 2024-08-02 NOTE — Progress Notes (Signed)
 Not hospital placed, should get IV replacement within 48 hrs.

## 2024-08-02 NOTE — Care Management Obs Status (Signed)
 MEDICARE OBSERVATION STATUS NOTIFICATION   Patient Details  Name: ODETH BRY MRN: [id number] Date of Birth: 1941/09/12   Medicare Observation Status Notification Given:       Waddell Barnie Rama, RN 08/02/2024, 4:53 PM

## 2024-08-02 NOTE — Progress Notes (Signed)
 Heart Failure Navigator Progress Note  Assessed for Heart & Vascular TOC clinic readiness.  Patient does not meet criteria due to last EF 55-60%, Per NP note . over the past several years patient's functional status and quality of life have progressively worsened. She is no longer able to leave the house to do basic things such as shop for groceries or go out to eat. In addition she is no longer able to attend physician visits in person and accomplishes visits by FaceTime. No HF TOC.  Navigator will sign off at this time.   Stephane Haddock, BSN, Scientist, clinical (histocompatibility and immunogenetics) Only

## 2024-08-03 DIAGNOSIS — E1169 Type 2 diabetes mellitus with other specified complication: Secondary | ICD-10-CM | POA: Diagnosis present

## 2024-08-03 DIAGNOSIS — Z79899 Other long term (current) drug therapy: Secondary | ICD-10-CM | POA: Diagnosis not present

## 2024-08-03 DIAGNOSIS — N179 Acute kidney failure, unspecified: Secondary | ICD-10-CM | POA: Diagnosis present

## 2024-08-03 DIAGNOSIS — I10 Essential (primary) hypertension: Secondary | ICD-10-CM | POA: Diagnosis not present

## 2024-08-03 DIAGNOSIS — I480 Paroxysmal atrial fibrillation: Secondary | ICD-10-CM | POA: Diagnosis not present

## 2024-08-03 DIAGNOSIS — I130 Hypertensive heart and chronic kidney disease with heart failure and stage 1 through stage 4 chronic kidney disease, or unspecified chronic kidney disease: Secondary | ICD-10-CM | POA: Diagnosis present

## 2024-08-03 DIAGNOSIS — Z7901 Long term (current) use of anticoagulants: Secondary | ICD-10-CM | POA: Diagnosis not present

## 2024-08-03 DIAGNOSIS — E7849 Other hyperlipidemia: Secondary | ICD-10-CM | POA: Diagnosis present

## 2024-08-03 DIAGNOSIS — E1122 Type 2 diabetes mellitus with diabetic chronic kidney disease: Secondary | ICD-10-CM | POA: Diagnosis present

## 2024-08-03 DIAGNOSIS — I071 Rheumatic tricuspid insufficiency: Secondary | ICD-10-CM | POA: Diagnosis present

## 2024-08-03 DIAGNOSIS — I420 Dilated cardiomyopathy: Secondary | ICD-10-CM | POA: Diagnosis present

## 2024-08-03 DIAGNOSIS — E871 Hypo-osmolality and hyponatremia: Secondary | ICD-10-CM | POA: Diagnosis present

## 2024-08-03 DIAGNOSIS — J84112 Idiopathic pulmonary fibrosis: Secondary | ICD-10-CM | POA: Diagnosis present

## 2024-08-03 DIAGNOSIS — Z9981 Dependence on supplemental oxygen: Secondary | ICD-10-CM | POA: Diagnosis not present

## 2024-08-03 DIAGNOSIS — I4891 Unspecified atrial fibrillation: Secondary | ICD-10-CM | POA: Diagnosis not present

## 2024-08-03 DIAGNOSIS — Z7984 Long term (current) use of oral hypoglycemic drugs: Secondary | ICD-10-CM | POA: Diagnosis not present

## 2024-08-03 DIAGNOSIS — Z23 Encounter for immunization: Secondary | ICD-10-CM | POA: Diagnosis present

## 2024-08-03 DIAGNOSIS — I4819 Other persistent atrial fibrillation: Secondary | ICD-10-CM | POA: Diagnosis present

## 2024-08-03 DIAGNOSIS — I5033 Acute on chronic diastolic (congestive) heart failure: Secondary | ICD-10-CM | POA: Diagnosis present

## 2024-08-03 DIAGNOSIS — Z6839 Body mass index (BMI) 39.0-39.9, adult: Secondary | ICD-10-CM | POA: Diagnosis not present

## 2024-08-03 DIAGNOSIS — N1831 Chronic kidney disease, stage 3a: Secondary | ICD-10-CM | POA: Diagnosis present

## 2024-08-03 DIAGNOSIS — J9621 Acute and chronic respiratory failure with hypoxia: Secondary | ICD-10-CM | POA: Diagnosis present

## 2024-08-03 DIAGNOSIS — I272 Pulmonary hypertension, unspecified: Secondary | ICD-10-CM | POA: Diagnosis present

## 2024-08-03 DIAGNOSIS — E66812 Obesity, class 2: Secondary | ICD-10-CM | POA: Diagnosis present

## 2024-08-03 DIAGNOSIS — D6869 Other thrombophilia: Secondary | ICD-10-CM | POA: Diagnosis present

## 2024-08-03 DIAGNOSIS — Z8249 Family history of ischemic heart disease and other diseases of the circulatory system: Secondary | ICD-10-CM | POA: Diagnosis not present

## 2024-08-03 DIAGNOSIS — J449 Chronic obstructive pulmonary disease, unspecified: Secondary | ICD-10-CM | POA: Diagnosis present

## 2024-08-03 LAB — MAGNESIUM: Magnesium: 1.9 mg/dL (ref 1.7–2.4)

## 2024-08-03 LAB — GLUCOSE, CAPILLARY
Glucose-Capillary: 150 mg/dL — ABNORMAL HIGH (ref 70–99)
Glucose-Capillary: 161 mg/dL — ABNORMAL HIGH (ref 70–99)
Glucose-Capillary: 118 mg/dL — ABNORMAL HIGH (ref 70–99)
Glucose-Capillary: 154 mg/dL — ABNORMAL HIGH (ref 70–99)

## 2024-08-03 LAB — BASIC METABOLIC PANEL WITH GFR
Anion gap: 11 (ref 5–15)
BUN: 26 mg/dL — ABNORMAL HIGH (ref 8–23)
CO2: 25 mmol/L (ref 22–32)
Calcium: 8.5 mg/dL — ABNORMAL LOW (ref 8.9–10.3)
Chloride: 99 mmol/L (ref 98–111)
Creatinine, Ser: 1.13 mg/dL — ABNORMAL HIGH (ref 0.44–1.00)
GFR, Estimated: 49 mL/min — ABNORMAL LOW (ref >60)
Glucose, Bld: 103 mg/dL — ABNORMAL HIGH (ref 70–99)
Potassium: 4.2 mmol/L (ref 3.5–5.1)
Sodium: 135 mmol/L (ref 135–145)

## 2024-08-03 MED ORDER — FUROSEMIDE 40 MG PO TABS
40.0000 mg | ORAL_TABLET | Freq: Every day | ORAL | Status: DC
  Administered 2024-08-04: 40 mg via ORAL
  Filled 2024-08-03: qty 1

## 2024-08-03 MED ORDER — POLYETHYLENE GLYCOL 3350 17 G PO PACK
17.0000 g | PACK | Freq: Every day | ORAL | Status: DC
Start: 2024-08-03 — End: 2024-08-10
  Administered 2024-08-03 – 2024-08-10 (×8): 17 g via ORAL
  Filled 2024-08-03 (×8): qty 1

## 2024-08-03 NOTE — Progress Notes (Signed)
 PROGRESS NOTE    Carrie Haynes  FMW:[id number]  DOB: 1940/12/01  DOA: 08/01/2024 PCP: Teresa Channel, MD Outpatient Specialists:   Hospital course:  83 year old female string-like poor respiratory reserve due to ILD/pulmonary fibrosis and severe pulmonary hypertension with PASP 93 who has chronic respiratory failure and is on 4 L of oxygen  at baseline and is only able to ambulate halfway across the room at baseline is admitted with increasing fatigue and decreasing exercise tolerance with increased shortness of breath with her usual activity. Patient's O2 saturations were 89 on her usual 4 L of oxygen  with dropped to 79% with ambulation.  Patient also noted to have nonproductive frequent cough. Chest x-ray is without any acute findings. She is noted to have gained 4 pounds.  Patient was admitted for IV diuresis.   Subjective:  Patient states that she does feel better today than she did on admission notes I can breathe now.  However she is not sure if she feels confident enough to walk even halfway across the room.  Patient states that she is tired of being this way and notes that she is suffering from not being able to breathe properly.  Patient's daughter Andree was tearful.  Long discussion about goals for care and importance of open communication within the family.  Both patient and daughter note that they are very open with each other but they try to spare each other's feelings.   Objective: Vitals:   08/03/24 1529 08/03/24 1536 08/03/24 1550 08/03/24 1628  BP:    109/64  Pulse:    76  Resp:    19  Temp:    98 F (36.7 C)  TempSrc:    Oral  SpO2: 92% 94% 93% 96%  Weight:      Height:        Intake/Output Summary (Last 24 hours) at 08/03/2024 1943 Last data filed at 08/03/2024 1515 Gross per 24 hour  Intake 120 ml  Output 2745 ml  Net -2625 ml   Filed Weights   08/02/24 0007 08/02/24 1500 08/03/24 0432  Weight: 88.5 kg 92.5 kg 90.3 kg     Exam:  General:  Barrel chested female sitting up in bed at 35 degrees with tachypnea unlabored at rest but with mild labored when speaking. Eyes: sclera anicteric, conjuctiva mild injection bilaterally CVS: S1-S2, regular  Respiratory: Coarse fibrotic crackles throughout GI: NABS, soft, NT  LE: Warm and well-perfused Neuro: A/O x 3,  grossly nonfocal.  Psych: patient is logical and coherent, judgement and insight appear normal, mood and affect appropriate to situation.  Data Reviewed:  Basic Metabolic Panel: Recent Labs  Lab 08/02/24 0016 08/02/24 0429 08/03/24 0256  NA 134*  --  135  K 4.1  --  4.2  CL 96*  --  99  CO2 23  --  25  GLUCOSE 130*  --  103*  BUN 33*  --  26*  CREATININE 1.38*  --  1.13*  CALCIUM  8.7*  --  8.5*  MG  --  2.0 1.9    CBC: Recent Labs  Lab 08/02/24 0016  WBC 6.5  HGB 10.7*  HCT 33.2*  MCV 94.6  PLT 212     Scheduled Meds:  atorvastatin   20 mg Oral QPM   cholecalciferol   5,000 Units Oral QPM   COVID-19 mRNA vaccine (Pfizer)  0.3 mL Intramuscular Once   furosemide   40 mg Intravenous Daily   insulin  aspart  0-15 Units Subcutaneous TID WC   insulin   aspart  0-5 Units Subcutaneous QHS   ipratropium-albuterol   3 mL Nebulization QID   latanoprost  1 drop Both Eyes QHS   magnesium  oxide  400 mg Oral QPM   metoprolol  succinate  25 mg Oral Daily   Pirfenidone   801 mg Oral TID WC   polyethylene glycol  17 g Oral Daily   rivaroxaban   15 mg Oral Q supper   sodium chloride  flush  3 mL Intravenous Q12H   Continuous Infusions:   Assessment & Plan:   Acute on chronic hypoxic respiratory failure Interstitial lung disease/idiopathic pulmonary fibrosis Severe pulmonary hypertension End-stage lung disease Possible decompensation of HFpEF Patient with minimal respiratory reserve. Exact cause of patient's decompensation is not entirely clear but possibly secondary to weight gain from decompensated HFpEF.   Patient has diuresed well to Lasix  40 mg IV  daily Patient does feel that she is breathing better but of note her O2 has been increased to 7 L Will try to dial patient down back to her usual 4.5 L of oxygen  Ambulatory trial on her usual oxygen  tomorrow Pirfenidone  was continued  HTN HFpEF Patient diuresing well on 40 IV of Lasix  she is down by 2 pounds and has put out 1 L yesterday. Will switch back to p.o. Lasix  tomorrow as per cardiology recommendations Continue Toprol -XL 25 per home doses   Discussion about goals for care Patient and daughter Andree had frank discussion about her goals for care Daughter was tearful but understood her mothers position of being tired I offered palliative care consultation, they said they would think about it Patient does have a living well and Andree is her healthcare power of attorney At this point she is still full code however we did discuss that she would not be able to come off of the ventilator if she were to be intubated.  Patient and Koren will discuss it further and will let us  know tomorrow if they are interested in palliative care consultation  Paroxysmal atrial fibrillation Prolonged Qtc Dofetilide  being held Prescient cardiology consultation who started dofetilide  250 every 12 tomorrow after IV Lasix  has been switched to oral. Repeat EKG in the morning has been ordered Continue Xarelto   DM 2 Blood sugars under reasonable control on present regimen    DVT prophylaxis: Xarelto  Code Status: Full Family Communication: Daughter was at bedside throughout     Studies: DG Chest Portable 1 View Result Date: 08/02/2024 EXAM: 1 VIEW(S) XRAY OF THE CHEST 08/02/2024 12:33:06 AM COMPARISON: 04/21/2024 CLINICAL HISTORY: sob. home via [facility] with sob, wears 4LNC, hx of pulmonary fibrosis. Pt reports bilateral leg swelling. FINDINGS: LUNGS AND PLEURA: Chronic coarsened interstitial markings, likely reflecting chronic interstitial lung disease. No focal pulmonary opacity. No pulmonary edema. No  pleural effusion. No pneumothorax. HEART AND MEDIASTINUM: Cardiomegaly. Atherosclerotic calcifications. BONES AND SOFT TISSUES: No acute osseous abnormality. IMPRESSION: 1. Cardiomegaly. 2. Stable chronic interstitial lung disease. Electronically signed by: Pinkie Pebbles MD 08/02/2024 12:45 AM EDT RP Workstation: [id number]    Principal Problem:   Acute on chronic heart failure with preserved ejection fraction (HFpEF) (HCC) Active Problems:   Acute on chronic respiratory failure with hypoxia (HCC)   Acute on chronic heart failure with preserved ejection fraction (HCC)     Tamitha Norell Vangie Pike, [facility]  If 7PM-7AM, please contact night-coverage [email or web address]   LOS: 0 days

## 2024-08-03 NOTE — Progress Notes (Signed)
 Progress Note  Patient Name: Carrie Haynes Date of Encounter: 08/03/2024  Primary Cardiologist: Jerel Balding, MD   Subjective   No chest pain. Chronic dyspnea persists.   Inpatient Medications    Scheduled Meds:  atorvastatin   20 mg Oral QPM   cholecalciferol   5,000 Units Oral QPM   COVID-19 mRNA vaccine (Pfizer)  0.3 mL Intramuscular Once   furosemide   40 mg Intravenous Daily   Influenza vac split trivalent PF  0.5 mL Intramuscular Tomorrow-1000   insulin  aspart  0-15 Units Subcutaneous TID WC   insulin  aspart  0-5 Units Subcutaneous QHS   ipratropium-albuterol   3 mL Nebulization QID   latanoprost  1 drop Both Eyes QHS   magnesium  oxide  400 mg Oral QPM   metoprolol  succinate  25 mg Oral Daily   Pirfenidone   801 mg Oral TID WC   rivaroxaban   15 mg Oral Q supper   sodium chloride  flush  3 mL Intravenous Q12H   Continuous Infusions:  PRN Meds: acetaminophen  **OR** acetaminophen    Vital Signs    Vitals:   08/03/24 0432 08/03/24 0745 08/03/24 0837 08/03/24 0839  BP: 125/75 114/70  99/65  Pulse: 66 88    Resp: 18 19    Temp: 97.8 F (36.6 C) 98.1 F (36.7 C)    TempSrc: Oral Oral    SpO2: 96% 94% 95%   Weight: 90.3 kg     Height:        Intake/Output Summary (Last 24 hours) at 08/03/2024 1144 Last data filed at 08/03/2024 1000 Gross per 24 hour  Intake 240 ml  Output 1195 ml  Net -955 ml   Filed Weights   08/02/24 0007 08/02/24 1500 08/03/24 0432  Weight: 88.5 kg 92.5 kg 90.3 kg    Telemetry    Nsr with PAC's - Personally Reviewed  ECG    NSR with PAC's - Personally Reviewed  Physical Exam   GEN: chronically ill No acute distress.   Neck: No JVD Cardiac: IRRR, no murmurs, rubs, or gallops.  Respiratory: Clear to auscultation bilaterally. GI: Soft, nontender, non-distended  MS: No edema; No deformity. Neuro:  Nonfocal  Psych: Normal affect   Labs    Chemistry Recent Labs  Lab 08/02/24 0016 08/03/24 0256  NA 134* 135  K 4.1  4.2  CL 96* 99  CO2 23 25  GLUCOSE 130* 103*  BUN 33* 26*  CREATININE 1.38* 1.13*  CALCIUM  8.7* 8.5*  GFRNONAA 39* 49*  ANIONGAP 15 11     Hematology Recent Labs  Lab 08/02/24 0016  WBC 6.5  RBC 3.51*  HGB 10.7*  HCT 33.2*  MCV 94.6  MCH 30.5  MCHC 32.2  RDW 15.9*  PLT 212    Cardiac EnzymesNo results for input(s): TROPONINI in the last 168 hours. No results for input(s): TROPIPOC in the last 168 hours.   BNP Recent Labs  Lab 08/02/24 0200  BNP 317.8*     DDimer No results for input(s): DDIMER in the last 168 hours.   Radiology    DG Chest Portable 1 View Result Date: 08/02/2024 EXAM: 1 VIEW(S) XRAY OF THE CHEST 08/02/2024 12:33:06 AM COMPARISON: 04/21/2024 CLINICAL HISTORY: sob. home via [facility] with sob, wears 4LNC, hx of pulmonary fibrosis. Pt reports bilateral leg swelling. FINDINGS: LUNGS AND PLEURA: Chronic coarsened interstitial markings, likely reflecting chronic interstitial lung disease. No focal pulmonary opacity. No pulmonary edema. No pleural effusion. No pneumothorax. HEART AND MEDIASTINUM: Cardiomegaly. Atherosclerotic calcifications. BONES AND SOFT TISSUES: No  acute osseous abnormality. IMPRESSION: 1. Cardiomegaly. 2. Stable chronic interstitial lung disease. Electronically signed by: Pinkie Pebbles MD 08/02/2024 12:45 AM EDT RP Workstation: [id number]    Cardiac Studies   none  Patient Profile     83 y.o. female admitted with A on C HF, preserved EF. COPD, on chronic home oxygen  for 5 years, non-compliant.   Assessment & Plan    PAF - she is in NSR. I will restart her dofetilide  250 q12 tomorrow once the IV lasix  has been switched to oral. I discussed the importance of maintaining compliance. She was taking it daily.  HFpEF - she has chronic multifactorial dyspnea. Continue lasix  one more day. Switch to po tomorrow.     For questions or updates, please contact [facility] Please consult [email or web address] for contact info under  Cardiology/STEMI.      Signed, Danelle Birmingham, MD  08/03/2024, 11:44 AM

## 2024-08-03 NOTE — Plan of Care (Addendum)
 Patient weaned back to baseline 4.5L [place]. Later, she ambulated well to bathroom, with just standby assist. Patient's baseline is walking around the house without assistance, and her gait looked steady.   Problem: Education: Goal: Individualized Educational Video(s) Outcome: Progressing   Problem: Fluid Volume: Goal: Ability to maintain a balanced intake and output will improve Outcome: Progressing   Problem: Health Behavior/Discharge Planning: Goal: Ability to manage health-related needs will improve Outcome: Progressing

## 2024-08-04 DIAGNOSIS — N1831 Chronic kidney disease, stage 3a: Secondary | ICD-10-CM

## 2024-08-04 DIAGNOSIS — E66812 Obesity, class 2: Secondary | ICD-10-CM

## 2024-08-04 DIAGNOSIS — J84112 Idiopathic pulmonary fibrosis: Secondary | ICD-10-CM | POA: Diagnosis not present

## 2024-08-04 DIAGNOSIS — I10 Essential (primary) hypertension: Secondary | ICD-10-CM

## 2024-08-04 DIAGNOSIS — I5033 Acute on chronic diastolic (congestive) heart failure: Secondary | ICD-10-CM | POA: Diagnosis not present

## 2024-08-04 DIAGNOSIS — E785 Hyperlipidemia, unspecified: Secondary | ICD-10-CM

## 2024-08-04 DIAGNOSIS — I4819 Other persistent atrial fibrillation: Secondary | ICD-10-CM

## 2024-08-04 DIAGNOSIS — E1169 Type 2 diabetes mellitus with other specified complication: Secondary | ICD-10-CM

## 2024-08-04 LAB — BASIC METABOLIC PANEL WITH GFR
Anion gap: 10 (ref 5–15)
Anion gap: 11 (ref 5–15)
BUN: 25 mg/dL — ABNORMAL HIGH (ref 8–23)
BUN: 22 mg/dL (ref 8–23)
CO2: 28 mmol/L (ref 22–32)
CO2: 27 mmol/L (ref 22–32)
Calcium: 8.5 mg/dL — ABNORMAL LOW (ref 8.9–10.3)
Calcium: 8.4 mg/dL — ABNORMAL LOW (ref 8.9–10.3)
Chloride: 94 mmol/L — ABNORMAL LOW (ref 98–111)
Chloride: 93 mmol/L — ABNORMAL LOW (ref 98–111)
Creatinine, Ser: 1.28 mg/dL — ABNORMAL HIGH (ref 0.44–1.00)
Creatinine, Ser: 1.2 mg/dL — ABNORMAL HIGH (ref 0.44–1.00)
GFR, Estimated: 42 mL/min — ABNORMAL LOW (ref >60)
GFR, Estimated: 46 mL/min — ABNORMAL LOW (ref >60)
Glucose, Bld: 124 mg/dL — ABNORMAL HIGH (ref 70–99)
Glucose, Bld: 135 mg/dL — ABNORMAL HIGH (ref 70–99)
Potassium: 4 mmol/L (ref 3.5–5.1)
Potassium: 3.8 mmol/L (ref 3.5–5.1)
Sodium: 132 mmol/L — ABNORMAL LOW (ref 135–145)
Sodium: 131 mmol/L — ABNORMAL LOW (ref 135–145)

## 2024-08-04 LAB — MAGNESIUM
Magnesium: 1.8 mg/dL (ref 1.7–2.4)
Magnesium: 1.9 mg/dL (ref 1.7–2.4)

## 2024-08-04 LAB — GLUCOSE, CAPILLARY
Glucose-Capillary: 120 mg/dL — ABNORMAL HIGH (ref 70–99)
Glucose-Capillary: 133 mg/dL — ABNORMAL HIGH (ref 70–99)
Glucose-Capillary: 128 mg/dL — ABNORMAL HIGH (ref 70–99)
Glucose-Capillary: 164 mg/dL — ABNORMAL HIGH (ref 70–99)

## 2024-08-04 MED ORDER — DOFETILIDE 125 MCG PO CAPS
125.0000 ug | ORAL_CAPSULE | Freq: Two times a day (BID) | ORAL | Status: DC
Start: 2024-08-04 — End: 2024-08-05
  Administered 2024-08-04 (×2): 125 ug via ORAL
  Filled 2024-08-04 (×3): qty 1

## 2024-08-04 MED ORDER — SODIUM CHLORIDE 0.9% FLUSH: 3.0000 mL | INTRAVENOUS | Status: DC | PRN

## 2024-08-04 MED ORDER — FUROSEMIDE 10 MG/ML IJ SOLN
60.0000 mg | Freq: Two times a day (BID) | INTRAMUSCULAR | Status: DC
  Administered 2024-08-04 – 2024-08-06 (×4): 60 mg via INTRAVENOUS
  Filled 2024-08-04 (×4): qty 6

## 2024-08-04 MED ORDER — MAGNESIUM GLUCONATE 500 (27 MG) MG PO TABS
500.0000 mg | ORAL_TABLET | ORAL | Status: AC
  Administered 2024-08-04: 500 mg via ORAL
  Filled 2024-08-04: qty 1

## 2024-08-04 MED ORDER — SPIRONOLACTONE 25 MG PO TABS
25.0000 mg | ORAL_TABLET | Freq: Every day | ORAL | Status: DC
  Administered 2024-08-04 – 2024-08-10 (×7): 25 mg via ORAL
  Filled 2024-08-04 (×7): qty 1

## 2024-08-04 MED ORDER — MAGNESIUM SULFATE 2 GM/50ML IV SOLN
2.0000 g | Freq: Once | INTRAVENOUS | Status: AC
  Administered 2024-08-04: 2 g via INTRAVENOUS
  Filled 2024-08-04: qty 50

## 2024-08-04 MED ORDER — SODIUM CHLORIDE 0.9% FLUSH
3.0000 mL | Freq: Two times a day (BID) | INTRAVENOUS | Status: DC
  Administered 2024-08-04 – 2024-08-10 (×13): 3 mL via INTRAVENOUS

## 2024-08-04 MED ORDER — POTASSIUM CHLORIDE CRYS ER 20 MEQ PO TBCR
40.0000 meq | EXTENDED_RELEASE_TABLET | Freq: Once | ORAL | Status: AC
  Administered 2024-08-04: 40 meq via ORAL
  Filled 2024-08-04: qty 2

## 2024-08-04 MED ORDER — SODIUM CHLORIDE 0.9 % IV SOLN: 250.0000 mL | INTRAVENOUS | Status: AC | PRN

## 2024-08-04 NOTE — Progress Notes (Addendum)
 Progress Note   Patient: Carrie Haynes FMW:[id number] DOB: [birth year] DOA: 08/01/2024     1 DOS: the patient was seen and examined on 08/04/2024   Brief hospital course: Carrie Haynes was admitted to the hospital with the working diagnosis of heart failure exacerbation.   83 year old female with the past medical history of  ILD/pulmonary fibrosis, severe pulmonary hypertension, who presented with worsening dyspnea. Recent hospitalization for heart failure 07/1 to 04/22/24, she was diuresed and discharged on 40 mg of furosemide  and metoprolol  succinate, limited medical therapy due to hypotension.  Over the last 3 days prior to admission she noted rapidly progressive dyspnea and lower extremity edema. Positive weight gain 4 lbs. EMS was called and she was found in respiratory distress with RR at 40 and 02 saturation in the 80%, she was placed on higher flow of 02 per [place] and transported to the ED.  On her initial physical examination her blood pressure was 105/49, RR 26 and 02 saturation 91%  Lungs with diffuse rales, increase work of breathing with minimal efforts, heart with S1 and S2 present and regular with no gallops or rubs, abdomen with no distention and positive lower extremity edema.   Na 134, K 4.1 Cl 96 bicarbonate 23, glucose 130 bun 33 cr 1,38  BNP 317  High sensitive troponin 12 and 12  Wbc 6,5 hgb 10.7 plt 212   Chest radiograph with cardiomegaly, bilateral hilar vascular congestion, bilateral peripheral interstitial infiltrates, more predominant at the lower lobes.   EKG 67 bpm, normal axis, qtc 608, sinus rhythm with low voltage, no significant ST segment or T wave changes.   Assessment and Plan: * Acute on chronic diastolic CHF (congestive heart failure) (HCC) Echocardiogram with preserved LV systolic function EF 55 to 60%, grade I diastolic dysfunction with impaired relaxation, D shaped ventricular septum in systole and diastole, RV with moderate reduction in systolic  function, RV with severe dilatation, RVSP 92.8 mmHg, RA with moderate dilatation, severe tricuspid valve regurgitation, trivial pericardial effusion.   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   Urine output is 1,745 ml Systolic blood pressure 100 mmHg range.   Will change furosemide  to 60 mg IV bid for further diuresis.  RAAS inhibition with spironolactone.  If blood pressure stable possible addition of low dose losartan .   Acute on chronic hypoxemic respiratory failure due to acute cardiogenic pulmonary edema, on ILD and pulmonary hypertension. 02 saturation today is 91% on 5.5 L/min per [place] Continue diuresis   Hypertension Continue blood pressure monitoring   A-fib (HCC) Continue sinus rhythm Continue dofetilide  with close monitoring of qtc interval.  Continue anticoagulation with rivaroxaban .   IPF (idiopathic pulmonary fibrosis) (HCC) Will continue diuresis with IV furosemide .  No signs of acute flare.  Will continue 02 monitoring.  Continue with perifenidone  She declined bronchodilator therapy.   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/min (HCC) Hyponatremia (baseline serum cr 1,2)   Follow up renal function with serum cr at 1,20, K is 3,8 and serum bicarbonate at 27  Na 131 and Mg 1,9   Add Kcl 40 meq and Mg iv to avoid hypokalemia and hypomagnesemia   Type 2 diabetes mellitus with hyperlipidemia (HCC) Continue glucose cover and monitoring with insulin  sliding scale.    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    Subjective: Patient continue to have dyspnea on exertion and fatigue, no chest pain, no palpitations,   Physical Exam: Vitals:   08/04/24 0007 08/04/24 0417 08/04/24  0705 08/04/24 1211  BP: 109/71 (!) 105/58 122/76 (!) 107/52  Pulse: 87 87 79 87  Resp:  19 18 18   Temp:  (!) 97.3 F (36.3 C) 97.7 F (36.5 C) 97.7 F (36.5 C)  TempSrc:  Oral Oral Oral  SpO2: 91% 90% 98% 91%  Weight:  91.1 kg    Height:       Neurology awake and  alert, deconditioned and ill looking appearing ENT with mild pallor with no icterus Cardiovascular with S1 and S2 present, irregularly irregular with no gallops, or rubs Respiratory with bilateral diffuse rales and scattered rhonchi with no wheezing Abdomen with no distention  Lower extremity edema +   Data Reviewed:    Family Communication: I spoke with patient's family at the bedside, we talked in detail about patient's condition, plan of care and prognosis and all questions were addressed.   Disposition: Status is: Inpatient Remains inpatient appropriate because: respiratory failure   Planned Discharge Destination: Home     Author: Elidia Toribio Furnace, MD 08/04/2024 3:46 PM  For on call review [email or web address].

## 2024-08-04 NOTE — Assessment & Plan Note (Signed)
 Hyponatremia (baseline serum cr 1,2)   Today renal function with serum cr at 1,27 with K at 4.4 and serum bicarbonate at 29  Na 132 and Mg 2.1   Continue diuresis and follow up renal function and electrolytes in am

## 2024-08-04 NOTE — Assessment & Plan Note (Signed)
 Paroxysmal atrial fibrillation  Continue sinus rhythm Continue dofetilide  with close monitoring of qtc interval.  Continue with metoprolol  Continue anticoagulation with rivaroxaban .

## 2024-08-04 NOTE — Assessment & Plan Note (Addendum)
 Echocardiogram with preserved LV systolic function EF 55 to 60%, grade I diastolic dysfunction with impaired relaxation, D shaped ventricular septum in systole and diastole, RV with moderate reduction in systolic function, RV with severe dilatation, RVSP 92.8 mmHg, RA with moderate dilatation, severe tricuspid valve regurgitation, trivial pericardial effusion.   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   Urine output is 2350  ml Systolic blood pressure 100 mmHg range.   Transitioned to oral furosemide  80 mg po bid   RAAS inhibition with spironolactone.  Continue with metoprolol  If blood pressure stable possible addition of low dose losartan .   Acute on chronic hypoxemic respiratory failure due to acute cardiogenic pulmonary edema, on ILD and pulmonary hypertension. 02 saturation today is 94% on 6L/min per [place] Continue diuresis

## 2024-08-04 NOTE — Assessment & Plan Note (Addendum)
 Will continue diuresis with IV furosemide .  No signs of acute flare.  Will continue 02 monitoring.  Continue with perifenidone  She declined bronchodilator therapy.

## 2024-08-04 NOTE — Plan of Care (Signed)
  Problem: Education: Goal: Ability to describe self-care measures that may prevent or decrease complications (Diabetes Survival Skills Education) will improve Outcome: Progressing Goal: Individualized Educational Video(s) Outcome: Progressing   Problem: Coping: Goal: Ability to adjust to condition or change in health will improve Outcome: Progressing   Problem: Fluid Volume: Goal: Ability to maintain a balanced intake and output will improve Outcome: Progressing   Problem: Health Behavior/Discharge Planning: Goal: Ability to identify and utilize available resources and services will improve Outcome: Progressing Goal: Ability to manage health-related needs will improve Outcome: Progressing   Problem: Metabolic: Goal: Ability to maintain appropriate glucose levels will improve Outcome: Progressing   Problem: Nutritional: Goal: Maintenance of adequate nutrition will improve Outcome: Progressing Goal: Progress toward achieving an optimal weight will improve Outcome: Progressing   Problem: Tissue Perfusion: Goal: Adequacy of tissue perfusion will improve Outcome: Progressing   Problem: Education: Goal: Knowledge of General Education information will improve Description: Including pain rating scale, medication(s)/side effects and non-pharmacologic comfort measures Outcome: Progressing   Problem: Health Behavior/Discharge Planning: Goal: Ability to manage health-related needs will improve Outcome: Progressing   Problem: Clinical Measurements: Goal: Ability to maintain clinical measurements within normal limits will improve Outcome: Progressing Goal: Will remain free from infection Outcome: Progressing Goal: Diagnostic test results will improve Outcome: Progressing Goal: Respiratory complications will improve Outcome: Progressing Goal: Cardiovascular complication will be avoided Outcome: Progressing   Problem: Activity: Goal: Risk for activity intolerance will  decrease Outcome: Progressing   Problem: Nutrition: Goal: Adequate nutrition will be maintained Outcome: Progressing   Problem: Coping: Goal: Level of anxiety will decrease Outcome: Progressing   Problem: Elimination: Goal: Will not experience complications related to bowel motility Outcome: Progressing Goal: Will not experience complications related to urinary retention Outcome: Progressing   Problem: Pain Managment: Goal: General experience of comfort will improve and/or be controlled Outcome: Progressing   Problem: Safety: Goal: Ability to remain free from injury will improve Outcome: Progressing   Problem: Skin Integrity: Goal: Risk for impaired skin integrity will decrease Outcome: Progressing

## 2024-08-04 NOTE — Progress Notes (Signed)
 Pharmacy: Dofetilide  (Tikosyn ) - Initial Consult Assessment and Electrolyte Replacement  Pharmacy consulted to assist in monitoring and replacing electrolytes in this 83 y.o. female admitted on 08/01/2024 undergoing dofetilide  re-initiation. First dofetilide  dose: 10/19 at 1200  Assessment:  Patient Exclusion Criteria: If any screening criteria checked as Yes, then  patient  should NOT receive dofetilide  until criteria item is corrected.  If "Yes" please indicate correction plan.  YES  NO Patient  Exclusion Criteria Correction Plan/Comments   [x]   []   Baseline QTc interval is greater than or equal to 440 msec. IF above YES box checked dofetilide  contraindicated unless patient has ICD; then may proceed if QTc 500-550 msec or with known ventricular conduction abnormalities may proceed with QTc 550-600 msec. QTc = 0.41 QTC is 462. Cardiology is aware it is borderline and is okay to initiate.   []   [x]   Patient is known or suspected to have a digoxin  level greater than 2 ng/ml: No results found for: DIGOXIN      []   [x]   Creatinine clearance less than 20 ml/min (calculated using Cockcroft-Gault, actual body weight and serum creatinine): Estimated Creatinine Clearance: 35.3 mL/min (A) (by C-G formula based on SCr of 1.28 mg/dL (H)).     []   [x]  Patient has received drugs known to prolong the QT intervals within the last 48 hour (examples: phenothiazines, tricyclics or tetracyclic antidepressants, macrolides, 1st generation H-1 antihistamines (especially diphenhydramine), fluoroquinolones, azoles, ondansetron , metoclopramide, promethazine).   Updated information on QT prolonging agents is available to be searched on the following database:QT prolonging agents -If SSRI or antihistamine needed, preferred options are sertraline and loratadine respectively     []   [x]  Patient received a dose of a thiazide diuretic in the last 48 hours [including hydrochlorothiazide (Oretic) alone or in  any combination including triamterene (Dyazide, Maxzide)].    []   [x]  Patient received a medication known to increase dofetilide  plasma concentrations prior to initial dofetilide  dose:  Trimethoprim (Primsol, Proloprim) in the last 36 hours Verapamil  (Calan , Verelan ) in the last 36 hours or a sustained release dose in the last 72 hours Megestrol (Megace) in the last 5 days  Cimetidine (Tagamet) in the last 6 hours Ketoconazole (Nizoral) in the last 24 hours Itraconazole (Sporanox) in the last 48 hours  Prochlorperazine (Compazine) in the last 36 hours     []   [x]   Patient is known to have a history of torsades de pointes; congenital or acquired long QT syndromes.    []   [x]   Patient has received a Class 1 and Class 3 antiarrhythmic with less than 2 half-lives since last dose. (Disopyramide, Quinidine, Procainamide, Lidocaine , Mexiletine, Flecainide, Propafenone, Sotalol, Dronedarone)    []   [x]   Patient has received amiodarone  therapy in the past 3 months or amiodarone  level is greater than 0.3 ng/ml.    Labs:    Component Value Date/Time   K 4.0 08/04/2024 0332   MG 1.8 08/04/2024 9667     Plan: [facility] One Calculated CrCl  Dose q12h  []  > 60 ml/min 500 mcg  []  40-60 ml/min 250 mcg  [x]  20-40 ml/min 125 mcg   [x]   Physician selected initial dose within range recommended for patients level of renal function - will monitor for response.  []   Physician selected initial dose outside of range recommended for patients level of renal function - will discuss if the dose should be altered at this time.   Patient has been appropriately anticoagulated with Rivaroxaban .  Potassium: K >/= 4: Appropriate  to initiate Tikosyn , no replacement needed    Magnesium : Mg 1.8-2: Give Mg 2 gm IV x1 to prevent Mg from dropping below 1.8 - do not need to recheck Mg. Appropriate to initiate Tikosyn    Thank you for allowing pharmacy to be involved with this patient's care.  Mendel Barter,  PharmD PGY1 Clinical Pharmacist [facility]  08/04/2024 10:43 AM

## 2024-08-04 NOTE — Assessment & Plan Note (Signed)
Continue glucose cover and monitoring with insulin sliding scale.  Continue with statin therapy.

## 2024-08-04 NOTE — Assessment & Plan Note (Signed)
Calculated BMI is 39.2

## 2024-08-04 NOTE — Assessment & Plan Note (Signed)
 Continue losartan  for blood pressure control.

## 2024-08-04 NOTE — Progress Notes (Signed)
 Progress Note  Patient Name: Carrie Haynes Date of Encounter: 08/04/2024  Primary Cardiologist: Jerel Balding, MD   Subjective   No chest pain or sob.   Inpatient Medications    Scheduled Meds:  atorvastatin   20 mg Oral QPM   cholecalciferol   5,000 Units Oral QPM   COVID-19 mRNA vaccine (Pfizer)  0.3 mL Intramuscular Once   furosemide   40 mg Oral Daily   insulin  aspart  0-15 Units Subcutaneous TID WC   insulin  aspart  0-5 Units Subcutaneous QHS   latanoprost  1 drop Both Eyes QHS   magnesium  oxide  400 mg Oral QPM   metoprolol  succinate  25 mg Oral Daily   Pirfenidone   801 mg Oral TID WC   polyethylene glycol  17 g Oral Daily   rivaroxaban   15 mg Oral Q supper   sodium chloride  flush  3 mL Intravenous Q12H   Continuous Infusions:  PRN Meds: acetaminophen  **OR** acetaminophen    Vital Signs    Vitals:   08/03/24 2155 08/04/24 0007 08/04/24 0417 08/04/24 0705  BP: 125/79 109/71 (!) 105/58 122/76  Pulse: 90 87 87 79  Resp: 19  19 18   Temp: (!) 97.5 F (36.4 C)  (!) 97.3 F (36.3 C) 97.7 F (36.5 C)  TempSrc: Oral  Oral Oral  SpO2: 99% 91% 90% 98%  Weight:   91.1 kg   Height:        Intake/Output Summary (Last 24 hours) at 08/04/2024 0951 Last data filed at 08/04/2024 0848 Gross per 24 hour  Intake 600 ml  Output 1575 ml  Net -975 ml   Filed Weights   08/02/24 1500 08/03/24 0432 08/04/24 0417  Weight: 92.5 kg 90.3 kg 91.1 kg    Telemetry    Nsr with PVC's - Personally Reviewed  ECG    nsr - Personally Reviewed  Physical Exam   GEN: No acute distress.   Neck: No JVD Cardiac: RRR, no murmurs, rubs, or gallops.  Respiratory: Clear to auscultation bilaterally. GI: Soft, nontender, non-distended  MS: No edema; No deformity. Neuro:  Nonfocal  Psych: Normal affect   Labs    Chemistry Recent Labs  Lab 08/02/24 0016 08/03/24 0256 08/04/24 0332  NA 134* 135 132*  K 4.1 4.2 4.0  CL 96* 99 94*  CO2 23 25 28   GLUCOSE 130* 103* 124*   BUN 33* 26* 25*  CREATININE 1.38* 1.13* 1.28*  CALCIUM  8.7* 8.5* 8.5*  GFRNONAA 39* 49* 42*  ANIONGAP 15 11 10      Hematology Recent Labs  Lab 08/02/24 0016  WBC 6.5  RBC 3.51*  HGB 10.7*  HCT 33.2*  MCV 94.6  MCH 30.5  MCHC 32.2  RDW 15.9*  PLT 212    Cardiac EnzymesNo results for input(s): TROPONINI in the last 168 hours. No results for input(s): TROPIPOC in the last 168 hours.   BNP Recent Labs  Lab 08/02/24 0200  BNP 317.8*     DDimer No results for input(s): DDIMER in the last 168 hours.   Radiology    No results found.  Cardiac Studies   none  Patient Profile     83 y.o. 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  Assessment & Plan    PAF - she is maintaining NSR. Her QTC is borderline as is her renal function. We will start dofetilide  125 bid. Because she sleeps until noon and goes to bed at 0300, we will start  at 1200 today.  HFpEF - she is about at her baseline and IV lasix  switched to oral.     For questions or updates, please contact [facility] Please consult [email or web address] for contact info under Cardiology/STEMI.      Signed, Danelle Birmingham, MD  08/04/2024, 9:51 AM

## 2024-08-04 NOTE — Hospital Course (Addendum)
 Carrie Haynes was admitted to the hospital with the working diagnosis of heart failure exacerbation.   83 year old female with the past medical history of  ILD/pulmonary fibrosis, severe pulmonary hypertension, who presented with worsening dyspnea. Recent hospitalization for heart failure 07/1 to 04/22/24, she was diuresed and discharged on 40 mg of furosemide  and metoprolol  succinate, limited medical therapy due to hypotension.  Over the last 3 days prior to admission she noted rapidly progressive dyspnea and lower extremity edema. Positive weight gain 4 lbs. EMS was called and she was found in respiratory distress with RR at 40 and 02 saturation in the 80%, she was placed on higher flow of 02 per [place] and transported to the ED.  On her initial physical examination her blood pressure was 105/49, RR 26 and 02 saturation 91%  Lungs with diffuse rales, increase work of breathing with minimal efforts, heart with S1 and S2 present and regular with no gallops or rubs, abdomen with no distention and positive lower extremity edema.   Na 134, K 4.1 Cl 96 bicarbonate 23, glucose 130 bun 33 cr 1,38  BNP 317  High sensitive troponin 12 and 12  Wbc 6,5 hgb 10.7 plt 212   Chest radiograph with cardiomegaly, bilateral hilar vascular congestion, bilateral peripheral interstitial infiltrates, more predominant at the lower lobes.   EKG 67 bpm, normal axis, qtc 608, sinus rhythm with low voltage, no significant ST segment or T wave changes.   10/20 responding well to diuresis  10/21 clinically responding to diuresis

## 2024-08-05 DIAGNOSIS — I480 Paroxysmal atrial fibrillation: Secondary | ICD-10-CM | POA: Diagnosis not present

## 2024-08-05 DIAGNOSIS — N1831 Chronic kidney disease, stage 3a: Secondary | ICD-10-CM

## 2024-08-05 DIAGNOSIS — I4819 Other persistent atrial fibrillation: Secondary | ICD-10-CM | POA: Diagnosis not present

## 2024-08-05 DIAGNOSIS — I5033 Acute on chronic diastolic (congestive) heart failure: Secondary | ICD-10-CM | POA: Diagnosis not present

## 2024-08-05 DIAGNOSIS — J84112 Idiopathic pulmonary fibrosis: Secondary | ICD-10-CM | POA: Diagnosis not present

## 2024-08-05 DIAGNOSIS — I10 Essential (primary) hypertension: Secondary | ICD-10-CM | POA: Diagnosis not present

## 2024-08-05 LAB — GLUCOSE, CAPILLARY
Glucose-Capillary: 129 mg/dL — ABNORMAL HIGH (ref 70–99)
Glucose-Capillary: 95 mg/dL (ref 70–99)
Glucose-Capillary: 140 mg/dL — ABNORMAL HIGH (ref 70–99)
Glucose-Capillary: 207 mg/dL — ABNORMAL HIGH (ref 70–99)

## 2024-08-05 LAB — BASIC METABOLIC PANEL WITH GFR
Anion gap: 11 (ref 5–15)
BUN: 25 mg/dL — ABNORMAL HIGH (ref 8–23)
CO2: 27 mmol/L (ref 22–32)
Calcium: 8.5 mg/dL — ABNORMAL LOW (ref 8.9–10.3)
Chloride: 95 mmol/L — ABNORMAL LOW (ref 98–111)
Creatinine, Ser: 1.1 mg/dL — ABNORMAL HIGH (ref 0.44–1.00)
GFR, Estimated: 51 mL/min — ABNORMAL LOW (ref >60)
Glucose, Bld: 117 mg/dL — ABNORMAL HIGH (ref 70–99)
Potassium: 4.5 mmol/L (ref 3.5–5.1)
Sodium: 133 mmol/L — ABNORMAL LOW (ref 135–145)

## 2024-08-05 LAB — MAGNESIUM: Magnesium: 2.1 mg/dL (ref 1.7–2.4)

## 2024-08-05 MED ORDER — RIVAROXABAN 20 MG PO TABS
20.0000 mg | ORAL_TABLET | Freq: Every day | ORAL | Status: DC
  Administered 2024-08-05 – 2024-08-09 (×5): 20 mg via ORAL
  Filled 2024-08-05 (×5): qty 1

## 2024-08-05 NOTE — Progress Notes (Signed)
 PHARMACY - ANTICOAGULATION CONSULT NOTE  Pharmacy Consult for Xarelto  Indication: atrial fibrillation  Allergies  Allergen Reactions   Calcium  Channel Blockers Other (See Comments)    Acute hypotension, patient became brady/asystole < 5 seconds   Estrogens Conjugated Other (See Comments)    Other reaction(s): DVT   Nystatin Hives    Patient Measurements: Height: 5' (152.4 cm) Weight: 93.4 kg (205 lb 14.6 oz) IBW/kg (Calculated) : 45.5 HEPARIN  DW (KG): 67.6  Vital Signs: Temp: 97.6 F (36.4 C) (10/20 0750) Temp Source: Oral (10/20 0750) BP: 103/58 (10/20 0824) Pulse Rate: 79 (10/20 0824)  Labs: Recent Labs    08/04/24 0332 08/04/24 1011 08/05/24 0225  CREATININE 1.28* 1.20* 1.10*    Estimated Creatinine Clearance: 41.7 mL/min (A) (by C-G formula based on SCr of 1.1 mg/dL (H)).   Medical History: Past Medical History:  Diagnosis Date   A-fib (HCC)    Diabetes mellitus without complication (HCC)    Hyperlipemia    Hypertension     Medications:  Medications Prior to Admission  Medication Sig Dispense Refill Last Dose/Taking   acetaminophen  (TYLENOL ) 500 MG tablet Take 1,000 mg by mouth every 6 (six) hours as needed for moderate pain (pain score 4-6).   07/31/2024   atorvastatin  (LIPITOR) 20 MG tablet Take 20 mg by mouth every evening.   08/01/2024   Cholecalciferol  (VITAMIN D -3) 5000 units TABS Take 5,000 Units by mouth every evening.   08/01/2024   dofetilide  (TIKOSYN ) 500 MCG capsule TAKE 1 CAPSULE BY MOUTH TWICE A DAY 180 capsule 2 08/01/2024 Evening   furosemide  (LASIX ) 40 MG tablet Take 1 tablet (40 mg total) by mouth daily. (Patient taking differently: Take 40 mg by mouth every evening.) 90 tablet 0 08/01/2024   Magnesium  Oxide 500 MG TABS Take 500 mg by mouth every evening.   08/01/2024   metFORMIN (GLUCOPHAGE-XR) 500 MG 24 hr tablet Take 500 mg by mouth at bedtime.   08/01/2024   metoprolol  succinate (TOPROL -XL) 25 MG 24 hr tablet Take 1 tablet (25 mg  total) by mouth daily. 90 tablet 0 08/01/2024   Pirfenidone  (ESBRIET ) 801 MG TABS Take 1 tablet (801 mg total) by mouth 3 (three) times daily with meals. 270 tablet 1 08/01/2024 Evening   rivaroxaban  (XARELTO ) 20 MG TABS tablet Take 1 tablet (20 mg total) by mouth daily with supper. 90 tablet 1 08/01/2024 at  7:30 PM   Scheduled:   atorvastatin   20 mg Oral QPM   cholecalciferol   5,000 Units Oral QPM   furosemide   60 mg Intravenous Q12H   insulin  aspart  0-15 Units Subcutaneous TID WC   insulin  aspart  0-5 Units Subcutaneous QHS   latanoprost  1 drop Both Eyes QHS   magnesium  oxide  400 mg Oral QPM   metoprolol  succinate  25 mg Oral Daily   Pirfenidone   801 mg Oral TID WC   polyethylene glycol  17 g Oral Daily   rivaroxaban   20 mg Oral Q supper   sodium chloride  flush  3 mL Intravenous Q12H   sodium chloride  flush  3 mL Intravenous Q12H   spironolactone  25 mg Oral Daily    Assessment: 60 female here with HF and also re-loading Tikosyn . She is on Xarelto  PTA for afib (20mg  po daily) and pharmacy consulted for dosing (she is currently on Xarelto  15mg  po daily)  -SCr 1.1 (down from 1.38 on 10/17), Wt= 93.4kg; CrCl ~ 60 using total body weight  Goal of Therapy:  Monitor platelets by  anticoagulation protocol: Yes   Plan:  -Change Xarelto  to 20mg  po daily  Prentice Poisson, PharmD Clinical Pharmacist **Pharmacist phone directory can now be found on [email or web address] ([email or web address]).  Listed under [facility].

## 2024-08-05 NOTE — Progress Notes (Signed)
 Progress Note   Patient: Carrie Haynes FMW:[id number] DOB: Aug 28, 1941 DOA: 08/01/2024     2 DOS: the patient was seen and examined on 08/05/2024   Brief hospital course: Carrie Haynes was admitted to the hospital with the working diagnosis of heart failure exacerbation.   83 year old female with the past medical history of  ILD/pulmonary fibrosis, severe pulmonary hypertension, who presented with worsening dyspnea. Recent hospitalization for heart failure 07/1 to 04/22/24, she was diuresed and discharged on 40 mg of furosemide  and metoprolol  succinate, limited medical therapy due to hypotension.  Over the last 3 days prior to admission she noted rapidly progressive dyspnea and lower extremity edema. Positive weight gain 4 lbs. EMS was called and she was found in respiratory distress with RR at 40 and 02 saturation in the 80%, she was placed on higher flow of 02 per [place] and transported to the ED.  On her initial physical examination her blood pressure was 105/49, RR 26 and 02 saturation 91%  Lungs with diffuse rales, increase work of breathing with minimal efforts, heart with S1 and S2 present and regular with no gallops or rubs, abdomen with no distention and positive lower extremity edema.   Na 134, K 4.1 Cl 96 bicarbonate 23, glucose 130 bun 33 cr 1,38  BNP 317  High sensitive troponin 12 and 12  Wbc 6,5 hgb 10.7 plt 212   Chest radiograph with cardiomegaly, bilateral hilar vascular congestion, bilateral peripheral interstitial infiltrates, more predominant at the lower lobes.   EKG 67 bpm, normal axis, qtc 608, sinus rhythm with low voltage, no significant ST segment or T wave changes.   10/20 responding well to diuresis   Assessment and Plan: * Acute on chronic diastolic CHF (congestive heart failure) (HCC) Echocardiogram with preserved LV systolic function EF 55 to 60%, grade I diastolic dysfunction with impaired relaxation, D shaped ventricular septum in systole and diastole, RV  with moderate reduction in systolic function, RV with severe dilatation, RVSP 92.8 mmHg, RA with moderate dilatation, severe tricuspid valve regurgitation, trivial pericardial effusion.   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   Urine output is 1,300  ml Systolic blood pressure 100 mmHg range.   Will change furosemide  to 60 mg IV bid for further diuresis.  RAAS inhibition with spironolactone.  If blood pressure stable possible addition of low dose losartan .   Acute on chronic hypoxemic respiratory failure due to acute cardiogenic pulmonary edema, on ILD and pulmonary hypertension. 02 saturation today is 94% on 7L/min per [place] Continue diuresis   Hypertension Continue blood pressure monitoring   A-fib (HCC) Continue sinus rhythm Continue dofetilide  with close monitoring of qtc interval.  Continue anticoagulation with rivaroxaban .   IPF (idiopathic pulmonary fibrosis) (HCC) Will continue diuresis with IV furosemide .  No signs of acute flare.  Will continue 02 monitoring.  Continue with perifenidone  She declined bronchodilator therapy.   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/min (HCC) Hyponatremia (baseline serum cr 1,2)   Renal function with serum cr at 1.1 with K at 4.5 and serum bicarbonate at 27  Na 133  Continue diuresis and follow up renal function and electrolytes in am  Type 2 diabetes mellitus with hyperlipidemia (HCC) Continue glucose cover and monitoring with insulin  sliding scale.    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      Subjective: patient with improvement in dyspnea but not yet back to her baseline, no chest pain   Physical Exam: Vitals:   08/05/24 0359  08/05/24 0750 08/05/24 0824 08/05/24 1150  BP: 129/75 (!) 103/58 (!) 103/58 (!) 101/58  Pulse: 63 79 79 80  Resp: 18 18  18   Temp: 97.8 F (36.6 C) 97.6 F (36.4 C)  97.9 F (36.6 C)  TempSrc: Oral Oral  Oral  SpO2: 94% 93%  90%  Weight:      Height:       Neurology  awake and alert ENT with mild pallor Cardiovascular with S1 and S2 present and regular with no gallops, or rubs, positive systolic murmur at the left lower sternal border Respiratory with bilateral rales and scattered rhonchi Abdomen with no distention Positive lower extremity edema   Data Reviewed:    Family Communication: I spoke with patient's family at the bedside, we talked in detail about patient's condition, plan of care and prognosis and all questions were addressed.   Disposition: Status is: Inpatient Remains inpatient appropriate because: recovering heart failure   Planned Discharge Destination: Home     Author: Elidia Toribio Furnace, MD 08/05/2024 1:00 PM  For on call review [email or web address].

## 2024-08-05 NOTE — Progress Notes (Signed)
 Mobility Specialist Progress Note:    08/05/24 1102  Mobility  Activity Turned to back - supine (Resisted bed exercises (ankle pump, leg lifts, heel slides))  Level of Assistance Standby assist, set-up cues, supervision of patient - no hands on  Assistive Device None  Range of Motion/Exercises Left leg;Right leg  Activity Response Tolerated well  Mobility Referral Yes  Mobility visit 1 Mobility  Mobility Specialist Start Time (ACUTE ONLY) 1102  Mobility Specialist Stop Time (ACUTE ONLY) 1122  Mobility Specialist Time Calculation (min) (ACUTE ONLY) 20 min   Received pt laying in bed w/ daughter in room. Somewhat resistant w/ ambulating and session but agreeable to do some handheld resistive exercises. No c/o any symptoms. Pt feeling strong w/ exercises. Left pt in bed w/ all needs met.   Venetia Keel Mobility Specialist Please Neurosurgeon or Rehab Office at [phone]

## 2024-08-05 NOTE — Progress Notes (Addendum)
 Rounding Note   Patient Name: Carrie Haynes Date of Encounter: 08/05/2024  [facility] Cardiologist: Jerel Balding, MD   Subjective  No CP Got very SOB last night to the BR, had BM, washed up >> required increased O2 Had a very productive cough >> feels a bit better this morning, though not quite to her baseline pulm-wise  Scheduled Meds:  atorvastatin   20 mg Oral QPM   cholecalciferol   5,000 Units Oral QPM   furosemide   60 mg Intravenous Q12H   insulin  aspart  0-15 Units Subcutaneous TID WC   insulin  aspart  0-5 Units Subcutaneous QHS   latanoprost  1 drop Both Eyes QHS   magnesium  oxide  400 mg Oral QPM   metoprolol  succinate  25 mg Oral Daily   Pirfenidone   801 mg Oral TID WC   polyethylene glycol  17 g Oral Daily   rivaroxaban   15 mg Oral Q supper   sodium chloride  flush  3 mL Intravenous Q12H   sodium chloride  flush  3 mL Intravenous Q12H   spironolactone  25 mg Oral Daily   Continuous Infusions:  sodium chloride      PRN Meds: sodium chloride , acetaminophen  **OR** acetaminophen , sodium chloride  flush   Vital Signs  Vitals:   08/04/24 2329 08/04/24 2335 08/05/24 0300 08/05/24 0359  BP:  118/69  129/75  Pulse: 64 69  63  Resp:  19  18  Temp:  97.8 F (36.6 C)  97.8 F (36.6 C)  TempSrc:  Oral  Oral  SpO2: 93% 96%  94%  Weight:   93.4 kg   Height:        Intake/Output Summary (Last 24 hours) at 08/05/2024 0750 Last data filed at 08/05/2024 0600 Gross per 24 hour  Intake 480 ml  Output 1300 ml  Net -820 ml      08/05/2024    3:00 AM 08/04/2024    4:17 AM 08/03/2024    4:32 AM  Last 3 Weights  Weight (lbs) 205 lb 14.6 oz 200 lb 13.4 oz 199 lb 1.3 oz  Weight (kg) 93.4 kg 91.1 kg 90.302 kg      Telemetry SR, occ PACs, rare PVCs - Personally Reviewed  ECG  SR 68bpm, QTc - Personally Reviewed  Physical Exam  GEN: No acute distress.   Neck: No JVD Cardiac: RRR, no murmurs, rubs, or gallops.  Respiratory: fine crackles  throughout. GI: Soft, nontender, non-distended  MS: trace edema; No deformity. Neuro:  Nonfocal  Psych: Normal affect   Labs High Sensitivity Troponin:   Recent Labs  Lab 08/02/24 0200 08/02/24 0429  TROPONINIHS 12 12     Chemistry Recent Labs  Lab 08/04/24 0332 08/04/24 1011 08/05/24 0225  NA 132* 131* 133*  K 4.0 3.8 4.5  CL 94* 93* 95*  CO2 28 27 27   GLUCOSE 124* 135* 117*  BUN 25* 22 25*  CREATININE 1.28* 1.20* 1.10*  CALCIUM  8.5* 8.4* 8.5*  MG 1.8 1.9 2.1  GFRNONAA 42* 46* 51*  ANIONGAP 10 11 11     Lipids No results for input(s): CHOL, TRIG, HDL, LABVLDL, LDLCALC, CHOLHDL in the last 168 hours.  Hematology Recent Labs  Lab 08/02/24 0016  WBC 6.5  RBC 3.51*  HGB 10.7*  HCT 33.2*  MCV 94.6  MCH 30.5  MCHC 32.2  RDW 15.9*  PLT 212   Thyroid  No results for input(s): TSH, FREET4 in the last 168 hours.  BNP Recent Labs  Lab 08/02/24 0200  BNP  317.8*    DDimer No results for input(s): DDIMER in the last 168 hours.   Radiology  No results found.  Cardiac Studies  Echocardiogram, 04/17/2024 Left ventricular ejection fraction, by estimation, is 55 to 60% . The left ventricle has normal function. The left ventricle has no regional wall motion abnormalities. Left ventricular diastolic parameters are consistent with Grade I diastolic dysfunction ( impaired relaxation) D- shaped ventricular septum suggestive of RV pressure/ volume overload. Right ventricular systolic function is moderately reduced. The right ventricular size is severely enlarged. There is severely elevated pulmonary artery systolic pressure. The estimated right ventricular systolic pressure is 92. 8 mmHg.  Right atrial size was moderately dilated.  The mitral valve is normal in structure. Trivial mitral valve regurgitation. No evidence of mitral stenosis. Moderate mitral annular calcification.  The tricuspid valve is abnormal. Tricuspid valve regurgitation is severe. Systolic  flow reversal in the hepatic vein doppler pattern.  The aortic valve is tricuspid. There is moderate calcification of the aortic valve. Aortic valve regurgitation is not visualized. Aortic valve sclerosis/ calcification is present, without any evidence of aortic stenosis.  The inferior vena cava is dilated in size with < 50% respiratory variability, suggesting right atrial pressure of 15 mmHg.   Right heart cath, 06/22/2024 Findings:   RA = 4 RV = 71/10 PA = 68/21 (42) PCW = 10 Fick cardiac output/index = 4.2/2.2 PVR = 7.5 WU FA sat = 98% PA sat = 59% PAPi = 11.8   Assessment: 1. Moderate PAH with normal left sided pressures. 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 Moderately reduced CO with normal PAPi  Patient Profile   83 y.o. female w/PMHx of  HTN, HLD, DM Chronic hypoxic respiratory failure PAH IPF Chronic CHF (HFpEF) AFib   Admitted with acute and profoundly worsening SOB Acute on chronic CHF Acute/chronic hypoxic resp failure PAH (described as severe)  EP brought on board for guidance on her Tikosyn  given she had only been taking once daily at home (given unusual sleep schedule)  Assessment & Plan    Paroxysmal ? Persistent AFib CHA2DS2Vasc is 6, on xarelto  Chronically on dofetilide  at home (though perhaps taking improperly)  Calc CrCL using actual weight today is 60 Creat is down some today but has been higher this admission and is now back on IV lasix  She had acute worsening of SOB and desat last night with exertion > back on increased O2 needs and IV lasix . She c/w a very productive cough   Will defer to attending cards/IM xarelto  and pharmacy > her Creat has been wobbling >> perhaps heparin  while she is being actively/aggressively diuresed  Tikosyn  was started over the weekend Though given above >> will hold off  Her home sleep schedule is up ~ noon and asleep ~ 0300 Tikosyn  dose likely 1300 and 0100 perhaps Would not resume Tikosyn   (decide dose) until she is otherwise ready to go home and at her baseline pulm status  Further with IM and attending cardiology teams   ease contact [facility] Please consult [email or web address] for contact info under    Signed, Charlies Macario Arthur, PA-C  08/05/2024, 7:50 AM    I have seen, examined the patient, and reviewed the above assessment and plan.    Interval:  No acute overnight events. Patient reports feeling relatively well. No new or acute complaints.   Back on oxygen  and IV Lasix .  General: Chronically ill-appearing, in no acute distress.  Neck: No  JVD.  Cardiac: Normal rate, regular rhythm.  Resp: Normal work of breathing.  Ext: No edema.  Neuro: No gross focal deficits.  Psych: Normal affect.   Assessment and Plan:  Patient has history of atrial fibrillation.  She was previously on Tikosyn .  Since retirement, patient has altered her sleep/wake cycle.  She awakes around noon each day and goes to bed around 3 in the morning.  Because of this schedule, she was only taking Tikosyn  once daily.  She was admitted and found to be in decompensated heart failure, requiring IV diuresis.  She is continue to require supplemental oxygen .  EP has been consulted for consideration of reloading Tikosyn .  We discussed extensively the importance of adhering to a regular schedule for Tikosyn  administration with both the patient and her daughter.  Patient voiced understanding.  When all of her other acute medical issues have been resolved, then we will reload her with Tikosyn  on a 1 PM/1 AM schedule.  Continue uninterrupted Xarelto  for now.  Fonda Kitty, MD 08/05/2024 10:32 PM

## 2024-08-05 NOTE — Progress Notes (Addendum)
 Progress Note  Patient Name: Carrie Haynes Date of Encounter: 08/05/2024 [facility] Cardiologist: Jerel Balding, MD    Interval Summary   Feeling OK this morning. Has not spent much time out of bed. Continues to feel a bit short of breath. Lower extremity swelling has improved   Vital Signs Vitals:   08/05/24 0300 08/05/24 0359 08/05/24 0750 08/05/24 0824  BP:  129/75 (!) 103/58 (!) 103/58  Pulse:  63 79 79  Resp:  18 18   Temp:  97.8 F (36.6 C) 97.6 F (36.4 C)   TempSrc:  Oral Oral   SpO2:  94% 93%   Weight: 93.4 kg     Height:        Intake/Output Summary (Last 24 hours) at 08/05/2024 1110 Last data filed at 08/05/2024 1107 Gross per 24 hour  Intake 240 ml  Output 2300 ml  Net -2060 ml      08/05/2024    3:00 AM 08/04/2024    4:17 AM 08/03/2024    4:32 AM  Last 3 Weights  Weight (lbs) 205 lb 14.6 oz 200 lb 13.4 oz 199 lb 1.3 oz  Weight (kg) 93.4 kg 91.1 kg 90.302 kg      Telemetry/ECG  Currently maintaining NSR with HR in the 80s  - Personally Reviewed  Physical Exam  GEN: No acute distress. Laying in the bed with head elevated  Neck: No JVD Cardiac:  RRR, no murmurs, rubs, or gallops.  Respiratory: Breathing unlabored. 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 Moderately reduced RV systolic function. Severe RV enlargement and severely elevated PA systolic pressure  - Suspect RV dysfunction and elevated PA pressure related to pulmonary fibrosis  - Earlier this admission, patient was on IV lasix . Attempted to switch to PO yesterday but patient had worsening SOB. Now back on IV lasix  60 mg Bid - Currently net -4.5 L since admission. Creatinine stable  - continue IV lasix   - Continue spironolactone 25 mg daily   PAF  - Patient had only been taking  her tikosyn  once per day prior to admission. Attempted to restart yesterday but she had worsening SOB  - Tikosyn  currently held. Not a candidate for amiodarone  with pulm fibrosis. Has been reportedly difficult to rate control in the past  - EP would not resume tikosyn  until she is ready for DC and stable from pulm/CHF standpoint - Continue xarelto - she has had some variation in her creatinine. Pharm Consulted to assist with xarelto  dosing  - Continue metoprolol  succinate 25 mg daily   Otherwise per primary  - Idiopathic pulmonary fibrosis  - CKD stage IIIa  - Type 2 DM  For questions or updates, please contact [facility] Please consult [email or web address] for contact info under       Signed, Rollo FABIENE Louder, PA-C   History and all data above reviewed.  I personally took the history today, performed the physical exam and formulated the assessment and plan.  I reviewed all relevant tests and studies. Patient examined.  I agree with the findings as above.  She had increased SOB trying to change to PO diuretic.  No acute distress.  She reported coughing up phlegm this AM. The patient exam reveals COR:RRR  ,  Lungs: Diffuse coarse crackles  ,  Abd: Positive bowel sounds, no rebound no guarding, Ext No edema   .  All available labs, radiology testing, previous records reviewed. Agree with documented assessment and plan.   Acute diastolic HF:  She is tolerating IV diuresis and will continue.     PAF: Reloading Tikosyn SABRA Lynwood Schilling  11:55 AM  08/05/2024

## 2024-08-05 NOTE — Plan of Care (Signed)
  Problem: Education: Goal: Ability to describe self-care measures that may prevent or decrease complications (Diabetes Survival Skills Education) will improve Outcome: Progressing Goal: Individualized Educational Video(s) Outcome: Progressing   Problem: Coping: Goal: Ability to adjust to condition or change in health will improve Outcome: Progressing   Problem: Fluid Volume: Goal: Ability to maintain a balanced intake and output will improve Outcome: Progressing   Problem: Health Behavior/Discharge Planning: Goal: Ability to identify and utilize available resources and services will improve Outcome: Progressing Goal: Ability to manage health-related needs will improve Outcome: Progressing   Problem: Metabolic: Goal: Ability to maintain appropriate glucose levels will improve Outcome: Progressing   Problem: Nutritional: Goal: Maintenance of adequate nutrition will improve Outcome: Progressing Goal: Progress toward achieving an optimal weight will improve Outcome: Progressing   Problem: Tissue Perfusion: Goal: Adequacy of tissue perfusion will improve Outcome: Progressing   Problem: Education: Goal: Knowledge of General Education information will improve Description: Including pain rating scale, medication(s)/side effects and non-pharmacologic comfort measures Outcome: Progressing   Problem: Health Behavior/Discharge Planning: Goal: Ability to manage health-related needs will improve Outcome: Progressing   Problem: Clinical Measurements: Goal: Ability to maintain clinical measurements within normal limits will improve Outcome: Progressing Goal: Will remain free from infection Outcome: Progressing Goal: Diagnostic test results will improve Outcome: Progressing Goal: Respiratory complications will improve Outcome: Progressing Goal: Cardiovascular complication will be avoided Outcome: Progressing   Problem: Activity: Goal: Risk for activity intolerance will  decrease Outcome: Progressing   Problem: Nutrition: Goal: Adequate nutrition will be maintained Outcome: Progressing   Problem: Coping: Goal: Level of anxiety will decrease Outcome: Progressing   Problem: Elimination: Goal: Will not experience complications related to bowel motility Outcome: Progressing Goal: Will not experience complications related to urinary retention Outcome: Progressing   Problem: Pain Managment: Goal: General experience of comfort will improve and/or be controlled Outcome: Progressing   Problem: Safety: Goal: Ability to remain free from injury will improve Outcome: Progressing   Problem: Skin Integrity: Goal: Risk for impaired skin integrity will decrease Outcome: Progressing   Problem: Education: Goal: Knowledge of disease or condition will improve Outcome: Progressing Goal: Understanding of medication regimen will improve Outcome: Progressing Goal: Individualized Educational Video(s) Outcome: Progressing   Problem: Activity: Goal: Ability to tolerate increased activity will improve Outcome: Progressing   Problem: Cardiac: Goal: Ability to achieve and maintain adequate cardiopulmonary perfusion will improve Outcome: Progressing   Problem: Health Behavior/Discharge Planning: Goal: Ability to safely manage health-related needs after discharge will improve Outcome: Progressing

## 2024-08-06 ENCOUNTER — Other Ambulatory Visit: Payer: Self-pay

## 2024-08-06 DIAGNOSIS — I10 Essential (primary) hypertension: Secondary | ICD-10-CM | POA: Diagnosis not present

## 2024-08-06 DIAGNOSIS — I4819 Other persistent atrial fibrillation: Secondary | ICD-10-CM | POA: Diagnosis not present

## 2024-08-06 DIAGNOSIS — J84112 Idiopathic pulmonary fibrosis: Secondary | ICD-10-CM | POA: Diagnosis not present

## 2024-08-06 DIAGNOSIS — I5033 Acute on chronic diastolic (congestive) heart failure: Secondary | ICD-10-CM | POA: Diagnosis not present

## 2024-08-06 LAB — GLUCOSE, CAPILLARY
Glucose-Capillary: 152 mg/dL — ABNORMAL HIGH (ref 70–99)
Glucose-Capillary: 171 mg/dL — ABNORMAL HIGH (ref 70–99)
Glucose-Capillary: 103 mg/dL — ABNORMAL HIGH (ref 70–99)
Glucose-Capillary: 178 mg/dL — ABNORMAL HIGH (ref 70–99)

## 2024-08-06 LAB — BASIC METABOLIC PANEL WITH GFR
Anion gap: 12 (ref 5–15)
BUN: 30 mg/dL — ABNORMAL HIGH (ref 8–23)
CO2: 29 mmol/L (ref 22–32)
Calcium: 8.8 mg/dL — ABNORMAL LOW (ref 8.9–10.3)
Chloride: 91 mmol/L — ABNORMAL LOW (ref 98–111)
Creatinine, Ser: 1.27 mg/dL — ABNORMAL HIGH (ref 0.44–1.00)
GFR, Estimated: 43 mL/min — ABNORMAL LOW (ref >60)
Glucose, Bld: 142 mg/dL — ABNORMAL HIGH (ref 70–99)
Potassium: 4.4 mmol/L (ref 3.5–5.1)
Sodium: 132 mmol/L — ABNORMAL LOW (ref 135–145)

## 2024-08-06 LAB — MAGNESIUM: Magnesium: 2.1 mg/dL (ref 1.7–2.4)

## 2024-08-06 MED ORDER — PROCHLORPERAZINE EDISYLATE 10 MG/2ML IJ SOLN: 5.0000 mg | Freq: Four times a day (QID) | INTRAMUSCULAR | Status: DC | PRN

## 2024-08-06 MED ORDER — SMOG ENEMA
300.0000 mL | Freq: Once | RECTAL | Status: DC
  Filled 2024-08-06: qty 960

## 2024-08-06 MED ORDER — POLYETHYLENE GLYCOL 3350 17 G PO PACK: 17.0000 g | PACK | Freq: Every day | ORAL | Status: DC | PRN

## 2024-08-06 MED ORDER — MELATONIN 5 MG PO TABS
5.0000 mg | ORAL_TABLET | Freq: Every evening | ORAL | Status: DC | PRN
  Filled 2024-08-06: qty 1

## 2024-08-06 MED ORDER — BISACODYL 10 MG RE SUPP
10.0000 mg | Freq: Once | RECTAL | Status: AC
  Administered 2024-08-06: 10 mg via RECTAL
  Filled 2024-08-06: qty 1

## 2024-08-06 MED ORDER — FUROSEMIDE 40 MG PO TABS
80.0000 mg | ORAL_TABLET | Freq: Two times a day (BID) | ORAL | Status: DC
  Administered 2024-08-06 – 2024-08-09 (×6): 80 mg via ORAL
  Filled 2024-08-06 (×6): qty 2

## 2024-08-06 NOTE — Progress Notes (Signed)
  Patient Name: MERVE HOTARD Date of Encounter: 08/06/2024  Primary Cardiologist: Jerel Balding, MD Electrophysiologist: None  Patient remains on IV Lasix  60mg  BID following increased dyspnea upon transition to PO. EP will continue to follow peripherally. Please re-engage when euvolemic, stable for d/c from CHF perspective.    Bonney Artist Pouch, PA-C  08/06/2024, 7:09 AM

## 2024-08-06 NOTE — Progress Notes (Addendum)
 Progress Note   Patient: Carrie Haynes FMW:[id number] DOB: 05-23-1941 DOA: 08/01/2024     3 DOS: the patient was seen and examined on 08/06/2024   Brief hospital course: Carrie Haynes was admitted to the hospital with the working diagnosis of heart failure exacerbation.   83 year old female with the past medical history of  ILD/pulmonary fibrosis, severe pulmonary hypertension, who presented with worsening dyspnea. Recent hospitalization for heart failure 07/1 to 04/22/24, she was diuresed and discharged on 40 mg of furosemide  and metoprolol  succinate, limited medical therapy due to hypotension.  Over the last 3 days prior to admission she noted rapidly progressive dyspnea and lower extremity edema. Positive weight gain 4 lbs. EMS was called and she was found in respiratory distress with RR at 40 and 02 saturation in the 80%, she was placed on higher flow of 02 per [facility] and transported to the ED.  On her initial physical examination her blood pressure was 105/49, RR 26 and 02 saturation 91%  Lungs with diffuse rales, increase work of breathing with minimal efforts, heart with S1 and S2 present and regular with no gallops or rubs, abdomen with no distention and positive lower extremity edema.   Na 134, K 4.1 Cl 96 bicarbonate 23, glucose 130 bun 33 cr 1,38  BNP 317  High sensitive troponin 12 and 12  Wbc 6,5 hgb 10.7 plt 212   Chest radiograph with cardiomegaly, bilateral hilar vascular congestion, bilateral peripheral interstitial infiltrates, more predominant at the lower lobes.   EKG 67 bpm, normal axis, qtc 608, sinus rhythm with low voltage, no significant ST segment or T wave changes.   10/20 responding well to diuresis  10/21 clinically responding to diuresis   Assessment and Plan: * Acute on chronic diastolic CHF (congestive heart failure) (HCC) Echocardiogram with preserved LV systolic function EF 55 to 60%, grade I diastolic dysfunction with impaired relaxation, D shaped  ventricular septum in systole and diastole, RV with moderate reduction in systolic function, RV with severe dilatation, RVSP 92.8 mmHg, RA with moderate dilatation, severe tricuspid valve regurgitation, trivial pericardial effusion.   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   Urine output is 2350  ml Systolic blood pressure 100 mmHg range.   Transitioned to oral furosemide  80 mg po bid   RAAS inhibition with spironolactone.  Continue with metoprolol  If blood pressure stable possible addition of low dose losartan .   Acute on chronic hypoxemic respiratory failure due to acute cardiogenic pulmonary edema, on ILD and pulmonary hypertension. 02 saturation today is 94% on 6L/min per Carrie Haynes Continue diuresis   Hypertension Continue blood pressure monitoring   A-fib (HCC) Paroxysmal atrial fibrillation  Continue sinus rhythm Continue dofetilide  with close monitoring of qtc interval.  Continue with metoprolol  Continue anticoagulation with rivaroxaban .   IPF (idiopathic pulmonary fibrosis) (HCC) Continue diuresis  No signs of acute flare.  Will continue 02 monitoring.  Continue with perifenidone  She declined bronchodilator therapy.   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/min (HCC) Hyponatremia (baseline serum cr 1,2)   Today renal function with serum cr at 1,27 with K at 4.4 and serum bicarbonate at 29  Na 132 and Mg 2.1   Continue diuresis and follow up renal function and electrolytes in am  Type 2 diabetes mellitus with hyperlipidemia (HCC) Continue glucose cover and monitoring with insulin  sliding scale.   Continue with statin therapy    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      Subjective: patient is feeling  better, but not yet back to baseline, no chest pain, continue to have dyspnea on exertion  Physical Exam: Vitals:   08/06/24 0434 08/06/24 0500 08/06/24 0745 08/06/24 1055  BP: 123/70  126/73 105/70  Pulse: 75  77 78  Resp: 20  17 18   Temp:  98.2 F (36.8 C)  98 F (36.7 C)   TempSrc: Oral     SpO2: 96%  94% 92%  Weight:  94.3 kg    Height:       Neurology awake and alert ENT with mild pallor Cardiovascular with S1 and S2 present and regular with no gallops or rubs Respiratory with bilateral rales with no wheezing or rhonchi, poor inspiratory effort and distant breath sounds Abdomen with no distention  Lower extremity edema +  Data Reviewed:    Family Communication: no family at the bedside   Disposition: Status is: Inpatient Remains inpatient appropriate because: diuresis   Planned Discharge Destination: Home     Author: Elidia Toribio Furnace, MD 08/06/2024 1:09 PM  For on call review [email or web address].

## 2024-08-06 NOTE — Progress Notes (Signed)
 Progress Note  Patient Name: Carrie Haynes Date of Encounter: 08/06/2024  Primary Cardiologist:   Jerel Balding, MD   Subjective   She thinks her breathing is slightly better.  No pain.    She was constipated. -  Inpatient Medications    Scheduled Meds:  atorvastatin   20 mg Oral QPM   cholecalciferol   5,000 Units Oral QPM   furosemide   60 mg Intravenous Q12H   insulin  aspart  0-15 Units Subcutaneous TID WC   insulin  aspart  0-5 Units Subcutaneous QHS   latanoprost  1 drop Both Eyes QHS   magnesium  oxide  400 mg Oral QPM   metoprolol  succinate  25 mg Oral Daily   Pirfenidone   801 mg Oral TID WC   polyethylene glycol  17 g Oral Daily   rivaroxaban   20 mg Oral Q supper   sodium chloride  flush  3 mL Intravenous Q12H   sodium chloride  flush  3 mL Intravenous Q12H   spironolactone  25 mg Oral Daily   Continuous Infusions:  PRN Meds: acetaminophen  **OR** acetaminophen , melatonin, polyethylene glycol, prochlorperazine, sodium chloride  flush   Vital Signs    Vitals:   08/05/24 2344 08/06/24 0434 08/06/24 0500 08/06/24 0745  BP: 108/66 123/70  126/73  Pulse: 76 75  77  Resp: 20 20  17   Temp: 98.2 F (36.8 C) 98.2 F (36.8 C)  98 F (36.7 C)  TempSrc: Oral Oral    SpO2: 90% 96%  94%  Weight:   94.3 kg   Height:        Intake/Output Summary (Last 24 hours) at 08/06/2024 0849 Last data filed at 08/05/2024 2300 Gross per 24 hour  Intake 180 ml  Output 2350 ml  Net -2170 ml   Filed Weights   08/04/24 0417 08/05/24 0300 08/06/24 0500  Weight: 91.1 kg 93.4 kg 94.3 kg    Telemetry    NSR, ST - Personally Reviewed  ECG    NA - Personally Reviewed  Physical Exam   GEN: No acute distress.   Neck: No  JVD Cardiac: RRR, no murmurs, rubs, or gallops.  Respiratory:     Decreased breath sounds with dry crackles.  GI: Soft, nontender, non-distended  MS: No  edema; No deformity. Neuro:  Nonfocal  Psych: Normal affect   Labs    Chemistry Recent Labs   Lab 08/04/24 1011 08/05/24 0225 08/06/24 0235  NA 131* 133* 132*  K 3.8 4.5 4.4  CL 93* 95* 91*  CO2 27 27 29   GLUCOSE 135* 117* 142*  BUN 22 25* 30*  CREATININE 1.20* 1.10* 1.27*  CALCIUM  8.4* 8.5* 8.8*  GFRNONAA 46* 51* 43*  ANIONGAP 11 11 12      Hematology Recent Labs  Lab 08/02/24 0016  WBC 6.5  RBC 3.51*  HGB 10.7*  HCT 33.2*  MCV 94.6  MCH 30.5  MCHC 32.2  RDW 15.9*  PLT 212    Cardiac EnzymesNo results for input(s): TROPONINI in the last 168 hours. No results for input(s): TROPIPOC in the last 168 hours.   BNP Recent Labs  Lab 08/02/24 0200  BNP 317.8*     DDimer No results for input(s): DDIMER in the last 168 hours.   Radiology    No results found.  Cardiac Studies   Echo:  1. Left ventricular ejection fraction, by estimation, is 55 to 60%. The  left ventricle has normal function. The left ventricle has no regional  wall motion abnormalities. Left ventricular diastolic  parameters are  consistent with Grade I diastolic  dysfunction (impaired relaxation).   2. D-shaped ventricular septum suggestive of RV pressure/volume overload.  Right ventricular systolic function is moderately reduced. The right  ventricular size is severely enlarged. There is severely elevated  pulmonary artery systolic pressure. The  estimated right ventricular systolic pressure is 92.8 mmHg.   3. Right atrial size was moderately dilated.   4. The mitral valve is normal in structure. Trivial mitral valve  regurgitation. No evidence of mitral stenosis. Moderate mitral annular  calcification.   5. The tricuspid valve is abnormal. Tricuspid valve regurgitation is  severe. Systolic flow reversal in the hepatic vein doppler pattern.   6. The aortic valve is tricuspid. There is moderate calcification of the  aortic valve. Aortic valve regurgitation is not visualized. Aortic valve  sclerosis/calcification is present, without any evidence of aortic  stenosis.   7. The  inferior vena cava is dilated in size with <50% respiratory  variability, suggesting right atrial pressure of 15 mmHg.   Patient Profile     83 y.o. female  with a hx of hypertension, hyperlipidemia, paroxysmal atrial fibrillation on Tikosyn , history of tachycardia chronic HFpEF with RV failure, type 2 diabetes, remote history of VTE, chronic hypoxic respiratory failure, interstitial lung disease on chronic 4 L oxygen , severe pulmonary hypertension, iron deficiency anemia  who is being seen 08/02/2024 for the evaluation of acute on chronic HFpEF at the request of Isaiah Lever, NP.   Assessment & Plan    Acute on Chronic HFpEF/RV dysfunction:  We did try to change to PO diuretic a couple of days ago but she had increased dyspnea.  Was back on IV yesterday.  Good UO and 2.2 l negative yesterday.    Creat is mildly elevated but stable.  I will change to PO Lasix  today.      CKD IIIA:  Creat is mildly elevated but stable.  Follow.    Atrial fib :  I discussed the case today with EP.  They do not want to start Tikosyn  until after she is on a stable dose of PO diuretic and then she will likely need a full load (3 days bid).    For questions or updates, please contact [facility] Please consult [email or web address] for contact info under Cardiology/STEMI.   Signed, Lynwood Schilling, MD  08/06/2024, 8:49 AM

## 2024-08-06 NOTE — Progress Notes (Signed)
 Pt states that the latanoprost eyedrops are from cataract surgery that she had years ago. She does not use eyedrops on a regular basis, & requests that they be removed from her med list.

## 2024-08-07 DIAGNOSIS — I4891 Unspecified atrial fibrillation: Secondary | ICD-10-CM | POA: Diagnosis not present

## 2024-08-07 DIAGNOSIS — I4819 Other persistent atrial fibrillation: Secondary | ICD-10-CM | POA: Diagnosis not present

## 2024-08-07 DIAGNOSIS — I5033 Acute on chronic diastolic (congestive) heart failure: Secondary | ICD-10-CM | POA: Diagnosis not present

## 2024-08-07 DIAGNOSIS — N1831 Chronic kidney disease, stage 3a: Secondary | ICD-10-CM | POA: Diagnosis not present

## 2024-08-07 LAB — BASIC METABOLIC PANEL WITH GFR
Anion gap: 11 (ref 5–15)
Anion gap: 11 (ref 5–15)
BUN: 32 mg/dL — ABNORMAL HIGH (ref 8–23)
BUN: 30 mg/dL — ABNORMAL HIGH (ref 8–23)
CO2: 29 mmol/L (ref 22–32)
CO2: 26 mmol/L (ref 22–32)
Calcium: 8.8 mg/dL — ABNORMAL LOW (ref 8.9–10.3)
Calcium: 8.3 mg/dL — ABNORMAL LOW (ref 8.9–10.3)
Chloride: 91 mmol/L — ABNORMAL LOW (ref 98–111)
Chloride: 89 mmol/L — ABNORMAL LOW (ref 98–111)
Creatinine, Ser: 1.27 mg/dL — ABNORMAL HIGH (ref 0.44–1.00)
Creatinine, Ser: 1.22 mg/dL — ABNORMAL HIGH (ref 0.44–1.00)
GFR, Estimated: 43 mL/min — ABNORMAL LOW (ref >60)
GFR, Estimated: 45 mL/min — ABNORMAL LOW (ref >60)
Glucose, Bld: 146 mg/dL — ABNORMAL HIGH (ref 70–99)
Glucose, Bld: 209 mg/dL — ABNORMAL HIGH (ref 70–99)
Potassium: 4.1 mmol/L (ref 3.5–5.1)
Potassium: 3.7 mmol/L (ref 3.5–5.1)
Sodium: 131 mmol/L — ABNORMAL LOW (ref 135–145)
Sodium: 126 mmol/L — ABNORMAL LOW (ref 135–145)

## 2024-08-07 LAB — MAGNESIUM
Magnesium: 2.2 mg/dL (ref 1.7–2.4)
Magnesium: 2 mg/dL (ref 1.7–2.4)

## 2024-08-07 LAB — GLUCOSE, CAPILLARY
Glucose-Capillary: 118 mg/dL — ABNORMAL HIGH (ref 70–99)
Glucose-Capillary: 119 mg/dL — ABNORMAL HIGH (ref 70–99)
Glucose-Capillary: 236 mg/dL — ABNORMAL HIGH (ref 70–99)
Glucose-Capillary: 122 mg/dL — ABNORMAL HIGH (ref 70–99)

## 2024-08-07 MED ORDER — LACTULOSE 10 GM/15ML PO SOLN: 20.0000 g | Freq: Two times a day (BID) | ORAL | Status: DC | PRN

## 2024-08-07 MED ORDER — HYDROCORTISONE 1 % EX CREA
1.0000 | TOPICAL_CREAM | Freq: Three times a day (TID) | CUTANEOUS | Status: DC | PRN
  Administered 2024-08-07: 1 via TOPICAL
  Filled 2024-08-07: qty 28

## 2024-08-07 MED ORDER — GUAIFENESIN-DM 100-10 MG/5ML PO SYRP
5.0000 mL | ORAL_SOLUTION | ORAL | Status: DC | PRN
  Administered 2024-08-07 – 2024-08-10 (×4): 5 mL via ORAL
  Filled 2024-08-07 (×4): qty 5

## 2024-08-07 MED ORDER — DOFETILIDE 125 MCG PO CAPS
125.0000 ug | ORAL_CAPSULE | Freq: Two times a day (BID) | ORAL | Status: DC
  Administered 2024-08-07 – 2024-08-10 (×7): 125 ug via ORAL
  Filled 2024-08-07 (×7): qty 1

## 2024-08-07 MED ORDER — POTASSIUM CHLORIDE CRYS ER 20 MEQ PO TBCR
40.0000 meq | EXTENDED_RELEASE_TABLET | Freq: Once | ORAL | Status: AC
  Administered 2024-08-07: 40 meq via ORAL
  Filled 2024-08-07: qty 2

## 2024-08-07 NOTE — Plan of Care (Signed)
 Pt is going to depart to [facility] for her tikosyn  dose adjustment, has gotten care here for the last few days, but [facility] usually works with adjust tikosyn  dose, so she will be transferred.

## 2024-08-07 NOTE — Progress Notes (Signed)
 PROGRESS NOTE    Carrie Haynes  FMW:[id number] DOB: Dec 31, 1940 DOA: 08/01/2024 PCP: Teresa Channel, MD   82/F with IPF, chronic respiratory failure on 4 L O2, severe pulmonary hypertension, diastolic CHF, RV failure, paroxysmal A-fib admitted with acute on chronic CHF -10/22, EP following, Tikosyn  reload  Subjective: -Complains of constipation  Assessment and Plan:  Acute on chronic diastolic CHF, RV failure Pulmonary hypertension -Last echo with EF 55-60%, grade 1 DD, moderately reduced RV, severely elevated PA systolic pressures, RVSP 92.8, severe TR -Improving with diuresis, 7 L negative, cards following now switched to oral Lasix , continue Aldactone and metoprolol  - Increase activity  Acute on chronic hypoxemic respiratory failure due to acute cardiogenic pulmonary edema, on ILD and pulmonary hypertension. -Now back to 4 L  Hypertension Continue blood pressure monitoring   Paroxysmal A-fib (HCC) -on Tikosyn  previously but only taking once daily, EP following, restarting on Tikosyn  this afternoon -Continue metoprolol  and Xarelto   IPF (idiopathic pulmonary fibrosis) (HCC) Continue diuresis  No signs of acute flare.  Continue with perifenidone  She declined bronchodilator therapy.   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/min (HCC) Hyponatremia (baseline serum cr 1,2)  - Stable, continue oral Lasix  at this time  Type 2 diabetes mellitus with hyperlipidemia (HCC) -Stable, SSI  Constipation Add lactulose, at discharge add Senokot to home meds  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   DVT prophylaxis: xarelto  Code Status: Full Code Family Communication: None present Disposition Plan:   Consultants:    Procedures:   Antimicrobials:    Objective: Vitals:   08/06/24 2110 08/07/24 0045 08/07/24 0500 08/07/24 0732  BP: 116/65 (!) 95/58  111/60  Pulse: 73 67  76  Resp: 17 16  20   Temp: 98.2 F (36.8 C) 97.9 F (36.6 C)  97.6 F (36.4 C)  TempSrc: Oral Oral   Oral  SpO2: 97% 97%  96%  Weight:   92 kg   Height:        Intake/Output Summary (Last 24 hours) at 08/07/2024 1055 Last data filed at 08/07/2024 1000 Gross per 24 hour  Intake 723 ml  Output 1400 ml  Net -677 ml   Filed Weights   08/05/24 0300 08/06/24 0500 08/07/24 0500  Weight: 93.4 kg 94.3 kg 92 kg    Examination:  General exam: Appears calm and comfortable AO x 3, chronically ill-appearing Respiratory system: Decreased breath sounds at the bases, positive rales Cardiovascular system: S1 & S2 heard, RRR.  Abd: nondistended, soft and nontender.Normal bowel sounds heard. Central nervous system: Alert and oriented. No focal neurological deficits. Extremities: no edema Skin: No rashes Psychiatry:  Mood & affect appropriate.     Data Reviewed:   CBC: Recent Labs  Lab 08/02/24 0016  WBC 6.5  HGB 10.7*  HCT 33.2*  MCV 94.6  PLT 212   Basic Metabolic Panel: Recent Labs  Lab 08/04/24 0332 08/04/24 1011 08/05/24 0225 08/06/24 0235 08/07/24 0243  NA 132* 131* 133* 132* 131*  K 4.0 3.8 4.5 4.4 4.1  CL 94* 93* 95* 91* 91*  CO2 28 27 27 29 29   GLUCOSE 124* 135* 117* 142* 146*  BUN 25* 22 25* 30* 32*  CREATININE 1.28* 1.20* 1.10* 1.27* 1.27*  CALCIUM  8.5* 8.4* 8.5* 8.8* 8.8*  MG 1.8 1.9 2.1 2.1 2.2   GFR: Estimated Creatinine Clearance: 35.8 mL/min (A) (by C-G formula based on SCr of 1.27 mg/dL (H)). Liver Function Tests: No results for input(s): AST, ALT, ALKPHOS, BILITOT, PROT, ALBUMIN in the  last 168 hours. No results for input(s): LIPASE, AMYLASE in the last 168 hours. No results for input(s): AMMONIA in the last 168 hours. Coagulation Profile: No results for input(s): INR, PROTIME in the last 168 hours. Cardiac Enzymes: No results for input(s): CKTOTAL, CKMB, CKMBINDEX, TROPONINI in the last 168 hours. BNP (last 3 results) Recent Labs    11/21/23 1607  PROBNP 323.0*   HbA1C: No results for input(s): HGBA1C in the  last 72 hours. CBG: Recent Labs  Lab 08/06/24 0644 08/06/24 1054 08/06/24 1516 08/06/24 2111 08/07/24 0631  GLUCAP 152* 171* 103* 178* 118*   Lipid Profile: No results for input(s): CHOL, HDL, LDLCALC, TRIG, CHOLHDL, LDLDIRECT in the last 72 hours. Thyroid  Function Tests: No results for input(s): TSH, T4TOTAL, FREET4, T3FREE, THYROIDAB in the last 72 hours. Anemia Panel: No results for input(s): VITAMINB12, FOLATE, FERRITIN, TIBC, IRON, RETICCTPCT in the last 72 hours. Urine analysis:    Component Value Date/Time   COLORURINE STRAW (A) 08/05/2020 2318   APPEARANCEUR CLEAR 08/05/2020 2318   LABSPEC 1.006 08/05/2020 2318   PHURINE 6.0 08/05/2020 2318   GLUCOSEU NEGATIVE 08/05/2020 2318   HGBUR NEGATIVE 08/05/2020 2318   BILIRUBINUR NEGATIVE 08/05/2020 2318   KETONESUR NEGATIVE 08/05/2020 2318   PROTEINUR NEGATIVE 08/05/2020 2318   NITRITE NEGATIVE 08/05/2020 2318   LEUKOCYTESUR NEGATIVE 08/05/2020 2318   Sepsis Labs: @LABRCNTIP (procalcitonin:4,lacticidven:4)  )No results found for this or any previous visit (from the past 240 hours).   Radiology Studies: No results found.   Scheduled Meds:  atorvastatin   20 mg Oral QPM   cholecalciferol   5,000 Units Oral QPM   dofetilide   125 mcg Oral BID   furosemide   80 mg Oral BID   insulin  aspart  0-15 Units Subcutaneous TID WC   insulin  aspart  0-5 Units Subcutaneous QHS   latanoprost  1 drop Both Eyes QHS   magnesium  oxide  400 mg Oral QPM   metoprolol  succinate  25 mg Oral Daily   Pirfenidone   801 mg Oral TID WC   polyethylene glycol  17 g Oral Daily   rivaroxaban   20 mg Oral Q supper   sodium chloride  flush  3 mL Intravenous Q12H   sodium chloride  flush  3 mL Intravenous Q12H   spironolactone  25 mg Oral Daily   Continuous Infusions:   LOS: 4 days    Time spent:    Sigurd Pac, MD [facility]   08/07/2024, 10:55 AM

## 2024-08-07 NOTE — Final Progress Note (Addendum)
 Pharmacy: Dofetilide  (Tikosyn ) - Initial Consult Assessment and Electrolyte Replacement  Pharmacy consulted to assist in monitoring and replacing electrolytes in this 83 y.o. female admitted on 08/01/2024 undergoing dofetilide  re-initiation.  Assessment:  Patient Exclusion Criteria: If any screening criteria checked as Yes, then  patient  should NOT receive dofetilide  until criteria item is corrected.  If "Yes" please indicate correction plan.  YES  NO Patient  Exclusion Criteria Correction Plan/Comments   [x]   []   Baseline QTc interval is greater than or equal to 440 msec. IF above YES box checked dofetilide  contraindicated unless patient has ICD; then may proceed if QTc 500-550 msec or with known ventricular conduction abnormalities may proceed with QTc 550-600 msec. QTc 445 Continue Tikosyn  with close monitoring   []   [x]   Patient is known or suspected to have a digoxin  level greater than 2 ng/ml: No results found for: DIGOXIN      []   [x]   Creatinine clearance less than 20 ml/min (calculated using Cockcroft-Gault, actual body weight and serum creatinine): Estimated Creatinine Clearance: 35.8 mL/min (A) (by C-G formula based on SCr of 1.27 mg/dL (H)).     []   [x]  Patient has received drugs known to prolong the QT intervals within the last 48 hour (examples: phenothiazines, tricyclics or tetracyclic antidepressants, macrolides, 1st generation H-1 antihistamines (especially diphenhydramine), fluoroquinolones, azoles, ondansetron , metoclopramide, promethazine).   Updated information on QT prolonging agents is available to be searched on the following database:QT prolonging agents -If SSRI or antihistamine needed, preferred options are sertraline and loratadine respectively     []   [x]  Patient received a dose of a thiazide diuretic in the last 48 hours [including hydrochlorothiazide (Oretic) alone or in any combination including triamterene (Dyazide, Maxzide)].    []   [x]   Patient received a medication known to increase dofetilide  plasma concentrations prior to initial dofetilide  dose:  Trimethoprim (Primsol, Proloprim) in the last 36 hours Verapamil  (Calan , Verelan ) in the last 36 hours or a sustained release dose in the last 72 hours Megestrol (Megace) in the last 5 days  Cimetidine (Tagamet) in the last 6 hours Ketoconazole (Nizoral) in the last 24 hours Itraconazole (Sporanox) in the last 48 hours  Prochlorperazine (Compazine) in the last 36 hours     []   [x]   Patient is known to have a history of torsades de pointes; congenital or acquired long QT syndromes.    []   [x]   Patient has received a Class 1 and Class 3 antiarrhythmic with less than 2 half-lives since last dose. (Disopyramide, Quinidine, Procainamide, Lidocaine , Mexiletine, Flecainide, Propafenone, Sotalol, Dronedarone)    []   [x]   Patient has received amiodarone  therapy in the past 3 months or amiodarone  level is greater than 0.3 ng/ml.    Labs:    Component Value Date/Time   K 4.1 08/07/2024 0243   MG 2.2 08/07/2024 0243     Plan: Select One Calculated CrCl  Dose q12h  []  > 60 ml/min 500 mcg  [x]  40-60 ml/min 250 mcg  []  20-40 ml/min 125 mcg   [x]   Physician selected initial dose within range recommended for patients level of renal function - will monitor for response.  []   Physician selected initial dose outside of range recommended for patients level of renal function - will discuss if the dose should be altered at this time.   Patient has been appropriately anticoagulated with Xarelto .  Potassium: K >/= 4: Appropriate to initiate Tikosyn , no replacement needed    Magnesium : Mg >2: Appropriate to initiate Tikosyn ,  no replacement needed     Thank you for allowing pharmacy to participate in this patient's care   Prentice Poisson, PharmD Clinical Pharmacist **Pharmacist phone directory can now be found on [email or web address] ([email or web address]).  Listed under [facility].  Addendum -Repeat  BMET showed K= 3.7  Plan -Will give KDur 40meq x1  Prentice Poisson, PharmD Clinical Pharmacist **Pharmacist phone directory can now be found on [email or web address] (PW [email or web address]).  Listed under [facility].

## 2024-08-07 NOTE — Progress Notes (Signed)
 Mobility Specialist Progress Note:    08/07/24 1147  Mobility  Activity Refused and notified nurse if applicable   Pt refused mobility despite max encouragement. Will f/u as able.    Lavanda Pollack Mobility Specialist  Please contact via [employer] or  Rehab Office [phone]

## 2024-08-07 NOTE — Progress Notes (Addendum)
 Patient Name: Carrie Haynes Date of Encounter: 08/07/2024  Primary Cardiologist: Jerel Balding, MD Electrophysiologist: None  Interval Summary   The patient feels that breathing is improved today. Reports significant urine output yesterday with IV lasix . No focal complaints this morning.  Vital Signs    Vitals:   08/06/24 2110 08/07/24 0045 08/07/24 0500 08/07/24 0732  BP: 116/65 (!) 95/58  111/60  Pulse: 73 67  76  Resp: 17 16  20   Temp: 98.2 F (36.8 C) 97.9 F (36.6 C)  97.6 F (36.4 C)  TempSrc: Oral Oral  Oral  SpO2: 97% 97%  96%  Weight:   92 kg   Height:        Intake/Output Summary (Last 24 hours) at 08/07/2024 0900 Last data filed at 08/07/2024 9177 Gross per 24 hour  Intake 843 ml  Output 1700 ml  Net -857 ml   Filed Weights   08/05/24 0300 08/06/24 0500 08/07/24 0500  Weight: 93.4 kg 94.3 kg 92 kg    Physical Exam    GEN- NAD, Alert and oriented  Lungs- bibasilar crackles Cardiac- Regular rate and rhythm, no murmurs, rubs or gallops GI- soft, NT, ND, + BS Extremities- no clubbing or cyanosis. No edema  Telemetry    Sinus rhythm with intermittent monomorphic PVCs (personally reviewed)  Hospital Course    KEEANA PIERATT is a 83 y.o. female with history of HTN, HLD, DM, chronic hypoxic respiratory failure, PAH, IPF, chronic HFpEF, atrial fibrillation. Patient admitted with acute on chronic CHF/acute hypoxic respiratory failure.  Assessment & Plan    Paroxysmal atrial fibrillation Secondary hypercoagulable state PTA patient on Xarelto  and Tikosyn  PTA (though only taking Tikosyn  once daily). Given inappropriate dosing of Tikosyn , EP engaged to assist with management. Over the weekend, Dr. Waddell attempted to restart Tikosyn  125mcg q12hr. However, patient developed worsening dyspnea and required aggressive diuresing and re-load paused. Today patient's respiratory status much improved and she is now back on oral lasix  with stable creatinine.   Start Tikosyn  125mcg q12 hours (dosing to be timed 1300/0100 given patient's sleep schedule). Needs ECG 2 hours after each dose. Reviewed this with nursing staff. 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 Diuretics per cardiology.  CKD IIIA Stable creatinine, 1.27 today.       For questions or updates, please contact [facility] Please consult [email or web address] for contact info under     Signed, Artist Pouch, PA-C  08/07/2024, 9:00 AM    I have seen, examined the patient, and reviewed the above assessment and plan.    Interval:  No acute overnight events. Patient reports feeling relatively well. No new or acute complaints.   Transitioned off IV lasix  to oral.   General: Well developed, in no acute distress.  Neck: No JVD.  Cardiac: Normal rate, regular rhythm.  Resp: Normal work of breathing.  Ext: No edema.  Neuro: No gross focal deficits.  Psych: Normal affect.   Cr: 1.22 K: 3.7  EKG: QT  Assessment: Patient has history of atrial fibrillation.  She was previously on Tikosyn .  Since retirement, patient has altered her sleep/wake cycle.  She awakes around noon each day and goes to bed around 3 in the morning.  Because of this schedule, she was only taking Tikosyn  once daily.  She was admitted and found to be in decompensated heart failure, requiring IV diuresis.  She has now been transitioned  to oral diuretics. We discussed extensively the importance of adhering to a regular schedule for Tikosyn  administration with both the patient and her daughter.  Patient voiced understanding.  We will reload her with Tikosyn  on a 1 PM/1 AM schedule.    Problem List: Persistent atrial fibrillation Secondary hypercoagulable state due to atrial fibrillation High risk medication use  Plan:  - Start Tikosyn  125 mcg with ECG 2 hours after each dose. Will  give doses at 1pm / 1am. - Continue routine monitoring of kidney function and electrolytes, replete as necessary.  - Continue Xarelto .   Fonda Kitty, MD 08/07/2024 3:52 PM

## 2024-08-07 NOTE — Progress Notes (Signed)
 CCMD called twice and confirmed twice, patient is on 6E [id number] tele box.

## 2024-08-07 NOTE — Progress Notes (Signed)
 Afternoon EKG reviewed     Shows remains in NSR with stable QTc at ~456 ms.  Continue  Tikosyn  125 mcg BID (1300/0100).   Potassium3.7 (10/22 1004) (replaced) Magnesium   2.0 (10/22 1004) Creatinine, ser  1.22* (10/22 1004)  Discharge timing to be determined by both Tikosyn  loading and HF management.   Artist Pouch, PA-C  08/07/2024 4:13 PM

## 2024-08-07 NOTE — Progress Notes (Signed)
 Progress Note  Patient Name: Carrie Haynes Date of Encounter: 08/07/2024  Primary Cardiologist:   Jerel Balding, MD   Subjective   She is likely breathing at baseline.  No new pain.  Still with cough.  No fevers.    Inpatient Medications    Scheduled Meds:  atorvastatin   20 mg Oral QPM   cholecalciferol   5,000 Units Oral QPM   furosemide   80 mg Oral BID   insulin  aspart  0-15 Units Subcutaneous TID WC   insulin  aspart  0-5 Units Subcutaneous QHS   latanoprost  1 drop Both Eyes QHS   magnesium  oxide  400 mg Oral QPM   metoprolol  succinate  25 mg Oral Daily   Pirfenidone   801 mg Oral TID WC   polyethylene glycol  17 g Oral Daily   rivaroxaban   20 mg Oral Q supper   sodium chloride  flush  3 mL Intravenous Q12H   sodium chloride  flush  3 mL Intravenous Q12H   spironolactone  25 mg Oral Daily   Continuous Infusions:  PRN Meds: acetaminophen  **OR** acetaminophen , melatonin, polyethylene glycol, prochlorperazine, sodium chloride  flush   Vital Signs    Vitals:   08/06/24 2110 08/07/24 0045 08/07/24 0500 08/07/24 0732  BP: 116/65 (!) 95/58  111/60  Pulse: 73 67  76  Resp: 17 16  20   Temp: 98.2 F (36.8 C) 97.9 F (36.6 C)  97.6 F (36.4 C)  TempSrc: Oral Oral  Oral  SpO2: 97% 97%  96%  Weight:   92 kg   Height:        Intake/Output Summary (Last 24 hours) at 08/07/2024 0905 Last data filed at 08/07/2024 9177 Gross per 24 hour  Intake 843 ml  Output 1700 ml  Net -857 ml   Filed Weights   08/05/24 0300 08/06/24 0500 08/07/24 0500  Weight: 93.4 kg 94.3 kg 92 kg    Telemetry    NSR with PACs - Personally Reviewed  ECG    NA - Personally Reviewed  Physical Exam   GEN: No  acute distress.   Neck: No  JVD Cardiac: Regular RR, no murmurs, rubs, or gallops.  Respiratory:    Decreased breath sounds with coarse crackles GI: Soft, nontender, non-distended, normal bowel sounds  MS:  No edema; No deformity. Neuro:   Nonfocal  Psych: Oriented and  appropriate    Labs    Chemistry Recent Labs  Lab 08/05/24 0225 08/06/24 0235 08/07/24 0243  NA 133* 132* 131*  K 4.5 4.4 4.1  CL 95* 91* 91*  CO2 27 29 29   GLUCOSE 117* 142* 146*  BUN 25* 30* 32*  CREATININE 1.10* 1.27* 1.27*  CALCIUM  8.5* 8.8* 8.8*  GFRNONAA 51* 43* 43*  ANIONGAP 11 12 11      Hematology Recent Labs  Lab 08/02/24 0016  WBC 6.5  RBC 3.51*  HGB 10.7*  HCT 33.2*  MCV 94.6  MCH 30.5  MCHC 32.2  RDW 15.9*  PLT 212    Cardiac EnzymesNo results for input(s): TROPONINI in the last 168 hours. No results for input(s): TROPIPOC in the last 168 hours.   BNP Recent Labs  Lab 08/02/24 0200  BNP 317.8*     DDimer No results for input(s): DDIMER in the last 168 hours.   Radiology    No results found.  Cardiac Studies   Echo:  1. Left ventricular ejection fraction, by estimation, is 55 to 60%. The  left ventricle has normal function. The left ventricle  has no regional  wall motion abnormalities. Left ventricular diastolic parameters are  consistent with Grade I diastolic  dysfunction (impaired relaxation).   2. D-shaped ventricular septum suggestive of RV pressure/volume overload.  Right ventricular systolic function is moderately reduced. The right  ventricular size is severely enlarged. There is severely elevated  pulmonary artery systolic pressure. The  estimated right ventricular systolic pressure is 92.8 mmHg.   3. Right atrial size was moderately dilated.   4. The mitral valve is normal in structure. Trivial mitral valve  regurgitation. No evidence of mitral stenosis. Moderate mitral annular  calcification.   5. The tricuspid valve is abnormal. Tricuspid valve regurgitation is  severe. Systolic flow reversal in the hepatic vein doppler pattern.   6. The aortic valve is tricuspid. There is moderate calcification of the  aortic valve. Aortic valve regurgitation is not visualized. Aortic valve  sclerosis/calcification is present,  without any evidence of aortic  stenosis.   7. The inferior vena cava is dilated in size with <50% respiratory  variability, suggesting right atrial pressure of 15 mmHg.   Patient Profile     83 y.o. female  with a hx of hypertension, hyperlipidemia, paroxysmal atrial fibrillation on Tikosyn , history of tachycardia chronic HFpEF with RV failure, type 2 diabetes, remote history of VTE, chronic hypoxic respiratory failure, interstitial lung disease on chronic 4 L oxygen , severe pulmonary hypertension, iron deficiency anemia  who is being seen 08/02/2024 for the evaluation of acute on chronic HFpEF at the request of Isaiah Lever, NP.   Assessment & Plan    Acute on Chronic HFpEF/RV dysfunction:  Changed to PO diuretic yesterday.  Breathing at baseline.  Renal function tolerating current diuresis.   Net negative 7 liters.  No change in therapy.    CKD IIIA:  Creat is stable.   Follow closely.   Atrial fib :  Plan to restart Tikosyn  today.  Ordered and will complete load by Saturday morning.   For questions or updates, please contact [facility] Please consult [email or web address] for contact info under Cardiology/STEMI.   Signed, Lynwood Schilling, MD  08/07/2024, 9:05 AM

## 2024-08-08 ENCOUNTER — Other Ambulatory Visit (HOSPITAL_COMMUNITY): Payer: Self-pay

## 2024-08-08 DIAGNOSIS — I4819 Other persistent atrial fibrillation: Secondary | ICD-10-CM | POA: Diagnosis not present

## 2024-08-08 DIAGNOSIS — Z79899 Other long term (current) drug therapy: Secondary | ICD-10-CM | POA: Diagnosis not present

## 2024-08-08 DIAGNOSIS — I5033 Acute on chronic diastolic (congestive) heart failure: Secondary | ICD-10-CM | POA: Diagnosis not present

## 2024-08-08 LAB — BASIC METABOLIC PANEL WITH GFR
Anion gap: 12 (ref 5–15)
BUN: 31 mg/dL — ABNORMAL HIGH (ref 8–23)
CO2: 29 mmol/L (ref 22–32)
Calcium: 8.8 mg/dL — ABNORMAL LOW (ref 8.9–10.3)
Chloride: 89 mmol/L — ABNORMAL LOW (ref 98–111)
Creatinine, Ser: 1.12 mg/dL — ABNORMAL HIGH (ref 0.44–1.00)
GFR, Estimated: 50 mL/min — ABNORMAL LOW (ref >60)
Glucose, Bld: 131 mg/dL — ABNORMAL HIGH (ref 70–99)
Potassium: 4.4 mmol/L (ref 3.5–5.1)
Sodium: 130 mmol/L — ABNORMAL LOW (ref 135–145)

## 2024-08-08 LAB — MAGNESIUM: Magnesium: 2.1 mg/dL (ref 1.7–2.4)

## 2024-08-08 LAB — GLUCOSE, CAPILLARY
Glucose-Capillary: 219 mg/dL — ABNORMAL HIGH (ref 70–99)
Glucose-Capillary: 179 mg/dL — ABNORMAL HIGH (ref 70–99)
Glucose-Capillary: 216 mg/dL — ABNORMAL HIGH (ref 70–99)
Glucose-Capillary: 90 mg/dL (ref 70–99)
Glucose-Capillary: 152 mg/dL — ABNORMAL HIGH (ref 70–99)

## 2024-08-08 NOTE — Progress Notes (Addendum)
 Electrophysiology Rounding Note  Patient Name: Carrie Haynes Date of Encounter: 08/08/2024  Primary Cardiologist: Jerel Balding, MD  Electrophysiologist: None    Subjective   Pt remains in NSR on Tikosyn  125 mcg BID   QTc from EKG last pm shows stable QTc at .  The patient is doing well today.  At this time, the patient denies chest pain, shortness of breath, or any new concerns.  Inpatient Medications    Scheduled Meds:  atorvastatin   20 mg Oral QPM   cholecalciferol   5,000 Units Oral QPM   dofetilide   125 mcg Oral BID   furosemide   80 mg Oral BID   insulin  aspart  0-15 Units Subcutaneous TID WC   insulin  aspart  0-5 Units Subcutaneous QHS   latanoprost  1 drop Both Eyes QHS   magnesium  oxide  400 mg Oral QPM   metoprolol  succinate  25 mg Oral Daily   Pirfenidone   801 mg Oral TID WC   polyethylene glycol  17 g Oral Daily   rivaroxaban   20 mg Oral Q supper   sodium chloride  flush  3 mL Intravenous Q12H   sodium chloride  flush  3 mL Intravenous Q12H   spironolactone  25 mg Oral Daily   Continuous Infusions:  PRN Meds: acetaminophen  **OR** acetaminophen , guaiFENesin -dextromethorphan , hydrocortisone  cream, lactulose, melatonin, sodium chloride  flush   Vital Signs    Vitals:   08/07/24 1057 08/07/24 1637 08/07/24 2055 08/07/24 2318  BP: (!) 101/56 90/60 108/61 99/76  Pulse: 79 70 66 67  Resp: 20 20 18 20   Temp: 97.6 F (36.4 C) 98.6 F (37 C) 98.5 F (36.9 C) 98.6 F (37 C)  TempSrc: Oral Oral Oral Oral  SpO2: 97% 92% 96% 97%  Weight:      Height:        Intake/Output Summary (Last 24 hours) at 08/08/2024 0641 Last data filed at 08/07/2024 2322 Gross per 24 hour  Intake 483 ml  Output 1500 ml  Net -1017 ml   Filed Weights   08/05/24 0300 08/06/24 0500 08/07/24 0500  Weight: 93.4 kg 94.3 kg 92 kg    Physical Exam    GEN- NAD, A&O x 3. Normal affect.  Lungs- CTAB, Normal effort.  Heart- Regular rate and rhythm. No M/G/R GI- Soft, NT,  ND Extremities- No clubbing, cyanosis, or edema Skin- no rash or lesion  Labs    CBC No results for input(s): WBC, NEUTROABS, HGB, HCT, MCV, PLT in the last 72 hours. Basic Metabolic Panel Recent Labs    89/77/74 1004 08/08/24 0430  NA 126* 130*  K 3.7 4.4  CL 89* 89*  CO2 26 29  GLUCOSE 209* 131*  BUN 30* 31*  CREATININE 1.22* 1.12*  CALCIUM  8.3* 8.8*  MG 2.0 2.1    Telemetry    Sinus rhythm, HR 60s-80s. Isolated PVCs, no NSVT or sustained arrhythmias  (personally reviewed)  Patient Profile     Carrie Haynes is a 83 y.o. female with a past medical history significant for persistent atrial fibrillation.  They were admitted for tikosyn  load.   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 Diuretics per cardiology.  CKD IIIa Creatinine stable, 1.12.  Discharge timing to be determined by both Tikosyn  loading and HF management.      For questions or updates, please contact [facility] Please consult [email or web address] for contact info under Cardiology/STEMI.  Signed, Artist Pouch, PA-C  08/08/2024, 6:41 AM   I have seen, examined the patient, and reviewed the above assessment and plan.    Interval: Moved to 6e for Tikosyn  loading. No acute overnight events. Patient reports feeling relatively well. No new or acute complaints.   General: Well developed, in no acute distress.  Neck: No JVD.  Cardiac: Normal rate, regular rhythm.  Resp: Normal work of breathing.  Ext: No edema.  Neuro: No gross focal deficits.  Psych: Normal affect.   Cr: 1.12 K: 4.4  EKG:  Assessment: Patient has history of atrial  fibrillation.  She was previously on Tikosyn .  Since retirement, patient has altered her sleep/wake cycle.  She awakes around noon each day and goes to bed around 3 in the morning.  Because of this schedule, she was only taking Tikosyn  once daily.  She was admitted and found to be in decompensated heart failure, requiring IV diuresis.  She has now been transitioned to oral diuretics. We discussed extensively the importance of adhering to a regular schedule for Tikosyn  administration with both the patient and her daughter.  Patient voiced understanding.  We will reload her with Tikosyn  on a 1 PM/1 AM schedule.    Problem List: Persistent atrial fibrillation Secondary hypercoagulable state due to atrial fibrillation High risk medication use  Plan:  - Continue Tikosyn  125 mcg with ECG 2 hours after each dose.  - Continue routine monitoring of kidney function and electrolytes, replete as necessary.  - Continue Xarelto .  Fonda Kitty, MD 08/08/2024 11:37 PM

## 2024-08-08 NOTE — Progress Notes (Signed)
 Per pt & pt's family at bedside, latanoprost eye drops were ordered temporarily for post cataract surgery care & pt finished the course a few months ago. Pt requesting this medication removed from orders

## 2024-08-08 NOTE — TOC Benefit Eligibility Note (Signed)
 Transition of Care [facility]) Benefit Eligibility Note    Patient Details  Name: Carrie Haynes MRN: [id number] Date of Birth: 1941-01-01   Medication/Dose: TIKOSYN  125 MCG  Covered?:  (NON-FORMULARY)     Prescription Coverage Preferred Pharmacy: [facility], [facility] [place], [place], [facility]  Spoke with Person/Company/Phone Number:: TONI  B. @ [employer] # [phone]     Prior Approval:  [phone] OPT-5)  Deductible: Met (OUT-OF-POCKET-MET)  Additional Notes: DOFRTILIDE 125 MCG: COVER: NON-PREFERRED , TIER-4 DRUG , PRIOR APPROVAL- NO    Glade Cuff Phone Number: 08/08/2024, 3:30 PM

## 2024-08-08 NOTE — Progress Notes (Signed)
 Mobility Specialist Progress Note;   08/08/24 0912  Mobility  Activity Ambulated with assistance (from bathroom)  Level of Assistance Contact guard assist, steadying assist  Assistive Device Front wheel walker  Distance Ambulated (ft) 10 ft  Activity Response Tolerated well  Mobility Referral Yes  Mobility visit 1 Mobility  Mobility Specialist Start Time (ACUTE ONLY) 0912  Mobility Specialist Stop Time (ACUTE ONLY) 0920  Mobility Specialist Time Calculation (min) (ACUTE ONLY) 8 min   Pt requesting assistance from BR. Required MinG assistance for short ambulation in room. VSS on 6LO2. Deferred any further ambulation or sitting in chair. Pt returned to EoB and left with all needs met. Daughter present.   Lauraine Erm Mobility Specialist Please contact via [employer] or [employer] [phone]

## 2024-08-08 NOTE — Progress Notes (Signed)
 Pharmacy: Dofetilide  (Tikosyn ) - Follow Up Assessment and Electrolyte Replacement  Pharmacy consulted to assist in monitoring and replacing electrolytes in this 83 y.o. female admitted on 08/01/2024 undergoing dofetilide  re-initiation. First dofetilide  dose: 10/22 pm  Labs:    Component Value Date/Time   K 4.4 08/08/2024 0430   MG 2.1 08/08/2024 0430     Plan: Potassium: K >/= 4: No additional supplementation needed  Magnesium : Mg > 2: No additional supplementation needed - on mag ox 400mg  every day    Olam Chalk Pharm.D. CPP, BCPS Clinical Pharmacist [phone] 08/08/2024 7:28 AM

## 2024-08-08 NOTE — Progress Notes (Signed)
 Afternoon EKG reviewed.     Shows remains in NSR with stable QTc at 445 ms.  Continue  Tikosyn  125 mcg BID.   Potassium4.4 (10/23 0430) Magnesium   2.1 (10/23 0430) Creatinine, ser  1.12* (10/23 0430)  Discharge timing to be determined by both Tikosyn  loading and HF management.  Artist Pouch, PA-C  08/08/2024 3:11 PM

## 2024-08-08 NOTE — Progress Notes (Signed)
 PROGRESS NOTE    Carrie Haynes  FMW:[id number] DOB: 03-30-1941 DOA: 08/01/2024 PCP: Teresa Channel, MD   82/F with IPF, chronic respiratory failure on 4 L O2, severe pulmonary hypertension, diastolic CHF, RV failure, paroxysmal A-fib admitted with acute on chronic CHF -10/22, EP following, Tikosyn  reload  Subjective: - Feels okay, ambulated in the room yesterday, had a bowel movement  Assessment and Plan:  Acute on chronic diastolic CHF, RV failure Pulmonary hypertension -Last echo with EF 55-60%, grade 1 DD, moderately reduced RV, severely elevated PA systolic pressures, RVSP 92.8, severe TR -Improving with diuresis, 7 L negative, cards following now switched to oral Lasix , continue Aldactone and metoprolol  - Increase activity  Acute on chronic hypoxemic respiratory failure due to acute cardiogenic pulmonary edema, on ILD and pulmonary hypertension. - Improved, now back to 4 L  Paroxysmal A-fib (HCC) -on Tikosyn  previously but only taking once daily, EP following, restarted on Tikosyn  10/22 afternoon -Continue metoprolol  and Xarelto   IPF (idiopathic pulmonary fibrosis) (HCC) Continue diuresis  No signs of acute flare.  Continue with perifenidone  She declined bronchodilator therapy.   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/min (HCC) Hyponatremia (baseline serum cr 1,2)  - Stable, continue oral Lasix  at this time  Type 2 diabetes mellitus with hyperlipidemia (HCC) -Stable, SSI  Constipation Continue lactulose, at discharge add Senokot to home meds  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   DVT prophylaxis: xarelto  Code Status: Full Code Family Communication: None present Disposition Plan:   Consultants:    Procedures:   Antimicrobials:    Objective: Vitals:   08/07/24 2055 08/07/24 2318 08/08/24 0330 08/08/24 0754  BP: 108/61 99/76 117/67 (!) 101/57  Pulse: 66 67 66 81  Resp: 18 20 16 16   Temp: 98.5 F (36.9 C) 98.6 F (37 C) 98.5 F (36.9 C) 97.6 F (36.4  C)  TempSrc: Oral Oral Oral Oral  SpO2: 96% 97% 96% 94%  Weight:   88.8 kg   Height:        Intake/Output Summary (Last 24 hours) at 08/08/2024 0932 Last data filed at 08/07/2024 2322 Gross per 24 hour  Intake 483 ml  Output 1500 ml  Net -1017 ml   Filed Weights   08/06/24 0500 08/07/24 0500 08/08/24 0330  Weight: 94.3 kg 92 kg 88.8 kg    Examination:  General exam: Appears calm and comfortable AO x 3, chronically ill-appearing Respiratory system: Decreased breath sounds at the bases Cardiovascular system: S1 & S2 heard, RRR.  Abd: nondistended, soft and nontender.Normal bowel sounds heard. Central nervous system: Alert and oriented. No focal neurological deficits. Extremities: Trace edema Skin: No rashes Psychiatry:  Mood & affect appropriate.     Data Reviewed:   CBC: Recent Labs  Lab 08/02/24 0016  WBC 6.5  HGB 10.7*  HCT 33.2*  MCV 94.6  PLT 212   Basic Metabolic Panel: Recent Labs  Lab 08/05/24 0225 08/06/24 0235 08/07/24 0243 08/07/24 1004 08/08/24 0430  NA 133* 132* 131* 126* 130*  K 4.5 4.4 4.1 3.7 4.4  CL 95* 91* 91* 89* 89*  CO2 27 29 29 26 29   GLUCOSE 117* 142* 146* 209* 131*  BUN 25* 30* 32* 30* 31*  CREATININE 1.10* 1.27* 1.27* 1.22* 1.12*  CALCIUM  8.5* 8.8* 8.8* 8.3* 8.8*  MG 2.1 2.1 2.2 2.0 2.1   GFR: Estimated Creatinine Clearance: 39.7 mL/min (A) (by C-G formula based on SCr of 1.12 mg/dL (H)). Liver Function Tests: No results for input(s): AST, ALT, ALKPHOS,  BILITOT, PROT, ALBUMIN in the last 168 hours. No results for input(s): LIPASE, AMYLASE in the last 168 hours. No results for input(s): AMMONIA in the last 168 hours. Coagulation Profile: No results for input(s): INR, PROTIME in the last 168 hours. Cardiac Enzymes: No results for input(s): CKTOTAL, CKMB, CKMBINDEX, TROPONINI in the last 168 hours. BNP (last 3 results) Recent Labs    11/21/23 1607  PROBNP 323.0*   HbA1C: No results for  input(s): HGBA1C in the last 72 hours. CBG: Recent Labs  Lab 08/07/24 0631 08/07/24 1107 08/07/24 1635 08/07/24 2109 08/08/24 0758  GLUCAP 118* 119* 236* 122* 219*   Lipid Profile: No results for input(s): CHOL, HDL, LDLCALC, TRIG, CHOLHDL, LDLDIRECT in the last 72 hours. Thyroid  Function Tests: No results for input(s): TSH, T4TOTAL, FREET4, T3FREE, THYROIDAB in the last 72 hours. Anemia Panel: No results for input(s): VITAMINB12, FOLATE, FERRITIN, TIBC, IRON, RETICCTPCT in the last 72 hours. Urine analysis:    Component Value Date/Time   COLORURINE STRAW (A) 08/05/2020 2318   APPEARANCEUR CLEAR 08/05/2020 2318   LABSPEC 1.006 08/05/2020 2318   PHURINE 6.0 08/05/2020 2318   GLUCOSEU NEGATIVE 08/05/2020 2318   HGBUR NEGATIVE 08/05/2020 2318   BILIRUBINUR NEGATIVE 08/05/2020 2318   KETONESUR NEGATIVE 08/05/2020 2318   PROTEINUR NEGATIVE 08/05/2020 2318   NITRITE NEGATIVE 08/05/2020 2318   LEUKOCYTESUR NEGATIVE 08/05/2020 2318   Sepsis Labs: @LABRCNTIP (procalcitonin:4,lacticidven:4)  )No results found for this or any previous visit (from the past 240 hours).   Radiology Studies: No results found.   Scheduled Meds:  atorvastatin   20 mg Oral QPM   cholecalciferol   5,000 Units Oral QPM   dofetilide   125 mcg Oral BID   furosemide   80 mg Oral BID   insulin  aspart  0-15 Units Subcutaneous TID WC   insulin  aspart  0-5 Units Subcutaneous QHS   latanoprost  1 drop Both Eyes QHS   magnesium  oxide  400 mg Oral QPM   metoprolol  succinate  25 mg Oral Daily   Pirfenidone   801 mg Oral TID WC   polyethylene glycol  17 g Oral Daily   rivaroxaban   20 mg Oral Q supper   sodium chloride  flush  3 mL Intravenous Q12H   sodium chloride  flush  3 mL Intravenous Q12H   spironolactone  25 mg Oral Daily   Continuous Infusions:   LOS: 5 days    Time spent:    Sigurd Pac, MD [facility]   08/08/2024, 9:32 AM

## 2024-08-08 NOTE — Progress Notes (Signed)
 Patient reports home dose oxygen  is 4-4.5L.

## 2024-08-09 ENCOUNTER — Telehealth (HOSPITAL_COMMUNITY): Payer: Self-pay | Admitting: Pharmacy Technician

## 2024-08-09 ENCOUNTER — Other Ambulatory Visit (HOSPITAL_COMMUNITY): Payer: Self-pay

## 2024-08-09 DIAGNOSIS — Z79899 Other long term (current) drug therapy: Secondary | ICD-10-CM | POA: Diagnosis not present

## 2024-08-09 DIAGNOSIS — I5033 Acute on chronic diastolic (congestive) heart failure: Secondary | ICD-10-CM | POA: Diagnosis not present

## 2024-08-09 DIAGNOSIS — I480 Paroxysmal atrial fibrillation: Secondary | ICD-10-CM | POA: Diagnosis not present

## 2024-08-09 LAB — BASIC METABOLIC PANEL WITH GFR
Anion gap: 12 (ref 5–15)
BUN: 31 mg/dL — ABNORMAL HIGH (ref 8–23)
CO2: 28 mmol/L (ref 22–32)
Calcium: 8.6 mg/dL — ABNORMAL LOW (ref 8.9–10.3)
Chloride: 89 mmol/L — ABNORMAL LOW (ref 98–111)
Creatinine, Ser: 1.18 mg/dL — ABNORMAL HIGH (ref 0.44–1.00)
GFR, Estimated: 47 mL/min — ABNORMAL LOW (ref >60)
Glucose, Bld: 181 mg/dL — ABNORMAL HIGH (ref 70–99)
Potassium: 3.9 mmol/L (ref 3.5–5.1)
Sodium: 129 mmol/L — ABNORMAL LOW (ref 135–145)

## 2024-08-09 LAB — GLUCOSE, CAPILLARY
Glucose-Capillary: 151 mg/dL — ABNORMAL HIGH (ref 70–99)
Glucose-Capillary: 195 mg/dL — ABNORMAL HIGH (ref 70–99)
Glucose-Capillary: 94 mg/dL (ref 70–99)
Glucose-Capillary: 217 mg/dL — ABNORMAL HIGH (ref 70–99)

## 2024-08-09 LAB — MAGNESIUM: Magnesium: 2 mg/dL (ref 1.7–2.4)

## 2024-08-09 MED ORDER — POTASSIUM CHLORIDE CRYS ER 20 MEQ PO TBCR
20.0000 meq | EXTENDED_RELEASE_TABLET | Freq: Every day | ORAL | Status: DC
  Administered 2024-08-09 – 2024-08-10 (×2): 20 meq via ORAL
  Filled 2024-08-09 (×2): qty 1

## 2024-08-09 MED ORDER — FUROSEMIDE 40 MG PO TABS
80.0000 mg | ORAL_TABLET | Freq: Every day | ORAL | Status: DC
Start: 2024-08-10 — End: 2024-08-10
  Administered 2024-08-10: 80 mg via ORAL
  Filled 2024-08-09: qty 2

## 2024-08-09 NOTE — Progress Notes (Signed)
 Pharmacy: Dofetilide  (Tikosyn ) - Follow Up Assessment and Electrolyte Replacement  Pharmacy consulted to assist in monitoring and replacing electrolytes in this 83 y.o. female admitted on 08/01/2024 undergoing dofetilide  re-initiation. First dofetilide  dose: 10/22 pm  Labs:    Component Value Date/Time   K 3.9 08/09/2024 0358   MG 2.0 08/09/2024 0358     Plan: Potassium: K fell 4.4>3.9 with po furosemide  yesterday will start KCL 20mEq daily   Magnesium : Mg > 2: No additional supplementation needed - currently on Mag Ox 400mg  daily - continue    [employer] Pharm.D. CPP, BCPS Clinical Pharmacist [phone] 08/09/2024 7:33 AM

## 2024-08-09 NOTE — Telephone Encounter (Signed)
 Patient Product/process development scientist completed.    The patient is insured through [facility]. Patient has Medicare and is not eligible for a copay card, but may be able to apply for patient assistance or Medicare RX Payment Plan (Patient Must reach out to their plan, if eligible for payment plan), if available.    Ran test claim for dofetilide  500 mcg capsules and the current 30 day co-pay is $0.00.   This test claim was processed through [facility]- copay amounts may vary at other pharmacies due to pharmacy/plan contracts, or as the patient moves through the different stages of their insurance plan.     Carrie Haynes, CPHT Pharmacy Technician Patient Advocate Specialist Lead [facility] Patient Advocate Team Direct Number: [phone]  Fax: [phone]

## 2024-08-09 NOTE — Progress Notes (Addendum)
 Progress Note  Patient Name: Carrie Haynes Date of Encounter: 08/09/2024  Primary Cardiologist:   Jerel Balding, MD   Subjective   No chest pain.  Breathing is back to baseline  Inpatient Medications    Scheduled Meds:  atorvastatin   20 mg Oral QPM   cholecalciferol   5,000 Units Oral QPM   dofetilide   125 mcg Oral BID   furosemide   80 mg Oral BID   insulin  aspart  0-15 Units Subcutaneous TID WC   insulin  aspart  0-5 Units Subcutaneous QHS   latanoprost  1 drop Both Eyes QHS   magnesium  oxide  400 mg Oral QPM   metoprolol  succinate  25 mg Oral Daily   Pirfenidone   801 mg Oral TID WC   polyethylene glycol  17 g Oral Daily   potassium chloride   20 mEq Oral Daily   rivaroxaban   20 mg Oral Q supper   sodium chloride  flush  3 mL Intravenous Q12H   sodium chloride  flush  3 mL Intravenous Q12H   spironolactone  25 mg Oral Daily   Continuous Infusions:  PRN Meds: acetaminophen  **OR** acetaminophen , guaiFENesin -dextromethorphan , hydrocortisone  cream, lactulose, melatonin, sodium chloride  flush   Vital Signs    Vitals:   08/08/24 1931 08/09/24 0100 08/09/24 0313 08/09/24 0728  BP: 112/70 119/68 105/62 96/61  Pulse: 86 79 75 72  Resp: 20 16 20 16   Temp: 97.8 F (36.6 C) (!) 97.5 F (36.4 C) 98.3 F (36.8 C) 99 F (37.2 C)  TempSrc: Oral Oral Oral Oral  SpO2: 92% 93% 94% 96%  Weight:   87 kg   Height:        Intake/Output Summary (Last 24 hours) at 08/09/2024 1018 Last data filed at 08/09/2024 0315 Gross per 24 hour  Intake 240 ml  Output 1200 ml  Net -960 ml   Filed Weights   08/08/24 0330 08/08/24 1300 08/09/24 0313  Weight: 88.8 kg 86.7 kg 87 kg    Telemetry    NSR - Personally Reviewed  ECG    NA - Personally Reviewed  Physical Exam   GEN: No  acute distress.   Neck: No  JVD Cardiac: RRR, no murmurs, rubs, or gallops.  Respiratory:     Decreased breath sounds with fine crackles GI: Soft, nontender, non-distended, normal bowel sounds   MS:  Trace leg edema; No deformity. Neuro:   Nonfocal  Psych: Oriented and appropriate     Labs    Chemistry Recent Labs  Lab 08/07/24 1004 08/08/24 0430 08/09/24 0358  NA 126* 130* 129*  K 3.7 4.4 3.9  CL 89* 89* 89*  CO2 26 29 28   GLUCOSE 209* 131* 181*  BUN 30* 31* 31*  CREATININE 1.22* 1.12* 1.18*  CALCIUM  8.3* 8.8* 8.6*  GFRNONAA 45* 50* 47*  ANIONGAP 11 12 12      Hematology No results for input(s): WBC, RBC, HGB, HCT, MCV, MCH, MCHC, RDW, PLT in the last 168 hours.   Cardiac EnzymesNo results for input(s): TROPONINI in the last 168 hours. No results for input(s): TROPIPOC in the last 168 hours.   BNP No results for input(s): BNP, PROBNP in the last 168 hours.    DDimer No results for input(s): DDIMER in the last 168 hours.   Radiology    No results found.  Cardiac Studies   Echo:  1. Left ventricular ejection fraction, by estimation, is 55 to 60%. The  left ventricle has normal function. The left ventricle has no regional  wall motion abnormalities. Left ventricular diastolic parameters are  consistent with Grade I diastolic  dysfunction (impaired relaxation).   2. D-shaped ventricular septum suggestive of RV pressure/volume overload.  Right ventricular systolic function is moderately reduced. The right  ventricular size is severely enlarged. There is severely elevated  pulmonary artery systolic pressure. The  estimated right ventricular systolic pressure is 92.8 mmHg.   3. Right atrial size was moderately dilated.   4. The mitral valve is normal in structure. Trivial mitral valve  regurgitation. No evidence of mitral stenosis. Moderate mitral annular  calcification.   5. The tricuspid valve is abnormal. Tricuspid valve regurgitation is  severe. Systolic flow reversal in the hepatic vein doppler pattern.   6. The aortic valve is tricuspid. There is moderate calcification of the  aortic valve. Aortic valve regurgitation  is not visualized. Aortic valve  sclerosis/calcification is present, without any evidence of aortic  stenosis.   7. The inferior vena cava is dilated in size with <50% respiratory  variability, suggesting right atrial pressure of 15 mmHg.   Patient Profile     83 y.o. female  with a hx of hypertension, hyperlipidemia, paroxysmal atrial fibrillation on Tikosyn , history of tachycardia chronic HFpEF with RV failure, type 2 diabetes, remote history of VTE, chronic hypoxic respiratory failure, interstitial lung disease on chronic 4 L oxygen , severe pulmonary hypertension, iron deficiency anemia  who is being seen 08/02/2024 for the evaluation of acute on chronic HFpEF at the request of Isaiah Lever, NP.   Assessment & Plan    Acute on Chronic HFpEF/RV dysfunction:      She seems to be euvolemic.   I am going to reduce to 80 mg of Lasix  daily.  Needs repeat BMET and TOC follow up next week.  We will arrange.    Other meds as on MAR.    CKD IIIA:  Creat is stable but Na is low.    Atrial fib :    On Tikosyn  per EP.  Home tomorrow after the load is complete.    For questions or updates, please contact [facility] Please consult [email or web address] for contact info under Cardiology/STEMI.   Signed, Lynwood Schilling, MD  08/09/2024, 10:18 AM

## 2024-08-09 NOTE — TOC Initial Note (Addendum)
 Transition of Care [facility]) - Initial/Assessment Note    Patient Details  Name: Carrie Haynes MRN: [id number] Date of Birth: 05-18-1941  [facility] CM/SW Contact:    Sudie Erminio Deems, RN Phone Number: 08/09/2024, 8:36 AM  Clinical Narrative:  Patient presented for shortness of breath- EP consulting for Tikosyn  re-load. Benefits check submitted. and co pay is zero cost. Patient uses [facility]. PTA patient was from home with significant other and has support of daughter. Patient has DME rolling walker, oxygen , and shower chair. ICM will continue to follow for additional needs as the patient progresses.        Expected Discharge Plan: Home/Self Care Barriers to Discharge: No Barriers Identified   Patient Goals and CMS Choice Patient states their goals for this hospitalization and ongoing recovery are:: plans to return home once stable.          Expected Discharge Plan and Services In-house Referral: NA Discharge Planning Services: CM Consult Post Acute Care Choice: NA Living arrangements for the past 2 months: Single Family Home                   DME Agency: NA       HH Arranged: NA          Prior Living Arrangements/Services Living arrangements for the past 2 months: Single Family Home Lives with:: Significant Other Patient language and need for interpreter reviewed:: Yes Do you feel safe going back to the place where you live?: Yes      Need for Family Participation in Patient Care: No (Comment) Care giver support system in place?: No (comment) Current home services: DME (rolling walker, oxygen  and shower chair.) Criminal Activity/Legal Involvement Pertinent to Current Situation/Hospitalization: No - Comment as needed  Activities of Daily Living   ADL Screening (condition at time of admission) Independently performs ADLs?: No Does the patient have a NEW difficulty with bathing/dressing/toileting/self-feeding that is  expected to last >3 days?: Yes (Initiates electronic notice to provider for possible OT consult) Does the patient have a NEW difficulty with getting in/out of bed, walking, or climbing stairs that is expected to last >3 days?: Yes (Initiates electronic notice to provider for possible PT consult) Does the patient have a NEW difficulty with communication that is expected to last >3 days?: No Is the patient deaf or have difficulty hearing?: No Does the patient have difficulty seeing, even when wearing glasses/contacts?: No Does the patient have difficulty concentrating, remembering, or making decisions?: No  Permission Sought/Granted Permission sought to share information with : Case Manager, Family Supports                Emotional Assessment Appearance:: Appears stated age Attitude/Demeanor/Rapport: Engaged Affect (typically observed): Appropriate Orientation: : Oriented to Self, Oriented to Place Alcohol / Substance Use: Not Applicable Psych Involvement: No (comment)  Admission diagnosis:  Acute on chronic diastolic congestive heart failure (HCC) [I50.33] Acute on chronic heart failure with preserved ejection fraction (HFpEF) (HCC) [I50.33] Acute on chronic heart failure with preserved ejection fraction (HCC) [I50.33] Patient Active Problem List   Diagnosis Date Noted   High risk medication use 08/08/2024   PAF (paroxysmal atrial fibrillation) (HCC) 08/05/2024   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/min (HCC) 08/04/2024   Acute on chronic diastolic CHF (congestive heart failure) (HCC) 08/03/2024   Acute on chronic respiratory failure with hypoxia (HCC) 08/02/2024   Cor pulmonale, chronic (HCC) 05/14/2024   PAH (pulmonary artery hypertension) (HCC) 05/14/2024  RVF (right ventricular failure) (HCC) 04/18/2024   Acute on chronic heart failure with preserved ejection fraction (HFpEF) (HCC) 04/16/2024   Pulmonary hypertension (HCC) 04/16/2024   Hyponatremia 04/16/2024   IPF (idiopathic  pulmonary fibrosis) (HCC) 09/16/2021   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   IDA (iron deficiency anemia) 11/12/2020   Chronic respiratory failure with hypoxia (HCC) 09/09/2020   Hypertension    Hyperlipemia    Type 2 diabetes mellitus with hyperlipidemia (HCC)    Symptomatic anemia 07/30/2020   Secondary hypercoagulable state 09/16/2019   Upper airway cough syndrome 07/13/2017   Obesity, class 2 07/13/2017   DCM (dilated cardiomyopathy) (HCC)    Atrial fibrillation with RVR (HCC)    Asystole (HCC)    Hypotension    Acute on chronic combined systolic and diastolic CHF (congestive heart failure) (HCC)    DOE (dyspnea on exertion) 05/12/2017   A-fib (HCC) 05/10/2017   PCP:  Teresa Channel, MD Pharmacy:   [id number] [place], [place] - [address] [place] [address] Phone: [phone] Fax: [phone]  [facility] - [place], [place] - [address] [place] [address] Phone: [phone] Fax: [phone]  [facility] - [place], [place] - [address] [facility] [address] [place] [address] Phone: [phone] Fax: [phone]     Social Drivers of Health (SDOH) Social History: SDOH Screenings   Food Insecurity: No Food Insecurity (08/02/2024)  Housing: High Risk (08/02/2024)  Transportation Needs: No Transportation Needs (08/02/2024)  Utilities: Not At Risk (08/02/2024)  Social Connections: Moderately Isolated (08/02/2024)  Tobacco Use: Low Risk  (08/02/2024)   SDOH Interventions:     Readmission Risk Interventions    08/09/2024    8:36 AM 04/17/2024    4:26 PM  Readmission Risk Prevention Plan  Transportation Screening Complete Complete  PCP or Specialist Appt within 5-7 Days  Complete  Home Care Screening  Complete  Medication Review (RN CM)  Complete  HRI or Home Care Consult Complete   Social Work Consult for Recovery Care Planning/Counseling Complete    Palliative Care Screening Not Applicable   Medication Review Oceanographer) Referral to Pharmacy

## 2024-08-09 NOTE — Plan of Care (Signed)

## 2024-08-09 NOTE — Progress Notes (Addendum)
 Electrophysiology Rounding Note  Patient Name: Carrie Haynes Date of Encounter: 08/09/2024  Primary Cardiologist: Jerel Balding, MD  Electrophysiologist: None    Subjective   Pt remains in NSR on Tikosyn  125 mcg BID   QTc from EKG last pm shows borderline QTc at .  The patient is doing well today.  At this time, the patient denies chest pain, shortness of breath, or any new concerns.  Inpatient Medications    Scheduled Meds:  atorvastatin   20 mg Oral QPM   cholecalciferol   5,000 Units Oral QPM   dofetilide   125 mcg Oral BID   furosemide   80 mg Oral BID   insulin  aspart  0-15 Units Subcutaneous TID WC   insulin  aspart  0-5 Units Subcutaneous QHS   latanoprost  1 drop Both Eyes QHS   magnesium  oxide  400 mg Oral QPM   metoprolol  succinate  25 mg Oral Daily   Pirfenidone   801 mg Oral TID WC   polyethylene glycol  17 g Oral Daily   rivaroxaban   20 mg Oral Q supper   sodium chloride  flush  3 mL Intravenous Q12H   sodium chloride  flush  3 mL Intravenous Q12H   spironolactone  25 mg Oral Daily   Continuous Infusions:  PRN Meds: acetaminophen  **OR** acetaminophen , guaiFENesin -dextromethorphan , hydrocortisone  cream, lactulose, melatonin, sodium chloride  flush   Vital Signs    Vitals:   08/08/24 1714 08/08/24 1931 08/09/24 0100 08/09/24 0313  BP:  112/70 119/68 105/62  Pulse:  86 79 75  Resp: 16 20 16 20   Temp:  97.8 F (36.6 C) (!) 97.5 F (36.4 C) 98.3 F (36.8 C)  TempSrc: Oral Oral Oral Oral  SpO2:  92% 93% 94%  Weight:    87 kg  Height:        Intake/Output Summary (Last 24 hours) at 08/09/2024 0648 Last data filed at 08/09/2024 0315 Gross per 24 hour  Intake --  Output 1200 ml  Net -1200 ml   Filed Weights   08/08/24 0330 08/08/24 1300 08/09/24 0313  Weight: 88.8 kg 86.7 kg 87 kg    Physical Exam    GEN- NAD, A&O x 3. Normal affect.  Lungs- diffuse crackles with ILD Heart- Regular rate and rhythm. No M/G/R GI- Soft, NT,  ND Extremities- No clubbing, cyanosis, or edema Skin- no rash or lesion  Labs    CBC No results for input(s): WBC, NEUTROABS, HGB, HCT, MCV, PLT in the last 72 hours. Basic Metabolic Panel Recent Labs    89/76/74 0430 08/09/24 0358  NA 130* 129*  K 4.4 3.9  CL 89* 89*  CO2 29 28  GLUCOSE 131* 181*  BUN 31* 31*  CREATININE 1.12* 1.18*  CALCIUM  8.8* 8.6*  MG 2.1 2.0    Telemetry    Sinus rhythm with rates 60s-80s (personally reviewed)  Patient Profile     Carrie Haynes is a 83 y.o. female with a past medical history significant for persistent atrial fibrillation.  They were admitted for tikosyn  load and HF management.  Assessment & Plan    Persistent atrial fibrillation Pt remains in NSR on Tikosyn  125 mcg BID  Continue Tikosyn  loading, dose 5 ( ) at 1300. Continue Xarelto  Creatinine, ser  1.18* (10/24 0358) Magnesium   2.0 (10/24 0358) Potassium3.9 (10/24 0358) Supplement K  Acute on chronic HFpEF with RV dysfunction Acute hypoxic respiratory failure Appears euvolemic today, stable/home O2 requirements. Diuretics per cardiology.   CKD IIIa Creatinine stable, 1.18.  For questions or updates, please contact [facility] Please consult [email or web address] for contact info under Cardiology/STEMI.  Signed, Artist Pouch, PA-C  08/09/2024, 6:48 AM   I have seen, examined the patient, and reviewed the above assessment and plan.    HPI: No acute overnight events. Patient reports feeling relatively well. No new or acute complaints.   General: Well developed, in no acute distress.  Neck: No JVD.  Cardiac: Normal rate, regular rhythm.  Resp: Normal work of breathing.  Ext: No edema.  Neuro: No gross focal deficits.  Psych: Normal affect.   Cr: 1.18 K: 3.9  EKG: Qtc  Assessment:   Patient has history of atrial fibrillation.  She was previously on Tikosyn .  Since retirement, patient has altered her sleep/wake cycle.  She awakes  around noon each day and goes to bed around 3 in the morning.  Because of this schedule, she was only taking Tikosyn  once daily.  She was admitted and found to be in decompensated heart failure, requiring IV diuresis.  She has now been transitioned to oral diuretics. We discussed extensively the importance of adhering to a regular schedule for Tikosyn  administration with both the patient and her daughter.  Patient voiced understanding.  We will reload her with Tikosyn  on a 1 PM/1 AM schedule.     Problem List: Paroxysmal atrial fibrillation Secondary hypercoagulable state due to atrial fibrillation High risk medication use   Plan:  - Continue Tikosyn  125 mcg with ECG 2 hours after each dose.  - Continue routine monitoring of kidney function and electrolytes, replete as necessary.  - Continue Xarelto .  Fonda Kitty, MD 08/09/2024 12:28 PM

## 2024-08-09 NOTE — Progress Notes (Signed)
 Mobility Specialist Progress Note;   08/09/24 1014  Mobility  Activity Ambulated with assistance (from bathroom)  Level of Assistance Contact guard assist, steadying assist  Assistive Device Front wheel walker  Distance Ambulated (ft) 10 ft  Activity Response Tolerated well  Mobility Referral Yes  Mobility visit 1 Mobility  Mobility Specialist Start Time (ACUTE ONLY) 1014  Mobility Specialist Stop Time (ACUTE ONLY) 1023  Mobility Specialist Time Calculation (min) (ACUTE ONLY) 9 min   Received pt in bathroom. Was able to ambulate from BR back to bed with MinG assistance. Ambulated on 7LO2, SPO2 desat to 86% however recovered to 96% within 1 min seated rest break. Pt deferred further mobility d/t fatigue. Pt left sitting on EoB talking w/ MD. Daughter present.   Lauraine Erm Mobility Specialist Please contact via SecureChat or [employer] [phone]

## 2024-08-09 NOTE — Progress Notes (Addendum)
 Afternoon EKG reviewed     Shows remains in NSR with stable QTc at 470 ms.  Continue  Tikosyn  125 mcg q12hr time 0100 and 1300.   Prior to discharge, patient will need Tikosyn  Rx 125mcg BID sent to [facility] pharmacy. Potassium has been relatively stable this admission with Spironolactone. May not need supplementation at d/c. Please review prior to discharge with pharmacist, final recs per MD.   Reiterated importance of compliance with 12 hour timing and reminded patient that she is NOT to take prior 500mcg capsules at home.   1 week afib clinic follow up and 1 month EP APP follow up arranged.   Potassium3.9 (10/24 0358) Magnesium   2.0 (10/24 0358) Creatinine, ser  1.18* (10/24 0358)   Artist Pouch, PA-C  08/09/2024 3:02 PM

## 2024-08-09 NOTE — Progress Notes (Signed)
 PROGRESS NOTE    Carrie Haynes  FMW:[id number] DOB: [birth year] DOA: 08/01/2024 PCP: Teresa Channel, MD   82/F with IPF, chronic respiratory failure on 4 L O2, severe pulmonary hypertension, diastolic CHF, RV failure, paroxysmal A-fib admitted with acute on chronic CHF -10/22, EP following, Tikosyn  reload  Subjective: - Feels fair, no events overnight, breathing is at baseline  Assessment and Plan:  Acute on chronic diastolic CHF, RV failure Pulmonary hypertension -Last echo with EF 55-60%, grade 1 DD, moderately reduced RV, severely elevated PA systolic pressures, RVSP 92.8, severe TR -Improving with diuresis, 7 L negative, cards following now switched to oral Lasix , continue Aldactone and metoprolol  - Increase activity  Acute on chronic hypoxemic respiratory failure due to acute cardiogenic pulmonary edema, on ILD and pulmonary hypertension. - Improved, now back to 4 L  Paroxysmal A-fib (HCC) -on Tikosyn  previously but only taking once daily, EP following, restarted on Tikosyn  10/22 afternoon -Continue metoprolol  and Xarelto   IPF (idiopathic pulmonary fibrosis) (HCC) Continue diuresis  No signs of acute flare.  Continue with perifenidone  She declined bronchodilator therapy.   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/min (HCC) Hyponatremia (baseline serum cr 1,2)  - Stable, continue oral Lasix  at this time  Type 2 diabetes mellitus with hyperlipidemia (HCC) -Stable, SSI  Constipation Continue lactulose, at discharge add Senokot to home meds  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   DVT prophylaxis: xarelto  Code Status: Full Code Family Communication: None present Disposition Plan:   Consultants:    Procedures:   Antimicrobials:    Objective: Vitals:   08/08/24 1931 08/09/24 0100 08/09/24 0313 08/09/24 0728  BP: 112/70 119/68 105/62 96/61  Pulse: 86 79 75 72  Resp: 20 16 20 16   Temp: 97.8 F (36.6 C) (!) 97.5 F (36.4 C) 98.3 F (36.8 C) 99 F (37.2 C)   TempSrc: Oral Oral Oral Oral  SpO2: 92% 93% 94% 96%  Weight:   87 kg   Height:        Intake/Output Summary (Last 24 hours) at 08/09/2024 1226 Last data filed at 08/09/2024 0315 Gross per 24 hour  Intake 240 ml  Output 1200 ml  Net -960 ml   Filed Weights   08/08/24 0330 08/08/24 1300 08/09/24 0313  Weight: 88.8 kg 86.7 kg 87 kg    Examination:  General exam: Appears calm and comfortable AO x 3, chronically ill-appearing Respiratory system: Decreased breath sounds at the bases Cardiovascular system: S1 & S2 heard, RRR.  Abd: nondistended, soft and nontender.Normal bowel sounds heard. Central nervous system: Alert and oriented. No focal neurological deficits. Extremities: Trace edema Skin: No rashes Psychiatry:  Mood & affect appropriate.     Data Reviewed:   CBC: No results for input(s): WBC, NEUTROABS, HGB, HCT, MCV, PLT in the last 168 hours.  Basic Metabolic Panel: Recent Labs  Lab 08/06/24 0235 08/07/24 0243 08/07/24 1004 08/08/24 0430 08/09/24 0358  NA 132* 131* 126* 130* 129*  K 4.4 4.1 3.7 4.4 3.9  CL 91* 91* 89* 89* 89*  CO2 29 29 26 29 28   GLUCOSE 142* 146* 209* 131* 181*  BUN 30* 32* 30* 31* 31*  CREATININE 1.27* 1.27* 1.22* 1.12* 1.18*  CALCIUM  8.8* 8.8* 8.3* 8.8* 8.6*  MG 2.1 2.2 2.0 2.1 2.0   GFR: Estimated Creatinine Clearance: 37.3 mL/min (A) (by C-G formula based on SCr of 1.18 mg/dL (H)). Liver Function Tests: No results for input(s): AST, ALT, ALKPHOS, BILITOT, PROT, ALBUMIN in the last 168 hours. No  results for input(s): LIPASE, AMYLASE in the last 168 hours. No results for input(s): AMMONIA in the last 168 hours. Coagulation Profile: No results for input(s): INR, PROTIME in the last 168 hours. Cardiac Enzymes: No results for input(s): CKTOTAL, CKMB, CKMBINDEX, TROPONINI in the last 168 hours. BNP (last 3 results) Recent Labs    11/21/23 1607  PROBNP 323.0*   HbA1C: No results for  input(s): HGBA1C in the last 72 hours. CBG: Recent Labs  Lab 08/08/24 1018 08/08/24 1238 08/08/24 1716 08/08/24 2132 08/09/24 0725  GLUCAP 179* 216* 90 152* 151*   Lipid Profile: No results for input(s): CHOL, HDL, LDLCALC, TRIG, CHOLHDL, LDLDIRECT in the last 72 hours. Thyroid  Function Tests: No results for input(s): TSH, T4TOTAL, FREET4, T3FREE, THYROIDAB in the last 72 hours. Anemia Panel: No results for input(s): VITAMINB12, FOLATE, FERRITIN, TIBC, IRON, RETICCTPCT in the last 72 hours. Urine analysis:    Component Value Date/Time   COLORURINE STRAW (A) 08/05/2020 2318   APPEARANCEUR CLEAR 08/05/2020 2318   LABSPEC 1.006 08/05/2020 2318   PHURINE 6.0 08/05/2020 2318   GLUCOSEU NEGATIVE 08/05/2020 2318   HGBUR NEGATIVE 08/05/2020 2318   BILIRUBINUR NEGATIVE 08/05/2020 2318   KETONESUR NEGATIVE 08/05/2020 2318   PROTEINUR NEGATIVE 08/05/2020 2318   NITRITE NEGATIVE 08/05/2020 2318   LEUKOCYTESUR NEGATIVE 08/05/2020 2318   Sepsis Labs: @LABRCNTIP (procalcitonin:4,lacticidven:4)  )No results found for this or any previous visit (from the past 240 hours).   Radiology Studies: No results found.   Scheduled Meds:  atorvastatin   20 mg Oral QPM   cholecalciferol   5,000 Units Oral QPM   dofetilide   125 mcg Oral BID   [START ON 08/10/2024] furosemide   80 mg Oral Daily   insulin  aspart  0-15 Units Subcutaneous TID WC   insulin  aspart  0-5 Units Subcutaneous QHS   latanoprost  1 drop Both Eyes QHS   magnesium  oxide  400 mg Oral QPM   metoprolol  succinate  25 mg Oral Daily   Pirfenidone   801 mg Oral TID WC   polyethylene glycol  17 g Oral Daily   potassium chloride   20 mEq Oral Daily   rivaroxaban   20 mg Oral Q supper   sodium chloride  flush  3 mL Intravenous Q12H   sodium chloride  flush  3 mL Intravenous Q12H   spironolactone  25 mg Oral Daily   Continuous Infusions:   LOS: 6 days    Time spent:    Sigurd Pac,  MD [facility]   08/09/2024, 12:26 PM

## 2024-08-10 ENCOUNTER — Other Ambulatory Visit (HOSPITAL_COMMUNITY): Payer: Self-pay

## 2024-08-10 ENCOUNTER — Encounter: Payer: Self-pay | Admitting: Family

## 2024-08-10 DIAGNOSIS — I5033 Acute on chronic diastolic (congestive) heart failure: Secondary | ICD-10-CM | POA: Diagnosis not present

## 2024-08-10 DIAGNOSIS — I480 Paroxysmal atrial fibrillation: Secondary | ICD-10-CM | POA: Diagnosis not present

## 2024-08-10 DIAGNOSIS — Z79899 Other long term (current) drug therapy: Secondary | ICD-10-CM | POA: Diagnosis not present

## 2024-08-10 LAB — MAGNESIUM: Magnesium: 2.3 mg/dL (ref 1.7–2.4)

## 2024-08-10 LAB — BASIC METABOLIC PANEL WITH GFR
Anion gap: 10 (ref 5–15)
BUN: 27 mg/dL — ABNORMAL HIGH (ref 8–23)
CO2: 30 mmol/L (ref 22–32)
Calcium: 8.7 mg/dL — ABNORMAL LOW (ref 8.9–10.3)
Chloride: 88 mmol/L — ABNORMAL LOW (ref 98–111)
Creatinine, Ser: 1.08 mg/dL — ABNORMAL HIGH (ref 0.44–1.00)
GFR, Estimated: 52 mL/min — ABNORMAL LOW (ref >60)
Glucose, Bld: 208 mg/dL — ABNORMAL HIGH (ref 70–99)
Potassium: 4.5 mmol/L (ref 3.5–5.1)
Sodium: 128 mmol/L — ABNORMAL LOW (ref 135–145)

## 2024-08-10 LAB — GLUCOSE, CAPILLARY
Glucose-Capillary: 179 mg/dL — ABNORMAL HIGH (ref 70–99)
Glucose-Capillary: 106 mg/dL — ABNORMAL HIGH (ref 70–99)

## 2024-08-10 MED ORDER — SENNOSIDES-DOCUSATE SODIUM 8.6-50 MG PO TABS
1.0000 | ORAL_TABLET | Freq: Every day | ORAL | 0 refills | Status: AC
  Filled 2024-08-10: qty 30, 30d supply, fill #0

## 2024-08-10 MED ORDER — DOFETILIDE 125 MCG PO CAPS
125.0000 ug | ORAL_CAPSULE | Freq: Two times a day (BID) | ORAL | 1 refills | Status: DC
  Filled 2024-08-10: qty 60, 30d supply, fill #0

## 2024-08-10 MED ORDER — POLYETHYLENE GLYCOL 3350 17 GM/SCOOP PO POWD
17.0000 g | Freq: Every day | ORAL | 0 refills | Status: AC | PRN
  Filled 2024-08-10: qty 238, 14d supply, fill #0

## 2024-08-10 MED ORDER — FUROSEMIDE 40 MG PO TABS
80.0000 mg | ORAL_TABLET | Freq: Every day | ORAL | 1 refills | Status: AC
  Filled 2024-08-10: qty 90, 45d supply, fill #0

## 2024-08-10 MED ORDER — SPIRONOLACTONE 25 MG PO TABS
25.0000 mg | ORAL_TABLET | Freq: Every day | ORAL | 1 refills | Status: AC
  Filled 2024-08-10: qty 30, 30d supply, fill #0

## 2024-08-10 NOTE — Progress Notes (Signed)
 DISCHARGE NOTE HOME Carrie Haynes to be discharged Home per MD order. Discussed prescriptions and follow up appointments with the patient. Prescriptions given to patient; medication list explained in detail. Patient verbalized understanding.  Skin clean, dry and intact without evidence of skin break down, no evidence of skin tears noted. IV catheter discontinued intact. Site without signs and symptoms of complications. Dressing and pressure applied. Pt denies pain at the site currently. No complaints noted.  Patient free of lines, drains, and wounds.   An After Visit Summary (AVS) was printed and given to the patient. Patient escorted via wheelchair, and discharged home via private auto.  Peyton SHAUNNA Pepper, RN

## 2024-08-10 NOTE — Progress Notes (Addendum)
 Pharmacy: Dofetilide  (Tikosyn ) - Follow Up Assessment and Electrolyte Replacement  Pharmacy consulted to assist in monitoring and replacing electrolytes in this 83 y.o. female admitted on 08/01/2024 undergoing dofetilide  re-initiation. First dofetilide  dose: 10/22 pm  Labs:    Component Value Date/Time   K 4.5 08/10/2024 0530   MG 2.3 08/10/2024 0530     Plan: Potassium: 10/24 K fell 4.4>3.9 with po furosemide  10/23 > started KCL 20mEq daily starting 10/24. Now potassium increased 3.9 > 4.5 with furosemide  dec to 80mg  daily from BID.   Likely does not need supplementation at d/c with current furosemide  dose and spironolactone. Will discontinue KCl supplementation.  Magnesium : Mg > 2: No additional supplementation needed - currently on Mag Ox 400mg  daily - continue   Carrie Haynes, PharmD PGY1 Pharmacy Resident [facility] 08/10/2024 8:40 AM

## 2024-08-10 NOTE — Progress Notes (Signed)
  Progress Note  Patient Name: Carrie Haynes Date of Encounter: 08/10/2024 [facility] Cardiologist: Jerel Balding, MD   Interval Summary   No acute overnight events. Patient reports feeling relatively well. No new or acute complaints.   Received 6th dose of Tikosyn  this morning at 0100.   Vital Signs Vitals:   08/10/24 0331 08/10/24 0600 08/10/24 0748 08/10/24 0843  BP: 113/70  116/61 116/61  Pulse: 71  77 71  Resp: 20  15   Temp: 97.8 F (36.6 C)  98.3 F (36.8 C)   TempSrc: Oral  Oral   SpO2: 94%  99%   Weight:  90.3 kg    Height:       No intake or output data in the 24 hours ending 08/10/24 0935    08/10/2024    6:00 AM 08/09/2024    3:13 AM 08/08/2024    1:00 PM  Last 3 Weights  Weight (lbs) 199 lb 1.2 oz 191 lb 14.4 oz 191 lb 3.2 oz  Weight (kg) 90.3 kg 87.045 kg 86.728 kg      Telemetry/ECG  SR - Personally Reviewed  Physical Exam  General: Well developed, in no acute distress.  Neck: No JVD.  Cardiac: Normal rate, regular rhythm.  Resp: Normal work of breathing.  Ext: No edema.  Neuro: No gross focal deficits.  Psych: Normal affect.   Cr 1.08 K 4.5  ECG: Qtc stable at ~411ms  Assessment & Plan   Patient has history of atrial fibrillation.  She was previously on Tikosyn .  Since retirement, patient has altered her sleep/wake cycle.  She awakes around noon each day and goes to bed around 3 in the morning.  Because of this schedule, she was only taking Tikosyn  once daily.  She was admitted and found to be in decompensated heart failure, requiring IV diuresis.  She has now been transitioned to oral diuretics. We discussed extensively the importance of adhering to a regular schedule for Tikosyn  administration with both the patient and her daughter.  Patient voiced understanding.  We will reload her with Tikosyn  on a 1 PM/1 AM schedule.     Problem List: Paroxysmal atrial fibrillation Secondary hypercoagulable state due to atrial  fibrillation High risk medication use   Plan:  - Continue Tikosyn  125 mcg BID. - Continue Xarelto .  EP will sign off.  Signed, Fonda Kitty, MD

## 2024-08-10 NOTE — Discharge Summary (Signed)
 Physician Discharge Summary  Carrie Haynes FMW:[id number] DOB: 25-May-1941 DOA: 08/01/2024  PCP: Teresa Channel, MD  Admit date: 08/01/2024 Discharge date: 08/10/2024  Time spent: 45 minutes  Recommendations for Outpatient Follow-up:  [facility] in 2 weeks, reloading with Tikosyn  completed today PCP in 1 week Outpatient palliative care, continue goals of care discussions   Discharge Diagnoses:  Principal Problem:   Acute on chronic diastolic CHF (congestive heart failure) (HCC) Active Problems:   Hypertension   A-fib (HCC)   IPF (idiopathic pulmonary fibrosis) (HCC)   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/min (HCC)   Type 2 diabetes mellitus with hyperlipidemia (HCC)   Obesity, class 2   PAF (paroxysmal atrial fibrillation) (HCC)   High risk medication use   Discharge Condition: Improved  Diet recommendation: Low-sodium, heart healthy  Filed Weights   08/08/24 1300 08/09/24 0313 08/10/24 0600  Weight: 86.7 kg 87 kg 90.3 kg    History of present illness:  82/F with IPF, chronic respiratory failure on 4 L O2, severe pulmonary hypertension, diastolic CHF, RV failure, paroxysmal A-fib admitted with acute on chronic CHF -10/22, EP following, Tikosyn  reload  Hospital Course:   Acute on chronic diastolic CHF, RV failure Pulmonary hypertension -Last echo with EF 55-60%, grade 1 DD, moderately reduced RV, severely elevated PA systolic pressures, RVSP 92.8, severe TR -Improving with diuresis, 8.4 L negative, cards following now switched to oral Lasix , continue Aldactone and metoprolol  - Weaned down to her 4 L O2 at baseline -Discharged home in stable condition, follow-up with [facility]   Acute on chronic hypoxemic respiratory failure due to acute cardiogenic pulmonary edema, on ILD and pulmonary hypertension. - Improved, now back to 4 L   Paroxysmal A-fib (HCC) -on Tikosyn  previously but only taking once daily, EP following, restarted on Tikosyn  10/22 afternoon, Tikosyn   loading completed, recommended to discharge home on 125 mcg twice daily -Continue metoprolol  and Xarelto  -Follow-up with EP in few weeks   IPF (idiopathic pulmonary fibrosis) (HCC) Continue diuresis  No signs of acute flare.  Continue with perifenidone  She declined bronchodilator therapy.    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/min (HCC) Hyponatremia (baseline serum cr 1,2)  - Stable, continue oral Lasix  at this time   Type 2 diabetes mellitus with hyperlipidemia (HCC) -Stable, SSI   Constipation Continue lactulose, at discharge add Senokot to home meds   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   Discharge Exam: Vitals:   08/10/24 0748 08/10/24 0843  BP: 116/61 116/61  Pulse: 77 71  Resp: 15   Temp: 98.3 F (36.8 C)   SpO2: 99%    Gen: Awake, Alert, Oriented X 3,  HEENT: no JVD Lungs: Good air movement bilaterally, CTAB CVS: S1S2/RRR Abd: soft, Non tender, non distended, BS present Extremities: No edema Skin: no new rashes on exposed skin   Discharge Instructions   Discharge Instructions     Diet - low sodium heart healthy   Complete by: As directed    Diet Carb Modified   Complete by: As directed    Increase activity slowly   Complete by: As directed       Allergies as of 08/10/2024       Reactions   Calcium  Channel Blockers Other (See Comments)   Acute hypotension, patient became brady/asystole < 5 seconds   Estrogens Conjugated Other (See Comments)   Other reaction(s): DVT   Nystatin Hives        Medication List  TAKE these medications    acetaminophen  500 MG tablet Commonly known as: TYLENOL  Take 1,000 mg by mouth every 6 (six) hours as needed for moderate pain (pain score 4-6).   atorvastatin  20 MG tablet Commonly known as: LIPITOR Take 20 mg by mouth every evening.   dofetilide  125 MCG capsule Commonly known as: TIKOSYN  Take 1 capsule (125 mcg total) by mouth 2 (two) times daily.   furosemide  40 MG tablet Commonly known as:  LASIX  Take 2 tablets (80 mg total) by mouth daily. What changed: how much to take   Magnesium  Oxide -Mg Supplement 500 MG Tabs Take 500 mg by mouth every evening.   metFORMIN 500 MG 24 hr tablet Commonly known as: GLUCOPHAGE-XR Take 500 mg by mouth at bedtime.   metoprolol  succinate 25 MG 24 hr tablet Commonly known as: TOPROL -XL Take 1 tablet (25 mg total) by mouth daily.   Pirfenidone  801 MG Tabs Commonly known as: Esbriet  Take 1 tablet (801 mg total) by mouth 3 (three) times daily with meals.   polyethylene glycol powder 17 GM/SCOOP powder Commonly known as: GLYCOLAX/MIRALAX Take 17 g by mouth daily as needed. Dissolve 1 capful (17g) in 4-8 ounces of liquid and take by mouth daily.   rivaroxaban  20 MG Tabs tablet Commonly known as: Xarelto  Take 1 tablet (20 mg total) by mouth daily with supper.   senna-docusate 8.6-50 MG tablet Commonly known as: Senokot-S Take 1 tablet by mouth at bedtime.   spironolactone 25 MG tablet Commonly known as: ALDACTONE Take 1 tablet (25 mg total) by mouth daily. Start taking on: August 11, 2024   Vitamin D -3 125 MCG (5000 UT) Tabs Take 5,000 Units by mouth every evening.       Allergies  Allergen Reactions   Calcium  Channel Blockers Other (See Comments)    Acute hypotension, patient became brady/asystole < 5 seconds   Estrogens Conjugated Other (See Comments)    Other reaction(s): DVT   Nystatin Hives      The results of significant diagnostics from this hospitalization (including imaging, microbiology, ancillary and laboratory) are listed below for reference.    Significant Diagnostic Studies: DG Chest Portable 1 View Result Date: 08/02/2024 EXAM: 1 VIEW(S) XRAY OF THE CHEST 08/02/2024 12:33:06 AM COMPARISON: 04/21/2024 CLINICAL HISTORY: sob. home via [facility] with sob, wears 4LNC, hx of pulmonary fibrosis. Pt reports bilateral leg swelling. FINDINGS: LUNGS AND PLEURA: Chronic coarsened interstitial markings, likely reflecting  chronic interstitial lung disease. No focal pulmonary opacity. No pulmonary edema. No pleural effusion. No pneumothorax. HEART AND MEDIASTINUM: Cardiomegaly. Atherosclerotic calcifications. BONES AND SOFT TISSUES: No acute osseous abnormality. IMPRESSION: 1. Cardiomegaly. 2. Stable chronic interstitial lung disease. Electronically signed by: Pinkie Pebbles MD 08/02/2024 12:45 AM EDT RP Workstation: [id number]    Microbiology: No results found for this or any previous visit (from the past 240 hours).   Labs: Basic Metabolic Panel: Recent Labs  Lab 08/07/24 0243 08/07/24 1004 08/08/24 0430 08/09/24 0358 08/10/24 0530  NA 131* 126* 130* 129* 128*  K 4.1 3.7 4.4 3.9 4.5  CL 91* 89* 89* 89* 88*  CO2 29 26 29 28 30   GLUCOSE 146* 209* 131* 181* 208*  BUN 32* 30* 31* 31* 27*  CREATININE 1.27* 1.22* 1.12* 1.18* 1.08*  CALCIUM  8.8* 8.3* 8.8* 8.6* 8.7*  MG 2.2 2.0 2.1 2.0 2.3   Liver Function Tests: No results for input(s): AST, ALT, ALKPHOS, BILITOT, PROT, ALBUMIN in the last 168 hours. No results for input(s): LIPASE, AMYLASE in the last 168  hours. No results for input(s): AMMONIA in the last 168 hours. CBC: No results for input(s): WBC, NEUTROABS, HGB, HCT, MCV, PLT in the last 168 hours. Cardiac Enzymes: No results for input(s): CKTOTAL, CKMB, CKMBINDEX, TROPONINI in the last 168 hours. BNP: BNP (last 3 results) Recent Labs    04/16/24 1459 04/18/24 0228 08/02/24 0200  BNP 532.1* 466.1* 317.8*    ProBNP (last 3 results) Recent Labs    11/21/23 1607  PROBNP 323.0*    CBG: Recent Labs  Lab 08/09/24 0725 08/09/24 1301 08/09/24 1605 08/09/24 2117 08/10/24 0746  GLUCAP 151* 195* 94 217* 179*       Signed:  Sigurd Pac MD.  [facility] 08/10/2024, 11:18 AM

## 2024-08-10 NOTE — Progress Notes (Signed)
 Assisted Dr. Kennyth in sending in Tikosyn  rx to [facility] pharmacy to ensure pt gets this prior to leaving hospital; Dr. Fairy aware to complete remainder of DC med rec.

## 2024-08-10 NOTE — Plan of Care (Signed)
  Problem: Metabolic: Goal: Ability to maintain appropriate glucose levels will improve Outcome: Progressing   Problem: Nutritional: Goal: Maintenance of adequate nutrition will improve Outcome: Progressing   Problem: Nutritional: Goal: Maintenance of adequate nutrition will improve Outcome: Progressing Goal: Progress toward achieving an optimal weight will improve Outcome: Progressing   Problem: Tissue Perfusion: Goal: Adequacy of tissue perfusion will improve Outcome: Progressing

## 2024-08-10 NOTE — Progress Notes (Signed)
 Pt discharged and taken out of the unit by the SWOT nurse, Peyton Alpers, RN.

## 2024-08-11 DIAGNOSIS — I5033 Acute on chronic diastolic (congestive) heart failure: Secondary | ICD-10-CM | POA: Diagnosis not present

## 2024-08-14 ENCOUNTER — Telehealth: Payer: Self-pay | Admitting: Cardiovascular Disease

## 2024-08-14 DIAGNOSIS — Z5181 Encounter for therapeutic drug level monitoring: Secondary | ICD-10-CM

## 2024-08-14 DIAGNOSIS — Z79899 Other long term (current) drug therapy: Secondary | ICD-10-CM

## 2024-08-14 NOTE — Telephone Encounter (Signed)
 Patient stated she wants a call back directly from Dr. Francyne regarding her dofetilide  (TIKOSYN ) 125 MCG capsule.

## 2024-08-15 ENCOUNTER — Other Ambulatory Visit: Payer: Self-pay | Admitting: Cardiovascular Disease

## 2024-08-15 NOTE — Telephone Encounter (Signed)
 Please remind me on Monday to see if there is any way we can do an ECG on her at home. If we open up Thursday, have her on for a video visit on that day. She did have home health with [facility], not sure it is still active and if they do ECGs. There is a company called [employer] that does home ECG.

## 2024-08-16 ENCOUNTER — Inpatient Hospital Stay (HOSPITAL_COMMUNITY): Admitting: Physician Assistant

## 2024-08-16 DIAGNOSIS — E1169 Type 2 diabetes mellitus with other specified complication: Secondary | ICD-10-CM | POA: Diagnosis not present

## 2024-08-16 DIAGNOSIS — I129 Hypertensive chronic kidney disease with stage 1 through stage 4 chronic kidney disease, or unspecified chronic kidney disease: Secondary | ICD-10-CM | POA: Diagnosis not present

## 2024-08-16 DIAGNOSIS — I5032 Chronic diastolic (congestive) heart failure: Secondary | ICD-10-CM | POA: Diagnosis not present

## 2024-08-16 DIAGNOSIS — I480 Paroxysmal atrial fibrillation: Secondary | ICD-10-CM | POA: Diagnosis not present

## 2024-08-16 DIAGNOSIS — I420 Dilated cardiomyopathy: Secondary | ICD-10-CM | POA: Diagnosis not present

## 2024-08-19 NOTE — Telephone Encounter (Signed)
 Called and left message to call back- left number to the pod I am currently in as well [phone].  (Calling about getting the patient an EKG- see if maybe [facility] can do it or if we can get it done through [facility] mobile [facility])

## 2024-08-19 NOTE — Telephone Encounter (Signed)
 S/w the patient and informed that I spoke with a representative from [facility] mobile and faxed the order for her to receive an EKG in her home. They will be contacting her soon to schedule the EKG. Also gave her the number to the company.  She verbalized understanding.   Informed her that I will call her back to schedule a video visit.

## 2024-08-19 NOTE — Telephone Encounter (Signed)
 Spoke with the patient and made aware that I am going to contact a company to do an EKG in house.

## 2024-08-19 NOTE — Telephone Encounter (Signed)
 Called the patient and made a MyChart visit for 08/21/24 at 9am. Pt verbalized understanding

## 2024-08-20 NOTE — Telephone Encounter (Signed)
 Radiology Exam order form faxed

## 2024-08-21 ENCOUNTER — Encounter: Payer: Self-pay | Admitting: Cardiovascular Disease

## 2024-08-21 ENCOUNTER — Ambulatory Visit: Attending: Cardiovascular Disease | Admitting: Cardiovascular Disease

## 2024-08-21 ENCOUNTER — Ambulatory Visit: Admitting: Physician Assistant

## 2024-08-21 VITALS — BP 118/61 | HR 87 | Ht 60.0 in | Wt 191.0 lb

## 2024-08-21 DIAGNOSIS — D509 Iron deficiency anemia, unspecified: Secondary | ICD-10-CM

## 2024-08-21 DIAGNOSIS — I480 Paroxysmal atrial fibrillation: Secondary | ICD-10-CM

## 2024-08-21 DIAGNOSIS — I5032 Chronic diastolic (congestive) heart failure: Secondary | ICD-10-CM | POA: Diagnosis not present

## 2024-08-21 DIAGNOSIS — D6869 Other thrombophilia: Secondary | ICD-10-CM

## 2024-08-21 DIAGNOSIS — Z5181 Encounter for therapeutic drug level monitoring: Secondary | ICD-10-CM

## 2024-08-21 DIAGNOSIS — I10 Essential (primary) hypertension: Secondary | ICD-10-CM

## 2024-08-21 DIAGNOSIS — I50812 Chronic right heart failure: Secondary | ICD-10-CM

## 2024-08-21 DIAGNOSIS — J9611 Chronic respiratory failure with hypoxia: Secondary | ICD-10-CM

## 2024-08-21 DIAGNOSIS — E1169 Type 2 diabetes mellitus with other specified complication: Secondary | ICD-10-CM

## 2024-08-21 DIAGNOSIS — I2721 Secondary pulmonary arterial hypertension: Secondary | ICD-10-CM

## 2024-08-21 DIAGNOSIS — E119 Type 2 diabetes mellitus without complications: Secondary | ICD-10-CM

## 2024-08-21 DIAGNOSIS — E871 Hypo-osmolality and hyponatremia: Secondary | ICD-10-CM

## 2024-08-21 DIAGNOSIS — I2781 Cor pulmonale (chronic): Secondary | ICD-10-CM

## 2024-08-21 MED ORDER — DOFETILIDE 125 MCG PO CAPS: 125.0000 ug | ORAL_CAPSULE | Freq: Two times a day (BID) | ORAL | 11 refills | Status: AC

## 2024-08-21 NOTE — Patient Instructions (Signed)
 Medication Instructions:  No changes *If you need a refill on your cardiac medications before your next appointment, please call your pharmacy*  Lab Work: None ordered If you have labs (blood work) drawn today and your tests are completely normal, you will receive your results only by: MyChart Message (if you have MyChart) OR A paper copy in the mail If you have any lab test that is abnormal or we need to change your treatment, we will call you to review the results.  Testing/Procedures: None ordered  Follow-Up: At [facility], you and your health needs are our priority.  As part of our continuing mission to provide you with exceptional heart care, our providers are all part of one team.  This team includes your primary Cardiologist (physician) and Advanced Practice Providers or APPs (Physician Assistants and Nurse Practitioners) who all work together to provide you with the care you need, when you need it.  Your next appointment:   6 month(s)  Provider:   Jerel Balding, MD    We recommend signing up for the patient portal called MyChart.  Sign up information is provided on this After Visit Summary.  MyChart is used to connect with patients for Virtual Visits (Telemedicine).  Patients are able to view lab/test results, encounter notes, upcoming appointments, etc.  Non-urgent messages can be sent to your provider as well.   To learn more about what you can do with MyChart, go to [email or web address].

## 2024-08-21 NOTE — Progress Notes (Signed)
 Spoke with the patient after the MyChart visit was finished. Informed that she will receive a reminder to make her 6 month follow up appointment around February, and to call us  at that time to get it scheduled. Sent in a refill for Tikosyn  to her preferred pharmacy. Also informed her that Dr Francyne has received and reviewed the EKG and it looks good. She verbalized understanding of all information.

## 2024-08-21 NOTE — Progress Notes (Signed)
 I connected with  Carrie Haynes on 08/21/24 by a video enabled telemedicine application and verified that I am speaking with the correct person using two identifiers.   I discussed the limitations of evaluation and management by telemedicine. The patient expressed understanding and agreed to proceed.

## 2024-08-21 NOTE — Progress Notes (Signed)
 Virtual Visit via Video Note   Because of CLEVA CAMERO co-morbid illnesses, she is at least at moderate risk for complications without adequate follow up.  This format is felt to be most appropriate for this patient at this time.  All issues noted in this document were discussed and addressed.  A limited physical exam was performed with this format.  Please refer to the patient's chart for her consent to telehealth for [facility].       Date:  08/21/2024   ID:  Carrie Haynes, DOB [birth year], MRN [id number] The patient was identified using 2 identifiers.  Patient Location: Home Provider Location: Office/Clinic   PCP:  Teresa Channel, MD   [facility] Providers Cardiologist:  Jerel Balding, MD     Evaluation Performed:  Follow-Up Visit  Chief Complaint: Recent hospitalization follow-up   History of Present Illness:    Carrie Haynes is a 83 y.o. 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  She has severe pulmonary artery hypertension primarily due to [facility] group 3, possibly also group 4 mechanism (by cardiac catheterization in September 2024 70/10 mmHg in the setting of a normal PAWP of 10 mmHg and low normal cardiac index 2.2 L/min/m).  She is essentially housebound since her breathing problems are so severe.  She asked to make this a video visit rather than an in office visit.  She feels okay as long as she is sitting down, but even walk around the house makes her exhausted and causes desaturation.  She does not have orthopnea or PND and currently does not have any leg edema.    She was hospitalized again 10/17 - 08/10/2024 with severe dyspnea and hypoxia.  She was treated with medications for both  lung disease and heart failure.  She was kept in the hospital longer since she had to restart treatment with dofetilide  after stating that she was only taking it once daily.  There were no problems with that and she is now on dofetilide  125 mcg twice daily and has continued the Xarelto  anticoagulation.  Towards the end of her hospital admission she weighed 191 pounds (the recorded weight on 08/10/2024 is probably erroneous) and on her home scale today she weighs 191 pounds.  Right heart catheterization in September 2024 demonstrated that her pulmonary hypertension is entirely due to precapillary causes (mean PA pressure 42 mmHg, mean PAWP 10 mmHg, cardiac index 2.2 L/min/m, PVR 7.5 Woods units, PAPi 11.8).  At that time she weighed 209 pounds  We had an ECG performed yesterday by a home health agency [facility]) and it shows sinus rhythm with premature atrial contractions and a QTc in acceptable range.  She was previously hospitalized July 1 - April 22, 2024 with shortness of breath and lower extremity edema.  She was in normal sinus rhythm.  BNP was elevated at 532.  Echo again showed normal LVEF 55 to 60% and only grade 1 diastolic dysfunction.  She received initially intravenous then oral diuretics.  She had an aspiration event during the hospitalization, desaturating down to 70% range.  She underwent a modified barium swallow that showed evidence of potential esophageal dysphagia/dysmotility.  Follow-up with GI was recommended.  Mild hyponatremia 130 was noted during the hospitalization.  QTc on her ECG was 482 ms.  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  Previous pulmonary function tests have shown mild-moderate reduction in FVC (65-75% of predicted, DLCO corrects for alveolar volume).  She did receive amiodarone  for a couple of months in 2018.    Initially diagnosed with atrial fibrillation with rapid ventricular response in July 2018 when she presented with tachycardia  cardiomyopathy.  Her echo showed mildly depressed LVEF at 45% with diffuse hypokinesis and moderate mitral regurgitation, mildly dilated left atrium, but also a moderately dilated right atrium with moderate to severe tricuspid regurgitation and mild pulmonary hypertension.  Coronary angiography showed no evidence of significant CAD.  Rate control was difficult.  She underwent cardioversion on August 2 unsuccessfully, but eventually after loading with IV amiodarone  was successfully cardioverted on August 7. 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  Amiodarone  was stopped in November 2018. Follow-up echocardiogram shows complete normalization of left ventricular systolic function, EF 55-60%, with some residual signs of impaired relaxation.  A CT of the chest performed in July 2020 did show evidence of mild-moderate pulmonary fibrosis, but this was unchanged from the previous CT in 2018.    In November 2020 she presented with recurrent shortness of breath and was found to again have atrial fibrillation.  She was in rapid ventricular response but was unaware of the arrhythmia.  Echocardiography shows her EF has again dropped to 45-50%.  She was hospitalized for dofetilide  loading, converted to sinus rhythm without electrical cardioversion during antiarrhythmic administration.     Allergies:   Calcium  channel blockers, Estrogens conjugated, and Nystatin     Recent Labs: 11/21/2023: Pro B Natriuretic peptide (BNP) 323.0 04/16/2024: ALT 20 08/02/2024: B Natriuretic Peptide 317.8; Hemoglobin 10.7; Platelets 212 08/10/2024: BUN 27; Creatinine, Ser 1.08; Magnesium  2.3; Potassium 4.5; Sodium 128      12/12/2022 Cholesterol 181, HDL 65, LDL 99, triglycerides 97, hemoglobin A1c 7.0% 06/26/2023 hemoglobin A1c 6.8%, hemoglobin 11.6, creatinine 1.0, potassium 4.8  Studies Reviewed: SABRA    EKG Interpretation ECG from [facility] 08/20/2024 shows sinus rhythm with occasional PACs, left axis deviation, incomplete right bundle branch block and QTc 470 ms   Risk Assessment/Calculations:    CHA2DS2-VASc Score = 6   This indicates a 9.7% annual risk of stroke. The patient's score is based upon: CHF History: 1 HTN History: 1 Diabetes History: 1 Stroke History: 0 Vascular Disease History: 0 Age Score: 2 Gender Score: 1     Physical Exam:    VS:  BP 118/61 (BP Location: Left Arm, Patient Position: Sitting)   Pulse 87   Ht 5' (1.524 m)   Wt 191 lb (86.6 kg) Comment: Last weighed a couple days ago  BMI 37.30 kg/m     Wt Readings from Last 3 Encounters:  08/21/24 191 lb (86.6 kg)  08/10/24 199 lb 1.2 oz (90.3 kg)  06/13/24 195 lb (88.5 kg)    Recent Labs: 11/21/2023: Pro B Natriuretic peptide (BNP) 323.0 04/16/2024: ALT 20 04/18/2024: B Natriuretic Peptide 466.1 04/22/2024: BUN 16; Creatinine, Ser 0.75; Hemoglobin 10.5; Magnesium  1.9; Platelets 245; Potassium 3.7; Sodium 127   Recent Lipid Panel Lab Results  Component Value Date/Time   CHOL 95 04/19/2024 02:13 AM   CHOL 165 10/10/2019 10:25 AM   TRIG 60 04/19/2024 02:13 AM   HDL 36 (L) 04/19/2024 02:13 AM   HDL 52 10/10/2019 10:25 AM   CHOLHDL 2.6 04/19/2024 02:13 AM   LDLCALC 47  04/19/2024 02:13 AM   LDLCALC 95 10/10/2019 10:25 AM   Filed Weights   08/21/24 0910  Weight: 191 lb (86.6 kg)    Objective:    BP 118/61 (BP Location: Left Arm, Patient Position: Sitting)   Pulse 87   Ht 5' (1.524 m)   Wt 191 lb (86.6 kg) Comment: Last weighed a couple days ago  BMI 37.30 kg/m   Limited physical exam during video visit shows no evidence of cyanosis, no evidence of jugular venous distention, no edema, unlabored respirations at rest    ASSESSMENT:    1. PAF (paroxysmal atrial fibrillation) (HCC)   2. Acquired thrombophilia   3. Chronic right heart failure (HCC)   4. Cor pulmonale, chronic (HCC)   5.  PAH (pulmonary artery hypertension) (HCC)   6. Hyponatremia   7. Chronic respiratory failure with hypoxia (HCC)   8. Essential hypertension   9. Severe obesity (BMI 35.0-39.9) with comorbidity (HCC)   10. Diabetes mellitus without complication (HCC)   11. Type 2 diabetes mellitus with hyperlipidemia (HCC)   12. Encounter for monitoring dofetilide  therapy   13. Iron deficiency anemia, unspecified iron deficiency anemia type        PLAN:    In order of problems listed above:  AFib: In normal rhythm on dofetilide  and metoprolol .   No history of stroke/TIA, but CHA2DS2-VASc 5 (age 38, gender, hypertension, history of CHF).   Anticoagulation: Compliant with Xarelto  without any bleeding complications. CHF: As far as I can tell she primarily has had edema due to right heart failure exacerbation.  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  She really does not have much to support a diagnosis of left ventricular diastolic dysfunction.  She did have tachycardia cardiomyopathy related heart failure in the past, but this has resolved completely.    Recent heart catheterization showed marked elevation in PA pressure despite excellent left heart filling pressures.  Reset her dry weight at 191 pounds.  Continue daily weight monitoring.  Functional status is impaired by pulmonary problems.  Cardiac output is limited by pulmonary hypertension and right heart dysfunction.  Continue the current doses of furosemide  and spironolactone.  Recheck labs periodically. Hyponatremia: This is primarily a sign of the severity of her chronic pulmonary disease and right heart failure with limited cardiac output.  It can be worsened by overdiuresis. PAH/chronic cor pulmonale: As far as I can tell this is entirely due to precapillary arteriolar disease, mostly WHO group 3, may be a component of WHO group 4 following previous pulmonary embolism.  Left heart failure  is not a significant contributing factor.  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   Remains on treatment with pirfenidone .  Clearly some part of her restriction is due to her obesity, but high-resolution CT also showed marked progression of widespread areas of groundglass attenuation and evidence of fibrosis, without frank honeycombing.   HTN: Well-controlled today.  She is really not on any true antihypertensives but takes spironolactone to help with loop diuretic related hypokalemia. Obesity: She has lost some weight and is now only a severely obese range. DM: Acceptable control, most recent hemoglobin A1c 7.3%. HLP: Most recent lipid profile from July shows excellent LDL at  47 and a low HDL at 36.  She is unable to increase her level of physical activity.  She has lost a little weight gradually. Dofetilide  monitoring: QTc in appropriate range.  Understands that we need to check this at least every 6 months.  We can use the same home health company to recheck her ECG in about 6 months.  Labs during hospitalization showed potassium and renal function were normal.  Reviewed the importance of being aware of potential drug interactions with this medication. Anemia: Chronic normocytic normochromic, stable around 10-10.5.  Most recent iron studies actually showed normal findings with saturation 26% and ferritin 42, but this was a year ago           Time:   Today, I have spent 25 minutes with the patient with telehealth technology discussing the above problems.   Dispo: Check weight daily and call if it exceeds 191 pounds.  F/u 6 months  Signed, Jerel Balding, MD

## 2024-09-09 DIAGNOSIS — I480 Paroxysmal atrial fibrillation: Secondary | ICD-10-CM | POA: Diagnosis not present

## 2024-09-09 DIAGNOSIS — E1169 Type 2 diabetes mellitus with other specified complication: Secondary | ICD-10-CM | POA: Diagnosis not present

## 2024-09-09 DIAGNOSIS — I420 Dilated cardiomyopathy: Secondary | ICD-10-CM | POA: Diagnosis not present

## 2024-09-09 DIAGNOSIS — I5032 Chronic diastolic (congestive) heart failure: Secondary | ICD-10-CM | POA: Diagnosis not present

## 2024-09-10 ENCOUNTER — Ambulatory Visit: Admitting: Physician Assistant

## 2024-09-15 DIAGNOSIS — E1169 Type 2 diabetes mellitus with other specified complication: Secondary | ICD-10-CM | POA: Diagnosis not present

## 2024-09-15 DIAGNOSIS — I5032 Chronic diastolic (congestive) heart failure: Secondary | ICD-10-CM | POA: Diagnosis not present

## 2024-09-15 DIAGNOSIS — I420 Dilated cardiomyopathy: Secondary | ICD-10-CM | POA: Diagnosis not present

## 2024-09-15 DIAGNOSIS — I480 Paroxysmal atrial fibrillation: Secondary | ICD-10-CM | POA: Diagnosis not present

## 2024-09-15 DIAGNOSIS — I129 Hypertensive chronic kidney disease with stage 1 through stage 4 chronic kidney disease, or unspecified chronic kidney disease: Secondary | ICD-10-CM | POA: Diagnosis not present

## 2024-10-28 ENCOUNTER — Encounter: Payer: Self-pay | Admitting: Family

## 2024-10-28 ENCOUNTER — Other Ambulatory Visit (HOSPITAL_COMMUNITY): Payer: Self-pay

## 2024-10-28 ENCOUNTER — Other Ambulatory Visit: Payer: Self-pay

## 2024-10-28 ENCOUNTER — Other Ambulatory Visit: Payer: Self-pay | Admitting: Pulmonary Disease

## 2024-10-28 ENCOUNTER — Telehealth: Payer: Self-pay

## 2024-10-28 DIAGNOSIS — Z5181 Encounter for therapeutic drug level monitoring: Secondary | ICD-10-CM

## 2024-10-28 DIAGNOSIS — J849 Interstitial pulmonary disease, unspecified: Secondary | ICD-10-CM

## 2024-10-28 NOTE — Telephone Encounter (Signed)
 Received notification via specialty pharmacy encounter that pt inquired about grant for pirfenidone  this year. Pt enrolled in pulmonary fibrosis grant through PAF:  Amount: $5000 Award Period: 05/01/24 - 10/28/25 BIN: [id number] PCN: [id number] Group: [id number] ID: [id number]  For pharmacy inquiries, contact PDMI at [phone]. For patient inquiries, contact PAF at [phone].

## 2024-10-28 NOTE — Progress Notes (Signed)
 Specialty Pharmacy Refill Coordination Note  Carrie Haynes is a 84 y.o. female contacted today regarding refills of specialty medication(s) Pirfenidone    Patient requested Delivery   Delivery date: 10/31/24   Verified address: [address] [place], [place] [address]   Medication will be filled on: 10/30/24

## 2024-10-28 NOTE — Telephone Encounter (Unsigned)
 Copied from CRM #[id number]. Topic: Clinical - Medication Refill >> Oct 28, 2024 12:28 PM LaVerne A wrote: Medication: Pirfenidone  (ESBRIET ) 801 MG TABS  Has the patient contacted their pharmacy? No, didn't know the number to call.  Has it now. (Agent: If no, request that the patient contact the pharmacy for the refill. If patient does not wish to contact the pharmacy document the reason why and proceed with request.) (Agent: If yes, when and what did the pharmacy advise?)  This is the patient's preferred pharmacy:  [facility] [address] [place] [address] Phone: [phone] Fax: [phone] Hours: Mon-Fri 7:30a-7p; Sat 8a-4:30p; [place] 10a-2p  Is this the correct pharmacy for this prescription? Yes If no, delete pharmacy and type the correct one.   Has the prescription been filled recently? Yes  Is the patient out of the medication? No  Has the patient been seen for an appointment in the last year OR does the patient have an upcoming appointment? Yes  Can we respond through MyChart? No  Agent: Please be advised that Rx refills may take up to 3 business days. We ask that you follow-up with your pharmacy.

## 2024-10-28 NOTE — Progress Notes (Signed)
 Pt has ben re-enrolled in grant, updated info in [place].

## 2024-10-30 ENCOUNTER — Encounter: Payer: Self-pay | Admitting: Pulmonary Disease

## 2024-10-30 ENCOUNTER — Other Ambulatory Visit: Payer: Self-pay

## 2024-10-30 MED ORDER — PIRFENIDONE 801 MG PO TABS
801.0000 mg | ORAL_TABLET | Freq: Three times a day (TID) | ORAL | 0 refills | Status: AC
  Filled 2024-10-30: qty 270, 90d supply, fill #0
  Filled 2024-11-22: qty 90, 30d supply, fill #0

## 2024-10-30 NOTE — Telephone Encounter (Signed)
 Unable to reach patient, mail a letter to contact the office to set up appointment.

## 2024-10-30 NOTE — Telephone Encounter (Signed)
 Refill sent for ESBRIET  to [facility]: [phone]   Dose: 801mg  TID  Last OV: 06/13/24 Provider: Dr. Theophilus Pertinent labs: LFTs last obtained July 2025 per available records  Next OV: overdue - due Nov 2025  Recommend updating LFTs. MyChart message sent and orders entered.  Routing to scheduling team for follow-up on appt scheduling  Aleck Puls, PharmD, BCPS Clinical Pharmacist  [facility]

## 2024-11-22 ENCOUNTER — Telehealth: Payer: Self-pay

## 2024-11-22 ENCOUNTER — Other Ambulatory Visit: Payer: Self-pay

## 2024-11-22 ENCOUNTER — Other Ambulatory Visit (HOSPITAL_COMMUNITY): Payer: Self-pay

## 2024-11-22 NOTE — Telephone Encounter (Signed)
 Received notification via specialty pharmacy encounter that pirfenidone  requires pa. Submitted a Prior Authorization request to [facility] for PIRFENIDONE  via CoverMyMeds. Will update once we receive a response.  Key: [id number]

## 2025-01-09 ENCOUNTER — Ambulatory Visit: Admitting: Pulmonary Disease
